# Patient Record
Sex: Male | Born: 1945 | Race: White | Hispanic: No | Marital: Married | Smoking: Never smoker
Health system: Southern US, Community
[De-identification: ages and names within clinical notes are randomized; demographics above are authoritative.]

## PROBLEM LIST (undated history)

## (undated) DIAGNOSIS — I10 Essential (primary) hypertension: Secondary | ICD-10-CM

## (undated) DIAGNOSIS — C801 Malignant (primary) neoplasm, unspecified: Secondary | ICD-10-CM

## (undated) DIAGNOSIS — E119 Type 2 diabetes mellitus without complications: Secondary | ICD-10-CM

## (undated) DIAGNOSIS — M549 Dorsalgia, unspecified: Secondary | ICD-10-CM

---

## 2012-08-20 ENCOUNTER — Emergency Department (HOSPITAL_COMMUNITY): Payer: Medicare Other

## 2012-08-20 ENCOUNTER — Encounter (HOSPITAL_COMMUNITY): Payer: Self-pay | Admitting: Emergency Medicine

## 2012-08-20 ENCOUNTER — Inpatient Hospital Stay (HOSPITAL_COMMUNITY)
Admission: EM | Admit: 2012-08-20 | Discharge: 2012-08-28 | DRG: 840 | Disposition: A | Discharge status: Home / Self Care | Payer: Medicare Other | Attending: Internal Medicine | Admitting: Internal Medicine

## 2012-08-20 DIAGNOSIS — D638 Anemia in other chronic diseases classified elsewhere: Secondary | ICD-10-CM | POA: Diagnosis present

## 2012-08-20 DIAGNOSIS — R7402 Elevation of levels of lactic acid dehydrogenase (LDH): Secondary | ICD-10-CM | POA: Diagnosis present

## 2012-08-20 DIAGNOSIS — R531 Weakness: Secondary | ICD-10-CM | POA: Diagnosis present

## 2012-08-20 DIAGNOSIS — R29898 Other symptoms and signs involving the musculoskeletal system: Secondary | ICD-10-CM | POA: Diagnosis present

## 2012-08-20 DIAGNOSIS — IMO0001 Reserved for inherently not codable concepts without codable children: Secondary | ICD-10-CM | POA: Diagnosis present

## 2012-08-20 DIAGNOSIS — R7401 Elevation of levels of liver transaminase levels: Secondary | ICD-10-CM | POA: Diagnosis present

## 2012-08-20 DIAGNOSIS — R627 Adult failure to thrive: Secondary | ICD-10-CM | POA: Diagnosis present

## 2012-08-20 DIAGNOSIS — R000 Tachycardia, unspecified: Secondary | ICD-10-CM | POA: Diagnosis present

## 2012-08-20 DIAGNOSIS — C8589 Other specified types of non-Hodgkin lymphoma, extranodal and solid organ sites: Principal | ICD-10-CM | POA: Diagnosis present

## 2012-08-20 DIAGNOSIS — I498 Other specified cardiac arrhythmias: Secondary | ICD-10-CM | POA: Diagnosis present

## 2012-08-20 DIAGNOSIS — E876 Hypokalemia: Secondary | ICD-10-CM | POA: Diagnosis not present

## 2012-08-20 DIAGNOSIS — E041 Nontoxic single thyroid nodule: Secondary | ICD-10-CM | POA: Diagnosis present

## 2012-08-20 DIAGNOSIS — E119 Type 2 diabetes mellitus without complications: Secondary | ICD-10-CM

## 2012-08-20 DIAGNOSIS — I420 Dilated cardiomyopathy: Secondary | ICD-10-CM | POA: Diagnosis present

## 2012-08-20 DIAGNOSIS — D630 Anemia in neoplastic disease: Secondary | ICD-10-CM | POA: Diagnosis present

## 2012-08-20 DIAGNOSIS — C8590 Non-Hodgkin lymphoma, unspecified, unspecified site: Secondary | ICD-10-CM

## 2012-08-20 DIAGNOSIS — I428 Other cardiomyopathies: Secondary | ICD-10-CM | POA: Diagnosis present

## 2012-08-20 DIAGNOSIS — I10 Essential (primary) hypertension: Secondary | ICD-10-CM | POA: Diagnosis present

## 2012-08-20 DIAGNOSIS — R5381 Other malaise: Secondary | ICD-10-CM | POA: Diagnosis present

## 2012-08-20 DIAGNOSIS — E871 Hypo-osmolality and hyponatremia: Secondary | ICD-10-CM | POA: Diagnosis present

## 2012-08-20 DIAGNOSIS — C799 Secondary malignant neoplasm of unspecified site: Secondary | ICD-10-CM | POA: Diagnosis present

## 2012-08-20 DIAGNOSIS — E1165 Type 2 diabetes mellitus with hyperglycemia: Secondary | ICD-10-CM | POA: Diagnosis present

## 2012-08-20 DIAGNOSIS — E43 Unspecified severe protein-calorie malnutrition: Secondary | ICD-10-CM | POA: Diagnosis present

## 2012-08-20 HISTORY — DX: Essential (primary) hypertension: I10

## 2012-08-20 HISTORY — DX: Dorsalgia, unspecified: M54.9

## 2012-08-20 HISTORY — DX: Type 2 diabetes mellitus without complications: E11.9

## 2012-08-20 LAB — CBC WITH DIFFERENTIAL/PLATELET
Basophils Absolute: 0 10*3/uL (ref 0.0–0.1)
Basophils Relative: 0 % (ref 0–1)
Eosinophils Absolute: 0 10*3/uL (ref 0.0–0.7)
Eosinophils Relative: 0 % (ref 0–5)
HCT: 30.2 % — ABNORMAL LOW (ref 39.0–52.0)
MCHC: 32.8 g/dL (ref 30.0–36.0)
MCV: 83.4 fL (ref 78.0–100.0)
Monocytes Absolute: 0.4 10*3/uL (ref 0.1–1.0)
RDW: 13.5 % (ref 11.5–15.5)

## 2012-08-20 LAB — CG4 I-STAT (LACTIC ACID): Lactic Acid, Venous: 1.88 mmol/L (ref 0.5–2.2)

## 2012-08-20 LAB — COMPREHENSIVE METABOLIC PANEL
AST: 155 U/L — ABNORMAL HIGH (ref 0–37)
Albumin: 2.1 g/dL — ABNORMAL LOW (ref 3.5–5.2)
Calcium: 9.5 mg/dL (ref 8.4–10.5)
Creatinine, Ser: 1.01 mg/dL (ref 0.50–1.35)
GFR calc non Af Amer: 76 mL/min — ABNORMAL LOW (ref >90)

## 2012-08-20 MED ORDER — GADOBENATE DIMEGLUMINE 529 MG/ML IV SOLN
15.0000 mL | Freq: Once | INTRAVENOUS | Status: AC | PRN
  Administered 2012-08-20: 15 mL via INTRAVENOUS

## 2012-08-20 MED ORDER — INSULIN ASPART 100 UNIT/ML ~~LOC~~ SOLN: 0.0000 [IU] | Freq: Three times a day (TID) | SUBCUTANEOUS | Status: DC

## 2012-08-20 MED ORDER — SODIUM CHLORIDE 0.9 % IV BOLUS (SEPSIS)
1000.0000 mL | Freq: Once | INTRAVENOUS | Status: AC
  Administered 2012-08-20: 1000 mL via INTRAVENOUS

## 2012-08-20 NOTE — ED Notes (Signed)
Patient transported to MRI 

## 2012-08-20 NOTE — Progress Notes (Signed)
   CARE MANAGEMENT ED NOTE 08/20/2012  Patient:  [facility]   Account Number:  [id number]  Date Initiated:  08/20/2012  Documentation initiated by:  Radford Pax  Subjective/Objective Assessment:   Patient presents to ED with elevated blood sygar, back pain and decreased appetite.     Subjective/Objective Assessment Detail:     Action/Plan:   Action/Plan Detail:   Anticipated DC Date:       Status Recommendation to Physician:   Result of Recommendation:    Other ED Services  Consult Working Plan    DC Planning Services  Other  PCP issues    Choice offered to / List presented to:            Status of service:  Completed, signed off  ED Comments:   ED Comments Detail:  Patient currently in MRI.  Patient's family at bedside report that Dr. Jeanie Sewer is patient's pcp.

## 2012-08-20 NOTE — ED Provider Notes (Addendum)
History    CSN: [id number] Arrival date & time 08/20/12  1904  First MD Initiated Contact with Patient 08/20/12 1926     Chief Complaint  Patient presents with  . Weakness   (Consider location/radiation/quality/duration/timing/severity/associated sxs/prior Treatment) HPI Comments: Patient states approximately one month ago he was working in the garden and developed lower back pain. The pain continued to worsen so he saw his doctor and made June and was placed on prednisone and pain medication. He states the prednisone caused her sugars to go crazy and since that time he has had difficulty controlling them. He has been back to his regular doctor approximately one to 2 times a week since that time with ongoing back pain, weight loss, generalized weakness and severe fatigue. He still has 9/10 back pain with mild weakness in the left leg. He states prior to this he was able to do everything he wanted to do and was strong and healthy and now he can't walk across the room without feeling so weak he needs to rest. He went to an outside emergency room approximately 2 weeks ago hemicolectomy was to hydrated and had problems with his kidneys and liver. At that time he had a plain film done which was unrevealing but did not have an MRI. He had an ultrasound of his liver and kidneys done yesterday which was normal but continues to have worsening symptoms and came here for further help. He states he's lost 40 pounds in the last one month with anorexia and decreased appetite as well as chills. He has a prior history of non-Hodgkin lymphoma, back surgery in the 90s and diabetes. Wife states last week he noted his heart rate started to become elevated and his chest worsened until today  Patient is a 67 y.o. male presenting with weakness. The history is provided by the patient and the spouse.  Weakness This is a new problem. Episode onset: over the last 3 weeks. The problem occurs constantly. The problem has been  gradually worsening. Pertinent negatives include no chest pain, no abdominal pain and no shortness of breath. The symptoms are aggravated by walking and bending. Nothing relieves the symptoms. He has tried rest, acetaminophen and water (meds from PCP) for the symptoms. The treatment provided no relief.   Past Medical History  Diagnosis Date  . Back pain   . Hypertension   . Diabetes mellitus without complication    History reviewed. No pertinent past surgical history. History reviewed. No pertinent family history. History  Substance Use Topics  . Smoking status: Never Smoker   . Smokeless tobacco: Not on file  . Alcohol Use: No    Review of Systems  Constitutional: Positive for chills, appetite change, fatigue and unexpected weight change. Negative for fever.  Respiratory: Negative for cough and shortness of breath.   Cardiovascular: Negative for chest pain.  Gastrointestinal: Negative for nausea, vomiting, abdominal pain and diarrhea.  Genitourinary: Negative for dysuria.  Neurological: Positive for weakness. Negative for numbness.  All other systems reviewed and are negative.    Allergies  Review of patient's allergies indicates not on file.  Home Medications  No current outpatient prescriptions on file. BP 102/76  Pulse 138  Temp(Src) 98.5 F (36.9 C) (Oral)  Resp 18  Wt 166 lb (75.297 kg)  SpO2 98% Physical Exam  Nursing note and vitals reviewed. Constitutional: He is oriented to person, place, and time. He appears well-developed. He appears cachectic. No distress.  HENT:  Head: Normocephalic and atraumatic.  Mouth/Throat: Oropharynx is clear and moist.  Eyes: Conjunctivae and EOM are normal. Pupils are equal, round, and reactive to light.  Neck: Normal range of motion. Neck supple.  Cardiovascular: Regular rhythm and intact distal pulses.  Tachycardia present.   No murmur heard. Pulmonary/Chest: Effort normal and breath sounds normal. No respiratory distress. He  has no wheezes. He has no rales.  Abdominal: Soft. He exhibits no distension. There is no tenderness. There is no rebound and no guarding.  Musculoskeletal: Normal range of motion. He exhibits tenderness. He exhibits no edema.       Lumbar back: He exhibits tenderness and pain. He exhibits no deformity, no laceration and normal pulse.       Back:  Significant point tenderness over the lower t-spine and upper l-spine  Neurological: He is alert and oriented to person, place, and time. No sensory deficit.  4/5 strength of the left lower ext compared to 5/5 on the right.    Skin: Skin is warm and dry. No rash noted. No erythema.  Psychiatric: He has a normal mood and affect. His behavior is normal.    ED Course  Procedures (including critical care time) Labs Reviewed  CBC WITH DIFFERENTIAL - Abnormal; Notable for the following:    RBC 3.62 (*)    Hemoglobin 9.9 (*)    HCT 30.2 (*)    Neutrophils Relative % 81 (*)    All other components within normal limits  COMPREHENSIVE METABOLIC PANEL - Abnormal; Notable for the following:    Sodium 133 (*)    Glucose, Bld 331 (*)    BUN 24 (*)    Albumin 2.1 (*)    AST 155 (*)    ALT 139 (*)    Alkaline Phosphatase 378 (*)    GFR calc non Af Amer 76 (*)    GFR calc Af Amer 88 (*)    All other components within normal limits  CK  URINALYSIS, ROUTINE W REFLEX MICROSCOPIC  CG4 I-STAT (LACTIC ACID)   Mr Thoracic Spine W Wo Contrast  08/20/2012   *RADIOLOGY REPORT*  Clinical Data:  Back pain  MRI THORACIC AND LUMBAR SPINE WITHOUT AND WITH CONTRAST  Technique:  Multiplanar and multiecho pulse sequences of the thoracic and lumbar spine were obtained without and with intravenous contrast.  Contrast: 15mL MULTIHANCE GADOBENATE DIMEGLUMINE 529 MG/ML IV SOLN  Comparison:   None.  MRI THORACIC SPINE  Findings: The vertebral bodies are normally aligned with preservation of the normal thoracic kyphosis.  There is no listhesis.  Vertebral body heights are  preserved.  Signal intensity throughout the visualized vertebral body bone marrow is diffusely abnormal with a mottled T1 hypointense, T2 hyperintense appearance.  There is diffuse patchy heterogeneous enhancement seen throughout all the visualized bone marrow. Findings are most consistent with diffuse osseous metastases. No epidural extension of tumor is seen. A single discrete lesion is difficult to measure and demarcated.  There are no epidural collections.  No significant canal or neural foraminal impingement is identified. No pathologic fracture.  Incidental note is made of an exophytic right renal cyst.   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  MRI LUMBAR SPINE  Findings: The vertebral bodies are normally aligned with preservation of the normal lumbar lordosis.  There is no listhesis. Vertebral body heights are preserved.  Signal intensity throughout the visualized vertebral body bone marrow is diffusely abnormal with a mild T1 hypointense, T2 hyperintense appearance.  There is diffuse patchy heterogeneous enhancement on  post contrast sequences.  Findin similar changes are seen within the bones of the pelvis.  gs are consistent with diffuse osseous metastases.  It is difficult to measure a single discrete lesion, however, enhancement is most evident within the L3 vertebral body. No epidural tumor.  No pathologic fracture.  At L5-S1, postoperative changes from prior right hemilaminectomy are seen.  There appears to be a small amount of enhancing scar tissue along the right anterolateral aspect of the thecal sac not significant canal impingement.  There is diffuse degenerative disc bulge with disc desiccation and bilateral facet arthrosis at this level. No evident recurrent disc herniation.  There is resultant moderate bilateral foraminal narrowing.  No other significant disc disease identified within the lumbar spine.  IMPRESSION:  1.  Diffuse abnormal heterogeneous signal  intensity throughout the vertebral body bone marrow with heterogeneous enhancement, worrisome for diffuse osseous metastases. 2. Postsurgical changes from prior right hemilaminectomy at L5-S1 with a small amount of enhancing scar tissue formation along the right lateral aspect of the thecal sac.  Degenerative disc disease at L5-S1 as above without recurrent focal disc herniation.  Critical Value/emergent results were called by telephone at the time of interpretation on 08/20/2012 at 10:50 p.m. to [facility], who verbally acknowledged these results.   Original Report Authenticated By: Rise Mu, M.D.   Mr Lumbar Spine W Wo Contrast  08/20/2012   *RADIOLOGY REPORT*  Clinical Data:  Back pain  MRI THORACIC AND LUMBAR SPINE WITHOUT AND WITH CONTRAST  Technique:  Multiplanar and multiecho pulse sequences of the thoracic and lumbar spine were obtained without and with intravenous contrast.  Contrast: 15mL MULTIHANCE GADOBENATE DIMEGLUMINE 529 MG/ML IV SOLN  Comparison:   None.  MRI THORACIC SPINE  Findings: The vertebral bodies are normally aligned with preservation of the normal thoracic kyphosis.  There is no listhesis.  Vertebral body heights are preserved.  Signal intensity throughout the visualized vertebral body bone marrow is diffusely abnormal with a mottled T1 hypointense, T2 hyperintense appearance.  There is diffuse patchy heterogeneous enhancement seen throughout all the visualized bone marrow. Findings are most consistent with diffuse osseous metastases. No epidural extension of tumor is seen. A single discrete lesion is difficult to measure and demarcated.  There are no epidural collections.  No significant canal or neural foraminal impingement is identified. No pathologic fracture.  Incidental note is made of an exophytic right renal cyst.   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  MRI LUMBAR SPINE  Findings: The vertebral bodies are normally aligned with  preservation of the normal lumbar lordosis.  There is no listhesis. Vertebral body heights are preserved.  Signal intensity throughout the visualized vertebral body bone marrow is diffusely abnormal with a mild T1 hypointense, T2 hyperintense appearance.  There is diffuse patchy heterogeneous enhancement on post contrast sequences.  Findin similar changes are seen within the bones of the pelvis.  gs are consistent with diffuse osseous metastases.  It is difficult to measure a single discrete lesion, however, enhancement is most evident within the L3 vertebral body. No epidural tumor.  No pathologic fracture.  At L5-S1, postoperative changes from prior right hemilaminectomy are seen.  There appears to be a small amount of enhancing scar tissue along the right anterolateral aspect of the thecal sac not significant canal impingement.  There is diffuse degenerative disc bulge with disc desiccation and bilateral facet arthrosis at this level. No evident recurrent disc herniation.  There is resultant moderate bilateral foraminal narrowing.  No other significant disc disease identified within the lumbar spine.  IMPRESSION:  1.  Diffuse abnormal heterogeneous signal intensity throughout the vertebral body bone marrow with heterogeneous enhancement, worrisome for diffuse osseous metastases. 2. Postsurgical changes from prior right hemilaminectomy at L5-S1 with a small amount of enhancing scar tissue formation along the right lateral aspect of the thecal sac.  Degenerative disc disease at L5-S1 as above without recurrent focal disc herniation.  Critical Value/emergent results were called by telephone at the time of interpretation on 08/20/2012 at 10:50 p.m. to [facility], who verbally acknowledged these results.   Original Report Authenticated By: Rise Mu, M.D.   Dg Chest Port 1 View  08/20/2012   *RADIOLOGY REPORT*  Clinical Data: Tachycardia  PORTABLE CHEST - 1 VIEW  Comparison: None.  Findings: Lungs  are clear. No pleural effusion or pneumothorax.  The heart is top normal in size.  IMPRESSION: No evidence of acute cardiopulmonary disease.   Original Report Authenticated By: Charline Bills, M.D.     Date: 08/20/2012  Rate: 122  Rhythm: sinus tachycardia  QRS Axis: normal  Intervals: normal  ST/T Wave abnormalities: normal  Conduction Disutrbances:none  Narrative Interpretation:   Old EKG Reviewed: none available   1. Weakness   2. Metastatic disease     MDM   Patient with history of diabetes who presents after back pain that started approximately one month ago and has progressively worsened however also has blood sugars have also become unstable and unable to control. He has lost 40 pounds in the last one month and states he is just not getting better. On exam he is tachycardic to 138 and mildly hypotensive pressures soft at 100. He is cachectic and ill-appearing. He states that prior to one month ago he was healthy and well and continue all of his activities and now he sleeps all the time and is unable to go out of bed. Because of patient's history of prior non-Hodgkin's lymphoma, diabetes, intermittent chills or weight loss concern for possible discitis or epidural abscess. Has also had prior instrumentation to his back years ago for a herniated disc.  Patient's blood sugars have been between 150 and 300 EKG shows sinus tachycardia. Patient will be given IV fluids. CBC, CMP, UA, CK, MRI of the T. and L-spine were ordered as patient has lower thoracic and upper lumbar tenderness. He has mild weakness in the left leg compared to the right normal sensation and no bowel or bladder complaints.  10:59 PM Labs with transaminitis and elevated alk phos.  Cr wnl and CBC wnl except for anemia with unknown prior.  Pt's MRI with evidence of diffuse bone marrow abnormalities concerning for metastatic disease.  Will admit for weakness, concern for new malignancy and further eval.  Gwyneth Sprout, MD 08/20/12 [id number]  Gwyneth Sprout, MD 08/20/12 2334

## 2012-08-20 NOTE — ED Notes (Signed)
Pt sts he is currently unable to void.  Gave pt urinal and was told to inform me when he is able.

## 2012-08-20 NOTE — ED Notes (Signed)
Pt states he urinated while at MRI but did not get a sample.  States he is unable to urinate again at this time.

## 2012-08-20 NOTE — ED Notes (Signed)
Patient has had a long history of changes in medications and back pain. The patient reports that he is not eating, "food does not taste good". The patient has had back pain and his blood sugar is up.

## 2012-08-21 ENCOUNTER — Encounter (HOSPITAL_COMMUNITY): Payer: Self-pay

## 2012-08-21 ENCOUNTER — Inpatient Hospital Stay (HOSPITAL_COMMUNITY): Payer: Medicare Other

## 2012-08-21 DIAGNOSIS — R7401 Elevation of levels of liver transaminase levels: Secondary | ICD-10-CM | POA: Diagnosis present

## 2012-08-21 DIAGNOSIS — D649 Anemia, unspecified: Secondary | ICD-10-CM

## 2012-08-21 DIAGNOSIS — E43 Unspecified severe protein-calorie malnutrition: Secondary | ICD-10-CM | POA: Diagnosis present

## 2012-08-21 DIAGNOSIS — C799 Secondary malignant neoplasm of unspecified site: Secondary | ICD-10-CM | POA: Diagnosis present

## 2012-08-21 DIAGNOSIS — D638 Anemia in other chronic diseases classified elsewhere: Secondary | ICD-10-CM | POA: Diagnosis present

## 2012-08-21 DIAGNOSIS — R531 Weakness: Secondary | ICD-10-CM | POA: Diagnosis present

## 2012-08-21 DIAGNOSIS — R599 Enlarged lymph nodes, unspecified: Secondary | ICD-10-CM

## 2012-08-21 LAB — T4, FREE: Free T4: 1.43 ng/dL (ref 0.80–1.80)

## 2012-08-21 LAB — BASIC METABOLIC PANEL
CO2: 22 mEq/L (ref 19–32)
GFR calc non Af Amer: 86 mL/min — ABNORMAL LOW (ref >90)
Glucose, Bld: 140 mg/dL — ABNORMAL HIGH (ref 70–99)
Potassium: 3.3 mEq/L — ABNORMAL LOW (ref 3.5–5.1)
Sodium: 133 mEq/L — ABNORMAL LOW (ref 135–145)

## 2012-08-21 LAB — URINALYSIS, ROUTINE W REFLEX MICROSCOPIC
Bilirubin Urine: NEGATIVE
Glucose, UA: 250 mg/dL — AB
Ketones, ur: NEGATIVE mg/dL
Specific Gravity, Urine: >1.046 — ABNORMAL HIGH (ref 1.005–1.030)
pH: 5 (ref 5.0–8.0)

## 2012-08-21 LAB — TSH: TSH: 2.694 u[IU]/mL (ref 0.350–4.500)

## 2012-08-21 LAB — URINE MICROSCOPIC-ADD ON

## 2012-08-21 LAB — CBC
Hemoglobin: 8.5 g/dL — ABNORMAL LOW (ref 13.0–17.0)
MCH: 27.3 pg (ref 26.0–34.0)
RBC: 3.11 MIL/uL — ABNORMAL LOW (ref 4.22–5.81)
WBC: 5 10*3/uL (ref 4.0–10.5)

## 2012-08-21 LAB — VITAMIN B12: Vitamin B-12: 250 pg/mL (ref 211–911)

## 2012-08-21 LAB — IRON AND TIBC
Iron: 52 ug/dL (ref 42–135)
Saturation Ratios: 27 % (ref 20–55)
TIBC: 191 ug/dL — ABNORMAL LOW (ref 215–435)

## 2012-08-21 LAB — RETICULOCYTES: Retic Ct Pct: 1.1 % (ref 0.4–3.1)

## 2012-08-21 LAB — HEMOGLOBIN A1C: Mean Plasma Glucose: 206 mg/dL — ABNORMAL HIGH (ref <117)

## 2012-08-21 LAB — GLUCOSE, CAPILLARY
Glucose-Capillary: 280 mg/dL — ABNORMAL HIGH (ref 70–99)
Glucose-Capillary: 357 mg/dL — ABNORMAL HIGH (ref 70–99)
Glucose-Capillary: 241 mg/dL — ABNORMAL HIGH (ref 70–99)

## 2012-08-21 MED ORDER — HYDROMORPHONE HCL PF 1 MG/ML IJ SOLN: 1.0000 mg | INTRAMUSCULAR | Status: DC | PRN

## 2012-08-21 MED ORDER — LOSARTAN POTASSIUM 50 MG PO TABS
50.0000 mg | ORAL_TABLET | Freq: Every day | ORAL | Status: DC
  Administered 2012-08-21 – 2012-08-28 (×8): 50 mg via ORAL
  Filled 2012-08-21 (×8): qty 1

## 2012-08-21 MED ORDER — INSULIN GLARGINE 100 UNIT/ML ~~LOC~~ SOLN
20.0000 [IU] | Freq: Every evening | SUBCUTANEOUS | Status: DC
  Administered 2012-08-21 – 2012-08-22 (×2): 20 [IU] via SUBCUTANEOUS
  Filled 2012-08-21 (×2): qty 0.2

## 2012-08-21 MED ORDER — IOHEXOL 300 MG/ML  SOLN
50.0000 mL | Freq: Once | INTRAMUSCULAR | Status: AC | PRN
  Administered 2012-08-21: 50 mL via ORAL

## 2012-08-21 MED ORDER — POTASSIUM CHLORIDE CRYS ER 20 MEQ PO TBCR
40.0000 meq | EXTENDED_RELEASE_TABLET | Freq: Once | ORAL | Status: AC
  Administered 2012-08-21: 40 meq via ORAL
  Filled 2012-08-21: qty 2

## 2012-08-21 MED ORDER — ONDANSETRON HCL 4 MG/2ML IJ SOLN: 4.0000 mg | Freq: Four times a day (QID) | INTRAMUSCULAR | Status: DC | PRN

## 2012-08-21 MED ORDER — IOHEXOL 300 MG/ML  SOLN
100.0000 mL | Freq: Once | INTRAMUSCULAR | Status: AC | PRN
  Administered 2012-08-21: 100 mL via INTRAVENOUS

## 2012-08-21 MED ORDER — ENOXAPARIN SODIUM 30 MG/0.3ML ~~LOC~~ SOLN: 30.0000 mg | SUBCUTANEOUS | Status: DC

## 2012-08-21 MED ORDER — SODIUM CHLORIDE 0.45 % IV SOLN
INTRAVENOUS | Status: AC
  Administered 2012-08-21 – 2012-08-23 (×4): via INTRAVENOUS

## 2012-08-21 MED ORDER — HYDROMORPHONE HCL PF 2 MG/ML IJ SOLN: 2.0000 mg | INTRAMUSCULAR | Status: DC | PRN

## 2012-08-21 MED ORDER — SODIUM CHLORIDE 0.9 % IV SOLN
INTRAVENOUS | Status: DC
  Administered 2012-08-21: 03:00:00 via INTRAVENOUS

## 2012-08-21 MED ORDER — INSULIN ASPART 100 UNIT/ML ~~LOC~~ SOLN
0.0000 [IU] | Freq: Three times a day (TID) | SUBCUTANEOUS | Status: DC
  Administered 2012-08-21: 5 [IU] via SUBCUTANEOUS
  Administered 2012-08-21: 9 [IU] via SUBCUTANEOUS
  Administered 2012-08-21 – 2012-08-22 (×2): 2 [IU] via SUBCUTANEOUS
  Administered 2012-08-22 (×2): 7 [IU] via SUBCUTANEOUS
  Administered 2012-08-23 (×2): 2 [IU] via SUBCUTANEOUS
  Administered 2012-08-23 – 2012-08-24 (×2): 3 [IU] via SUBCUTANEOUS
  Administered 2012-08-24: 5 [IU] via SUBCUTANEOUS
  Administered 2012-08-24: 3 [IU] via SUBCUTANEOUS
  Administered 2012-08-25: 5 [IU] via SUBCUTANEOUS
  Administered 2012-08-25 – 2012-08-26 (×2): 3 [IU] via SUBCUTANEOUS

## 2012-08-21 MED ORDER — ENSURE COMPLETE PO LIQD
237.0000 mL | Freq: Three times a day (TID) | ORAL | Status: DC
  Administered 2012-08-21 – 2012-08-27 (×15): 237 mL via ORAL

## 2012-08-21 MED ORDER — SODIUM CHLORIDE 0.9 % IJ SOLN
3.0000 mL | Freq: Two times a day (BID) | INTRAMUSCULAR | Status: DC
  Administered 2012-08-21 – 2012-08-24 (×4): 3 mL via INTRAVENOUS

## 2012-08-21 MED ORDER — ONDANSETRON HCL 4 MG PO TABS: 4.0000 mg | ORAL_TABLET | Freq: Four times a day (QID) | ORAL | Status: DC | PRN

## 2012-08-21 MED ORDER — ENOXAPARIN SODIUM 40 MG/0.4ML ~~LOC~~ SOLN
40.0000 mg | Freq: Every day | SUBCUTANEOUS | Status: DC
  Administered 2012-08-21 – 2012-08-22 (×2): 40 mg via SUBCUTANEOUS
  Filled 2012-08-21 (×2): qty 0.4

## 2012-08-21 MED ORDER — CYCLOBENZAPRINE HCL 10 MG PO TABS
10.0000 mg | ORAL_TABLET | Freq: Three times a day (TID) | ORAL | Status: DC | PRN
  Filled 2012-08-21: qty 1

## 2012-08-21 MED ORDER — DEXAMETHASONE SODIUM PHOSPHATE 4 MG/ML IJ SOLN
4.0000 mg | Freq: Four times a day (QID) | INTRAMUSCULAR | Status: DC
  Administered 2012-08-21: 4 mg via INTRAVENOUS
  Filled 2012-08-21 (×5): qty 1

## 2012-08-21 MED ORDER — HYDROCODONE-ACETAMINOPHEN 5-325 MG PO TABS
1.0000 | ORAL_TABLET | ORAL | Status: DC | PRN
  Administered 2012-08-21 (×2): 1 via ORAL
  Administered 2012-08-22 – 2012-08-23 (×5): 2 via ORAL
  Filled 2012-08-21 (×2): qty 2
  Filled 2012-08-21: qty 1
  Filled 2012-08-21: qty 2
  Filled 2012-08-21: qty 1
  Filled 2012-08-21 (×2): qty 2

## 2012-08-21 NOTE — Consult Note (Signed)
NAME:  Keith Duffy, Keith Duffy NO.:  [id number]  MEDICAL RECORD NO.:  [id number]  LOCATION:  1445                         FACILITY:  [facility]  PHYSICIAN:  Keith Duffy, M.D.  DATE OF BIRTH:  [birth year]  DATE OF CONSULTATION:  08/21/2012 DATE OF DISCHARGE:                                CONSULTATION   REASON FOR CONSULTATION: 1. History of localized non-Hodgkin lymphoma back in 1995. 2. 40-pound weight loss, spinal lesions, and lymphadenopathy.  HISTORY OF PRESENT ILLNESS:  Keith Duffy is a very nice 67 year old white gentleman.  He has a history of localized non-Hodgkin lymphoma. This apparently was back in 1995.  He had a lymph node removed from the right upper neck.  He had radiation therapy at [facility] for this. Again, he has some localized disease as he did not have any chemotherapy.  He has had diabetes for about 6 years.  He is on insulin right now.  He has lost about 40 pounds over the past 5 to 6 weeks.  He has had no energy.  He is having more bony pain.  His appetite is down.  He has had some sweats.  He denies any kind of fever.  There has been no change in bowel or bladder habits.  He has not had a colonoscopy for about 12 years.  He is to have one done in next week.  He has not noted any bleeding.  He has had no cough.  He has had no dysphagia or odynophagia.  There has been no leg swelling.  He subsequently was admitted because of weakness.  Upon admission, lab work that was done showed a white cell count 6.8, hemoglobin 9.9, hematocrit 30.2, and platelet count 283,000.  His liver function tests were elevated.  Total bilirubin was normal.  BUN was 24, creatinine 1.0. Calcium was 9.5 with an albumin of 2.1.  He subsequently had scans done.  CTA scan of the body done today actually showed a left thyroid nodule.  He had a 1.1 cm nonspecific right hepatic lobe lesion.  There was no lymphadenopathy in the chest. There is no pulmonary nodules.  He  did have a retroperitoneal lymph node measuring 1.7 x 1.8 cm.  He had a 3 cm right upper pole kidney lesion. He did have retroperitoneal lymphadenopathy with the large lymph node measuring 6 x 2 cm in the left pelvis.  He had a normal-appearing colon. He had nonspecific sclerotic lesions noted.  An MRI done on the 3rd showed numerous osseous metastasis in the spine. There is no cord compression.  No epidural tumor was appreciated.  We were called today to help manage this situation.  Overall, performance status, slight ECOG 1 to 2.  His appetite is down.  PAST MEDICAL HISTORY:  Remarkable for: 1. Hypertension. 2. Diabetes. 3. History of stage I non-Hodgkin lymphoma.  ALLERGIES: 1. Codeine. 2. Aspartame.  MEDICATIONS:  His admission medications were Flexeril 10 mg p.o. q.8 hours p.r.n., Lantus insulin 20 units subcu daily at a.m. and 20 units subcu q.p.m., and losartan 50 mg p.o. daily.  SOCIAL HISTORY:  Negative for tobacco or alcohol use.  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  He did have exposure to solvents.  FAMILY HISTORY:  Unremarkable.  PHYSICAL EXAMINATION:  GENERAL:  This is a was slightly ill-appearing, white gentleman, in no obvious distress. VITAL SIGNS:  Showed temperature of 98.8; pulse 103; respiratory rate 18; blood pressure 140/66; weight was 168. HEAD AND NECK:  Normocephalic, atraumatic skull.  There are no ocular or oral lesions.  He has no lymphadenopathy in the neck.  He does have a surgical scar on the right upper neck, passed from some lymph node resection. LUNGS:  Clear bilaterally. CARDIAC:  Tachycardia, regular.  There are no murmurs, rubs, or bruits. AXILLARY:  Shows no bilateral axillary adenopathy. ABDOMEN:  Soft and good bowel sounds.  There is no palpable abdominal mass.  There is no fluid wave.  There is no palpable hepatosplenomegaly. EXTREMITIES:  Shows good range of motion of his joints.  He has no swelling in the legs.  He  has decent strength in his legs bilaterally. SKIN:  Shows no rashes ecchymoses or petechia. NEUROLOGIC:  No focal neurological deficits.  IMPRESSION:  Keith Duffy is a 67 year old gentleman with a past history of stage I non-Hodgkin lymphoma diagnosed back in 1995.  One would have to suspect that he may have a recurrence, although will be unusual to have this 18 years out.  I suppose that he may have a second kind of lymphoma.  Solid tumors also possibility by the appearance of the MRI.  The issue now is try to get a biopsy.  He does have this large left retroperitoneal lymph node.  Hopefully, Radiology can get a core biopsy for [place].  If not, I think we are going to need surgery to do an open procedure.  I would not get a bone biopsy and I assume we can get what we need tissue wise to make a diagnosis.  He also needs to have a bone marrow biopsy done.  He is anemic.  He does not have renal insufficiency.  I will see anything that would suggest hemolysis.  We are sending off an LDH on him.  I will also get a reticulocyte count on him.  We have no blood smears made, so we will have to see about a blood smear be made.  It is possible that he may need to be transfused depending on how his blood count trends over the weekend.  I do not think that we need to do any additional x-ray test on him right now and so that he has an MRI of the brain ordered.  This is okay from my point of view.  I spoke to Keith Duffy, his wife, and friend.  I explained to them the situation.  They understand well and what needs to be done.  Keith Duffy's certainly looks a bit little un-nourished.  We will check a prealbumin on him.  I do not worry about blood sugars.  He really needs to get calories in right now.  I think anything that he wants is should be able to eat and his blood sugars can be managed with insulin.  We will follow up Keith Duffy along wife in hospital.  I anticipate that he  will be in hospital for another week or so, so that we can get all of our testing done.  Unfortunately, nothing will be done over the holiday weekend, so we have to get our test set up for next week.     Keith Duffy, M.D.  PRE/MEDQ  D:  08/21/2012  T:  08/21/2012  Job:  [id number]

## 2012-08-21 NOTE — Consult Note (Signed)
#   [id number] is consult note!!  Keith E.  [name]

## 2012-08-21 NOTE — H&P (Addendum)
[facility] History and Physical  Muhammed Teutsch ZOX:[id number] DOB: 12/15/1945 DOA: 08/20/2012  Referring physician: ED physician PCP: [name], MD   Chief Complaint: Generalized weakness   HPI:  Pt is 67 yo male who presents to [facility] ED with main concern of progressively worsening generalized weakness, that he initially noted several months prior to this admission, associated with lower back pain, constant and sharp, 10/10 in severity with no specific alleviating factors or aggravating factors. He explains his PCP started him on Prednisone and his sugar levels have been rather difficult to control. He explains he has lost 40 lbs over that past month and has not had much of an appetite. He says he has had US of the liver and kidney done yesterday and was told no acut findings noted. He has prior history of non -Hodgkin lymphoma in 35's but has been stable since with no medical problems. Pt also now has left lower extremity weakness to a point where he has to lift his leg up to move it.  In ED, pt had MRI done of the lumbar and thoracic spine and findings worrisome for metastatic disease of unknown primary. [facility] asked to admit for further evaluation.   Assessment and Plan:  Principal Problem:   Generalized weakness with mostly left lower extremity weakness - this is likely secondary to failure to thrive in the setting of chronic and progressive illness, worrisome for metastatic diseaase - will admit to telemetry bed for further evaluation - will provide supportive care with IVF, analgesia and antiemetics as needed - PT evaluation prior to discharge  - will start on decadron for now given specific focal neurological weakness, radiation oncology consult  Active Problems:   Metastatic disease of unknown primary - will proceed with CT chest and abd/pelvis for further evaluation   Transaminitis - ? Liver metastatic disease - CT abd/pelvis for further evaluation    Diabetes -  check A1C, continue home regimen with Lantus and add SSI   Anemia of chronic disease - anemia panel ordered - CBC in AM  Code Status: Full Family Communication: Pt at bedside Disposition Plan: Admit to telemetry floor    Review of Systems:  Constitutional: Positive for malaise/fatigue. Negative for diaphoresis.  HENT: Negative for hearing loss, ear pain, nosebleeds, congestion, sore throat, neck pain, tinnitus and ear discharge.   Eyes: Negative for blurred vision, double vision, photophobia, pain, discharge and redness.  Respiratory: Negative for cough, hemoptysis, sputum production, shortness of breath, wheezing and stridor.   Cardiovascular: Negative for chest pain, palpitations, orthopnea, claudication and leg swelling.  Gastrointestinal: Positive for nausea, and abdominal pain. Negative for heartburn, constipation, blood in stool and melena.  Genitourinary: Negative for dysuria, urgency, frequency, hematuria and flank pain.  Musculoskeletal: Positive for myalgias, back pain.  Skin: Negative for itching and rash.  Neurological: Negative for tingling, tremors, speech change, loss of consciousness and headaches.  Endo/Heme/Allergies: Negative for environmental allergies and polydipsia. Does not bruise/bleed easily.  Psychiatric/Behavioral: Negative for suicidal ideas. The patient is not nervous/anxious.      Past Medical History  Diagnosis Date  . Back pain   . Hypertension   . Diabetes mellitus without complication     History reviewed. No pertinent past surgical history.  Social History:  reports that he has never smoked. He does not have any smokeless tobacco history on file. He reports that he does not drink alcohol or use illicit drugs.  Allergies  Allergen Reactions  . Aspartame And Phenylalanine Diarrhea  .  Codeine     No family history of cancers   Prior to Admission medications   Medication Sig Start Date End Date Taking? Authorizing Provider  cyclobenzaprine  (FLEXERIL) 10 MG tablet Take 10 mg by mouth 3 (three) times daily as needed for muscle spasms.   Yes Historical Provider, MD  Insulin Glargine (LANTUS SOLOSTAR) 100 UNIT/ML SOPN Inject 20 Units into the skin every evening.   Yes Historical Provider, MD  losartan (COZAAR) 50 MG tablet Take 50 mg by mouth daily.   Yes Historical Provider, MD  oxycodone (OXY-IR) 5 MG capsule Take 5 mg by mouth every 6 (six) hours as needed for pain.   Yes Historical Provider, MD    Physical Exam: Filed Vitals:   08/20/12 1908 08/20/12 1959 08/20/12 2306  BP: 102/76 134/65 142/82  Pulse: 138 118 103  Temp: 98.5 F (36.9 C)    TempSrc: Oral    Resp: 18 13 17   Weight: 75.297 kg (166 lb)    SpO2: 98% 97%     Physical Exam  Constitutional: Appears frail and chronically ill HENT: Normocephalic. External right and left ear normal. Dry MM Eyes: Conjunctivae and EOM are normal. PERRLA, no scleral icterus.  Neck: Normal ROM. Neck supple. No JVD. No tracheal deviation. No thyromegaly.  CVS: Regular rhythm, tachycardic, S1/S2 +, no murmurs, no gallops, no carotid bruit.  Pulmonary: Effort and breath sounds normal, decreased breath sounds at bases  Abdominal: Soft. BS +,  no distension, tenderness in epigastric area, no rebound or guarding.  Musculoskeletal: Normal range of motion. No edema and no tenderness.  Lymphadenopathy: No lymphadenopathy noted, cervical, inguinal. Neuro: Alert. Left lower extremity strength 1/5, on the right side 4/5 Skin: Skin is warm and dry. No rash noted. Not diaphoretic. No erythema. No pallor.  Psychiatric: Normal mood and affect.   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   MRI LUMBAR SPINE   1.  Diffuse abnormal heterogeneous signal intensity throughout the vertebral body bone marrow with heterogeneous enhancement, worrisome for diffuse osseous metastases.  2.  Postsurgical changes from prior right hemilaminectomy at L5-S1 with a small amount of enhancing scar tissue formation along the right lateral aspect of the thecal sac.   3.  Degenerative disc disease at L5-S1 as above without recurrent focal disc herniation.   Dg Chest Port 1 View 08/20/2012   No evidence of acute cardiopulmonary disease.   EKG: Normal sinus rhythm, no ST/T wave changes  Debbora Presto, MD  [facility] Pager [phone]  If 7PM-7AM, please contact night-coverage [email or web address] Password [facility] 08/21/2012, 12:17 AM

## 2012-08-21 NOTE — Progress Notes (Signed)
Agree to previous shifts assessment. 

## 2012-08-21 NOTE — Progress Notes (Signed)
[facility] PROGRESS NOTE  Keith Duffy UEA:[id number] DOB: 1945-06-27 DOA: 08/20/2012  PCP: [name], MD  Brief HPI: Pt is 67 yo male who presented to [facility] ED with main concern of progressively worsening generalized weakness, that he initially noted several months prior to this admission, associated with lower back pain, constant and sharp, 10/10 in severity with no specific alleviating factors or aggravating factors. He explained his PCP started him on Prednisone and his sugar levels have been rather difficult to control. He explained he has lost 40 lbs over that past month and has not had much of an appetite. He stated he has had US of the liver and kidney done the day prior to admission and was told no acut findings noted. He had prior history of non -Hodgkin lymphoma in 1995 treated at [facility] but has been stable since. Pt also complained of left lower extremity weakness to a point where he has to lift his leg up to move it. In ED, pt had MRI done of the lumbar and thoracic spine and findings worrisome for metastatic disease of unknown primary. [facility] asked to admit for further evaluation.   Past medical history:  Past Medical History  Diagnosis Date  . Back pain   . Hypertension   . Diabetes mellitus without complication    Consultants: Discussed with Dr. [name]  Procedures: None  Antibiotics: None  Subjective: Patient says pain is now 2/10 in the back. Also feels as if his left arm is weak as well. Denies headache or vision changes. Denies neck pain.   Objective: Vital Signs  Filed Vitals:   08/20/12 1959 08/20/12 2306 08/21/12 0244 08/21/12 0455  BP: 134/65 142/82 150/72 140/66  Pulse: 118 103 103 103  Temp:   98.3 F (36.8 C) 98.8 F (37.1 C)  TempSrc:   Oral Oral  Resp: 13 17 18 18   Height:   5\' 9"  (1.753 m)   Weight:   76.6 kg (168 lb 14 oz)   SpO2: 97%  97% 97%    Intake/Output Summary (Last 24 hours) at 08/21/12 0839 Last data filed at 08/21/12 4782   Gross per 24 hour  Intake  537.5 ml  Output      0 ml  Net  537.5 ml   Filed Weights   08/20/12 1908 08/21/12 0244  Weight: 75.297 kg (166 lb) 76.6 kg (168 lb 14 oz)   General appearance: alert, cooperative, appears stated age and no distress Head: Normocephalic, without obvious abnormality, atraumatic Eyes: conjunctivae/corneas clear. PERRL, EOM's intact. Throat: lips, mucosa, and tongue normal; teeth and gums normal Neck: no adenopathy, no carotid bruit, no JVD, supple, symmetrical, trachea midline and thyroid not enlarged, symmetric, no tenderness/mass/nodules Resp: clear to auscultation bilaterally Cardio: regular rate and rhythm, S1, S2 normal, no murmur, click, rub or gallop GI: soft, non-tender; bowel sounds normal; no masses,  no organomegaly Extremities: extremities normal, atraumatic, no cyanosis or edema Pulses: 2+ and symmetric Skin: Skin color, texture, turgor normal. No rashes or lesions Lymph nodes: Cervical, supraclavicular, and axillary nodes normal. Neurologic: Alert and oriented x 3. No cranial nerve deficits. Motor strength was equal BL UE. 3-4/5 in LLE. SLR was negative.   Lab Results:  Basic Metabolic Panel:  Recent Labs Lab 08/20/12 2000 08/21/12 0440  NA 133* 133*  K 3.7 3.3*  CL 97 100  CO2 22 22  GLUCOSE 331* 140*  BUN 24* 19  CREATININE 1.01 0.93  CALCIUM 9.5 9.3   Liver Function Tests:  Recent Labs Lab 08/20/12 2000  AST 155*  ALT 139*  ALKPHOS 378*  BILITOT 0.4  PROT 6.8  ALBUMIN 2.1*   No results found for this basename: LIPASE, AMYLASE,  in the last 168 hours No results found for this basename: AMMONIA,  in the last 168 hours CBC:  Recent Labs Lab 08/20/12 2000 08/21/12 0440  WBC 6.8 5.0  NEUTROABS 5.5  --   HGB 9.9* 8.5*  HCT 30.2* 25.8*  MCV 83.4 83.0  PLT 283 233   Cardiac Enzymes:  Recent Labs Lab 08/20/12 2000  CKTOTAL 27   BNP (last 3 results) No results found for this basename: PROBNP,  in the last 8760  hours CBG:  Recent Labs Lab 08/21/12 0717  GLUCAP 152*    No results found for this or any previous visit (from the past 240 hour(s)).    Studies/Results: Ct Chest W Contrast  08/21/2012   *RADIOLOGY REPORT*  Clinical Data:  Malignancy.  CT CHEST, ABDOMEN AND PELVIS WITH CONTRAST  Technique:  Multidetector CT imaging of the chest, abdomen and pelvis was performed following the standard protocol during bolus administration of intravenous contrast.  Contrast: OMNIPAQUE IOHEXOL 300 MG/ML  SOLN  Comparison:   None.  CT CHEST  Findings:  Nodule on the left thyroid lobe measures 23 mm.  The appearance on CT is nonspecific. There is no axillary adenopathy. No mediastinal or hilar adenopathy.  Three-vessel aortic arch. Aorta and branch vessels appear within normal limits.  Grossly, the pulmonary arteries are within normal limits.  No effusion.  The lungs demonstrate scattered areas of atelectasis.  No pulmonary mass lesion.  The central airways are patent.  IMPRESSION:  1.  23 mm left thyroid lobe nodule. Consider further evaluation with thyroid ultrasound.  If patient is clinically hyperthyroid, consider nuclear medicine thyroid uptake and scan. 2.  No evidence of primarily density in the chest.  CT ABDOMEN AND PELVIS  Findings:  Liver:  Nonspecific 11 mm low density lesion is present in the right hepatic lobe (image number 46 series 2).  Other small areas of decreased attenuation are present in the right hepatic lobe, too small to characterize.  All of these lesions are indeterminant on today's study.  Spleen:  Normal.  Gallbladder:  Contracted.  Common bile duct:  Normal.  Pancreas:  Normal.  Adrenal glands:  Right adrenal gland normal.  The left adrenal gland appears normal however there is a nodule at the genu head is inseparable from the gland itself.  This nodule probably represents a retroperitoneal lymph node and measures 17 mm x 18 mm with surrounding inflammatory changes.  Kidneys:  Exophytic  right upper pole simple renal cyst is present that measures 3 cm.  Left upper pole nonobstructing renal collecting system calculus measures 5 mm.  Normal renal enhancement and delayed excretion.  Both ureters appear within normal limits.  Retroperitoneal adenopathy is present.  The largest lymph node measures 6 cm AP by 2 cm transverse (image number 65 series 2). Other smaller periaortic lymph nodes are present.  Stomach:  No mass lesion.  Grossly normal.  Small bowel:  Normal appearance of duodenum.  Small bowel is decompressed.  No obstruction.  There is no mesenteric adenopathy. Mesenteric vasculature appears normal.  Colon:   Normal appendix.  Colonic diverticulosis.  No discrete mass lesion is identified.  Diverticular hypertrophy of the sigmoid.  Pelvic Genitourinary:  Prostatomegaly.  Eccentrically enlarged prostate gland to the right.  Bones:  No pathologic fracture  is identified.  The marrow infiltration seen on MRI is poorly appreciated on CT.  There is some heterogeneous mineralization but no focal destructive osseous lesion.  Hip osteoarthritis is present.  No pathologic fracture is identified.  Nonspecific sclerotic lesion in the medial left iliac bone noted. Potentially this represents a bone island.  Vasculature: Atherosclerosis.  No acute vascular abnormality.  No aneurysm.  Body Wall: Within normal limits.  IMPRESSION: 1.  Retroperitoneal adenopathy.  Although nonspecific, this probably represents lymphoma. Metastatic lymphadenopathy is also possible.  Largest index node is interposed between the aorta and the left kidney measuring 6 cm x 2 cm. 2.  Indeterminant small low-density right hepatic lobe lesions. Metastatic disease is impossible to exclude. 3.  Right renal cyst. 4.  5 mm nonobstructing left upper pole renal collecting system calculus.   Original Report Authenticated By: Andreas Newport, M.D.   Mr Thoracic Spine W Wo Contrast  08/20/2012   *RADIOLOGY REPORT*  Clinical Data:  Back pain   MRI THORACIC AND LUMBAR SPINE WITHOUT AND WITH CONTRAST  Technique:  Multiplanar and multiecho pulse sequences of the thoracic and lumbar spine were obtained without and with intravenous contrast.  Contrast: 15mL MULTIHANCE GADOBENATE DIMEGLUMINE 529 MG/ML IV SOLN  Comparison:   None.  MRI THORACIC SPINE  Findings: The vertebral bodies are normally aligned with preservation of the normal thoracic kyphosis.  There is no listhesis.  Vertebral body heights are preserved.  Signal intensity throughout the visualized vertebral body bone marrow is diffusely abnormal with a mottled T1 hypointense, T2 hyperintense appearance.  There is diffuse patchy heterogeneous enhancement seen throughout all the visualized bone marrow. Findings are most consistent with diffuse osseous metastases. No epidural extension of tumor is seen. A single discrete lesion is difficult to measure and demarcated.  There are no epidural collections.  No significant canal or neural foraminal impingement is identified. No pathologic fracture.  Incidental note is made of an exophytic right renal cyst.   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  MRI LUMBAR SPINE  Findings: The vertebral bodies are normally aligned with preservation of the normal lumbar lordosis.  There is no listhesis. Vertebral body heights are preserved.  Signal intensity throughout the visualized vertebral body bone marrow is diffusely abnormal with a mild T1 hypointense, T2 hyperintense appearance.  There is diffuse patchy heterogeneous enhancement on post contrast sequences.  Findin similar changes are seen within the bones of the pelvis.  gs are consistent with diffuse osseous metastases.  It is difficult to measure a single discrete lesion, however, enhancement is most evident within the L3 vertebral body. No epidural tumor.  No pathologic fracture.  At L5-S1, postoperative changes from prior right hemilaminectomy are seen.  There appears to be a small  amount of enhancing scar tissue along the right anterolateral aspect of the thecal sac not significant canal impingement.  There is diffuse degenerative disc bulge with disc desiccation and bilateral facet arthrosis at this level. No evident recurrent disc herniation.  There is resultant moderate bilateral foraminal narrowing.  No other significant disc disease identified within the lumbar spine.  IMPRESSION:  1.  Diffuse abnormal heterogeneous signal intensity throughout the vertebral body bone marrow with heterogeneous enhancement, worrisome for diffuse osseous metastases. 2. Postsurgical changes from prior right hemilaminectomy at L5-S1 with a small amount of enhancing scar tissue formation along the right lateral aspect of the thecal sac.  Degenerative disc disease at L5-S1 as above without recurrent focal disc herniation.  Critical Value/emergent  results were called by telephone at the time of interpretation on 08/20/2012 at 10:50 p.m. to [facility], who verbally acknowledged these results.   Original Report Authenticated By: Rise Mu, M.D.   Mr Lumbar Spine W Wo Contrast  08/20/2012   *RADIOLOGY REPORT*  Clinical Data:  Back pain  MRI THORACIC AND LUMBAR SPINE WITHOUT AND WITH CONTRAST  Technique:  Multiplanar and multiecho pulse sequences of the thoracic and lumbar spine were obtained without and with intravenous contrast.  Contrast: 15mL MULTIHANCE GADOBENATE DIMEGLUMINE 529 MG/ML IV SOLN  Comparison:   None.  MRI THORACIC SPINE  Findings: The vertebral bodies are normally aligned with preservation of the normal thoracic kyphosis.  There is no listhesis.  Vertebral body heights are preserved.  Signal intensity throughout the visualized vertebral body bone marrow is diffusely abnormal with a mottled T1 hypointense, T2 hyperintense appearance.  There is diffuse patchy heterogeneous enhancement seen throughout all the visualized bone marrow. Findings are most consistent with diffuse osseous  metastases. No epidural extension of tumor is seen. A single discrete lesion is difficult to measure and demarcated.  There are no epidural collections.  No significant canal or neural foraminal impingement is identified. No pathologic fracture.  Incidental note is made of an exophytic right renal cyst.   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  MRI LUMBAR SPINE  Findings: The vertebral bodies are normally aligned with preservation of the normal lumbar lordosis.  There is no listhesis. Vertebral body heights are preserved.  Signal intensity throughout the visualized vertebral body bone marrow is diffusely abnormal with a mild T1 hypointense, T2 hyperintense appearance.  There is diffuse patchy heterogeneous enhancement on post contrast sequences.  Findin similar changes are seen within the bones of the pelvis.  gs are consistent with diffuse osseous metastases.  It is difficult to measure a single discrete lesion, however, enhancement is most evident within the L3 vertebral body. No epidural tumor.  No pathologic fracture.  At L5-S1, postoperative changes from prior right hemilaminectomy are seen.  There appears to be a small amount of enhancing scar tissue along the right anterolateral aspect of the thecal sac not significant canal impingement.  There is diffuse degenerative disc bulge with disc desiccation and bilateral facet arthrosis at this level. No evident recurrent disc herniation.  There is resultant moderate bilateral foraminal narrowing.  No other significant disc disease identified within the lumbar spine.  IMPRESSION:  1.  Diffuse abnormal heterogeneous signal intensity throughout the vertebral body bone marrow with heterogeneous enhancement, worrisome for diffuse osseous metastases. 2. Postsurgical changes from prior right hemilaminectomy at L5-S1 with a small amount of enhancing scar tissue formation along the right lateral aspect of the thecal sac.  Degenerative disc  disease at L5-S1 as above without recurrent focal disc herniation.  Critical Value/emergent results were called by telephone at the time of interpretation on 08/20/2012 at 10:50 p.m. to [facility], who verbally acknowledged these results.   Original Report Authenticated By: Rise Mu, M.D.   Ct Abdomen Pelvis W Contrast  08/21/2012   *RADIOLOGY REPORT*  Clinical Data:  Malignancy.  CT CHEST, ABDOMEN AND PELVIS WITH CONTRAST  Technique:  Multidetector CT imaging of the chest, abdomen and pelvis was performed following the standard protocol during bolus administration of intravenous contrast.  Contrast: OMNIPAQUE IOHEXOL 300 MG/ML  SOLN  Comparison:   None.  CT CHEST  Findings:  Nodule on the left thyroid lobe measures 23 mm.  The appearance on CT is  nonspecific. There is no axillary adenopathy. No mediastinal or hilar adenopathy.  Three-vessel aortic arch. Aorta and branch vessels appear within normal limits.  Grossly, the pulmonary arteries are within normal limits.  No effusion.  The lungs demonstrate scattered areas of atelectasis.  No pulmonary mass lesion.  The central airways are patent.  IMPRESSION:  1.  23 mm left thyroid lobe nodule. Consider further evaluation with thyroid ultrasound.  If patient is clinically hyperthyroid, consider nuclear medicine thyroid uptake and scan. 2.  No evidence of primarily density in the chest.  CT ABDOMEN AND PELVIS  Findings:  Liver:  Nonspecific 11 mm low density lesion is present in the right hepatic lobe (image number 46 series 2).  Other small areas of decreased attenuation are present in the right hepatic lobe, too small to characterize.  All of these lesions are indeterminant on today's study.  Spleen:  Normal.  Gallbladder:  Contracted.  Common bile duct:  Normal.  Pancreas:  Normal.  Adrenal glands:  Right adrenal gland normal.  The left adrenal gland appears normal however there is a nodule at the genu head is inseparable from the gland  itself.  This nodule probably represents a retroperitoneal lymph node and measures 17 mm x 18 mm with surrounding inflammatory changes.  Kidneys:  Exophytic right upper pole simple renal cyst is present that measures 3 cm.  Left upper pole nonobstructing renal collecting system calculus measures 5 mm.  Normal renal enhancement and delayed excretion.  Both ureters appear within normal limits.  Retroperitoneal adenopathy is present.  The largest lymph node measures 6 cm AP by 2 cm transverse (image number 65 series 2). Other smaller periaortic lymph nodes are present.  Stomach:  No mass lesion.  Grossly normal.  Small bowel:  Normal appearance of duodenum.  Small bowel is decompressed.  No obstruction.  There is no mesenteric adenopathy. Mesenteric vasculature appears normal.  Colon:   Normal appendix.  Colonic diverticulosis.  No discrete mass lesion is identified.  Diverticular hypertrophy of the sigmoid.  Pelvic Genitourinary:  Prostatomegaly.  Eccentrically enlarged prostate gland to the right.  Bones:  No pathologic fracture is identified.  The marrow infiltration seen on MRI is poorly appreciated on CT.  There is some heterogeneous mineralization but no focal destructive osseous lesion.  Hip osteoarthritis is present.  No pathologic fracture is identified.  Nonspecific sclerotic lesion in the medial left iliac bone noted. Potentially this represents a bone island.  Vasculature: Atherosclerosis.  No acute vascular abnormality.  No aneurysm.  Body Wall: Within normal limits.  IMPRESSION: 1.  Retroperitoneal adenopathy.  Although nonspecific, this probably represents lymphoma. Metastatic lymphadenopathy is also possible.  Largest index node is interposed between the aorta and the left kidney measuring 6 cm x 2 cm. 2.  Indeterminant small low-density right hepatic lobe lesions. Metastatic disease is impossible to exclude. 3.  Right renal cyst. 4.  5 mm nonobstructing left upper pole renal collecting system calculus.    Original Report Authenticated By: Andreas Newport, M.D.   Dg Chest Port 1 View  08/20/2012   *RADIOLOGY REPORT*  Clinical Data: Tachycardia  PORTABLE CHEST - 1 VIEW  Comparison: None.  Findings: Lungs are clear. No pleural effusion or pneumothorax.  The heart is top normal in size.  IMPRESSION: No evidence of acute cardiopulmonary disease.   Original Report Authenticated By: Charline Bills, M.D.    Medications:  Scheduled: . sodium chloride   Intravenous STAT  . dexamethasone  4 mg Intravenous Q6H  .  enoxaparin (LOVENOX) injection  40 mg Subcutaneous Daily  . insulin aspart  0-9 Units Subcutaneous TID WC  . insulin glargine  20 Units Subcutaneous QPM  . losartan  50 mg Oral Daily  . sodium chloride  3 mL Intravenous Q12H   Continuous:  OZH:[id number], HYDROcodone-acetaminophen, HYDROmorphone (DILAUDID) injection, ondansetron (ZOFRAN) IV, ondansetron  Assessment/Plan:  Principal Problem:   Generalized weakness Active Problems:   Anemia of chronic disease   Transaminitis   Diabetes    Generalized weakness with mostly left lower extremity weakness  This is likely secondary to failure to thrive in the setting of widely metastatic disease. No evidence for cord compression. Will proceed with MRi Brain considering weakness. PT/OT. Pain control.   Metastatic disease of unknown primary  Reviewed all imaging reports. Check TSH/FT4. Have discussed with Oncology and they will consult. Will likely need repeat biopsy. Will obtain reports from [facility] regarding previous diagnosis. Continue steroids for now as he has been on it for a while, though it could alter tissue diagnosis.  Transaminitis  Possibly due to Liver metastatic disease. Monitor.  Diabetes Mellitus 2 Made worse by steroids. A1c is pending. Continue SSI and Lantus.   Anemia of chronic disease  Anemia panel pending. Transfuse as needed. No active bleeding noted.  History of HTN Monitor BP. Continue Losartan. Replete  K.  Code Status:  Full Code DVT Prophylaxis:   Enoxaprin Family Communication:  Discussed with patient and wife Disposition Plan: Pending further evaluation.    LOS: 1 day   [facility]  [facility] Pager [phone] 08/21/2012, 8:39 AM  If 8PM-8AM, please contact night-coverage at [email or web address], password [facility]

## 2012-08-21 NOTE — Progress Notes (Signed)
Request faxed to [facility] for clinic notes,radiologic images H&P, progress and discharge notes from 45.

## 2012-08-21 NOTE — Progress Notes (Signed)
Rx Brief Lovenox note Pt = 75 kg CrCl~75 (N) Adjusted Lovenox to 40 mg daily  Lorenza Evangelist 08/21/2012 12:32 AM

## 2012-08-21 NOTE — Progress Notes (Signed)
INITIAL NUTRITION ASSESSMENT  Patient meets criteria for severe malnutrition related to chronic illness AEB severe loss of body fat and muscle mass, 20% weight loss in the last 6 weeks and intake <75% for > 1 month.  DOCUMENTATION CODES Per approved criteria  -Severe malnutrition in the context of chronic illness   INTERVENTION: Continue liberal diet with food from home as desired.   Encouraged intake of meals and snacks. Ensure complete tid   NUTRITION DIAGNOSIS: Inadequate oral intake related to decreased appetite and early satiety as evidenced by patient report.   Goal: Patient to meet >90% estimated needs with meals, snacks and supplements.  Monitor:  Intake, labs, weight  Reason for Assessment: MST  67 y.o. male  Admitting Dx: Generalized weakness  ASSESSMENT: Patient with a hx of non-Hodgkin lymphoma in 1995.  Now with 40 lb weight loss, spinal lesion, and lymphadenopathy.  Spoke with patient and wife.  Patient with decreased appetite and intake for 6 weeks.  Uncontrolled DM and back pain during this time.  Complains of early satiety.  Patient with a 20%weight loss in the last 6 weeks.  Decrease in muscle mass and body fat.  Patient meets criteria for severe malnutrition.  Nutrition Focused Physical Exam:  Subcutaneous Fat:  Orbital Region: wnl Upper Arm Region: moderate Thoracic and Lumbar Region: mild  Muscle:  Temple Region: mild Clavicle Bone Region: moderate Clavicle and Acromion Bone Region: mild Scapular Bone Region: severe Dorsal Hand: wnl Patellar Region: moderate Anterior Thigh Region: moderate Posterior Calf Region: severe  Edema: none    Height: Ht Readings from Last 1 Encounters:  08/21/12 5\' 9"  (1.753 m)    Weight: Wt Readings from Last 1 Encounters:  08/21/12 168 lb 14 oz (76.6 kg)    Ideal Body Weight:  160 lbs  % Ideal Body Weight: 105  Wt Readings from Last 10 Encounters:  08/21/12 168 lb 14 oz (76.6 kg)    Usual Body  Weight: 210 lbs  % Usual Body Weight: 80  BMI:  Body mass index is 24.93 kg/(m^2).  Estimated Nutritional Needs: Kcal: 2200-2400 Protein: 90-100 Fluid: >2.2L  Skin: wnl  Diet Order: General  EDUCATION NEEDS: -Education needs addressed   Intake/Output Summary (Last 24 hours) at 08/21/12 1355 Last data filed at 08/21/12 0907  Gross per 24 hour  Intake  857.5 ml  Output      0 ml  Net  857.5 ml     Labs:   Recent Labs Lab 08/20/12 2000 08/21/12 0440  NA 133* 133*  K 3.7 3.3*  CL 97 100  CO2 22 22  BUN 24* 19  CREATININE 1.01 0.93  CALCIUM 9.5 9.3  GLUCOSE 331* 140*    CBG (last 3)   Recent Labs  08/21/12 0717 08/21/12 1142  GLUCAP 152* 280*    Scheduled Meds: . enoxaparin (LOVENOX) injection  40 mg Subcutaneous Daily  . insulin aspart  0-9 Units Subcutaneous TID WC  . insulin glargine  20 Units Subcutaneous QPM  . losartan  50 mg Oral Daily  . sodium chloride  3 mL Intravenous Q12H    Continuous Infusions: . sodium chloride 100 mL/hr at 08/21/12 1039    Past Medical History  Diagnosis Date  . Back pain   . Hypertension   . Diabetes mellitus without complication     History reviewed. No pertinent past surgical history.  Oran Rein, RD, LDN Clinical Inpatient Dietitian Pager:  [phone] Weekend and after hours pager:  [phone]

## 2012-08-21 NOTE — ED Notes (Signed)
Report called to the floor. The patient is calm and cooperative at this time. The patient is without any further complaints

## 2012-08-22 ENCOUNTER — Encounter (HOSPITAL_COMMUNITY): Payer: Self-pay | Admitting: Radiology

## 2012-08-22 DIAGNOSIS — D638 Anemia in other chronic diseases classified elsewhere: Secondary | ICD-10-CM

## 2012-08-22 DIAGNOSIS — C801 Malignant (primary) neoplasm, unspecified: Secondary | ICD-10-CM

## 2012-08-22 DIAGNOSIS — E1165 Type 2 diabetes mellitus with hyperglycemia: Secondary | ICD-10-CM | POA: Diagnosis present

## 2012-08-22 DIAGNOSIS — R5381 Other malaise: Secondary | ICD-10-CM

## 2012-08-22 DIAGNOSIS — E119 Type 2 diabetes mellitus without complications: Secondary | ICD-10-CM

## 2012-08-22 LAB — APTT: aPTT: 35 seconds (ref 24–37)

## 2012-08-22 LAB — COMPREHENSIVE METABOLIC PANEL
ALT: 84 U/L — ABNORMAL HIGH (ref 0–53)
Albumin: 2 g/dL — ABNORMAL LOW (ref 3.5–5.2)
Calcium: 9.3 mg/dL (ref 8.4–10.5)
GFR calc Af Amer: >90 mL/min (ref >90)
Glucose, Bld: 198 mg/dL — ABNORMAL HIGH (ref 70–99)
Sodium: 132 mEq/L — ABNORMAL LOW (ref 135–145)
Total Protein: 6.3 g/dL (ref 6.0–8.3)

## 2012-08-22 LAB — GLUCOSE, CAPILLARY
Glucose-Capillary: 329 mg/dL — ABNORMAL HIGH (ref 70–99)
Glucose-Capillary: 309 mg/dL — ABNORMAL HIGH (ref 70–99)

## 2012-08-22 LAB — CBC
Hemoglobin: 9.5 g/dL — ABNORMAL LOW (ref 13.0–17.0)
MCH: 27.4 pg (ref 26.0–34.0)
MCHC: 33.7 g/dL (ref 30.0–36.0)
Platelets: 241 10*3/uL (ref 150–400)
RDW: 13.3 % (ref 11.5–15.5)

## 2012-08-22 MED ORDER — ENOXAPARIN SODIUM 40 MG/0.4ML ~~LOC~~ SOLN
40.0000 mg | Freq: Every day | SUBCUTANEOUS | Status: AC
  Filled 2012-08-22: qty 0.4

## 2012-08-22 MED ORDER — ENOXAPARIN SODIUM 40 MG/0.4ML ~~LOC~~ SOLN
40.0000 mg | SUBCUTANEOUS | Status: DC
  Administered 2012-08-25 – 2012-08-28 (×4): 40 mg via SUBCUTANEOUS
  Filled 2012-08-22 (×4): qty 0.4

## 2012-08-22 NOTE — Progress Notes (Signed)
[facility] PROGRESS NOTE  Keith Duffy ZOX:[id number] DOB: 01/04/1946 DOA: 08/20/2012  PCP: [name], MD  Brief HPI: Pt is 67 yo male who presented to [facility] ED with main concern of progressively worsening generalized weakness, that he initially noted several months prior to this admission, associated with lower back pain, constant and sharp, 10/10 in severity with no specific alleviating factors or aggravating factors. He explained his PCP started him on Prednisone and his sugar levels have been rather difficult to control. He explained he has lost 40 lbs over that past month and has not had much of an appetite. He stated he has had US of the liver and kidney done the day prior to admission and was told no acut findings noted. He had prior history of non -Hodgkin lymphoma in 1995 treated at [facility] but has been stable since. Pt also complained of left lower extremity weakness to a point where he has to lift his leg up to move it. In ED, pt had MRI done of the lumbar and thoracic spine and findings worrisome for metastatic disease of unknown primary. [facility] asked to admit for further evaluation.   Past medical history:  Past Medical History  Diagnosis Date  . Back pain   . Hypertension   . Diabetes mellitus without complication    Consultants: Onc (Dr. [name])  Procedures: None yet. For CT guided Bx and Bone marrow bx next week.  Antibiotics: None  Subjective: Patient says pain is better. Feels stiff in back. Anxious to get testing started.   Objective: Vital Signs  Filed Vitals:   08/21/12 0244 08/21/12 0455 08/21/12 2127 08/22/12 0521  BP: 150/72 140/66 168/78 155/63  Pulse: 103 103 98 68  Temp: 98.3 F (36.8 C) 98.8 F (37.1 C) 98 F (36.7 C) 97.7 F (36.5 C)  TempSrc: Oral Oral Oral Oral  Resp: 18 18 18 18   Height: 5\' 9"  (1.753 m)     Weight: 76.6 kg (168 lb 14 oz)     SpO2: 97% 97% 98% 100%    Intake/Output Summary (Last 24 hours) at 08/22/12 0842 Last data  filed at 08/22/12 8119  Gross per 24 hour  Intake 2991.66 ml  Output    500 ml  Net 2491.66 ml   Filed Weights   08/20/12 1908 08/21/12 0244  Weight: 75.297 kg (166 lb) 76.6 kg (168 lb 14 oz)   General appearance: alert, cooperative, appears stated age and no distress Resp: clear to auscultation bilaterally Cardio: regular rate and rhythm, S1, S2 normal, no murmur, click, rub or gallop GI: soft, non-tender; bowel sounds normal; no masses,  no organomegaly Extremities: extremities normal, atraumatic, no cyanosis or edema Neurologic: Alert and oriented x 3. No cranial nerve deficits. Motor strength was equal BL UE. 3-4/5 in LLE. SLR was negative.   Lab Results:  Basic Metabolic Panel:  Recent Labs Lab 08/20/12 2000 08/21/12 0440 08/22/12 0513  NA 133* 133* 132*  K 3.7 3.3* 3.7  CL 97 100 97  CO2 22 22 23   GLUCOSE 331* 140* 198*  BUN 24* 19 19  CREATININE 1.01 0.93 0.78  CALCIUM 9.5 9.3 9.3   Liver Function Tests:  Recent Labs Lab 08/20/12 2000 08/22/12 0513  AST 155* 61*  ALT 139* 84*  ALKPHOS 378* 316*  BILITOT 0.4 0.3  PROT 6.8 6.3  ALBUMIN 2.1* 2.0*   CBC:  Recent Labs Lab 08/20/12 2000 08/21/12 0440 08/22/12 0513  WBC 6.8 5.0 6.8  NEUTROABS 5.5  --   --  HGB 9.9* 8.5* 9.5*  HCT 30.2* 25.8* 28.2*  MCV 83.4 83.0 81.3  PLT 283 233 241   Cardiac Enzymes:  Recent Labs Lab 08/20/12 2000  CKTOTAL 27   BNP (last 3 results) No results found for this basename: PROBNP,  in the last 8760 hours CBG:  Recent Labs Lab 08/21/12 0717 08/21/12 1142 08/21/12 1707 08/21/12 2124 08/22/12 0817  GLUCAP 152* 280* 357* 241* 168*    No results found for this or any previous visit (from the past 240 hour(s)).    Studies/Results: Ct Chest W Contrast  08/21/2012   *RADIOLOGY REPORT*  Clinical Data:  Malignancy.  CT CHEST, ABDOMEN AND PELVIS WITH CONTRAST  Technique:  Multidetector CT imaging of the chest, abdomen and pelvis was performed following the  standard protocol during bolus administration of intravenous contrast.  Contrast: OMNIPAQUE IOHEXOL 300 MG/ML  SOLN  Comparison:   None.  CT CHEST  Findings:  Nodule on the left thyroid lobe measures 23 mm.  The appearance on CT is nonspecific. There is no axillary adenopathy. No mediastinal or hilar adenopathy.  Three-vessel aortic arch. Aorta and branch vessels appear within normal limits.  Grossly, the pulmonary arteries are within normal limits.  No effusion.  The lungs demonstrate scattered areas of atelectasis.  No pulmonary mass lesion.  The central airways are patent.  IMPRESSION:  1.  23 mm left thyroid lobe nodule. Consider further evaluation with thyroid ultrasound.  If patient is clinically hyperthyroid, consider nuclear medicine thyroid uptake and scan. 2.  No evidence of primarily density in the chest.  CT ABDOMEN AND PELVIS  Findings:  Liver:  Nonspecific 11 mm low density lesion is present in the right hepatic lobe (image number 46 series 2).  Other small areas of decreased attenuation are present in the right hepatic lobe, too small to characterize.  All of these lesions are indeterminant on today's study.  Spleen:  Normal.  Gallbladder:  Contracted.  Common bile duct:  Normal.  Pancreas:  Normal.  Adrenal glands:  Right adrenal gland normal.  The left adrenal gland appears normal however there is a nodule at the genu head is inseparable from the gland itself.  This nodule probably represents a retroperitoneal lymph node and measures 17 mm x 18 mm with surrounding inflammatory changes.  Kidneys:  Exophytic right upper pole simple renal cyst is present that measures 3 cm.  Left upper pole nonobstructing renal collecting system calculus measures 5 mm.  Normal renal enhancement and delayed excretion.  Both ureters appear within normal limits.  Retroperitoneal adenopathy is present.  The largest lymph node measures 6 cm AP by 2 cm transverse (image number 65 series 2). Other smaller periaortic  lymph nodes are present.  Stomach:  No mass lesion.  Grossly normal.  Small bowel:  Normal appearance of duodenum.  Small bowel is decompressed.  No obstruction.  There is no mesenteric adenopathy. Mesenteric vasculature appears normal.  Colon:   Normal appendix.  Colonic diverticulosis.  No discrete mass lesion is identified.  Diverticular hypertrophy of the sigmoid.  Pelvic Genitourinary:  Prostatomegaly.  Eccentrically enlarged prostate gland to the right.  Bones:  No pathologic fracture is identified.  The marrow infiltration seen on MRI is poorly appreciated on CT.  There is some heterogeneous mineralization but no focal destructive osseous lesion.  Hip osteoarthritis is present.  No pathologic fracture is identified.  Nonspecific sclerotic lesion in the medial left iliac bone noted. Potentially this represents a bone island.  Vasculature:  Atherosclerosis.  No acute vascular abnormality.  No aneurysm.  Body Wall: Within normal limits.  IMPRESSION: 1.  Retroperitoneal adenopathy.  Although nonspecific, this probably represents lymphoma. Metastatic lymphadenopathy is also possible.  Largest index node is interposed between the aorta and the left kidney measuring 6 cm x 2 cm. 2.  Indeterminant small low-density right hepatic lobe lesions. Metastatic disease is impossible to exclude. 3.  Right renal cyst. 4.  5 mm nonobstructing left upper pole renal collecting system calculus.   Original Report Authenticated By: Andreas Newport, M.D.   Mr Thoracic Spine W Wo Contrast  08/20/2012   *RADIOLOGY REPORT*  Clinical Data:  Back pain  MRI THORACIC AND LUMBAR SPINE WITHOUT AND WITH CONTRAST  Technique:  Multiplanar and multiecho pulse sequences of the thoracic and lumbar spine were obtained without and with intravenous contrast.  Contrast: 15mL MULTIHANCE GADOBENATE DIMEGLUMINE 529 MG/ML IV SOLN  Comparison:   None.  MRI THORACIC SPINE  Findings: The vertebral bodies are normally aligned with preservation of the normal  thoracic kyphosis.  There is no listhesis.  Vertebral body heights are preserved.  Signal intensity throughout the visualized vertebral body bone marrow is diffusely abnormal with a mottled T1 hypointense, T2 hyperintense appearance.  There is diffuse patchy heterogeneous enhancement seen throughout all the visualized bone marrow. Findings are most consistent with diffuse osseous metastases. No epidural extension of tumor is seen. A single discrete lesion is difficult to measure and demarcated.  There are no epidural collections.  No significant canal or neural foraminal impingement is identified. No pathologic fracture.  Incidental note is made of an exophytic right renal cyst.   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  MRI LUMBAR SPINE  Findings: The vertebral bodies are normally aligned with preservation of the normal lumbar lordosis.  There is no listhesis. Vertebral body heights are preserved.  Signal intensity throughout the visualized vertebral body bone marrow is diffusely abnormal with a mild T1 hypointense, T2 hyperintense appearance.  There is diffuse patchy heterogeneous enhancement on post contrast sequences.  Findin similar changes are seen within the bones of the pelvis.  gs are consistent with diffuse osseous metastases.  It is difficult to measure a single discrete lesion, however, enhancement is most evident within the L3 vertebral body. No epidural tumor.  No pathologic fracture.  At L5-S1, postoperative changes from prior right hemilaminectomy are seen.  There appears to be a small amount of enhancing scar tissue along the right anterolateral aspect of the thecal sac not significant canal impingement.  There is diffuse degenerative disc bulge with disc desiccation and bilateral facet arthrosis at this level. No evident recurrent disc herniation.  There is resultant moderate bilateral foraminal narrowing.  No other significant disc disease identified within the  lumbar spine.  IMPRESSION:  1.  Diffuse abnormal heterogeneous signal intensity throughout the vertebral body bone marrow with heterogeneous enhancement, worrisome for diffuse osseous metastases. 2. Postsurgical changes from prior right hemilaminectomy at L5-S1 with a small amount of enhancing scar tissue formation along the right lateral aspect of the thecal sac.  Degenerative disc disease at L5-S1 as above without recurrent focal disc herniation.  Critical Value/emergent results were called by telephone at the time of interpretation on 08/20/2012 at 10:50 p.m. to [facility], who verbally acknowledged these results.   Original Report Authenticated By: Rise Mu, M.D.   Mr Lumbar Spine W Wo Contrast  08/20/2012   *RADIOLOGY REPORT*  Clinical Data:  Back pain  MRI THORACIC AND  LUMBAR SPINE WITHOUT AND WITH CONTRAST  Technique:  Multiplanar and multiecho pulse sequences of the thoracic and lumbar spine were obtained without and with intravenous contrast.  Contrast: 15mL MULTIHANCE GADOBENATE DIMEGLUMINE 529 MG/ML IV SOLN  Comparison:   None.  MRI THORACIC SPINE  Findings: The vertebral bodies are normally aligned with preservation of the normal thoracic kyphosis.  There is no listhesis.  Vertebral body heights are preserved.  Signal intensity throughout the visualized vertebral body bone marrow is diffusely abnormal with a mottled T1 hypointense, T2 hyperintense appearance.  There is diffuse patchy heterogeneous enhancement seen throughout all the visualized bone marrow. Findings are most consistent with diffuse osseous metastases. No epidural extension of tumor is seen. A single discrete lesion is difficult to measure and demarcated.  There are no epidural collections.  No significant canal or neural foraminal impingement is identified. No pathologic fracture.  Incidental note is made of an exophytic right renal cyst.   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  MRI  LUMBAR SPINE  Findings: The vertebral bodies are normally aligned with preservation of the normal lumbar lordosis.  There is no listhesis. Vertebral body heights are preserved.  Signal intensity throughout the visualized vertebral body bone marrow is diffusely abnormal with a mild T1 hypointense, T2 hyperintense appearance.  There is diffuse patchy heterogeneous enhancement on post contrast sequences.  Findin similar changes are seen within the bones of the pelvis.  gs are consistent with diffuse osseous metastases.  It is difficult to measure a single discrete lesion, however, enhancement is most evident within the L3 vertebral body. No epidural tumor.  No pathologic fracture.  At L5-S1, postoperative changes from prior right hemilaminectomy are seen.  There appears to be a small amount of enhancing scar tissue along the right anterolateral aspect of the thecal sac not significant canal impingement.  There is diffuse degenerative disc bulge with disc desiccation and bilateral facet arthrosis at this level. No evident recurrent disc herniation.  There is resultant moderate bilateral foraminal narrowing.  No other significant disc disease identified within the lumbar spine.  IMPRESSION:  1.  Diffuse abnormal heterogeneous signal intensity throughout the vertebral body bone marrow with heterogeneous enhancement, worrisome for diffuse osseous metastases. 2. Postsurgical changes from prior right hemilaminectomy at L5-S1 with a small amount of enhancing scar tissue formation along the right lateral aspect of the thecal sac.  Degenerative disc disease at L5-S1 as above without recurrent focal disc herniation.  Critical Value/emergent results were called by telephone at the time of interpretation on 08/20/2012 at 10:50 p.m. to [facility], who verbally acknowledged these results.   Original Report Authenticated By: Rise Mu, M.D.   Ct Abdomen Pelvis W Contrast  08/21/2012   *RADIOLOGY REPORT*  Clinical  Data:  Malignancy.  CT CHEST, ABDOMEN AND PELVIS WITH CONTRAST  Technique:  Multidetector CT imaging of the chest, abdomen and pelvis was performed following the standard protocol during bolus administration of intravenous contrast.  Contrast: OMNIPAQUE IOHEXOL 300 MG/ML  SOLN  Comparison:   None.  CT CHEST  Findings:  Nodule on the left thyroid lobe measures 23 mm.  The appearance on CT is nonspecific. There is no axillary adenopathy. No mediastinal or hilar adenopathy.  Three-vessel aortic arch. Aorta and branch vessels appear within normal limits.  Grossly, the pulmonary arteries are within normal limits.  No effusion.  The lungs demonstrate scattered areas of atelectasis.  No pulmonary mass lesion.  The central airways are patent.  IMPRESSION:  1.  23 mm left thyroid lobe nodule. Consider further evaluation with thyroid ultrasound.  If patient is clinically hyperthyroid, consider nuclear medicine thyroid uptake and scan. 2.  No evidence of primarily density in the chest.  CT ABDOMEN AND PELVIS  Findings:  Liver:  Nonspecific 11 mm low density lesion is present in the right hepatic lobe (image number 46 series 2).  Other small areas of decreased attenuation are present in the right hepatic lobe, too small to characterize.  All of these lesions are indeterminant on today's study.  Spleen:  Normal.  Gallbladder:  Contracted.  Common bile duct:  Normal.  Pancreas:  Normal.  Adrenal glands:  Right adrenal gland normal.  The left adrenal gland appears normal however there is a nodule at the genu head is inseparable from the gland itself.  This nodule probably represents a retroperitoneal lymph node and measures 17 mm x 18 mm with surrounding inflammatory changes.  Kidneys:  Exophytic right upper pole simple renal cyst is present that measures 3 cm.  Left upper pole nonobstructing renal collecting system calculus measures 5 mm.  Normal renal enhancement and delayed excretion.  Both ureters appear within normal  limits.  Retroperitoneal adenopathy is present.  The largest lymph node measures 6 cm AP by 2 cm transverse (image number 65 series 2). Other smaller periaortic lymph nodes are present.  Stomach:  No mass lesion.  Grossly normal.  Small bowel:  Normal appearance of duodenum.  Small bowel is decompressed.  No obstruction.  There is no mesenteric adenopathy. Mesenteric vasculature appears normal.  Colon:   Normal appendix.  Colonic diverticulosis.  No discrete mass lesion is identified.  Diverticular hypertrophy of the sigmoid.  Pelvic Genitourinary:  Prostatomegaly.  Eccentrically enlarged prostate gland to the right.  Bones:  No pathologic fracture is identified.  The marrow infiltration seen on MRI is poorly appreciated on CT.  There is some heterogeneous mineralization but no focal destructive osseous lesion.  Hip osteoarthritis is present.  No pathologic fracture is identified.  Nonspecific sclerotic lesion in the medial left iliac bone noted. Potentially this represents a bone island.  Vasculature: Atherosclerosis.  No acute vascular abnormality.  No aneurysm.  Body Wall: Within normal limits.  IMPRESSION: 1.  Retroperitoneal adenopathy.  Although nonspecific, this probably represents lymphoma. Metastatic lymphadenopathy is also possible.  Largest index node is interposed between the aorta and the left kidney measuring 6 cm x 2 cm. 2.  Indeterminant small low-density right hepatic lobe lesions. Metastatic disease is impossible to exclude. 3.  Right renal cyst. 4.  5 mm nonobstructing left upper pole renal collecting system calculus.   Original Report Authenticated By: Andreas Newport, M.D.   Dg Chest Port 1 View  08/20/2012   *RADIOLOGY REPORT*  Clinical Data: Tachycardia  PORTABLE CHEST - 1 VIEW  Comparison: None.  Findings: Lungs are clear. No pleural effusion or pneumothorax.  The heart is top normal in size.  IMPRESSION: No evidence of acute cardiopulmonary disease.   Original Report Authenticated By:  Charline Bills, M.D.    Medications:  Scheduled: . enoxaparin (LOVENOX) injection  40 mg Subcutaneous Daily  . feeding supplement  237 mL Oral TID BM  . insulin aspart  0-9 Units Subcutaneous TID WC  . insulin glargine  20 Units Subcutaneous QPM  . losartan  50 mg Oral Daily  . sodium chloride  3 mL Intravenous Q12H   Continuous: . sodium chloride 100 mL/hr at 08/22/12 1610   RUE:[id number], HYDROcodone-acetaminophen, HYDROmorphone (DILAUDID) injection,  ondansetron (ZOFRAN) IV, ondansetron  Assessment/Plan:  Principal Problem:   Generalized weakness Active Problems:   Anemia of chronic disease   Transaminitis   Diabetes   Metastatic cancer   Protein-calorie malnutrition, severe    Generalized weakness with mostly left lower extremity weakness  This is likely secondary to failure to thrive in the setting of widely metastatic disease. No evidence for cord compression. MRi Brain is pending. PT/OT. Pain control.   Metastatic disease of unknown primary  Reviewed all imaging reports. Appreciate Dr. Gustavo Lah input. CT guided Bx and Bone marrow bx ordered. Await reports from [facility] regarding previous diagnosis. Continue steroids for now as he has been on it for a while, though it could alter tissue diagnosis.  Thyroid Nodule TSH and FT4 are normal. Will need follow up for same as OP.  Transaminitis  Improved today. Possibly due to Liver metastatic disease. Monitor.  Diabetes Mellitus 2 Made worse by steroids. A1c is 8.8. Continue SSI and Lantus. Adjust dose as indicated.  Anemia of chronic disease  Anemia panel reviewed. Hgb stable. Transfuse as needed. No active bleeding noted.  History of HTN Monitor BP. Continue Losartan. Replete K.  Code Status:  Full Code DVT Prophylaxis:   Enoxaprin Family Communication:  Discussed with patient and wife Disposition Plan: Pending further evaluation.    LOS: 2 days   [facility]  [facility] Pager  [phone] 08/22/2012, 8:42 AM  If 8PM-8AM, please contact night-coverage at [email or web address], password [facility]

## 2012-08-22 NOTE — H&P (Signed)
Keith Duffy is an 67 y.o. male.   Chief Complaint: Weakness; wt loss; anemia; back pain Hx Non Hodgkin's Lymphoma 1995 Work up reveals bony lesions; Lymphadenopathy Scheduled for L retroperitoneal LAN (core) and Bone marrow bx 7/7 HPI: back pain; HTN; DM; NHL hx  Past Medical History  Diagnosis Date  . Back pain   . Hypertension   . Diabetes mellitus without complication     History reviewed. No pertinent past surgical history.  History reviewed. No pertinent family history. Social History:  reports that he has never smoked. He does not have any smokeless tobacco history on file. He reports that he does not drink alcohol or use illicit drugs.  Allergies:  Allergies  Allergen Reactions  . Aspartame And Phenylalanine Diarrhea  . Codeine     Medications Prior to Admission  Medication Sig Dispense Refill  . cyclobenzaprine (FLEXERIL) 10 MG tablet Take 10 mg by mouth 3 (three) times daily as needed for muscle spasms.      . Insulin Glargine (LANTUS SOLOSTAR) 100 UNIT/ML SOPN Inject 20 Units into the skin every evening.      Marland Kitchen losartan (COZAAR) 50 MG tablet Take 50 mg by mouth daily.      Marland Kitchen oxycodone (OXY-IR) 5 MG capsule Take 5 mg by mouth every 6 (six) hours as needed for pain.        Results for orders placed during the hospital encounter of 08/20/12 (from the past 48 hour(s))  CBC WITH DIFFERENTIAL     Status: Abnormal   Collection Time    08/20/12  8:00 PM      Result Value Range   WBC 6.8  4.0 - 10.5 K/uL   RBC 3.62 (*) 4.22 - 5.81 MIL/uL   Hemoglobin 9.9 (*) 13.0 - 17.0 g/dL   HCT 16.1 (*) 09.6 - 04.5 %   MCV 83.4  78.0 - 100.0 fL   MCH 27.3  26.0 - 34.0 pg   MCHC 32.8  30.0 - 36.0 g/dL   RDW 40.9  81.1 - 91.4 %   Platelets 283  150 - 400 K/uL   Neutrophils Relative % 81 (*) 43 - 77 %   Neutro Abs 5.5  1.7 - 7.7 K/uL   Lymphocytes Relative 12  12 - 46 %   Lymphs Abs 0.8  0.7 - 4.0 K/uL   Monocytes Relative 7  3 - 12 %   Monocytes Absolute 0.4  0.1 - 1.0 K/uL    Eosinophils Relative 0  0 - 5 %   Eosinophils Absolute 0.0  0.0 - 0.7 K/uL   Basophils Relative 0  0 - 1 %   Basophils Absolute 0.0  0.0 - 0.1 K/uL  COMPREHENSIVE METABOLIC PANEL     Status: Abnormal   Collection Time    08/20/12  8:00 PM      Result Value Range   Sodium 133 (*) 135 - 145 mEq/L   Potassium 3.7  3.5 - 5.1 mEq/L   Chloride 97  96 - 112 mEq/L   CO2 22  19 - 32 mEq/L   Glucose, Bld 331 (*) 70 - 99 mg/dL   BUN 24 (*) 6 - 23 mg/dL   Creatinine, Ser 7.82  0.50 - 1.35 mg/dL   Calcium 9.5  8.4 - 95.6 mg/dL   Total Protein 6.8  6.0 - 8.3 g/dL   Albumin 2.1 (*) 3.5 - 5.2 g/dL   AST 213 (*) 0 - 37 U/L   ALT 139 (*) 0 -  53 U/L   Alkaline Phosphatase 378 (*) 39 - 117 U/L   Total Bilirubin 0.4  0.3 - 1.2 mg/dL   GFR calc non Af Amer 76 (*) >90 mL/min   GFR calc Af Amer 88 (*) >90 mL/min   Comment:            The eGFR has been calculated     using the CKD EPI equation.     This calculation has not been     validated in all clinical     situations.     eGFR's persistently     <90 mL/min signify     possible Chronic Kidney Disease.  CK     Status: None   Collection Time    08/20/12  8:00 PM      Result Value Range   Total CK 27  7 - 232 U/L  CG4 I-STAT (LACTIC ACID)     Status: None   Collection Time    08/20/12  8:07 PM      Result Value Range   Lactic Acid, Venous 1.88  0.5 - 2.2 mmol/L  HEMOGLOBIN A1C     Status: Abnormal   Collection Time    08/21/12  1:00 AM      Result Value Range   Hemoglobin A1C 8.8 (*) <5.7 %   Comment: (NOTE)                                                                               According to the ADA Clinical Practice Recommendations for 2011, when     HbA1c is used as a screening test:      >=6.5%   Diagnostic of Diabetes Mellitus               (if abnormal result is confirmed)     5.7-6.4%   Increased risk of developing Diabetes Mellitus     References:Diagnosis and Classification of Diabetes Mellitus,Diabetes      Care,2011,34(Suppl 1):S62-S69 and Standards of Medical Care in             Diabetes - 2011,Diabetes Care,2011,34 (Suppl 1):S11-S61.   Mean Plasma Glucose 206 (*) <117 mg/dL  VITAMIN Z61     Status: None   Collection Time    08/21/12  1:00 AM      Result Value Range   Vitamin B-12 250  211 - 911 pg/mL  FOLATE     Status: None   Collection Time    08/21/12  1:00 AM      Result Value Range   Folate 7.9     Comment: (NOTE)     Reference Ranges            Deficient:       0.4 - 3.3 ng/mL            Indeterminate:   3.4 - 5.4 ng/mL            Normal:              > 5.4 ng/mL  IRON AND TIBC     Status: Abnormal   Collection Time    08/21/12  1:00 AM  Result Value Range   Iron 52  42 - 135 ug/dL   TIBC 161 (*) 096 - 045 ug/dL   Saturation Ratios 27  20 - 55 %   UIBC 139  125 - 400 ug/dL  FERRITIN     Status: Abnormal   Collection Time    08/21/12  1:00 AM      Result Value Range   Ferritin 7487 (*) 22 - 322 ng/mL   Comment: Result confirmed by automatic dilution.  RETICULOCYTES     Status: Abnormal   Collection Time    08/21/12  1:00 AM      Result Value Range   Retic Ct Pct 1.1  0.4 - 3.1 %   RBC. 3.23 (*) 4.22 - 5.81 MIL/uL   Retic Count, Manual 35.5  19.0 - 186.0 K/uL  BASIC METABOLIC PANEL     Status: Abnormal   Collection Time    08/21/12  4:40 AM      Result Value Range   Sodium 133 (*) 135 - 145 mEq/L   Potassium 3.3 (*) 3.5 - 5.1 mEq/L   Chloride 100  96 - 112 mEq/L   CO2 22  19 - 32 mEq/L   Glucose, Bld 140 (*) 70 - 99 mg/dL   BUN 19  6 - 23 mg/dL   Creatinine, Ser 4.09  0.50 - 1.35 mg/dL   Calcium 9.3  8.4 - 81.1 mg/dL   GFR calc non Af Amer 86 (*) >90 mL/min   GFR calc Af Amer >90  >90 mL/min   Comment:            The eGFR has been calculated     using the CKD EPI equation.     This calculation has not been     validated in all clinical     situations.     eGFR's persistently     <90 mL/min signify     possible Chronic Kidney Disease.  CBC      Status: Abnormal   Collection Time    08/21/12  4:40 AM      Result Value Range   WBC 5.0  4.0 - 10.5 K/uL   RBC 3.11 (*) 4.22 - 5.81 MIL/uL   Hemoglobin 8.5 (*) 13.0 - 17.0 g/dL   HCT 91.4 (*) 78.2 - 95.6 %   MCV 83.0  78.0 - 100.0 fL   MCH 27.3  26.0 - 34.0 pg   MCHC 32.9  30.0 - 36.0 g/dL   RDW 21.3  08.6 - 57.8 %   Platelets 233  150 - 400 K/uL  TSH     Status: None   Collection Time    08/21/12  4:40 AM      Result Value Range   TSH 2.694  0.350 - 4.500 uIU/mL  T4, FREE     Status: None   Collection Time    08/21/12  4:40 AM      Result Value Range   Free T4 1.43  0.80 - 1.80 ng/dL  GLUCOSE, CAPILLARY     Status: Abnormal   Collection Time    08/21/12  7:17 AM      Result Value Range   Glucose-Capillary 152 (*) 70 - 99 mg/dL  URINALYSIS, ROUTINE W REFLEX MICROSCOPIC     Status: Abnormal   Collection Time    08/21/12  9:02 AM      Result Value Range   Color, Urine YELLOW  YELLOW   APPearance CLEAR  CLEAR   Specific Gravity, Urine >1.046 (*) 1.005 - 1.030   pH 5.0  5.0 - 8.0   Glucose, UA 250 (*) NEGATIVE mg/dL   Hgb urine dipstick SMALL (*) NEGATIVE   Bilirubin Urine NEGATIVE  NEGATIVE   Ketones, ur NEGATIVE  NEGATIVE mg/dL   Protein, ur NEGATIVE  NEGATIVE mg/dL   Urobilinogen, UA 0.2  0.0 - 1.0 mg/dL   Nitrite NEGATIVE  NEGATIVE   Leukocytes, UA NEGATIVE  NEGATIVE  URINE MICROSCOPIC-ADD ON     Status: None   Collection Time    08/21/12  9:02 AM      Result Value Range   RBC / HPF 0-2  <3 RBC/hpf   Urine-Other MUCOUS PRESENT    GLUCOSE, CAPILLARY     Status: Abnormal   Collection Time    08/21/12 11:42 AM      Result Value Range   Glucose-Capillary 280 (*) 70 - 99 mg/dL  GLUCOSE, CAPILLARY     Status: Abnormal   Collection Time    08/21/12  5:07 PM      Result Value Range   Glucose-Capillary 357 (*) 70 - 99 mg/dL  GLUCOSE, CAPILLARY     Status: Abnormal   Collection Time    08/21/12  9:24 PM      Result Value Range   Glucose-Capillary 241 (*) 70 -  99 mg/dL  CBC     Status: Abnormal   Collection Time    08/22/12  5:13 AM      Result Value Range   WBC 6.8  4.0 - 10.5 K/uL   RBC 3.47 (*) 4.22 - 5.81 MIL/uL   Hemoglobin 9.5 (*) 13.0 - 17.0 g/dL   HCT 84.6 (*) 96.2 - 95.2 %   MCV 81.3  78.0 - 100.0 fL   MCH 27.4  26.0 - 34.0 pg   MCHC 33.7  30.0 - 36.0 g/dL   RDW 84.1  32.4 - 40.1 %   Platelets 241  150 - 400 K/uL  COMPREHENSIVE METABOLIC PANEL     Status: Abnormal   Collection Time    08/22/12  5:13 AM      Result Value Range   Sodium 132 (*) 135 - 145 mEq/L   Potassium 3.7  3.5 - 5.1 mEq/L   Chloride 97  96 - 112 mEq/L   CO2 23  19 - 32 mEq/L   Glucose, Bld 198 (*) 70 - 99 mg/dL   BUN 19  6 - 23 mg/dL   Creatinine, Ser 0.27  0.50 - 1.35 mg/dL   Calcium 9.3  8.4 - 25.3 mg/dL   Total Protein 6.3  6.0 - 8.3 g/dL   Albumin 2.0 (*) 3.5 - 5.2 g/dL   AST 61 (*) 0 - 37 U/L   ALT 84 (*) 0 - 53 U/L   Alkaline Phosphatase 316 (*) 39 - 117 U/L   Total Bilirubin 0.3  0.3 - 1.2 mg/dL   GFR calc non Af Amer >90  >90 mL/min   GFR calc Af Amer >90  >90 mL/min   Comment:            The eGFR has been calculated     using the CKD EPI equation.     This calculation has not been     validated in all clinical     situations.     eGFR's persistently     <90 mL/min signify     possible Chronic Kidney Disease.  LACTATE DEHYDROGENASE  Status: Abnormal   Collection Time    08/22/12  5:13 AM      Result Value Range   LDH 2910 (*) 94 - 250 U/L   Comment: RESULTS CONFIRMED BY MANUAL DILUTION  SAVE SMEAR     Status: None   Collection Time    08/22/12  5:13 AM      Result Value Range   Smear Review SMEAR STAINED AND AVAILABLE FOR REVIEW    PROTIME-INR     Status: None   Collection Time    08/22/12  8:00 AM      Result Value Range   Prothrombin Time 13.9  11.6 - 15.2 seconds   INR 1.09  0.00 - 1.49  APTT     Status: None   Collection Time    08/22/12  8:00 AM      Result Value Range   aPTT 35  24 - 37 seconds  GLUCOSE, CAPILLARY      Status: Abnormal   Collection Time    08/22/12  8:17 AM      Result Value Range   Glucose-Capillary 168 (*) 70 - 99 mg/dL   Ct Chest W Contrast  08/21/2012   *RADIOLOGY REPORT*  Clinical Data:  Malignancy.  CT CHEST, ABDOMEN AND PELVIS WITH CONTRAST  Technique:  Multidetector CT imaging of the chest, abdomen and pelvis was performed following the standard protocol during bolus administration of intravenous contrast.  Contrast: OMNIPAQUE IOHEXOL 300 MG/ML  SOLN  Comparison:   None.  CT CHEST  Findings:  Nodule on the left thyroid lobe measures 23 mm.  The appearance on CT is nonspecific. There is no axillary adenopathy. No mediastinal or hilar adenopathy.  Three-vessel aortic arch. Aorta and branch vessels appear within normal limits.  Grossly, the pulmonary arteries are within normal limits.  No effusion.  The lungs demonstrate scattered areas of atelectasis.  No pulmonary mass lesion.  The central airways are patent.  IMPRESSION:  1.  23 mm left thyroid lobe nodule. Consider further evaluation with thyroid ultrasound.  If patient is clinically hyperthyroid, consider nuclear medicine thyroid uptake and scan. 2.  No evidence of primarily density in the chest.  CT ABDOMEN AND PELVIS  Findings:  Liver:  Nonspecific 11 mm low density lesion is present in the right hepatic lobe (image number 46 series 2).  Other small areas of decreased attenuation are present in the right hepatic lobe, too small to characterize.  All of these lesions are indeterminant on today's study.  Spleen:  Normal.  Gallbladder:  Contracted.  Common bile duct:  Normal.  Pancreas:  Normal.  Adrenal glands:  Right adrenal gland normal.  The left adrenal gland appears normal however there is a nodule at the genu head is inseparable from the gland itself.  This nodule probably represents a retroperitoneal lymph node and measures 17 mm x 18 mm with surrounding inflammatory changes.  Kidneys:  Exophytic right upper pole simple renal cyst  is present that measures 3 cm.  Left upper pole nonobstructing renal collecting system calculus measures 5 mm.  Normal renal enhancement and delayed excretion.  Both ureters appear within normal limits.  Retroperitoneal adenopathy is present.  The largest lymph node measures 6 cm AP by 2 cm transverse (image number 65 series 2). Other smaller periaortic lymph nodes are present.  Stomach:  No mass lesion.  Grossly normal.  Small bowel:  Normal appearance of duodenum.  Small bowel is decompressed.  No obstruction.  There is  no mesenteric adenopathy. Mesenteric vasculature appears normal.  Colon:   Normal appendix.  Colonic diverticulosis.  No discrete mass lesion is identified.  Diverticular hypertrophy of the sigmoid.  Pelvic Genitourinary:  Prostatomegaly.  Eccentrically enlarged prostate gland to the right.  Bones:  No pathologic fracture is identified.  The marrow infiltration seen on MRI is poorly appreciated on CT.  There is some heterogeneous mineralization but no focal destructive osseous lesion.  Hip osteoarthritis is present.  No pathologic fracture is identified.  Nonspecific sclerotic lesion in the medial left iliac bone noted. Potentially this represents a bone island.  Vasculature: Atherosclerosis.  No acute vascular abnormality.  No aneurysm.  Body Wall: Within normal limits.  IMPRESSION: 1.  Retroperitoneal adenopathy.  Although nonspecific, this probably represents lymphoma. Metastatic lymphadenopathy is also possible.  Largest index node is interposed between the aorta and the left kidney measuring 6 cm x 2 cm. 2.  Indeterminant small low-density right hepatic lobe lesions. Metastatic disease is impossible to exclude. 3.  Right renal cyst. 4.  5 mm nonobstructing left upper pole renal collecting system calculus.   Original Report Authenticated By: Andreas Newport, M.D.   Mr Thoracic Spine W Wo Contrast  08/20/2012   *RADIOLOGY REPORT*  Clinical Data:  Back pain  MRI THORACIC AND LUMBAR SPINE WITHOUT  AND WITH CONTRAST  Technique:  Multiplanar and multiecho pulse sequences of the thoracic and lumbar spine were obtained without and with intravenous contrast.  Contrast: 15mL MULTIHANCE GADOBENATE DIMEGLUMINE 529 MG/ML IV SOLN  Comparison:   None.  MRI THORACIC SPINE  Findings: The vertebral bodies are normally aligned with preservation of the normal thoracic kyphosis.  There is no listhesis.  Vertebral body heights are preserved.  Signal intensity throughout the visualized vertebral body bone marrow is diffusely abnormal with a mottled T1 hypointense, T2 hyperintense appearance.  There is diffuse patchy heterogeneous enhancement seen throughout all the visualized bone marrow. Findings are most consistent with diffuse osseous metastases. No epidural extension of tumor is seen. A single discrete lesion is difficult to measure and demarcated.  There are no epidural collections.  No significant canal or neural foraminal impingement is identified. No pathologic fracture.  Incidental note is made of an exophytic right renal cyst.   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  MRI LUMBAR SPINE  Findings: The vertebral bodies are normally aligned with preservation of the normal lumbar lordosis.  There is no listhesis. Vertebral body heights are preserved.  Signal intensity throughout the visualized vertebral body bone marrow is diffusely abnormal with a mild T1 hypointense, T2 hyperintense appearance.  There is diffuse patchy heterogeneous enhancement on post contrast sequences.  Findin similar changes are seen within the bones of the pelvis.  gs are consistent with diffuse osseous metastases.  It is difficult to measure a single discrete lesion, however, enhancement is most evident within the L3 vertebral body. No epidural tumor.  No pathologic fracture.  At L5-S1, postoperative changes from prior right hemilaminectomy are seen.  There appears to be a small amount of enhancing scar tissue along  the right anterolateral aspect of the thecal sac not significant canal impingement.  There is diffuse degenerative disc bulge with disc desiccation and bilateral facet arthrosis at this level. No evident recurrent disc herniation.  There is resultant moderate bilateral foraminal narrowing.  No other significant disc disease identified within the lumbar spine.  IMPRESSION:  1.  Diffuse abnormal heterogeneous signal intensity throughout the vertebral body bone marrow with heterogeneous enhancement,  worrisome for diffuse osseous metastases. 2. Postsurgical changes from prior right hemilaminectomy at L5-S1 with a small amount of enhancing scar tissue formation along the right lateral aspect of the thecal sac.  Degenerative disc disease at L5-S1 as above without recurrent focal disc herniation.  Critical Value/emergent results were called by telephone at the time of interpretation on 08/20/2012 at 10:50 p.m. to [facility], who verbally acknowledged these results.   Original Report Authenticated By: Rise Mu, M.D.   Mr Lumbar Spine W Wo Contrast  08/20/2012   *RADIOLOGY REPORT*  Clinical Data:  Back pain  MRI THORACIC AND LUMBAR SPINE WITHOUT AND WITH CONTRAST  Technique:  Multiplanar and multiecho pulse sequences of the thoracic and lumbar spine were obtained without and with intravenous contrast.  Contrast: 15mL MULTIHANCE GADOBENATE DIMEGLUMINE 529 MG/ML IV SOLN  Comparison:   None.  MRI THORACIC SPINE  Findings: The vertebral bodies are normally aligned with preservation of the normal thoracic kyphosis.  There is no listhesis.  Vertebral body heights are preserved.  Signal intensity throughout the visualized vertebral body bone marrow is diffusely abnormal with a mottled T1 hypointense, T2 hyperintense appearance.  There is diffuse patchy heterogeneous enhancement seen throughout all the visualized bone marrow. Findings are most consistent with diffuse osseous metastases. No epidural extension of  tumor is seen. A single discrete lesion is difficult to measure and demarcated.  There are no epidural collections.  No significant canal or neural foraminal impingement is identified. No pathologic fracture.  Incidental note is made of an exophytic right renal cyst.   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  MRI LUMBAR SPINE  Findings: The vertebral bodies are normally aligned with preservation of the normal lumbar lordosis.  There is no listhesis. Vertebral body heights are preserved.  Signal intensity throughout the visualized vertebral body bone marrow is diffusely abnormal with a mild T1 hypointense, T2 hyperintense appearance.  There is diffuse patchy heterogeneous enhancement on post contrast sequences.  Findin similar changes are seen within the bones of the pelvis.  gs are consistent with diffuse osseous metastases.  It is difficult to measure a single discrete lesion, however, enhancement is most evident within the L3 vertebral body. No epidural tumor.  No pathologic fracture.  At L5-S1, postoperative changes from prior right hemilaminectomy are seen.  There appears to be a small amount of enhancing scar tissue along the right anterolateral aspect of the thecal sac not significant canal impingement.  There is diffuse degenerative disc bulge with disc desiccation and bilateral facet arthrosis at this level. No evident recurrent disc herniation.  There is resultant moderate bilateral foraminal narrowing.  No other significant disc disease identified within the lumbar spine.  IMPRESSION:  1.  Diffuse abnormal heterogeneous signal intensity throughout the vertebral body bone marrow with heterogeneous enhancement, worrisome for diffuse osseous metastases. 2. Postsurgical changes from prior right hemilaminectomy at L5-S1 with a small amount of enhancing scar tissue formation along the right lateral aspect of the thecal sac.  Degenerative disc disease at L5-S1 as above without  recurrent focal disc herniation.  Critical Value/emergent results were called by telephone at the time of interpretation on 08/20/2012 at 10:50 p.m. to [facility], who verbally acknowledged these results.   Original Report Authenticated By: Rise Mu, M.D.   Ct Abdomen Pelvis W Contrast  08/21/2012   *RADIOLOGY REPORT*  Clinical Data:  Malignancy.  CT CHEST, ABDOMEN AND PELVIS WITH CONTRAST  Technique:  Multidetector CT imaging of the chest, abdomen and  pelvis was performed following the standard protocol during bolus administration of intravenous contrast.  Contrast: OMNIPAQUE IOHEXOL 300 MG/ML  SOLN  Comparison:   None.  CT CHEST  Findings:  Nodule on the left thyroid lobe measures 23 mm.  The appearance on CT is nonspecific. There is no axillary adenopathy. No mediastinal or hilar adenopathy.  Three-vessel aortic arch. Aorta and branch vessels appear within normal limits.  Grossly, the pulmonary arteries are within normal limits.  No effusion.  The lungs demonstrate scattered areas of atelectasis.  No pulmonary mass lesion.  The central airways are patent.  IMPRESSION:  1.  23 mm left thyroid lobe nodule. Consider further evaluation with thyroid ultrasound.  If patient is clinically hyperthyroid, consider nuclear medicine thyroid uptake and scan. 2.  No evidence of primarily density in the chest.  CT ABDOMEN AND PELVIS  Findings:  Liver:  Nonspecific 11 mm low density lesion is present in the right hepatic lobe (image number 46 series 2).  Other small areas of decreased attenuation are present in the right hepatic lobe, too small to characterize.  All of these lesions are indeterminant on today's study.  Spleen:  Normal.  Gallbladder:  Contracted.  Common bile duct:  Normal.  Pancreas:  Normal.  Adrenal glands:  Right adrenal gland normal.  The left adrenal gland appears normal however there is a nodule at the genu head is inseparable from the gland itself.  This nodule probably represents  a retroperitoneal lymph node and measures 17 mm x 18 mm with surrounding inflammatory changes.  Kidneys:  Exophytic right upper pole simple renal cyst is present that measures 3 cm.  Left upper pole nonobstructing renal collecting system calculus measures 5 mm.  Normal renal enhancement and delayed excretion.  Both ureters appear within normal limits.  Retroperitoneal adenopathy is present.  The largest lymph node measures 6 cm AP by 2 cm transverse (image number 65 series 2). Other smaller periaortic lymph nodes are present.  Stomach:  No mass lesion.  Grossly normal.  Small bowel:  Normal appearance of duodenum.  Small bowel is decompressed.  No obstruction.  There is no mesenteric adenopathy. Mesenteric vasculature appears normal.  Colon:   Normal appendix.  Colonic diverticulosis.  No discrete mass lesion is identified.  Diverticular hypertrophy of the sigmoid.  Pelvic Genitourinary:  Prostatomegaly.  Eccentrically enlarged prostate gland to the right.  Bones:  No pathologic fracture is identified.  The marrow infiltration seen on MRI is poorly appreciated on CT.  There is some heterogeneous mineralization but no focal destructive osseous lesion.  Hip osteoarthritis is present.  No pathologic fracture is identified.  Nonspecific sclerotic lesion in the medial left iliac bone noted. Potentially this represents a bone island.  Vasculature: Atherosclerosis.  No acute vascular abnormality.  No aneurysm.  Body Wall: Within normal limits.  IMPRESSION: 1.  Retroperitoneal adenopathy.  Although nonspecific, this probably represents lymphoma. Metastatic lymphadenopathy is also possible.  Largest index node is interposed between the aorta and the left kidney measuring 6 cm x 2 cm. 2.  Indeterminant small low-density right hepatic lobe lesions. Metastatic disease is impossible to exclude. 3.  Right renal cyst. 4.  5 mm nonobstructing left upper pole renal collecting system calculus.   Original Report Authenticated By:  Andreas Newport, M.D.   Dg Chest Port 1 View  08/20/2012   *RADIOLOGY REPORT*  Clinical Data: Tachycardia  PORTABLE CHEST - 1 VIEW  Comparison: None.  Findings: Lungs are clear. No pleural effusion or  pneumothorax.  The heart is top normal in size.  IMPRESSION: No evidence of acute cardiopulmonary disease.   Original Report Authenticated By: Charline Bills, M.D.    Review of Systems  Constitutional: Positive for weight loss. Negative for fever.  Eyes: Negative for blurred vision.  Respiratory: Negative for shortness of breath.   Gastrointestinal: Positive for abdominal pain. Negative for nausea.  Musculoskeletal: Positive for back pain.  Neurological: Positive for weakness. Negative for dizziness and headaches.    Blood pressure 155/63, pulse 68, temperature 97.7 F (36.5 C), temperature source Oral, resp. rate 18, height 5\' 9"  (1.753 m), weight 168 lb 14 oz (76.6 kg), SpO2 100.00%. Physical Exam  Constitutional: He is oriented to person, place, and time.  Cardiovascular: Normal rate, regular rhythm and normal heart sounds.   No murmur heard. Respiratory: Effort normal and breath sounds normal. He has no wheezes.  GI: Soft. Bowel sounds are normal. There is no tenderness.  Musculoskeletal: Normal range of motion.  weak  Neurological: He is alert and oriented to person, place, and time.  Psychiatric: He has a normal mood and affect. His behavior is normal. Judgment and thought content normal.     Assessment/Plan Anemia; bone pain; LAN Wt loss Hx NHL Scheduled for bone marrow bx and L RP LAN- core bx 7/7 Pt and wife aware of procedure benefits and risks and agreeable to proceed Consent signed and in chart   Boyd Litaker A 08/22/2012, 10:43 AM

## 2012-08-22 NOTE — Progress Notes (Signed)
Received report from Royal, Charity fundraiser.  Agree with previous shift assessment already documented.  Will continue to monitor pt throughout the night.

## 2012-08-23 DIAGNOSIS — I498 Other specified cardiac arrhythmias: Secondary | ICD-10-CM

## 2012-08-23 LAB — PSA: PSA: 4.74 ng/mL — ABNORMAL HIGH (ref <=4.00)

## 2012-08-23 LAB — CBC
Hemoglobin: 9.7 g/dL — ABNORMAL LOW (ref 13.0–17.0)
MCH: 27.2 pg (ref 26.0–34.0)
MCHC: 33.1 g/dL (ref 30.0–36.0)
Platelets: 281 10*3/uL (ref 150–400)
RBC: 3.57 MIL/uL — ABNORMAL LOW (ref 4.22–5.81)

## 2012-08-23 LAB — GLUCOSE, CAPILLARY
Glucose-Capillary: 196 mg/dL — ABNORMAL HIGH (ref 70–99)
Glucose-Capillary: 162 mg/dL — ABNORMAL HIGH (ref 70–99)

## 2012-08-23 LAB — LACTATE DEHYDROGENASE: LDH: 4944 U/L — ABNORMAL HIGH (ref 94–250)

## 2012-08-23 LAB — BASIC METABOLIC PANEL
CO2: 21 mEq/L (ref 19–32)
Calcium: 9.2 mg/dL (ref 8.4–10.5)
GFR calc non Af Amer: >90 mL/min (ref >90)
Potassium: 4.2 mEq/L (ref 3.5–5.1)
Sodium: 133 mEq/L — ABNORMAL LOW (ref 135–145)

## 2012-08-23 MED ORDER — SODIUM CHLORIDE 0.9 % IV BOLUS (SEPSIS)
500.0000 mL | Freq: Once | INTRAVENOUS | Status: AC
  Administered 2012-08-23: 500 mL via INTRAVENOUS

## 2012-08-23 MED ORDER — METOPROLOL TARTRATE 25 MG PO TABS
25.0000 mg | ORAL_TABLET | Freq: Two times a day (BID) | ORAL | Status: DC
  Administered 2012-08-23 – 2012-08-28 (×11): 25 mg via ORAL
  Filled 2012-08-23 (×12): qty 1

## 2012-08-23 MED ORDER — INSULIN GLARGINE 100 UNIT/ML ~~LOC~~ SOLN
10.0000 [IU] | Freq: Every day | SUBCUTANEOUS | Status: DC
  Administered 2012-08-23 – 2012-08-28 (×6): 10 [IU] via SUBCUTANEOUS
  Filled 2012-08-23 (×6): qty 0.1

## 2012-08-23 MED ORDER — INSULIN GLARGINE 100 UNIT/ML ~~LOC~~ SOLN
20.0000 [IU] | Freq: Every day | SUBCUTANEOUS | Status: DC
  Administered 2012-08-23 – 2012-08-27 (×5): 20 [IU] via SUBCUTANEOUS
  Filled 2012-08-23 (×8): qty 0.2

## 2012-08-23 NOTE — Progress Notes (Signed)
On-call provider notified of pt HR sustaining around 130.  Pt in no acute distress and restful.  EKG obtained per orders and provider notified of result of sinus tachycardia.  Order for NS bolus obtained.  Will monitor.

## 2012-08-23 NOTE — Progress Notes (Addendum)
[facility] PROGRESS NOTE  Keith Duffy:[id number] DOB: October 26, 1945 DOA: 08/20/2012  PCP: [name], MD  Brief HPI: Pt is 67 yo male who presented to [facility] ED with main concern of progressively worsening generalized weakness, that he initially noted several months prior to this admission, associated with lower back pain, constant and sharp, 10/10 in severity with no specific alleviating factors or aggravating factors. He explained his PCP started him on Prednisone and his sugar levels have been rather difficult to control. He explained he has lost 40 lbs over that past month and has not had much of an appetite. He stated he has had US of the liver and kidney done the day prior to admission and was told no acut findings noted. He had prior history of non -Hodgkin lymphoma in 1995 treated at [facility] but has been stable since. Pt also complained of left lower extremity weakness to a point where he has to lift his leg up to move it. In ED, pt had MRI done of the lumbar and thoracic spine and findings worrisome for metastatic disease of unknown primary. TR[facility] asked to admit for further evaluation.   Past medical history:  Past Medical History  Diagnosis Date  . Back pain   . Hypertension   . Diabetes mellitus without complication    Consultants: Onc (Dr. [name])  Procedures: For CT guided Bx and Bone marrow bx next week.  Antibiotics: None  Subjective: Patient says pain was poorly controlled yesterday. Denies any chest pain or shortness of breath.  Objective: Vital Signs  Filed Vitals:   08/22/12 0521 08/22/12 1329 08/22/12 2149 08/23/12 0600  BP: 155/63 172/88 151/94 144/89  Pulse: 68 100 120 126  Temp: 97.7 F (36.5 C) 97.7 F (36.5 C) 98.3 F (36.8 C) 97.8 F (36.6 C)  TempSrc: Oral Oral Oral Oral  Resp: 18 18 18 18   Height:      Weight:      SpO2: 100% 99% 98% 92%    Intake/Output Summary (Last 24 hours) at 08/23/12 0902 Last data filed at 08/23/12 0700  Gross  per 24 hour  Intake 3564.67 ml  Output    400 ml  Net 3164.67 ml   Filed Weights   08/20/12 1908 08/21/12 0244  Weight: 75.297 kg (166 lb) 76.6 kg (168 lb 14 oz)   General appearance: alert, cooperative, appears stated age and no distress Resp: clear to auscultation bilaterally Cardio: S1, S2 tachy, regular. no murmur, click, rub or gallop GI: soft, non-tender; bowel sounds normal; no masses,  no organomegaly Extremities: extremities normal, atraumatic, no cyanosis or edema Neurologic: Alert and oriented x 3. No cranial nerve deficits. Motor strength was equal BL UE. 3-4/5 in LLE. SLR was negative.   Lab Results:  Basic Metabolic Panel:  Recent Labs Lab 08/20/12 2000 08/21/12 0440 08/22/12 0513  NA 133* 133* 132*  K 3.7 3.3* 3.7  CL 97 100 97  CO2 22 22 23   GLUCOSE 331* 140* 198*  BUN 24* 19 19  CREATININE 1.01 0.93 0.78  CALCIUM 9.5 9.3 9.3   Liver Function Tests:  Recent Labs Lab 08/20/12 2000 08/22/12 0513  AST 155* 61*  ALT 139* 84*  ALKPHOS 378* 316*  BILITOT 0.4 0.3  PROT 6.8 6.3  ALBUMIN 2.1* 2.0*   CBC:  Recent Labs Lab 08/20/12 2000 08/21/12 0440 08/22/12 0513  WBC 6.8 5.0 6.8  NEUTROABS 5.5  --   --   HGB 9.9* 8.5* 9.5*  HCT 30.2* 25.8*  28.2*  MCV 83.4 83.0 81.3  PLT 283 233 241   Cardiac Enzymes:  Recent Labs Lab 08/20/12 2000  CKTOTAL 27   BNP (last 3 results) No results found for this basename: PROBNP,  in the last 8760 hours CBG:  Recent Labs Lab 08/21/12 2124 08/22/12 0817 08/22/12 1200 08/22/12 1702 08/22/12 2144  GLUCAP 241* 168* 347* 329* 309*    No results found for this or any previous visit (from the past 240 hour(s)).    Studies/Results: No results found.  Medications:  Scheduled: . enoxaparin (LOVENOX) injection  40 mg Subcutaneous Daily  . [START ON 08/25/2012] enoxaparin (LOVENOX) injection  40 mg Subcutaneous Q24H  . feeding supplement  237 mL Oral TID BM  . insulin aspart  0-9 Units Subcutaneous TID  WC  . insulin glargine  10 Units Subcutaneous Daily  . insulin glargine  20 Units Subcutaneous QHS  . losartan  50 mg Oral Daily  . sodium chloride  3 mL Intravenous Q12H   Continuous: . sodium chloride 100 mL/hr at 08/23/12 2130   QMV:[id number], HYDROcodone-acetaminophen, HYDROmorphone (DILAUDID) injection, ondansetron (ZOFRAN) IV, ondansetron  Assessment/Plan:  Principal Problem:   Generalized weakness Active Problems:   Anemia of chronic disease   Transaminitis   Metastatic cancer   Protein-calorie malnutrition, severe   DM (diabetes mellitus), type 2, uncontrolled    Generalized weakness with mostly left lower extremity weakness  This is likely secondary to the widely metastatic disease noted in vertebrae. No evidence for cord compression. MRi Brain is pending. PT/OT. Pain control.   Metastatic disease of unknown primary  Reviewed all imaging reports. Appreciate Dr. Gustavo Lah input. CT guided Bx and Bone marrow bx to be done 7/7. Await reports from [facility] regarding previous diagnosis. Continue steroids for now as he has been on it for a while, though it could alter tissue diagnosis.  Sinus Tachycardia Likely secondary to pain. Patient denies chest pain or shortness of breath. Do not suspect other etiology. TSh was normal. Continue to monitor and control pain.  Thyroid Nodule TSH and FT4 are normal. Will need follow up for same as OP.  Transaminitis  Possibly due to Liver metastatic disease. Monitor.  Diabetes Mellitus 2 Made worse by steroids. A1c is 8.8. Continue SSI and Lantus. Add AM dose of Lantus.   Anemia of chronic disease  Anemia panel reviewed. Hgb stable. Transfuse as needed. No active bleeding noted.  History of HTN Monitor BP. Continue Losartan. Replete K. Add BB.  Code Status:  Full Code DVT Prophylaxis:   Enoxaprin Family Communication:  Discussed with patient and wife Disposition Plan: Pending further evaluation.    LOS: 3 days    [facility]  [facility] Pager [phone] 08/23/2012, 9:02 AM  If 8PM-8AM, please contact night-coverage at [email or web address], password [email or web address]

## 2012-08-23 NOTE — Progress Notes (Signed)
After bolus, pt HR decreased and has remained around 120 beats/min for rest of night.

## 2012-08-24 ENCOUNTER — Inpatient Hospital Stay (HOSPITAL_COMMUNITY): Payer: Medicare Other

## 2012-08-24 ENCOUNTER — Encounter (HOSPITAL_COMMUNITY): Payer: Self-pay | Admitting: Radiology

## 2012-08-24 DIAGNOSIS — I420 Dilated cardiomyopathy: Secondary | ICD-10-CM | POA: Diagnosis present

## 2012-08-24 LAB — COMPREHENSIVE METABOLIC PANEL
ALT: 49 U/L (ref 0–53)
AST: 49 U/L — ABNORMAL HIGH (ref 0–37)
Albumin: 1.8 g/dL — ABNORMAL LOW (ref 3.5–5.2)
Alkaline Phosphatase: 491 U/L — ABNORMAL HIGH (ref 39–117)
CO2: 23 mEq/L (ref 19–32)
Chloride: 94 mEq/L — ABNORMAL LOW (ref 96–112)
Creatinine, Ser: 0.76 mg/dL (ref 0.50–1.35)
GFR calc non Af Amer: >90 mL/min (ref >90)
Potassium: 3.7 mEq/L (ref 3.5–5.1)
Total Bilirubin: 0.3 mg/dL (ref 0.3–1.2)

## 2012-08-24 LAB — CBC
MCH: 26.8 pg (ref 26.0–34.0)
MCHC: 33 g/dL (ref 30.0–36.0)
MCV: 81.3 fL (ref 78.0–100.0)
Platelets: 232 10*3/uL (ref 150–400)
RBC: 3.36 MIL/uL — ABNORMAL LOW (ref 4.22–5.81)
RDW: 13.7 % (ref 11.5–15.5)

## 2012-08-24 LAB — GLUCOSE, CAPILLARY: Glucose-Capillary: 222 mg/dL — ABNORMAL HIGH (ref 70–99)

## 2012-08-24 LAB — LACTATE DEHYDROGENASE: LDH: 5328 U/L — ABNORMAL HIGH (ref 94–250)

## 2012-08-24 MED ORDER — SODIUM CHLORIDE 0.9 % IV SOLN
INTRAVENOUS | Status: AC
  Administered 2012-08-24: 11:00:00 via INTRAVENOUS

## 2012-08-24 MED ORDER — ALLOPURINOL 300 MG PO TABS
300.0000 mg | ORAL_TABLET | Freq: Every day | ORAL | Status: DC
  Administered 2012-08-24 – 2012-08-28 (×5): 300 mg via ORAL
  Filled 2012-08-24 (×5): qty 1

## 2012-08-24 MED ORDER — ACETAMINOPHEN 325 MG PO TABS: 650.0000 mg | ORAL_TABLET | ORAL | Status: DC | PRN

## 2012-08-24 MED ORDER — IBUPROFEN 800 MG PO TABS
400.0000 mg | ORAL_TABLET | Freq: Four times a day (QID) | ORAL | Status: DC | PRN
  Administered 2012-08-24: 400 mg via ORAL
  Filled 2012-08-24: qty 1

## 2012-08-24 MED ORDER — MIDAZOLAM HCL 2 MG/2ML IJ SOLN
INTRAMUSCULAR | Status: AC | PRN
  Administered 2012-08-24: 2 mg via INTRAVENOUS

## 2012-08-24 MED ORDER — GADOBENATE DIMEGLUMINE 529 MG/ML IV SOLN
15.0000 mL | Freq: Once | INTRAVENOUS | Status: AC | PRN
  Administered 2012-08-24: 15 mL via INTRAVENOUS

## 2012-08-24 MED ORDER — FENTANYL CITRATE 0.05 MG/ML IJ SOLN
INTRAMUSCULAR | Status: AC | PRN
  Administered 2012-08-24: 50 ug via INTRAVENOUS

## 2012-08-24 NOTE — Procedures (Signed)
Procedure:  CT guided left retroperitoneal mass core biopsy  Biopsy procedures discussed with patient and his wife.  I recommended RP nodal mass biopsy only today.  Should he tolerate procedure well, CT guided bone marrow biopsy can be performed in AM.    Findings:  Left RP LN mass sampled with 18 G core biopsy device via 17 G needle.  Total of 6 samples obtained.  No immediate complications.

## 2012-08-24 NOTE — Progress Notes (Signed)
Patient and family aware patient is NPO except sips of water with medications.

## 2012-08-24 NOTE — Progress Notes (Signed)
  Echocardiogram 2D Echocardiogram has been performed.  Keith Duffy 08/24/2012, 11:47 AM

## 2012-08-24 NOTE — Progress Notes (Addendum)
[facility] PROGRESS NOTE  Keith Duffy ZOX:[id number] DOB: 1945/12/25 DOA: 08/20/2012  PCP: Keith Duffy., MD  Brief HPI: Pt is 67 yo male who presented to [facility] ED with main concern of progressively worsening generalized weakness, that he initially noted several months prior to this admission, associated with lower back pain, constant and sharp, 10/10 in severity with no specific alleviating factors or aggravating factors. He explained his PCP started him on Prednisone and his sugar levels have been rather difficult to control. He explained he has lost 40 lbs over that past month and has not had much of an appetite. He stated he has had US of the liver and kidney done the day prior to admission and was told no acut findings noted. He had prior history of non -Hodgkin lymphoma in 1995 treated at [facility] but has been stable since. Pt also complained of left lower extremity weakness to a point where he has to lift his leg up to move it. In ED, pt had MRI done of the lumbar and thoracic spine and findings worrisome for metastatic disease of unknown primary. TRH asked to admit for further evaluation.   Past medical history:  Past Medical History  Diagnosis Date  . Back pain   . Hypertension   . Diabetes mellitus without complication    Consultants: Onc (Dr. [name]), IR  Procedures:  CT guided Bx of retroperitoneal LN 7/7 Ct Guided Bone marrow bx schedules for 7/8  Antibiotics: None  Subjective: Patient asleep after procedure. Arousable. Pain is not bad today.   Objective: Vital Signs  Filed Vitals:   08/24/12 0831 08/24/12 0848 08/24/12 0852 08/24/12 0855  BP: 122/84 125/74 127/70 123/72  Pulse: 130 129 126 128  Temp:      TempSrc:      Resp: 29 32 30 30  Height:      Weight:      SpO2: 96% 93% 95% 94%    Intake/Output Summary (Last 24 hours) at 08/24/12 0919 Last data filed at 08/24/12 0630  Gross per 24 hour  Intake    480 ml  Output    200 ml  Net    280 ml    Filed Weights   08/20/12 1908 08/21/12 0244  Weight: 75.297 kg (166 lb) 76.6 kg (168 lb 14 oz)   General appearance: alert, cooperative, appears stated age and no distress Resp: clear to auscultation bilaterally Cardio: S1, S2 tachy, regular. no murmur, click, rub or gallop GI: soft, non-tender; bowel sounds normal; no masses,  no organomegaly Extremities: extremities normal, atraumatic, no cyanosis or edema Neurologic: Alert and oriented x 3. No cranial nerve deficits. Motor strength was equal BL UE. 3-4/5 in LLE. SLR was negative.   Lab Results:  Basic Metabolic Panel:  Recent Labs Lab 08/20/12 2000 08/21/12 0440 08/22/12 0513 08/23/12 0525 08/24/12 0505  NA 133* 133* 132* 133* 129*  K 3.7 3.3* 3.7 4.2 3.7  CL 97 100 97 97 94*  CO2 22 22 23 21 23   GLUCOSE 331* 140* 198* 203* 266*  BUN 24* 19 19 15 15   CREATININE 1.01 0.93 0.78 0.74 0.76  CALCIUM 9.5 9.3 9.3 9.2 8.8   Liver Function Tests:  Recent Labs Lab 08/20/12 2000 08/22/12 0513 08/24/12 0505  AST 155* 61* 49*  ALT 139* 84* 49  ALKPHOS 378* 316* 491*  BILITOT 0.4 0.3 0.3  PROT 6.8 6.3 5.7*  ALBUMIN 2.1* 2.0* 1.8*   CBC:  Recent Labs Lab 08/20/12 2000 08/21/12 0440  08/22/12 0513 08/23/12 0525 08/24/12 0505  WBC 6.8 5.0 6.8 8.5 8.0  NEUTROABS 5.5  --   --   --   --   HGB 9.9* 8.5* 9.5* 9.7* 9.0*  HCT 30.2* 25.8* 28.2* 29.3* 27.3*  MCV 83.4 83.0 81.3 82.1 81.3  PLT 283 233 241 281 232   Cardiac Enzymes:  Recent Labs Lab 08/20/12 2000  CKTOTAL 27   BNP (last 3 results) No results found for this basename: PROBNP,  in the last 8760 hours CBG:  Recent Labs Lab 08/22/12 2144 08/23/12 0724 08/23/12 1144 08/23/12 1734 08/23/12 2217  GLUCAP 309* 196* 233* 162* 229*    No results found for this or any previous visit (from the past 240 hour(s)).    Studies/Results: No results found.  Medications:  Scheduled: . allopurinol  300 mg Oral Daily  . enoxaparin (LOVENOX) injection  40  mg Subcutaneous Daily  . [START ON 08/25/2012] enoxaparin (LOVENOX) injection  40 mg Subcutaneous Q24H  . feeding supplement  237 mL Oral TID BM  . insulin aspart  0-9 Units Subcutaneous TID WC  . insulin glargine  10 Units Subcutaneous Daily  . insulin glargine  20 Units Subcutaneous QHS  . losartan  50 mg Oral Daily  . metoprolol tartrate  25 mg Oral BID  . sodium chloride  3 mL Intravenous Q12H   Continuous: . sodium chloride     ZOX:[id number], HYDROcodone-acetaminophen, HYDROmorphone (DILAUDID) injection, ondansetron (ZOFRAN) IV, ondansetron  Assessment/Plan:  Principal Problem:   Generalized weakness Active Problems:   Anemia of chronic disease   Transaminitis   Metastatic cancer   Protein-calorie malnutrition, severe   DM (diabetes mellitus), type 2, uncontrolled    Generalized weakness with mostly left lower extremity weakness  This is likely secondary to the widely metastatic disease noted in vertebrae. No evidence for cord compression. MRi Brain is pending. PT/OT. Pain control.   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 Appreciate Dr. Gustavo Lah input. CT guided Bx and Bone marrow bx pending. Await reports from [facility] regarding previous diagnosis. Continue steroids for now as he has been on it for a while, though it could alter tissue diagnosis.  Sinus Tachycardia Likely secondary to pain. Patient denies chest pain or shortness of breath. TSh was normal. Continue to monitor and control pain. Continues to be tachycardic. Will get ECHO.  Hyponatremia ?dehydration vs related to cancer. Trial of IVF. Repeat labs in AM. Can check Urine osm though results may be unreliable as he has received fluids.  Thyroid Nodule TSH and FT4 are normal. Will need follow up for same as OP.  Transaminitis  Possibly due to Liver metastatic disease. Monitor.  Diabetes Mellitus 2 Made worse by steroids. A1c is 8.8. Continue SSI and Lantus. Added AM dose of Lantus. Adjust  as needed.  Anemia of chronic disease  Anemia panel reviewed. Hgb stable. Transfuse as needed. No active bleeding noted.  History of HTN Monitor BP. Continue Losartan. Replete K. Add BB.  Code Status:  Full Code DVT Prophylaxis:   Enoxaprin Family Communication:  Discussed with patient and wife Disposition Plan: Pending further evaluation.    LOS: 4 days   [facility]  [facility] Pager [phone] 08/24/2012, 9:19 AM  If 8PM-8AM, please contact night-coverage at [email or web address], password [email or web address]   ECHO shows EF of 40% with hypokinesis of anteroseptal wall. Will involve cardiology in AM. Patient is stable currently. Will also reduce IVF rate.  Keith Duffy 6:09 PM

## 2012-08-24 NOTE — Progress Notes (Signed)
Keith Duffy Is about the same. Still not eating much. He is down for his MRI this morning. This was biopsies today.  His LDH is over 5300. This is likely to be reflective of lymphoma. I suppose this could be a "Richter's-like" picture. He is not hemolyzing given his low reticulocyte count.  PSA is mildly elevated. His prealbumin is only 8.1.  His ferritin is over 7400. This is an acute phase reactant.  His LDH is gone up significantly over a period of 2 days.  His alkaline phosphatase is up. Progresses LFTs are normal.  Again, one would have to think that this is going to be an aggressive lymphoma. If so, we will need to start treatment as an inpatient. I started him on allopurinol. He would be at risk for tumor lysis given this markedly elevated LDH.  We will continue to follow closely. His nutritional state really needs to improve given that his prealbumin  is only 8.1. The dietitian is seen him which will be immensely helpful.  Keith Duffy  [name]4

## 2012-08-25 ENCOUNTER — Inpatient Hospital Stay (HOSPITAL_COMMUNITY): Payer: Medicare Other

## 2012-08-25 DIAGNOSIS — I428 Other cardiomyopathies: Secondary | ICD-10-CM

## 2012-08-25 DIAGNOSIS — R000 Tachycardia, unspecified: Secondary | ICD-10-CM | POA: Diagnosis present

## 2012-08-25 LAB — GLUCOSE, CAPILLARY
Glucose-Capillary: 184 mg/dL — ABNORMAL HIGH (ref 70–99)
Glucose-Capillary: 208 mg/dL — ABNORMAL HIGH (ref 70–99)
Glucose-Capillary: 305 mg/dL — ABNORMAL HIGH (ref 70–99)

## 2012-08-25 LAB — BASIC METABOLIC PANEL
CO2: 25 mEq/L (ref 19–32)
Chloride: 99 mEq/L (ref 96–112)
Creatinine, Ser: 0.83 mg/dL (ref 0.50–1.35)
GFR calc Af Amer: >90 mL/min (ref >90)
Sodium: 134 mEq/L — ABNORMAL LOW (ref 135–145)

## 2012-08-25 LAB — CBC
HCT: 27.7 % — ABNORMAL LOW (ref 39.0–52.0)
MCV: 82.9 fL (ref 78.0–100.0)
Platelets: 188 10*3/uL (ref 150–400)
RBC: 3.34 MIL/uL — ABNORMAL LOW (ref 4.22–5.81)
RDW: 14.1 % (ref 11.5–15.5)
WBC: 5.3 10*3/uL (ref 4.0–10.5)

## 2012-08-25 LAB — LACTATE DEHYDROGENASE: LDH: 4461 U/L — ABNORMAL HIGH (ref 94–250)

## 2012-08-25 MED ORDER — DEXAMETHASONE SODIUM PHOSPHATE 4 MG/ML IJ SOLN
12.0000 mg | Freq: Two times a day (BID) | INTRAMUSCULAR | Status: DC
  Administered 2012-08-25 – 2012-08-26 (×3): 12 mg via INTRAVENOUS
  Filled 2012-08-25 (×5): qty 3

## 2012-08-25 MED ORDER — TECHNETIUM TC 99M-LABELED RED BLOOD CELLS IV KIT
23.2000 | PACK | Freq: Once | INTRAVENOUS | Status: AC | PRN
  Administered 2012-08-25: 23.2 via INTRAVENOUS

## 2012-08-25 MED ORDER — POTASSIUM CHLORIDE CRYS ER 20 MEQ PO TBCR
40.0000 meq | EXTENDED_RELEASE_TABLET | Freq: Once | ORAL | Status: AC
  Administered 2012-08-25: 40 meq via ORAL
  Filled 2012-08-25 (×2): qty 2

## 2012-08-25 MED ORDER — CEFAZOLIN SODIUM-DEXTROSE 2-3 GM-% IV SOLR
2.0000 g | INTRAVENOUS | Status: AC
  Administered 2012-08-25: 2 g via INTRAVENOUS
  Filled 2012-08-25: qty 50

## 2012-08-25 MED ORDER — LIDOCAINE HCL 1 % IJ SOLN
INTRAMUSCULAR | Status: AC
  Filled 2012-08-25: qty 20

## 2012-08-25 MED ORDER — MIDAZOLAM HCL 2 MG/2ML IJ SOLN
INTRAMUSCULAR | Status: AC | PRN
  Administered 2012-08-25 (×2): 1 mg via INTRAVENOUS

## 2012-08-25 MED ORDER — FENTANYL CITRATE 0.05 MG/ML IJ SOLN
INTRAMUSCULAR | Status: AC | PRN
  Administered 2012-08-25: 100 ug via INTRAVENOUS

## 2012-08-25 NOTE — Progress Notes (Signed)
For bone marrow biopsy today Also received order for port placement. Discussed with pt and wife plan for chemotherapy and need for Port.  Since he is already going to be sedated for BM bx and has been NPO, we will proceed with port placement today as well. BP 126/73  Pulse 112  Temp(Src) 97.3 F (36.3 C) (Oral)  Resp 20  Ht 5\' 9"  (1.753 m)  Wt 168 lb 14 oz (76.6 kg)  BMI 24.93 kg/m2  SpO2 95%  Explained procedure, risks, complications. Consent signed for port placement.  Brayton El PA-C Interventional Radiology 08/25/2012 8:25 AM

## 2012-08-25 NOTE — H&P (Signed)
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 He was admitted 08/20/12 with weakness and wgt loss. He has a remote history on Non Hodgkin's Lymphoma but this had been stable. Work so far is suspicious for metastatic disease. An echo done this admission shows his EF to be depressed at 40% with an AS WMA. MUGA confirmed EF of 43% but showed no WMA. He denies any anginal symptoms.   PMHx:  Past Medical History  Diagnosis Date  . Back pain   . Hypertension   . Diabetes mellitus without complication    History reviewed. No pertinent past surgical history.  FAMHx: History reviewed. No pertinent family history.  SOCHx:  reports that he has never smoked. He does not have any smokeless tobacco history on file. He reports that he does not drink alcohol or use illicit drugs.  ALLERGIES: Allergies  Allergen Reactions  . Aspartame And Phenylalanine Diarrhea  . Codeine     ROS: Pertinent items are noted in HPI.  HOME MEDICATIONS: Prescriptions prior to admission  Medication Sig Dispense Refill  . cyclobenzaprine (FLEXERIL) 10 MG tablet Take 10 mg by mouth 3 (three) times daily as needed for muscle spasms.      . Insulin Glargine (LANTUS SOLOSTAR) 100 UNIT/ML SOPN Inject 20 Units into the skin every evening.      Marland Kitchen losartan (COZAAR) 50 MG tablet Take 50 mg by mouth daily.      Marland Kitchen oxycodone (OXY-IR) 5 MG capsule Take 5 mg by mouth every 6 (six) hours as needed for pain.        HOSPITAL MEDICATIONS: I have reviewed the patient's current medications.  VITALS: Blood pressure 108/62, pulse 108, temperature 97.4 F (36.3 C), temperature source Oral, resp. rate 20, height 5\' 9"  (1.753 m), weight 76.6 kg (168 lb 14 oz), SpO2 97.00%.  PHYSICAL EXAM: General appearance: alert, cooperative, no distress, pale and weak Neck: no carotid bruit and  no JVD Lungs: clear to auscultation bilaterally Heart: regular rythm Abdomen: soft, non-tender; bowel sounds normal; no masses,  no organomegaly Extremities: extremities normal, atraumatic, no cyanosis or edema Pulses: 2+ and symmetric Skin: Skin color, texture, turgor normal. No rashes or lesions Neurologic: Grossly normal  LABS: Results for orders placed during the hospital encounter of 08/20/12 (from the past 48 hour(s))  GLUCOSE, CAPILLARY     Status: Abnormal   Collection Time    08/23/12 10:17 PM      Result Value Range   Glucose-Capillary 229 (*) 70 - 99 mg/dL  LACTATE DEHYDROGENASE     Status: Abnormal   Collection Time    08/24/12  5:05 AM      Result Value Range   LDH 5328 (*) 94 - 250 U/L   Comment: RESULTS CONFIRMED BY MANUAL DILUTION  COMPREHENSIVE METABOLIC PANEL     Status: Abnormal   Collection Time    08/24/12  5:05 AM      Result Value Range   Sodium 129 (*) 135 - 145 mEq/L   Potassium 3.7  3.5 - 5.1 mEq/L   Chloride 94 (*) 96 - 112 mEq/L   CO2 23  19 - 32 mEq/L   Glucose, Bld 266 (*) 70 - 99 mg/dL   BUN 15  6 - 23 mg/dL   Creatinine, Ser 0.45  0.50 - 1.35 mg/dL   Calcium 8.8  8.4 -  10.5 mg/dL   Total Protein 5.7 (*) 6.0 - 8.3 g/dL   Albumin 1.8 (*) 3.5 - 5.2 g/dL   AST 49 (*) 0 - 37 U/L   ALT 49  0 - 53 U/L   Alkaline Phosphatase 491 (*) 39 - 117 U/L   Total Bilirubin 0.3  0.3 - 1.2 mg/dL   GFR calc non Af Amer >90  >90 mL/min   GFR calc Af Amer >90  >90 mL/min   Comment:            The eGFR has been calculated     using the CKD EPI equation.     This calculation has not been     validated in all clinical     situations.     eGFR's persistently     <90 mL/min signify     possible Chronic Kidney Disease.  CBC     Status: Abnormal   Collection Time    08/24/12  5:05 AM      Result Value Range   WBC 8.0  4.0 - 10.5 K/uL   RBC 3.36 (*) 4.22 - 5.81 MIL/uL   Hemoglobin 9.0 (*) 13.0 - 17.0 g/dL   HCT 57.8 (*) 46.9 - 62.9 %   MCV 81.3  78.0 -  100.0 fL   MCH 26.8  26.0 - 34.0 pg   MCHC 33.0  30.0 - 36.0 g/dL   RDW 52.8  41.3 - 24.4 %   Platelets 232  150 - 400 K/uL  GLUCOSE, CAPILLARY     Status: Abnormal   Collection Time    08/24/12  7:55 AM      Result Value Range   Glucose-Capillary 249 (*) 70 - 99 mg/dL  GLUCOSE, CAPILLARY     Status: Abnormal   Collection Time    08/24/12 11:35 AM      Result Value Range   Glucose-Capillary 222 (*) 70 - 99 mg/dL   Comment 1 Notify RN    GLUCOSE, CAPILLARY     Status: Abnormal   Collection Time    08/24/12  2:58 PM      Result Value Range   Glucose-Capillary 207 (*) 70 - 99 mg/dL   Comment 1 Notify RN    GLUCOSE, CAPILLARY     Status: Abnormal   Collection Time    08/24/12  4:46 PM      Result Value Range   Glucose-Capillary 249 (*) 70 - 99 mg/dL   Comment 1 Notify RN    GLUCOSE, CAPILLARY     Status: Abnormal   Collection Time    08/24/12 11:07 PM      Result Value Range   Glucose-Capillary 232 (*) 70 - 99 mg/dL  LACTATE DEHYDROGENASE     Status: Abnormal   Collection Time    08/25/12  5:20 AM      Result Value Range   LDH 4461 (*) 94 - 250 U/L   Comment: RESULTS CONFIRMED BY MANUAL DILUTION  BASIC METABOLIC PANEL     Status: Abnormal   Collection Time    08/25/12  5:20 AM      Result Value Range   Sodium 134 (*) 135 - 145 mEq/L   Potassium 3.3 (*) 3.5 - 5.1 mEq/L   Chloride 99  96 - 112 mEq/L   CO2 25  19 - 32 mEq/L   Glucose, Bld 218 (*) 70 - 99 mg/dL   BUN 20  6 - 23 mg/dL   Creatinine, Ser  0.83  0.50 - 1.35 mg/dL   Calcium 9.3  8.4 - 16.1 mg/dL   GFR calc non Af Amer >90  >90 mL/min   GFR calc Af Amer >90  >90 mL/min   Comment:            The eGFR has been calculated     using the CKD EPI equation.     This calculation has not been     validated in all clinical     situations.     eGFR's persistently     <90 mL/min signify     possible Chronic Kidney Disease.  CBC     Status: Abnormal   Collection Time    08/25/12  5:20 AM      Result Value Range    WBC 5.3  4.0 - 10.5 K/uL   RBC 3.34 (*) 4.22 - 5.81 MIL/uL   Hemoglobin 8.9 (*) 13.0 - 17.0 g/dL   HCT 09.6 (*) 04.5 - 40.9 %   MCV 82.9  78.0 - 100.0 fL   MCH 26.6  26.0 - 34.0 pg   MCHC 32.1  30.0 - 36.0 g/dL   RDW 81.1  91.4 - 78.2 %   Platelets 188  150 - 400 K/uL  BONE MARROW EXAM     Status: None   Collection Time    08/25/12  8:40 AM      Result Value Range   Bone Marrow Exam SEE PATHOLOGY REPORT [id number]    GLUCOSE, CAPILLARY     Status: Abnormal   Collection Time    08/25/12 10:11 AM      Result Value Range   Glucose-Capillary 184 (*) 70 - 99 mg/dL   Comment 1 Notify RN     Comment 2 Documented in Chart    GLUCOSE, CAPILLARY     Status: Abnormal   Collection Time    08/25/12 11:34 AM      Result Value Range   Glucose-Capillary 208 (*) 70 - 99 mg/dL   Comment 1 Notify RN    GLUCOSE, CAPILLARY     Status: Abnormal   Collection Time    08/25/12  4:37 PM      Result Value Range   Glucose-Capillary 262 (*) 70 - 99 mg/dL    EKG: NSR/ST  IMAGING:   Nm Cardiac Muga Rest  08/25/2012   *RADIOLOGY REPORT*  Clinical Data: Non-Hodgkins lymphoma, pre toxic chemotherapy  NUCLEAR MEDICINE CARDIAC MULTIPLE UPTAKE GATED ACQUISITION SCAN (CARDIAC MUGA)  Technique: Radiolabeled red blood cells used to perform resting radionuclide ventriculography. Imaging performed in the anterior, LAO, and lateral projections.  Resting left ventricular ejection fraction estimated after drawing region of interest curves around the left ventricle during systole and diastole.  Radiopharmaceutical: 25 mCi Tc-44m pertechnetate labeled autologous red cells  Comparison: None  Findings: Left ventricular ejection fraction is calculated at 43%, below the normal range. Acquisition performed at a cardiac rate of 110 beats per minute. Wall motion analysis in three projections shows no focal wall motion abnormalities.  IMPRESSION: Mildly decreased left ventricular ejection fraction of 43% without focal wall motion  abnormalities.   Original Report Authenticated By: Ulyses Southward, M.D.      Ct Biopsy  08/24/2012   *RADIOLOGY REPORT*  Clinical Data: Left retroperitoneal lymph node mass and history of non-Hodgkin's lymphoma.  CT GUIDED CORE BIOPSY OF RETROPERITONEAL MASS IMPRESSION: CT guided core biopsy performed of a left para-aortic retroperitoneal lymph node mass.   Original Report Authenticated By:  Irish Lack, M.D.    IMPRESSION: Principal Problem:   Generalized weakness Active Problems:   Metastatic cancer   Protein-calorie malnutrition, severe   DM (diabetes mellitus), type 2, uncontrolled   Dilated cardiomyopathy   Anemia of chronic disease   Transaminitis   Sinus tachycardia   RECOMMENDATION: Dr Allyson Sabal to see. TSH WNL. HR is better with Metoprolol 25mg  BID. He is also on Cozaar 50mg . Doubt he is candidate for further aggressive cardiac work up at this time. Consider Myoview to r/o ischemic heart disease when he is more stable.   Time Spent Directly with Patient: 45 minutes  Abelino Derrick [phone] beeper 08/25/2012, 6:17 PM  Agree with note written by [facility]  ATSP with metastatic lymphoma and decreased LVEF with WMA. He has a paucity of CRF and is essentially w/o symptoms. There is no recent Chemo Rx to contribute to his EF decrease. He is tachycardic (ST) prob related to dehydration and is on a BB and ARB. Exam benign. At this point no further w/u necessary until his long term outlook wrt his malignancy is clarified. He could get a stress test as an out pt after D/C.  Runell Gess 08/25/2012 7:39 PM

## 2012-08-25 NOTE — Procedures (Signed)
Successful CT guided bone marrow aspiration and core biopsy.  Placement of right jugular Port.  Tip in SVC and ready to use.   No immediate complication.

## 2012-08-25 NOTE — Progress Notes (Signed)
Status is about the same. He did have his lymph node biopsy yesterday. Possibly, there might be a preliminary results today. He is going for his bone marrow biopsy today.  Having sweats. Have a low-grade fevers. Everything is pointing toward lymphoma. One would have to suspect that this is a aggressive lymphoma.  His echocardiogram surprisingly shows a reduced left ventricular ejection fraction. We will get a MUGA scan on him to see if this gives [place] a better idea of his cardiac function.  His appetite is still not back good. His LDH is 4400. Hemoglobin 8.9. White cell count 5.3.  I think a Port-A-Cath also will be necessary. We will get this set up to be placed.   His vital signs are okay. No change on his overall physical exam.  I spoke to his wife. I suspect that we will get a diagnosis this week. We will proceed with therapy, which will be chemotherapy, once we have a diagnosis and the Port-A-Cath is placed.  Gypsy Lore 1: 7-9

## 2012-08-25 NOTE — Progress Notes (Signed)
[facility] PROGRESS NOTE  Keith Duffy ZOX:[id number] DOB: [birth year] DOA: 08/20/2012  PCP: [name], MD  Brief HPI: Pt is 67 yo male who presented to [facility] ED with main concern of progressively worsening generalized weakness, that he initially noted several months prior to this admission, associated with lower back pain, constant and sharp, 10/10 in severity with no specific alleviating factors or aggravating factors. He explained his PCP started him on Prednisone and his sugar levels have been rather difficult to control. He explained he has lost 40 lbs over that past month and has not had much of an appetite. He stated he has had US of the liver and kidney done the day prior to admission and was told no acut findings noted. He had prior history of non -Hodgkin lymphoma in 1995 treated at [facility] but has been stable since. Pt also complained of left lower extremity weakness to a point where he has to lift his leg up to move it. In ED, pt had MRI done of the lumbar and thoracic spine and findings worrisome for metastatic disease of unknown primary. [facility] asked to admit for further evaluation.   Past medical history:  Past Medical History  Diagnosis Date  . Back pain   . Hypertension   . Diabetes mellitus without complication    Consultants: Onc (Dr. [name]), IR, [facility]  Procedures:  CT guided Bx of retroperitoneal LN 7/7 Ct Guided Bone marrow bx schedules for 7/8  2D ECHO 7/7 Study Conclusions - Left ventricle: The cavity size was normal. Systolic function was normal. The estimated ejection fraction was 40%. There is severe hypokinesis of the mid-distalanteroseptal myocardium. - Aortic valve: Trivial regurgitation.  Antibiotics: None  Subjective: Patient denies any pain. Feeling tired. Denies chest pain.  Objective: Vital Signs  Filed Vitals:   08/25/12 0930 08/25/12 0938 08/25/12 1013 08/25/12 1057  BP: 130/86 135/86 124/69 112/60  Pulse: 106 105 108 111  Temp:   97.6  F (36.4 C) 97.5 F (36.4 C)  TempSrc:   Oral   Resp: 27 25 22 20   Height:      Weight:      SpO2: 98% 99% 96%     Intake/Output Summary (Last 24 hours) at 08/25/12 1230 Last data filed at 08/25/12 0925  Gross per 24 hour  Intake 990.33 ml  Output    100 ml  Net 890.33 ml   Filed Weights   08/20/12 1908 08/21/12 0244  Weight: 75.297 kg (166 lb) 76.6 kg (168 lb 14 oz)   General appearance: alert, cooperative, appears stated age and no distress Resp: clear to auscultation bilaterally Cardio: S1, S2 tachy, regular. no murmur, click, rub or gallop GI: soft, non-tender; bowel sounds normal; no masses,  no organomegaly Extremities: extremities normal, atraumatic, no cyanosis or edema Neurologic: Alert and oriented x 3. No cranial nerve deficits. Motor strength was equal BL UE. 3-4/5 in LLE. SLR was negative.   Lab Results:  Basic Metabolic Panel:  Recent Labs Lab 08/21/12 0440 08/22/12 0513 08/23/12 0525 08/24/12 0505 08/25/12 0520  NA 133* 132* 133* 129* 134*  K 3.3* 3.7 4.2 3.7 3.3*  CL 100 97 97 94* 99  CO2 22 23 21 23 25   GLUCOSE 140* 198* 203* 266* 218*  BUN 19 19 15 15 20   CREATININE 0.93 0.78 0.74 0.76 0.83  CALCIUM 9.3 9.3 9.2 8.8 9.3   Liver Function Tests:  Recent Labs Lab 08/20/12 2000 08/22/12 0513 08/24/12 0505  AST 155* 61* 49*  ALT 139* 84* 49  ALKPHOS 378* 316* 491*  BILITOT 0.4 0.3 0.3  PROT 6.8 6.3 5.7*  ALBUMIN 2.1* 2.0* 1.8*   CBC:  Recent Labs Lab 08/20/12 2000 08/21/12 0440 08/22/12 0513 08/23/12 0525 08/24/12 0505 08/25/12 0520  WBC 6.8 5.0 6.8 8.5 8.0 5.3  NEUTROABS 5.5  --   --   --   --   --   HGB 9.9* 8.5* 9.5* 9.7* 9.0* 8.9*  HCT 30.2* 25.8* 28.2* 29.3* 27.3* 27.7*  MCV 83.4 83.0 81.3 82.1 81.3 82.9  PLT 283 233 241 281 232 188   Cardiac Enzymes:  Recent Labs Lab 08/20/12 2000  CKTOTAL 27   BNP (last 3 results) No results found for this basename: PROBNP,  in the last 8760 hours CBG:  Recent Labs Lab  08/24/12 1458 08/24/12 1646 08/24/12 2307 08/25/12 1011 08/25/12 1134  GLUCAP 207* 249* 232* 184* 208*    No results found for this or any previous visit (from the past 240 hour(s)).    Studies/Results: Mr Laqueta Jean RU Contrast  08/24/2012   *RADIOLOGY REPORT*  Clinical Data: 67 year old male with remote history of non-Hodgkins lymphoma. Abnormal abdominal lymph node recently biopsied. Confusion. Staging.  MRI HEAD WITHOUT AND WITH CONTRAST  Technique:  Multiplanar, multiecho pulse sequences of the brain and surrounding structures were obtained according to standard protocol without and with intravenous contrast  Contrast: 15mL MULTIHANCE GADOBENATE DIMEGLUMINE 529 MG/ML IV SOLN  Comparison: None.  Findings: Cerebral volume is within normal limits for age.  No restricted diffusion to suggest acute infarction.  No midline shift, mass effect, evidence of mass lesion, ventriculomegaly, extra-axial collection or acute intracranial hemorrhage. Cervicomedullary junction and pituitary are within normal limits. Negative visualized cervical spinal cord.  Major intracranial vascular flow voids are preserved.  Left lentiform nuclei T2 and FLAIR hyperintensity compatible with small chronic lacunar infarcts.  Elsewhere, normal gray and white matter signal for age.  No abnormal enhancement of the brain.  Incidental right anterior frontal lobe developmental venous anomaly.  A punctate focus of high signal in the superior cerebellum following contrast seen only on sagittal image 14 (series 13) is felt to be vascular related.  Bone marrow signal is heterogeneous and abnormal in the clivus, the occipital condyles, and throughout the visualized cervical spine. The calvarium appears spared at this time.  No overtly destructive osseous lesion is identified.  Visualized orbit soft tissues are within normal limits.  Visualized paranasal sinuses and mastoids are clear.  Grossly normal visualized internal auditory structures.   Negative scalp soft tissues.  IMPRESSION: 1. No acute intracranial abnormality or metastatic disease to the brain identified. 2.  Abnormal bone marrow signal at the skull base and in the cervical spine is suspicious for lymphomatous involvement in this setting, but red marrow reactivation due to anemia could also have this appearance. 3. Mild for age chronic small vessel ischemia.   Original Report Authenticated By: Erskine Speed, M.D.   Ir Fluoro Guide Cv Line Right  08/25/2012   *RADIOLOGY REPORT*  Clinical Data: History of lymphoma.  Needs Port-A-Cath for chemotherapy.  FLUOROSCOPIC AND ULTRASOUND GUIDED PLACEMENT OF A SUBCUTANEOUS PORT.  Physician: Rachelle Hora. Lowella Dandy, MD  Medications:The patient was sedated for the bone marrow biopsy that immediately proceeded this procedure. A radiology nurse monitored the patient for moderate sedation.  Ancef 2 gm.   As antibiotic prophylaxis, Ancef  was ordered pre- procedure and administered intravenously within one hour of incision.  Moderate sedation time:25 minutes  Fluoroscopy time: 24 seconds  Procedure:  The risks of the procedure were explained to the patient.  Informed consent was obtained.  Patient was placed supine on the interventional table.  Ultrasound confirmed a patent right internal jugular vein.  The right chest and neck were cleaned with a skin antiseptic and a sterile drape was placed.  Maximal barrier sterile technique was utilized including caps, mask, sterile gowns, sterile gloves, sterile drape, hand hygiene and skin antiseptic. The right neck was anesthetized with 1% lidocaine.  Small incision was made in the right neck with a blade.  Micropuncture set was placed in the right IJ with ultrasound guidance.  The micropuncture wire was used for measurement purposes.  The right chest was anesthetized with 1% lidocaine with epinephrine.  #15 blade was used to make an incision and a subcutaneous port pocket was formed. 8 french Power Port was assembled.   Subcutaneous tunnel was formed with a stiff tunneling device.  The port catheter was brought through the subcutaneous tunnel.  The port was placed in the subcutaneous pocket.  The micropuncture set was exchanged for a peel-away sheath.  The catheter was placed through the peel-away sheath and the tip was positioned in the SVC.  Catheter placement was confirmed with fluoroscopy.  The port was accessed and flushed with heparinized saline.  The port pocket was closed using two layers of absorbable sutures and Dermabond.  The vein skin site was closed using a single layer of absorbable suture and Dermabond. Sterile dressings were applied.  Patient tolerated the procedure well without an immediate complication.  Ultrasound and fluoroscopic images were taken and saved for this procedure.  Complications: None  Impression:  Placement of a subcutaneous port device.  The catheter tip is in the SVC and ready to be used.   Original Report Authenticated By: Richarda Overlie, M.D.   Ir US Guide Vasc Access Right  08/25/2012   *RADIOLOGY REPORT*  Clinical Data: History of lymphoma.  Needs Port-A-Cath for chemotherapy.  FLUOROSCOPIC AND ULTRASOUND GUIDED PLACEMENT OF A SUBCUTANEOUS PORT.  Physician: Rachelle Hora. Lowella Dandy, MD  Medications:The patient was sedated for the bone marrow biopsy that immediately proceeded this procedure. A radiology nurse monitored the patient for moderate sedation.  Ancef 2 gm.   As antibiotic prophylaxis, Ancef  was ordered pre- procedure and administered intravenously within one hour of incision.  Moderate sedation time:25 minutes  Fluoroscopy time: 24 seconds  Procedure:  The risks of the procedure were explained to the patient.  Informed consent was obtained.  Patient was placed supine on the interventional table.  Ultrasound confirmed a patent right internal jugular vein.  The right chest and neck were cleaned with a skin antiseptic and a sterile drape was placed.  Maximal barrier sterile technique was utilized  including caps, mask, sterile gowns, sterile gloves, sterile drape, hand hygiene and skin antiseptic. The right neck was anesthetized with 1% lidocaine.  Small incision was made in the right neck with a blade.  Micropuncture set was placed in the right IJ with ultrasound guidance.  The micropuncture wire was used for measurement purposes.  The right chest was anesthetized with 1% lidocaine with epinephrine.  #15 blade was used to make an incision and a subcutaneous port pocket was formed. 8 french Power Port was assembled.  Subcutaneous tunnel was formed with a stiff tunneling device.  The port catheter was brought through the subcutaneous tunnel.  The port was placed in the subcutaneous pocket.  The micropuncture set was exchanged  for a peel-away sheath.  The catheter was placed through the peel-away sheath and the tip was positioned in the SVC.  Catheter placement was confirmed with fluoroscopy.  The port was accessed and flushed with heparinized saline.  The port pocket was closed using two layers of absorbable sutures and Dermabond.  The vein skin site was closed using a single layer of absorbable suture and Dermabond. Sterile dressings were applied.  Patient tolerated the procedure well without an immediate complication.  Ultrasound and fluoroscopic images were taken and saved for this procedure.  Complications: None  Impression:  Placement of a subcutaneous port device.  The catheter tip is in the SVC and ready to be used.   Original Report Authenticated By: Richarda Overlie, M.D.   Ct Biopsy  08/25/2012   *RADIOLOGY REPORT*  Clinical history: 67 year old with anemia and history of lymphoma.   PROCEDURE(S): CT GUIDED BONE MARROW ASPIRATES AND BIOPSY  Physician: Rachelle Hora. Henn, MD   Medications: Versed 2 mg, Fentanyl 100 mcg.  A radiology nurse monitored the patient for moderate sedation.  Sedation time:  25 minutes   Procedure: The procedure was explained to the patient. The risks and benefits of the procedure were  discussed and the patient's questions were addressed.  Informed consent was obtained from the patient. The patient was placed prone on CT scan. Images of the pelvis were obtained. The right side of back was prepped and draped in sterile fashion. The skin and right posterior iliac bone were anesthetized with 1% lidocaine.   11 gauge bone needle was directed into the right iliac bone with CT guidance.  It was very difficult to obtain aspirate material.  A total of four aspirates were obtained.  Two core biopsies were performed and one adequate core specimen was obtained.  Findings:Biopsy needle directed into the posterior right iliac bone.  Complications: None   Impression: CT guided bone marrow aspirates and core biopsy.   Original Report Authenticated By: Richarda Overlie, M.D.   Ct Biopsy  08/24/2012   *RADIOLOGY REPORT*  Clinical Data: Left retroperitoneal lymph node mass and history of non-Hodgkin's lymphoma.  CT GUIDED CORE BIOPSY OF RETROPERITONEAL MASS  Sedation:   2.0 mg IV Versed;  50 mcg IV Fentanyl  Total Moderate Sedation Time: 20 minutes.  Procedure:  The procedure risks, benefits, and alternatives were explained to the patient.  Questions regarding the procedure were encouraged and answered.  The patient understands and consents to the procedure.  The left paraspinous region was prepped with Betadine in a sterile fashion, and a sterile drape was applied covering the operative field.  A sterile gown and sterile gloves were used for the procedure.  Local anesthesia was provided with 1% Lidocaine.  CT was performed in a prone position.  Under CT guidance, a 17 gauge trocar needle was advanced from a left paraspinous approach into the retroperitoneal space.  After confirming needle tip position, coaxial 18-gauge core biopsy samples were obtained and submitted in saline.  A total of six core biopsy samples were obtained and submitted.  Complications: None  Findings: Left-sided para-aortic retroperitoneal lymph  node mass measures approximately 2.6 x 5.5 cm.  Solid tissue was obtained. Postbiopsy CT imaging shows no evidence of hemorrhage or other complication immediately following the procedure.  IMPRESSION: CT guided core biopsy performed of a left para-aortic retroperitoneal lymph node mass.   Original Report Authenticated By: Irish Lack, M.D.    Medications:  Scheduled: . allopurinol  300 mg Oral Daily  .  dexamethasone  12 mg Intravenous Q12H  . enoxaparin (LOVENOX) injection  40 mg Subcutaneous Q24H  . feeding supplement  237 mL Oral TID BM  . insulin aspart  0-9 Units Subcutaneous TID WC  . insulin glargine  10 Units Subcutaneous Daily  . insulin glargine  20 Units Subcutaneous QHS  . lidocaine      . losartan  50 mg Oral Daily  . metoprolol tartrate  25 mg Oral BID  . sodium chloride  3 mL Intravenous Q12H   Continuous:   GNF:[id number], HYDROcodone-acetaminophen, HYDROmorphone (DILAUDID) injection, ibuprofen, ondansetron (ZOFRAN) IV, ondansetron  Assessment/Plan:  Principal Problem:   Generalized weakness Active Problems:   Anemia of chronic disease   Transaminitis   Metastatic cancer   Protein-calorie malnutrition, severe   DM (diabetes mellitus), type 2, uncontrolled   Dilated cardiomyopathy    Generalized weakness with mostly left lower extremity weakness  This is likely secondary to the widely metastatic disease noted in vertebrae. No evidence for cord compression. MRi Brain is unremarkable. PT/OT. Pain control.   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 Appreciate Dr. Gustavo Lah input. CT guided Bx and Bone marrow bx pending. Await reports from [facility] regarding previous diagnosis. Continue steroids for now. Dr. [name] plans to start chemotherapy while patient is in hospital.  Sinus Tachycardia/Cardiomyopathy ECHO reveals cardiomyopathy with hypokinesis. He has risk factors for CAD. He will be getting chemotherapy for lymphoma and some of those  agents could affect cardiac function. Will consult cardiology [facility]). Continue beta blocker for now. Patient denies chest pain or shortness of breath. TSh was normal. Stopped IVF.  Hyponatremia/Hypokalemia ?dehydration vs related to cancer. Improvement with trial of IVF. Can check Urine osm though results may be unreliable as he has received fluids. Replete K.  Thyroid Nodule TSH and FT4 are normal. Will need follow up for same as OP.  Transaminitis  Possibly due to Liver metastatic disease. Monitor.  Diabetes Mellitus 2 Made worse by steroids. A1c is 8.8. Continue SSI and Lantus. Added AM dose of Lantus. Adjust as needed.  Anemia of chronic disease  Anemia panel reviewed. Hgb stable. Transfuse as needed. No active bleeding noted.  History of HTN Monitor BP. Continue Losartan. Replete K. Add BB.  Code Status:  Full Code DVT Prophylaxis:   Enoxaprin Family Communication:  Discussed with patient and wife Disposition Plan: Pending further evaluation.    LOS: 5 days   [facility]  [facility] Pager [phone] 08/25/2012, 12:30 PM  If 8PM-8AM, please contact night-coverage at [email or web address], password [facility]

## 2012-08-26 DIAGNOSIS — C8589 Other specified types of non-Hodgkin lymphoma, extranodal and solid organ sites: Principal | ICD-10-CM

## 2012-08-26 DIAGNOSIS — C8590 Non-Hodgkin lymphoma, unspecified, unspecified site: Secondary | ICD-10-CM | POA: Diagnosis present

## 2012-08-26 LAB — HEMOGLOBIN A1C
Hgb A1c MFr Bld: 8.9 % — ABNORMAL HIGH (ref <5.7)
Mean Plasma Glucose: 209 mg/dL — ABNORMAL HIGH (ref <117)

## 2012-08-26 LAB — GLUCOSE, CAPILLARY
Glucose-Capillary: 348 mg/dL — ABNORMAL HIGH (ref 70–99)
Glucose-Capillary: 379 mg/dL — ABNORMAL HIGH (ref 70–99)

## 2012-08-26 MED ORDER — ALTEPLASE 2 MG IJ SOLR: 2.0000 mg | Freq: Once | INTRAMUSCULAR | Status: AC | PRN

## 2012-08-26 MED ORDER — EPINEPHRINE HCL 1 MG/ML IJ SOLN: 0.5000 mg | Freq: Once | INTRAMUSCULAR | Status: AC | PRN

## 2012-08-26 MED ORDER — SODIUM CHLORIDE 0.9 % IV SOLN
750.0000 mg/m2 | Freq: Once | INTRAVENOUS | Status: AC
  Administered 2012-08-26: 1440 mg via INTRAVENOUS
  Filled 2012-08-26: qty 72

## 2012-08-26 MED ORDER — ALBUTEROL SULFATE (5 MG/ML) 0.5% IN NEBU: 2.5000 mg | INHALATION_SOLUTION | Freq: Once | RESPIRATORY_TRACT | Status: AC | PRN

## 2012-08-26 MED ORDER — DIPHENHYDRAMINE HCL 50 MG/ML IJ SOLN: 50.0000 mg | Freq: Once | INTRAMUSCULAR | Status: AC | PRN

## 2012-08-26 MED ORDER — EPINEPHRINE HCL 0.1 MG/ML IJ SOLN: 0.2500 mg | Freq: Once | INTRAMUSCULAR | Status: AC | PRN

## 2012-08-26 MED ORDER — HOT PACK MISC ONCOLOGY
1.0000 | Freq: Once | Status: AC | PRN
  Filled 2012-08-26: qty 1

## 2012-08-26 MED ORDER — PREDNISONE 50 MG PO TABS
80.0000 mg | ORAL_TABLET | Freq: Every day | ORAL | Status: DC
  Administered 2012-08-26 – 2012-08-28 (×3): 80 mg via ORAL
  Filled 2012-08-26 (×4): qty 1

## 2012-08-26 MED ORDER — HEPARIN SOD (PORK) LOCK FLUSH 100 UNIT/ML IV SOLN
250.0000 [IU] | Freq: Once | INTRAVENOUS | Status: AC | PRN
  Filled 2012-08-26: qty 3

## 2012-08-26 MED ORDER — SODIUM CHLORIDE 0.9 % IV SOLN
Freq: Once | INTRAVENOUS | Status: AC
  Administered 2012-08-26: 16 mg via INTRAVENOUS
  Filled 2012-08-26: qty 8

## 2012-08-26 MED ORDER — INSULIN ASPART 100 UNIT/ML ~~LOC~~ SOLN
0.0000 [IU] | Freq: Three times a day (TID) | SUBCUTANEOUS | Status: DC
  Administered 2012-08-26: 15 [IU] via SUBCUTANEOUS
  Administered 2012-08-26: 11 [IU] via SUBCUTANEOUS
  Administered 2012-08-27: 8 [IU] via SUBCUTANEOUS
  Administered 2012-08-27: 3 [IU] via SUBCUTANEOUS
  Administered 2012-08-27: 8 [IU] via SUBCUTANEOUS
  Administered 2012-08-28: 11 [IU] via SUBCUTANEOUS

## 2012-08-26 MED ORDER — SULFAMETHOXAZOLE-TMP DS 800-160 MG PO TABS
1.0000 | ORAL_TABLET | ORAL | Status: DC
  Administered 2012-08-26 – 2012-08-28 (×2): 1 via ORAL
  Filled 2012-08-26 (×2): qty 1

## 2012-08-26 MED ORDER — FAMOTIDINE IN NACL 20-0.9 MG/50ML-% IV SOLN: 20.0000 mg | Freq: Once | INTRAVENOUS | Status: AC | PRN

## 2012-08-26 MED ORDER — LACTATED RINGERS IV SOLN
500.0000 mg/m2 | Freq: Once | INTRAVENOUS | Status: AC
  Administered 2012-08-26: 970 mg via INTRAVENOUS
  Filled 2012-08-26: qty 97

## 2012-08-26 MED ORDER — SODIUM CHLORIDE 0.9 % IJ SOLN: 3.0000 mL | INTRAMUSCULAR | Status: DC | PRN

## 2012-08-26 MED ORDER — DOXORUBICIN HCL CHEMO IV INJECTION 2 MG/ML
50.0000 mg/m2 | Freq: Once | INTRAVENOUS | Status: AC
  Administered 2012-08-26: 96 mg via INTRAVENOUS
  Filled 2012-08-26: qty 48

## 2012-08-26 MED ORDER — VINCRISTINE SULFATE CHEMO INJECTION 1 MG/ML
2.0000 mg | Freq: Once | INTRAVENOUS | Status: AC
  Administered 2012-08-26: 2 mg via INTRAVENOUS
  Filled 2012-08-26: qty 2

## 2012-08-26 MED ORDER — ACETAMINOPHEN 325 MG PO TABS
650.0000 mg | ORAL_TABLET | Freq: Once | ORAL | Status: AC
  Administered 2012-08-26: 650 mg via ORAL
  Filled 2012-08-26: qty 2

## 2012-08-26 MED ORDER — DEXAMETHASONE SODIUM PHOSPHATE 4 MG/ML IJ SOLN: 12.0000 mg | Freq: Two times a day (BID) | INTRAMUSCULAR | Status: DC

## 2012-08-26 MED ORDER — SODIUM CHLORIDE 0.9 % IJ SOLN: 10.0000 mL | INTRAMUSCULAR | Status: DC | PRN

## 2012-08-26 MED ORDER — INSULIN ASPART 100 UNIT/ML ~~LOC~~ SOLN: 0.0000 [IU] | Freq: Every day | SUBCUTANEOUS | Status: DC

## 2012-08-26 MED ORDER — COLD PACK MISC ONCOLOGY
1.0000 | Freq: Once | Status: AC | PRN
  Filled 2012-08-26: qty 1

## 2012-08-26 MED ORDER — DIPHENHYDRAMINE HCL 50 MG PO CAPS
50.0000 mg | ORAL_CAPSULE | Freq: Once | ORAL | Status: AC
  Administered 2012-08-26: 50 mg via ORAL
  Filled 2012-08-26: qty 1

## 2012-08-26 MED ORDER — SODIUM CHLORIDE 0.9 % IV SOLN
Freq: Once | INTRAVENOUS | Status: AC
  Administered 2012-08-26: 15:00:00 via INTRAVENOUS

## 2012-08-26 MED ORDER — METHYLPREDNISOLONE SODIUM SUCC 125 MG IJ SOLR: 125.0000 mg | Freq: Once | INTRAMUSCULAR | Status: AC | PRN

## 2012-08-26 MED ORDER — HEPARIN SOD (PORK) LOCK FLUSH 100 UNIT/ML IV SOLN
500.0000 [IU] | Freq: Once | INTRAVENOUS | Status: AC | PRN
  Filled 2012-08-26: qty 5

## 2012-08-26 MED ORDER — DIPHENHYDRAMINE HCL 50 MG/ML IJ SOLN: 25.0000 mg | Freq: Once | INTRAMUSCULAR | Status: AC | PRN

## 2012-08-26 MED ORDER — INSULIN ASPART 100 UNIT/ML ~~LOC~~ SOLN
4.0000 [IU] | Freq: Three times a day (TID) | SUBCUTANEOUS | Status: DC
  Administered 2012-08-26 – 2012-08-27 (×3): 4 [IU] via SUBCUTANEOUS

## 2012-08-26 MED ORDER — FAMCICLOVIR 500 MG PO TABS
250.0000 mg | ORAL_TABLET | Freq: Every day | ORAL | Status: DC
  Administered 2012-08-26 – 2012-08-28 (×3): 250 mg via ORAL
  Filled 2012-08-26 (×3): qty 0.5

## 2012-08-26 MED ORDER — SODIUM CHLORIDE 0.9 % IV SOLN
375.0000 mg/m2 | Freq: Once | INTRAVENOUS | Status: AC
  Administered 2012-08-26: 700 mg via INTRAVENOUS
  Filled 2012-08-26: qty 70

## 2012-08-26 MED ORDER — SODIUM CHLORIDE 0.9 % IV SOLN
Freq: Once | INTRAVENOUS | Status: AC | PRN
  Administered 2012-08-26: 1000 mL via INTRAVENOUS

## 2012-08-26 NOTE — Progress Notes (Signed)
[facility] PROGRESS NOTE  Cross Jorge ZOX:[id number] DOB: 1945/10/30 DOA: 08/20/2012 PCP: [name], MD  Brief narrative Pt is 67 yo male who presented to [facility] ED with main concern of progressively worsening generalized weakness, that he initially noted several months prior to this admission, associated with lower back pain, constant and sharp, 10/10 in severity with no specific alleviating factors or aggravating factors. He was started on prednisone by his PCP. Positive for 40 pounds weight loss and decreased appetite over the last month. He stated he has had US of the liver and kidney done the day prior to admission and was told no acute findings noted. He had prior history of non -Hodgkin lymphoma in 1995 treated at [facility] but has been stable since. Pt also complained of left lower extremity weakness to a point where he has to lift his leg up to move it. In ED, pt had MRI done of the lumbar and thoracic spine and findings worrisome for metastatic disease of unknown primary. TR[facility] asked to admit for further evaluation.   Assessment/Plan: 1. Generalized weakness with mostly left lower extremity weakness; This is likely secondary to the widely metastatic disease noted in vertebrae. No evidence for cord compression. MRi Brain is unremarkable. PT/OT. Pain control.  2. 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 Gustavo Lah input. CT guided retroperitoneal lymph node Bx and Bone marrow bx -results suggestive of non-Hodgkin's lymphoma per oncology note. Continue steroids for now-oncology to advise regarding adjustment of dose. Dr. [name] recommend transferring to oncology floor for chemotherapy today (cleared by cardiology to transfer to non-telemetry bed).  3. Sinus Tachycardia/Cardiomyopathy: ECHO reveals cardiomyopathy with hypokinesis. He has risk factors for CAD. He will be getting chemotherapy for lymphoma and some of those agents could affect  cardiac function. Continue beta blocker for now. Patient denies chest pain or shortness of breath. TSh was normal. Stopped IVF. Cardiology input appreciated. Discussed with cardiology team who have cleared for transfer to non-telemetry bed. MUGA scan on 7/8: Mildly decreased left ventricle EF of 43% without focal wall motion abnormalities. 4. Hyponatremia/Hypokalemia: ?dehydration vs related to cancer. Improvement with trial of IVF. Can check Urine osm though results may be unreliable as he has received fluids. Replete K. followup BMP in a.m. 5. Thyroid Nodule: TSH and FT4 are normal. Will need follow up for same as OP.  6. Transaminitis: Possibly due to Liver metastatic disease. Monitor.  7. Diabetes Mellitus 2: Made worse by steroids. A1c is 8.8. Continue SSI and Lantus. Added AM dose of Lantus. Will add mealtime NovoLog. Monitor  8. Severe protein calorie malnutrition. 9. Anemia of chronic disease: Anemia panel reviewed. Hgb stable. Transfuse as needed. No active bleeding noted.  10. History of HTN: Monitor BP. Continue Losartan. Replete K. Add BB.   Code Status: Full Family Communication: Discussed with spouse at bedside Disposition Plan: Transfer to oncology floor. Possible discharge on Friday/Saturday per oncology.   Consultants:  Medical oncology  Cardiology  Procedures:  CT guided biopsy of retroperitoneal lymph node 7/7  CT-guided bone marrow biopsy 7/8  Port-A-Cath placement  Antibiotics:  None   HPI/Subjective: Mild leg pain but denies any other complaints. No chest pain or dyspnea.  Objective: Filed Vitals:   08/25/12 1430 08/25/12 2057 08/26/12 0445 08/26/12 1436  BP: 108/62 122/73 118/69 135/81  Pulse: 108 108 96 99  Temp: 97.4 F (36.3 C) 97.5 F (36.4 C) 98.5 F (36.9 C) 97.6 F (36.4 C)  TempSrc: Oral Oral Axillary Oral  Resp:  20 18 18 18   Height:      Weight:      SpO2: 97% 96% 98% 97%    Intake/Output Summary (Last 24 hours) at 08/26/12  1438 Last data filed at 08/26/12 0817  Gross per 24 hour  Intake 697.67 ml  Output      0 ml  Net 697.67 ml   Filed Weights   08/20/12 1908 08/21/12 0244  Weight: 75.297 kg (166 lb) 76.6 kg (168 lb 14 oz)    Exam:   General exam: Comfortable.  Respiratory system: Clear. No increased work of breathing.  Cardiovascular system: S1 & S2 heard, RRR. No JVD, murmurs, gallops, clicks or pedal edema. Telemetry: Sinus rhythm.  Gastrointestinal system: Abdomen is nondistended, soft and nontender. Normal bowel sounds heard.  Central nervous system: Alert and oriented. No focal neurological deficits.  Extremities: Symmetric 4 x 5 power.   Data Reviewed: Basic Metabolic Panel:  Recent Labs Lab 08/21/12 0440 08/22/12 0513 08/23/12 0525 08/24/12 0505 08/25/12 0520  NA 133* 132* 133* 129* 134*  K 3.3* 3.7 4.2 3.7 3.3*  CL 100 97 97 94* 99  CO2 22 23 21 23 25   GLUCOSE 140* 198* 203* 266* 218*  BUN 19 19 15 15 20   CREATININE 0.93 0.78 0.74 0.76 0.83  CALCIUM 9.3 9.3 9.2 8.8 9.3   Liver Function Tests:  Recent Labs Lab 08/20/12 2000 08/22/12 0513 08/24/12 0505  AST 155* 61* 49*  ALT 139* 84* 49  ALKPHOS 378* 316* 491*  BILITOT 0.4 0.3 0.3  PROT 6.8 6.3 5.7*  ALBUMIN 2.1* 2.0* 1.8*   No results found for this basename: LIPASE, AMYLASE,  in the last 168 hours No results found for this basename: AMMONIA,  in the last 168 hours CBC:  Recent Labs Lab 08/20/12 2000 08/21/12 0440 08/22/12 0513 08/23/12 0525 08/24/12 0505 08/25/12 0520  WBC 6.8 5.0 6.8 8.5 8.0 5.3  NEUTROABS 5.5  --   --   --   --   --   HGB 9.9* 8.5* 9.5* 9.7* 9.0* 8.9*  HCT 30.2* 25.8* 28.2* 29.3* 27.3* 27.7*  MCV 83.4 83.0 81.3 82.1 81.3 82.9  PLT 283 233 241 281 232 188   Cardiac Enzymes:  Recent Labs Lab 08/20/12 2000  CKTOTAL 27   BNP (last 3 results) No results found for this basename: PROBNP,  in the last 8760 hours CBG:  Recent Labs Lab 08/25/12 1134 08/25/12 1637  08/25/12 2120 08/26/12 0743 08/26/12 1134  GLUCAP 208* 262* 305* 247* 348*    No results found for this or any previous visit (from the past 240 hour(s)).   Studies: Nm Cardiac Muga Rest  08/25/2012   *RADIOLOGY REPORT*  Clinical Data: Non-Hodgkins lymphoma, pre toxic chemotherapy  NUCLEAR MEDICINE CARDIAC MULTIPLE UPTAKE GATED ACQUISITION SCAN (CARDIAC MUGA)  Technique: Radiolabeled red blood cells used to perform resting radionuclide ventriculography. Imaging performed in the anterior, LAO, and lateral projections.  Resting left ventricular ejection fraction estimated after drawing region of interest curves around the left ventricle during systole and diastole.  Radiopharmaceutical: 25 mCi Tc-24m pertechnetate labeled autologous red cells  Comparison: None  Findings: Left ventricular ejection fraction is calculated at 43%, below the normal range. Acquisition performed at a cardiac rate of 110 beats per minute. Wall motion analysis in three projections shows no focal wall motion abnormalities.  IMPRESSION: Mildly decreased left ventricular ejection fraction of 43% without focal wall motion abnormalities.   Original Report Authenticated By: Ulyses Southward, M.D.  Ir Fluoro Guide Cv Line Right  08/25/2012   *RADIOLOGY REPORT*  Clinical Data: History of lymphoma.  Needs Port-A-Cath for chemotherapy.  FLUOROSCOPIC AND ULTRASOUND GUIDED PLACEMENT OF A SUBCUTANEOUS PORT.  Physician: Rachelle Hora. Lowella Dandy, MD  Medications:The patient was sedated for the bone marrow biopsy that immediately proceeded this procedure. A radiology nurse monitored the patient for moderate sedation.  Ancef 2 gm.   As antibiotic prophylaxis, Ancef  was ordered pre- procedure and administered intravenously within one hour of incision.  Moderate sedation time:25 minutes  Fluoroscopy time: 24 seconds  Procedure:  The risks of the procedure were explained to the patient.  Informed consent was obtained.  Patient was placed supine on the interventional  table.  Ultrasound confirmed a patent right internal jugular vein.  The right chest and neck were cleaned with a skin antiseptic and a sterile drape was placed.  Maximal barrier sterile technique was utilized including caps, mask, sterile gowns, sterile gloves, sterile drape, hand hygiene and skin antiseptic. The right neck was anesthetized with 1% lidocaine.  Small incision was made in the right neck with a blade.  Micropuncture set was placed in the right IJ with ultrasound guidance.  The micropuncture wire was used for measurement purposes.  The right chest was anesthetized with 1% lidocaine with epinephrine.  #15 blade was used to make an incision and a subcutaneous port pocket was formed. 8 french Power Port was assembled.  Subcutaneous tunnel was formed with a stiff tunneling device.  The port catheter was brought through the subcutaneous tunnel.  The port was placed in the subcutaneous pocket.  The micropuncture set was exchanged for a peel-away sheath.  The catheter was placed through the peel-away sheath and the tip was positioned in the SVC.  Catheter placement was confirmed with fluoroscopy.  The port was accessed and flushed with heparinized saline.  The port pocket was closed using two layers of absorbable sutures and Dermabond.  The vein skin site was closed using a single layer of absorbable suture and Dermabond. Sterile dressings were applied.  Patient tolerated the procedure well without an immediate complication.  Ultrasound and fluoroscopic images were taken and saved for this procedure.  Complications: None  Impression:  Placement of a subcutaneous port device.  The catheter tip is in the SVC and ready to be used.   Original Report Authenticated By: Richarda Overlie, M.D.   Ir US Guide Vasc Access Right  08/25/2012   *RADIOLOGY REPORT*  Clinical Data: History of lymphoma.  Needs Port-A-Cath for chemotherapy.  FLUOROSCOPIC AND ULTRASOUND GUIDED PLACEMENT OF A SUBCUTANEOUS PORT.  Physician: Rachelle Hora.  Lowella Dandy, MD  Medications:The patient was sedated for the bone marrow biopsy that immediately proceeded this procedure. A radiology nurse monitored the patient for moderate sedation.  Ancef 2 gm.   As antibiotic prophylaxis, Ancef  was ordered pre- procedure and administered intravenously within one hour of incision.  Moderate sedation time:25 minutes  Fluoroscopy time: 24 seconds  Procedure:  The risks of the procedure were explained to the patient.  Informed consent was obtained.  Patient was placed supine on the interventional table.  Ultrasound confirmed a patent right internal jugular vein.  The right chest and neck were cleaned with a skin antiseptic and a sterile drape was placed.  Maximal barrier sterile technique was utilized including caps, mask, sterile gowns, sterile gloves, sterile drape, hand hygiene and skin antiseptic. The right neck was anesthetized with 1% lidocaine.  Small incision was made in the right neck  with a blade.  Micropuncture set was placed in the right IJ with ultrasound guidance.  The micropuncture wire was used for measurement purposes.  The right chest was anesthetized with 1% lidocaine with epinephrine.  #15 blade was used to make an incision and a subcutaneous port pocket was formed. 8 french Power Port was assembled.  Subcutaneous tunnel was formed with a stiff tunneling device.  The port catheter was brought through the subcutaneous tunnel.  The port was placed in the subcutaneous pocket.  The micropuncture set was exchanged for a peel-away sheath.  The catheter was placed through the peel-away sheath and the tip was positioned in the SVC.  Catheter placement was confirmed with fluoroscopy.  The port was accessed and flushed with heparinized saline.  The port pocket was closed using two layers of absorbable sutures and Dermabond.  The vein skin site was closed using a single layer of absorbable suture and Dermabond. Sterile dressings were applied.  Patient tolerated the procedure  well without an immediate complication.  Ultrasound and fluoroscopic images were taken and saved for this procedure.  Complications: None  Impression:  Placement of a subcutaneous port device.  The catheter tip is in the SVC and ready to be used.   Original Report Authenticated By: Richarda Overlie, M.D.   Ct Biopsy  08/25/2012   *RADIOLOGY REPORT*  Clinical history: 67 year old with anemia and history of lymphoma.   PROCEDURE(S): CT GUIDED BONE MARROW ASPIRATES AND BIOPSY  Physician: Rachelle Hora. Henn, MD   Medications: Versed 2 mg, Fentanyl 100 mcg.  A radiology nurse monitored the patient for moderate sedation.  Sedation time:  25 minutes   Procedure: The procedure was explained to the patient. The risks and benefits of the procedure were discussed and the patient's questions were addressed.  Informed consent was obtained from the patient. The patient was placed prone on CT scan. Images of the pelvis were obtained. The right side of back was prepped and draped in sterile fashion. The skin and right posterior iliac bone were anesthetized with 1% lidocaine.   11 gauge bone needle was directed into the right iliac bone with CT guidance.  It was very difficult to obtain aspirate material.  A total of four aspirates were obtained.  Two core biopsies were performed and one adequate core specimen was obtained.  Findings:Biopsy needle directed into the posterior right iliac bone.  Complications: None   Impression: CT guided bone marrow aspirates and core biopsy.   Original Report Authenticated By: Richarda Overlie, M.D.     Additional labs:   Scheduled Meds: . sodium chloride   Intravenous Once  . acetaminophen  650 mg Oral Once  . allopurinol  300 mg Oral Daily  . cyclophosphamide  750 mg/m2 (Treatment Plan Actual) Intravenous Once  . [START ON 08/27/2012] dexamethasone  12 mg Intravenous Q12H  . dexrazoxane  500 mg/m2 Intravenous Once  . diphenhydrAMINE  50 mg Oral Once  . DOXOrubicin  50 mg/m2 (Treatment Plan Actual)  Intravenous Once  . enoxaparin (LOVENOX) injection  40 mg Subcutaneous Q24H  . famciclovir  250 mg Oral Daily  . feeding supplement  237 mL Oral TID BM  . insulin aspart  0-15 Units Subcutaneous TID WC  . insulin aspart  0-5 Units Subcutaneous QHS  . insulin aspart  4 Units Subcutaneous TID WC  . insulin glargine  10 Units Subcutaneous Daily  . insulin glargine  20 Units Subcutaneous QHS  . losartan  50 mg Oral Daily  .  metoprolol tartrate  25 mg Oral BID  . ondansetron (ZOFRAN) with dexamethasone (DECADRON) IV   Intravenous Once  . predniSONE  80 mg Oral Q breakfast  . riTUXimab (RITUXAN) IV infusion  375 mg/m2 (Treatment Plan Actual) Intravenous Once  . sodium chloride  3 mL Intravenous Q12H  . sulfamethoxazole-trimethoprim  1 tablet Oral 3 times weekly  . vinCRIStine (ONCOVIN) CHEMO IV infusion  2 mg Intravenous Once   Continuous Infusions:   Principal Problem:   Generalized weakness Active Problems:   Anemia of chronic disease   Transaminitis   Metastatic cancer   Protein-calorie malnutrition, severe   DM (diabetes mellitus), type 2, uncontrolled   Dilated cardiomyopathy   Sinus tachycardia   NHL (non-Hodgkin's lymphoma)    Time spent: 40 minutes    [facility]  [facility] Pager [phone].   If 8PM-8AM, please contact night-coverage at [email or web address], password [facility] 08/26/2012, 2:38 PM  LOS: 6 days

## 2012-08-26 NOTE — Progress Notes (Signed)
I was called by pathology yesterday. It looks like he does have non-Hodgkin's lymphoma. The pathologist feels this is high-grade. This is not surprising given his LDH being so high.  Yet his bone marrow biopsy done yesterday. He had his Port-A-Cath placed yesterday.  I think we have enough information now that we can start him on chemotherapy. His MUGA scan did show an ejection fraction of 43%. Cardiology has seen him.  I think we can go ahead with chemotherapy using R_CHOP.  I may consider using Zinecard as a cardioprotective.  With him being an inpatient, I don't think we will be able to get a PET scan on him.  His blood sugars are high. He is on Decadron. He's feeling much better with the Decadron. We'll see about cutting the dose back.  He will need to be transferred down to the third floor for chemotherapy.  His blood sugars will need to be controlled. Again there quite high with the Decadron. Before he could be considered for discharge, he will need to be on a good regimen for his blood sugars.  On physical exam, temperature 98.5 pulse 96 blood pressure 118/69. No obvious adenopathy is appreciated on exam. Lungs are clear. Cardiac exam regular rate and rhythm. Abdomen is soft. There is no abdominal mass. Extremities shows no clubbing cyanosis or edema.  Again, every test is is a highly suggestive of non-Hodgkin's lymphoma. As such, I think we need to start therapy on him as an inpatient.  We will continue to follow him closely. Hopefully, he will be able to be discharged Friday or Saturday.  I very much appreciate the excellent care that he has been receiving on [address].  Barron E.  Psalm 55:22

## 2012-08-26 NOTE — Evaluation (Signed)
Occupational Therapy Evaluation Patient Details Name: Keith Duffy MRN: [id number] DOB: [birth year] Today's Date: 08/26/2012 Time: 1191-4782 OT Time Calculation (min): 20 min  OT Assessment / Plan / Recommendation History of present illness pt has had  a several month h/o weight loss/weakness.  He has a h/o lymphoma and now NHL.  He is beginning chemo.  Pt was getting weak and got to the point where he couldn't stand up.  He is regaining strength and can stand with min guard   Clinical Impression   Pt was admitted with LE weakness and diagnosed with NHL.  He will benefit from skilled OT to increase strength/endurance for adls and continue to educate on energy conservation.  Do not anticipate he will need post acute OT services.      OT Assessment  Patient needs continued OT Services    Follow Up Recommendations  No OT follow up;Supervision/Assistance - 24 hour    Barriers to Discharge      Equipment Recommendations   (pt thinking about shower seat/3:1 for shower)    Recommendations for Other Services    Frequency  Min 2X/week    Precautions / Restrictions Precautions Precautions: Fall Restrictions Weight Bearing Restrictions: No   Pertinent Vitals/Pain No c/o pain    ADL  Grooming: Set up Where Assessed - Grooming: Unsupported sitting Upper Body Bathing: Set up Where Assessed - Upper Body Bathing: Unsupported sitting Lower Body Bathing: Min guard Where Assessed - Lower Body Bathing: Supported sit to stand Upper Body Dressing: Minimal assistance (iv) Where Assessed - Upper Body Dressing: Unsupported sitting Lower Body Dressing: Min guard Where Assessed - Lower Body Dressing: Supported sit to stand Toileting - Architect and Hygiene: Min guard Where Assessed - Engineer, mining and Hygiene: Sit to stand from 3-in-1 or toilet Equipment Used:  (pt used bed rail for support when standing) Transfers/Ambulation Related to ADLs: sat at eob with  supervision, extra time.  Pt takes a minute to acclimate.  Stood and sidestepped with min guard ADL Comments: Pt has been crossing legs and breaking up shower and dressing.  Reviewed energy conservation and gave hand out.  Wife is with him 24/7.      OT Diagnosis: Generalized weakness  OT Problem List: Decreased strength;Decreased activity tolerance;Impaired balance (sitting and/or standing);Decreased knowledge of use of DME or AE OT Treatment Interventions: Self-care/ADL training;DME and/or AE instruction;Energy conservation;Patient/family education;Balance training   OT Goals(Current goals can be found in the care plan section) Acute Rehab OT Goals Patient Stated Goal: get back to being outdoors:  riding tractor OT Goal Formulation: With patient/family Time For Goal Achievement: 09/09/12 Potential to Achieve Goals: Good ADL Goals Pt Will Perform Grooming: with supervision;sitting;standing Pt Will Transfer to Toilet: with supervision;ambulating;regular height toilet Pt Will Perform Tub/Shower Transfer: with supervision;ambulating;shower seat  Visit Information  Last OT Received On: 08/26/12 Assistance Needed: +1 History of Present Illness: pt has had  a several month h/o weight loss.  He has a h/o lymphoma and now NHL.  He is beginning chemo.  Pt was getting weak and got to the point where he couldn't stand up.  He is regaining strength and can stand with min guard       Prior Functioning     Home Living Family/patient expects to be discharged to:: Private residence Living Arrangements: Spouse/significant other Available Help at Discharge: Family;Available 24 hours/day Type of Home: House Home Access: Stairs to enter [employer] of Steps: 1 + 1 Additional Comments: sink next to commode  Prior Function Level of Independence: Independent (until just before hospitalization.  Broke up activities) Communication Communication: No difficulties          Vision/Perception     Cognition  Cognition Arousal/Alertness: Awake/alert Behavior During Therapy: WFL for tasks assessed/performed Overall Cognitive Status: Within Functional Limits for tasks assessed    Extremity/Trunk Assessment Upper Extremity Assessment Upper Extremity Assessment: Overall WFL for tasks assessed (grossly 4/5 strength)     Mobility Bed Mobility Bed Mobility: Supine to Sit;Sit to Supine Supine to Sit: 5: Supervision;HOB elevated Sit to Supine: 5: Supervision;HOB flat Transfers Transfers: Sit to Stand Sit to Stand: 4: Min guard;From elevated surface;From bed;With upper extremity assist Details for Transfer Assistance: steadying assistance     Exercise     Balance     End of Session OT - End of Session Activity Tolerance: Patient tolerated treatment well Patient left: in bed;with call bell/phone within reach;with family/visitor present  GO     Delara Shepheard 08/26/2012, 4:20 PM Marica Otter, OTR/L [phone] 08/26/2012

## 2012-08-26 NOTE — Progress Notes (Addendum)
Inpatient Diabetes Program Recommendations  AACE/ADA: New Consensus Statement on Inpatient Glycemic Control (2013)  Target Ranges:  Prepandial:   less than 140 mg/dL      Peak postprandial:   less than 180 mg/dL (1-2 hours)      Critically ill patients:  140 - 180 mg/dL   Reason for Visit: Spoke to patient at length regarding diabetes and glucose control.  He was placed on Lantus last week by PCP.  Discussed likely need for Novolog with meals at discharge.  He is already familiar with use of insulin pen except that wife was throwing pens away after 5 uses.  Clarified that there are 300 units in each pen so should last 15 days at 20 units/day.  Patient and wife are currently frustrated with PCP MD due to them not feeling like they were taken seriously regarding his symptoms.  They are not sure if they want to follow-up with him regarding diabetes?  Will need close follow-up with MD regarding insulin/blood glucose control.  Will follow-up on 08/27/12.  Consider increasing Novolog correction to q 4 hours while on steroids.

## 2012-08-26 NOTE — Progress Notes (Signed)
Patient transferred from [facility] [address], came at around 1450, alert and oriented x4.Placedcomfortably in bed,- Hulda Marin RN

## 2012-08-27 LAB — BASIC METABOLIC PANEL
BUN: 30 mg/dL — ABNORMAL HIGH (ref 6–23)
Calcium: 8.4 mg/dL (ref 8.4–10.5)
GFR calc non Af Amer: >90 mL/min (ref >90)
Glucose, Bld: 157 mg/dL — ABNORMAL HIGH (ref 70–99)

## 2012-08-27 LAB — CBC
Hemoglobin: 8 g/dL — ABNORMAL LOW (ref 13.0–17.0)
MCH: 26.8 pg (ref 26.0–34.0)
MCHC: 32.5 g/dL (ref 30.0–36.0)
MCV: 82.3 fL (ref 78.0–100.0)

## 2012-08-27 LAB — GLUCOSE, CAPILLARY
Glucose-Capillary: 156 mg/dL — ABNORMAL HIGH (ref 70–99)
Glucose-Capillary: 297 mg/dL — ABNORMAL HIGH (ref 70–99)
Glucose-Capillary: 189 mg/dL — ABNORMAL HIGH (ref 70–99)

## 2012-08-27 LAB — PREPARE RBC (CROSSMATCH)

## 2012-08-27 MED ORDER — FUROSEMIDE 10 MG/ML IJ SOLN
10.0000 mg | Freq: Once | INTRAMUSCULAR | Status: AC
  Administered 2012-08-27: 10 mg via INTRAVENOUS
  Filled 2012-08-27: qty 1

## 2012-08-27 MED ORDER — ACETAMINOPHEN 325 MG PO TABS
650.0000 mg | ORAL_TABLET | Freq: Once | ORAL | Status: AC
  Administered 2012-08-27: 650 mg via ORAL
  Filled 2012-08-27: qty 2

## 2012-08-27 MED ORDER — INSULIN ASPART 100 UNIT/ML ~~LOC~~ SOLN
8.0000 [IU] | Freq: Three times a day (TID) | SUBCUTANEOUS | Status: DC
  Administered 2012-08-28: 8 [IU] via SUBCUTANEOUS

## 2012-08-27 MED ORDER — GLUCERNA SHAKE PO LIQD
237.0000 mL | Freq: Three times a day (TID) | ORAL | Status: DC
  Administered 2012-08-27: 237 mL via ORAL
  Filled 2012-08-27 (×4): qty 237

## 2012-08-27 MED ORDER — FILGRASTIM 480 MCG/1.6ML IJ SOLN
480.0000 ug | Freq: Every day | INTRAMUSCULAR | Status: DC
  Administered 2012-08-27 – 2012-08-28 (×2): 480 ug via SUBCUTANEOUS
  Filled 2012-08-27 (×2): qty 1.6

## 2012-08-27 MED ORDER — DIPHENHYDRAMINE HCL 25 MG PO CAPS
25.0000 mg | ORAL_CAPSULE | Freq: Once | ORAL | Status: AC
  Administered 2012-08-27: 25 mg via ORAL
  Filled 2012-08-27: qty 1

## 2012-08-27 MED ORDER — LIVING WELL WITH DIABETES BOOK
Freq: Once | Status: DC
  Filled 2012-08-27: qty 1

## 2012-08-27 NOTE — Progress Notes (Signed)
Keith Duffy tolerated his first cycle of chemotherapy well. He had no complications with this.  He feels a little better. His appetite is slowly coming back.  His LDH is coming down nicely. Is now down to 2200.  His hemoglobin is 8.0. I really believe that he is going to need be transfused. We will give him 2 units of blood.  Am still awaiting the final report was bone marrow biopsy.  He's had no fever. He's had a few sweats.  His blood sugars have a high side. These need to be controlled before he'll be discharged.  There is no problems with pain. He's had no diarrhea.  His potassium is 3.8. Renal function is okay.  He will need Neupogen while in the hospital. We will order this.  I suspect that he should be ready to be discharged either tomorrow or Saturday. Again, they need to be instructed about blood sugar monitoring.  I still feel confident that we will be able to help him and be able to improve his quality of life.  His vital signs are all stable. Blood pressure is 128/82. Lungs are clear. Cardiac exam regular rate and rhythm with no murmurs rubs or bruits. Abdomen is soft. He has good bowel sounds. There is no palpable hepatospleno megaly. Extremities shows no clubbing cyanosis or edema.  Again, we will go ahead and give him 2 units of blood. I think this will help him out particularly when he is discharged.  We will start him on some Neupogen.   Keith E.  Isaiah 55:8

## 2012-08-27 NOTE — Progress Notes (Signed)
PATIENT TOLERATED RITUXAN WITHOUT  PROBLEMS, Keith Spohr, RN

## 2012-08-27 NOTE — Progress Notes (Signed)
[facility] PROGRESS NOTE  Brylen Wagar ZHY:[id number] DOB: 06/01/1945 DOA: 08/20/2012 PCP: [name], MD  Brief narrative Pt is 67 yo male who presented to [facility] ED with main concern of progressively worsening generalized weakness, that he initially noted several months prior to this admission, associated with lower back pain, constant and sharp, 10/10 in severity with no specific alleviating factors or aggravating factors. He was started on prednisone by his PCP. Positive for 40 pounds weight loss and decreased appetite over the last month. He stated he has had US of the liver and kidney done the day prior to admission and was told no acute findings noted. He had prior history of non -Hodgkin lymphoma in 1995 treated at [facility] but has been stable since. Pt also complained of left lower extremity weakness to a point where he has to lift his leg up to move it. In ED, pt had MRI done of the lumbar and thoracic spine and findings worrisome for metastatic disease of unknown primary. [facility] asked to admit for further evaluation.   Assessment/Plan: 1. Generalized weakness with mostly left lower extremity weakness; This is likely secondary to the widely metastatic disease noted in vertebrae. No evidence for cord compression. MRi Brain is unremarkable. PT/OT. Pain controlled. Patient feels stronger. 2. 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 Gustavo Lah input. CT guided retroperitoneal lymph node Bx and Bone marrow bx -results suggestive of non-Hodgkin's lymphoma per oncology note. Patient started chemotherapy on 08/26/12 and tolerated same.  3. Sinus Tachycardia/Cardiomyopathy: ECHO reveals cardiomyopathy with hypokinesis. He has risk factors for CAD. He will be getting chemotherapy for lymphoma and some of those agents could affect cardiac function. Continue beta blocker for now. Patient denies chest pain or shortness of breath. TSh was normal. Stopped IVF.  Cardiology input appreciated. MUGA scan on 7/8: Mildly decreased left ventricle EF of 43% without focal wall motion abnormalities. Discuss with cardiology regarding outpatient stress test prior to DC 4. Hyponatremia/Hypokalemia: ?dehydration vs related to cancer. Improvement with trial of IVF. Can check Urine osm though results may be unreliable as he has received fluids. Resolved. 5. Thyroid Nodule: TSH and FT4 are normal. Will need follow up for same as OP.  6. Transaminitis: Possibly due to Liver metastatic disease. Monitor.  7. Diabetes Mellitus 2: Made worse by steroids. A1c is 8.8. Continue SSI and Lantus. Added AM dose of Lantus. Will add mealtime NovoLog. Insulins will be adjusted and monitor closely. Patient is on day 2/5 of steroids for chemotherapy. Once steroid stops, insulin requirement will decrease and insulins will need to be adjusted likewise. 8. Severe protein calorie malnutrition. 9. Anemia of chronic disease: Anemia panel reviewed. Hemoglobin 8 g per DL. Oncology transfusing tissue 2 units of PRBCs. Follow CBC in a.m. 10. History of HTN: Monitor BP. Continue Losartan. Add BB.   Code Status: Full Family Communication: Discussed with spouse at bedside Disposition Plan:  Possible discharge on Friday/Saturday per oncology.   Consultants:  Medical oncology  Cardiology  Procedures:  CT guided biopsy of retroperitoneal lymph node 7/7  CT-guided bone marrow biopsy 7/8  Port-A-Cath placement  Antibiotics:  None   HPI/Subjective: Feels tired. Denies any other complaints.  Objective: Filed Vitals:   08/27/12 1247 08/27/12 1311 08/27/12 1411 08/27/12 1511  BP: 125/71 132/74 137/78 148/86  Pulse: 91 90 96 92  Temp: 97.6 F (36.4 C) 97.7 F (36.5 C) 97.6 F (36.4 C) 97.7 F (36.5 C)  TempSrc: Oral Oral Oral Oral  Resp: 18 18  18 18  Height:      Weight:      SpO2:        Intake/Output Summary (Last 24 hours) at 08/27/12 1531 Last data filed at 08/27/12  1512  Gross per 24 hour  Intake    830 ml  Output   1025 ml  Net   -195 ml   Filed Weights   08/20/12 1908 08/21/12 0244 08/26/12 1509  Weight: 75.297 kg (166 lb) 76.6 kg (168 lb 14 oz) 78.155 kg (172 lb 4.8 oz)    Exam:   General exam: Comfortable.  Respiratory system: Clear. No increased work of breathing.  Cardiovascular system: S1 & S2 heard, RRR. No JVD, murmurs, gallops, clicks or pedal edema.   Gastrointestinal system: Abdomen is nondistended, soft and nontender. Normal bowel sounds heard.  Central nervous system: Alert and oriented. No focal neurological deficits.  Extremities: Symmetric 4 x 5 power.   Data Reviewed: Basic Metabolic Panel:  Recent Labs Lab 08/22/12 0513 08/23/12 0525 08/24/12 0505 08/25/12 0520 08/27/12 0540  NA 132* 133* 129* 134* 139  K 3.7 4.2 3.7 3.3* 3.8  CL 97 97 94* 99 104  CO2 23 21 23 25 24   GLUCOSE 198* 203* 266* 218* 157*  BUN 19 15 15 20  30*  CREATININE 0.78 0.74 0.76 0.83 0.82  CALCIUM 9.3 9.2 8.8 9.3 8.4   Liver Function Tests:  Recent Labs Lab 08/20/12 2000 08/22/12 0513 08/24/12 0505  AST 155* 61* 49*  ALT 139* 84* 49  ALKPHOS 378* 316* 491*  BILITOT 0.4 0.3 0.3  PROT 6.8 6.3 5.7*  ALBUMIN 2.1* 2.0* 1.8*   No results found for this basename: LIPASE, AMYLASE,  in the last 168 hours No results found for this basename: AMMONIA,  in the last 168 hours CBC:  Recent Labs Lab 08/20/12 2000  08/22/12 0513 08/23/12 0525 08/24/12 0505 08/25/12 0520 08/27/12 0540  WBC 6.8  < > 6.8 8.5 8.0 5.3 7.8  NEUTROABS 5.5  --   --   --   --   --   --   HGB 9.9*  < > 9.5* 9.7* 9.0* 8.9* 8.0*  HCT 30.2*  < > 28.2* 29.3* 27.3* 27.7* 24.6*  MCV 83.4  < > 81.3 82.1 81.3 82.9 82.3  PLT 283  < > 241 281 232 188 202  < > = values in this interval not displayed. Cardiac Enzymes:  Recent Labs Lab 08/20/12 2000  CKTOTAL 27   BNP (last 3 results) No results found for this basename: PROBNP,  in the last 8760  hours CBG:  Recent Labs Lab 08/26/12 1134 08/26/12 1653 08/26/12 2154 08/27/12 0748 08/27/12 1137  GLUCAP 348* 379* 223* 156* 297*    No results found for this or any previous visit (from the past 240 hour(s)).   Studies: No results found.   Additional labs:   Scheduled Meds: . allopurinol  300 mg Oral Daily  . enoxaparin (LOVENOX) injection  40 mg Subcutaneous Q24H  . famciclovir  250 mg Oral Daily  . filgrastim  480 mcg Subcutaneous Daily  . furosemide  10 mg Intravenous Once  . insulin aspart  0-15 Units Subcutaneous TID WC  . insulin aspart  0-5 Units Subcutaneous QHS  . insulin aspart  8 Units Subcutaneous TID WC  . insulin glargine  10 Units Subcutaneous Daily  . insulin glargine  20 Units Subcutaneous QHS  . losartan  50 mg Oral Daily  . metoprolol tartrate  25 mg  Oral BID  . predniSONE  80 mg Oral Q breakfast  . sodium chloride  3 mL Intravenous Q12H  . sulfamethoxazole-trimethoprim  1 tablet Oral 3 times weekly   Continuous Infusions:   Principal Problem:   Generalized weakness Active Problems:   Anemia of chronic disease   Transaminitis   Metastatic cancer   Protein-calorie malnutrition, severe   DM (diabetes mellitus), type 2, uncontrolled   Dilated cardiomyopathy   Sinus tachycardia   NHL (non-Hodgkin's lymphoma)    Time spent: 20 minutes    [facility]  [facility] Pager [phone].   If 8PM-8AM, please contact night-coverage at [email or web address], password [facility] 08/27/2012, 3:31 PM  LOS: 7 days

## 2012-08-27 NOTE — Progress Notes (Signed)
Inpatient Diabetes Program Recommendations  AACE/ADA: New Consensus Statement on Inpatient Glycemic Control (2013)  Target Ranges:  Prepandial:   less than 140 mg/dL      Peak postprandial:   less than 180 mg/dL (1-2 hours)      Critically ill patients:  140 - 180 mg/dL   Reason for Visit: CBG's continue to be elevated with steroids.  Patient states that A1C was in the 6% range 6 months ago only on metformin.  It is likely that A1C is elevated from the steroids and acute hyperglycemia.  Patient may not need insulin once steroids stopped so it is very important that they have clear guidelines on insulin and how to taper when steroids stopped.  Wife and patient discussed that when his CBG's are around 120 mg/dL, he feels hypoglycemic.  Discussed 15/15 rule of treatment of hypoglycemia.  Also reviewed action of Lantus and Novolog regarding basal/bolus insulin. Discussed with Dr. Waymon Amato.  Orders received to increase Novolog meal coverage to 8 units tid with meals while on high dose steroids.  Will have Diabetes Coordinator see patient on 08/28/12 to make sure that they understand D/C insulin regimen.

## 2012-08-27 NOTE — Progress Notes (Signed)
NUTRITION FOLLOW UP  Intervention:   Change nutrition supplement to Glucerna TID Encouraged PO intake with protein-rich foods Gave pt "Taste and Smell Changes" , "Tips for adding Protein", and "High Protein Food List" handouts from the Academy of [employer] and Dietetics   Nutrition Dx:   Inadequate oral intake related to decreased appetite and early satiety as evidenced by patient report; ongoing  Goal:   Pt to meet >/= 90% of their estimated nutrition needs; not met   Monitor:   PO intake Weight Labs  Assessment:   Per MD note pt tolerated his first cycle of chemotherapy and "His blood sugars have a high side. These need to be controlled before he'll be discharged." Per previous RD note, "Patient with a 20%weight loss in the last 6 weeks". Per nursing notes pt was eating 75-100% of most meals for a couple days but for the past 2 days he has been eating 0-20% of meals. Pt reports sensitivity to smell of food at lunch and therefore didn't eat today. Pt states he has been drinking Ensure and likes it; encouraged pt to continue nutrition supplement intake and recommend Glucerna instead of Ensure when his blood sugars are high. Reviewed high protein foods and encouraged pt to incorporate them into his diet daily. Discussed tips for smell and taste changes and provided handouts.    Height: Ht Readings from Last 1 Encounters:  08/26/12 5\' 9"  (1.753 m)    Weight Status:   Wt Readings from Last 1 Encounters:  08/26/12 172 lb 4.8 oz (78.155 kg)    Re-estimated needs:  Kcal: 2200-2400  Protein: 90-100  Fluid: >2.2L  Skin: WDL  Diet Order: Carb Control   Intake/Output Summary (Last 24 hours) at 08/27/12 1532 Last data filed at 08/27/12 1512  Gross per 24 hour  Intake    830 ml  Output   1025 ml  Net   -195 ml    Last BM: 7/9   Labs:   Recent Labs Lab 08/24/12 0505 08/25/12 0520 08/27/12 0540  NA 129* 134* 139  K 3.7 3.3* 3.8  CL 94* 99 104  CO2 23 25 24   BUN  15 20 30*  CREATININE 0.76 0.83 0.82  CALCIUM 8.8 9.3 8.4  GLUCOSE 266* 218* 157*    CBG (last 3)   Recent Labs  08/26/12 2154 08/27/12 0748 08/27/12 1137  GLUCAP 223* 156* 297*    Scheduled Meds: . allopurinol  300 mg Oral Daily  . enoxaparin (LOVENOX) injection  40 mg Subcutaneous Q24H  . famciclovir  250 mg Oral Daily  . filgrastim  480 mcg Subcutaneous Daily  . furosemide  10 mg Intravenous Once  . insulin aspart  0-15 Units Subcutaneous TID WC  . insulin aspart  0-5 Units Subcutaneous QHS  . insulin aspart  8 Units Subcutaneous TID WC  . insulin glargine  10 Units Subcutaneous Daily  . insulin glargine  20 Units Subcutaneous QHS  . losartan  50 mg Oral Daily  . metoprolol tartrate  25 mg Oral BID  . predniSONE  80 mg Oral Q breakfast  . sodium chloride  3 mL Intravenous Q12H  . sulfamethoxazole-trimethoprim  1 tablet Oral 3 times weekly    Continuous Infusions:   Ian Malkin RD, LDN Inpatient Clinical Dietitian Pager: [phone] After Hours Pager: [phone]

## 2012-08-27 NOTE — Evaluation (Signed)
Physical Therapy Evaluation Patient Details Name: Keith Duffy MRN: [id number] DOB: [birth year] Today's Date: 08/27/2012 Time: 1030-1040 PT Time Calculation (min): 10 min  PT Assessment / Plan / Recommendation History of Present Illness  pt has had  a several month h/o weight loss.  He has a h/o lymphoma and now NHL and had first chemo treatment yesterday Pt and wife state he is getting up fine to go to the bathroom.  Pt had 3 weeks of PT prior to admission and feels like he will be OK on his own at d/c without further PT follow up.  Pt was interested in home ex program  Clinical Impression  Anticipate pt will be able to follow through on his own with wife assisting.      PT Assessment  Patent does not need any further PT services    Follow Up Recommendations       Does the patient have the potential to tolerate intense rehabilitation      Barriers to Discharge        Equipment Recommendations  None recommended by PT    Recommendations for Other Services     Frequency      Precautions / Restrictions     Pertinent Vitals/Pain None stated      Mobility       Exercises Other Exercises Other Exercises: Pt issued written exercise program of basic core and LE exercises.  All exercises reviewed and pt acknowledged them     PT Diagnosis:    PT Problem List:   PT Treatment Interventions:       PT Goals(Current goals can be found in the care plan section)    Visit Information  Last PT Received On: 08/27/12 History of Present Illness: pt has had  a several month h/o weight loss.  He has a h/o lymphoma and now NHL and had first chemo treatment yesterday Pt and wife state he is getting up fine to go to the bathroom.  Pt had 3 weeks of PT prior to admission and feels like he will be OK on his own at d/c without further PT follow up.  Pt was interested in home ex program       Prior Functioning  Home Living Family/patient expects to be discharged to:: Private  residence Living Arrangements: Spouse/significant other Available Help at Discharge: Family;Available 24 hours/day Type of Home: House Home Access: Stairs to enter [employer] of Steps: 1 + 1 Additional Comments: sink next to commode Prior Function Level of Independence: Independent (until just before hospitalization.  Broke up activities) Communication Communication: No difficulties    Cognition  Cognition Arousal/Alertness: Awake/alert Behavior During Therapy: WFL for tasks assessed/performed Overall Cognitive Status: Within Functional Limits for tasks assessed    Extremity/Trunk Assessment Lower Extremity Assessment Lower Extremity Assessment: Overall WFL for tasks assessed   Balance    End of Session PT - End of Session Activity Tolerance: Patient tolerated treatment well Patient left: in bed;with family/visitor present  GP     Donnetta Hail 08/27/2012, 11:04 AM

## 2012-08-28 ENCOUNTER — Other Ambulatory Visit: Payer: Self-pay | Admitting: Cardiology

## 2012-08-28 ENCOUNTER — Other Ambulatory Visit: Payer: Self-pay | Admitting: Hematology & Oncology

## 2012-08-28 ENCOUNTER — Telehealth (HOSPITAL_COMMUNITY): Payer: Self-pay | Admitting: Cardiovascular Disease

## 2012-08-28 DIAGNOSIS — C8590 Non-Hodgkin lymphoma, unspecified, unspecified site: Secondary | ICD-10-CM

## 2012-08-28 DIAGNOSIS — R000 Tachycardia, unspecified: Secondary | ICD-10-CM

## 2012-08-28 DIAGNOSIS — I429 Cardiomyopathy, unspecified: Secondary | ICD-10-CM

## 2012-08-28 LAB — TYPE AND SCREEN
Antibody Screen: NEGATIVE
Unit division: 0

## 2012-08-28 LAB — CBC
HCT: 29.4 % — ABNORMAL LOW (ref 39.0–52.0)
Hemoglobin: 9.7 g/dL — ABNORMAL LOW (ref 13.0–17.0)
MCV: 82.1 fL (ref 78.0–100.0)
RDW: 13.8 % (ref 11.5–15.5)
WBC: 12.4 10*3/uL — ABNORMAL HIGH (ref 4.0–10.5)

## 2012-08-28 LAB — COMPREHENSIVE METABOLIC PANEL
Alkaline Phosphatase: 327 U/L — ABNORMAL HIGH (ref 39–117)
BUN: 28 mg/dL — ABNORMAL HIGH (ref 6–23)
CO2: 25 mEq/L (ref 19–32)
Chloride: 101 mEq/L (ref 96–112)
Creatinine, Ser: 0.8 mg/dL (ref 0.50–1.35)
GFR calc Af Amer: >90 mL/min (ref >90)
GFR calc non Af Amer: >90 mL/min (ref >90)
Glucose, Bld: 99 mg/dL (ref 70–99)
Total Bilirubin: 0.3 mg/dL (ref 0.3–1.2)

## 2012-08-28 LAB — GLUCOSE, CAPILLARY
Glucose-Capillary: 89 mg/dL (ref 70–99)
Glucose-Capillary: 348 mg/dL — ABNORMAL HIGH (ref 70–99)

## 2012-08-28 LAB — URIC ACID: Uric Acid, Serum: 3.4 mg/dL — ABNORMAL LOW (ref 4.0–7.8)

## 2012-08-28 MED ORDER — INSULIN ASPART 100 UNIT/ML FLEXPEN: 0.0000 [IU] | PEN_INJECTOR | Freq: Three times a day (TID) | SUBCUTANEOUS | Status: DC

## 2012-08-28 MED ORDER — FAMCICLOVIR 500 MG PO TABS: 250.0000 mg | ORAL_TABLET | Freq: Every day | ORAL | Status: DC

## 2012-08-28 MED ORDER — HEPARIN SOD (PORK) LOCK FLUSH 100 UNIT/ML IV SOLN
INTRAVENOUS | Status: AC
  Filled 2012-08-28: qty 5

## 2012-08-28 MED ORDER — ALLOPURINOL 300 MG PO TABS: 300.0000 mg | ORAL_TABLET | Freq: Every day | ORAL | Status: DC

## 2012-08-28 MED ORDER — INSULIN PEN NEEDLE 31G X 6 MM MISC: Status: DC

## 2012-08-28 MED ORDER — GLUCERNA SHAKE PO LIQD: 237.0000 mL | Freq: Three times a day (TID) | ORAL | Status: DC

## 2012-08-28 MED ORDER — SULFAMETHOXAZOLE-TMP DS 800-160 MG PO TABS: 1.0000 | ORAL_TABLET | ORAL | Status: DC

## 2012-08-28 MED ORDER — INSULIN ASPART 100 UNIT/ML FLEXPEN: 4.0000 [IU] | PEN_INJECTOR | Freq: Three times a day (TID) | SUBCUTANEOUS | Status: DC

## 2012-08-28 MED ORDER — METOPROLOL TARTRATE 25 MG PO TABS: 25.0000 mg | ORAL_TABLET | Freq: Two times a day (BID) | ORAL | Status: DC

## 2012-08-28 MED ORDER — PREDNISONE 20 MG PO TABS: 80.0000 mg | ORAL_TABLET | Freq: Every day | ORAL | Status: DC

## 2012-08-28 MED ORDER — GLUCOSE BLOOD VI STRP: ORAL_STRIP | Status: DC

## 2012-08-28 NOTE — Discharge Summary (Signed)
Physician Discharge Summary  Alston Berrie WUJ:[id number] DOB: 04/07/1945 DOA: 08/20/2012  PCP: [name], MD  Admit date: 08/20/2012 Discharge date: 08/28/2012  Time spent: Greater than 30 minutes  Recommendations for Outpatient Follow-up:  1. Dr. Gwendlyn Deutscher, PCP on 09/01/2012 for DM review and Insulin adjustment. 2. Dr. [name], Oncology in 1 week with repeat labs (CBC, CMP, LDH & Uric Acid) 3. Dr. [name], Cardiology: MD's office will call with appointments for OP stress test and follow up. 4. Recommend outpatient followup of thyroid nodule as deemed necessary. 5. Elevated PSA: Outpatient followup as deemed necessary.  Discharge Diagnoses:  Principal Problem:   Generalized weakness Active Problems:   Anemia of chronic disease   Transaminitis   Metastatic cancer   Protein-calorie malnutrition, severe   DM (diabetes mellitus), type 2, uncontrolled   Dilated cardiomyopathy   Sinus tachycardia   NHL (non-Hodgkin's lymphoma)   Discharge Condition: Improved & Stable  Diet recommendation: Heart Healthy & Diabetic Diet.  Filed Weights   08/20/12 1908 08/21/12 0244 08/26/12 1509  Weight: 75.297 kg (166 lb) 76.6 kg (168 lb 14 oz) 78.155 kg (172 lb 4.8 oz)    History of present illness:  Pt is 67 yo male who presented to [facility] ED with main concern of progressively worsening generalized weakness, that he initially noted several months prior to this admission, associated with lower back pain, constant and sharp, 10/10 in severity with no specific alleviating factors or aggravating factors. He was started on prednisone by his PCP. Positive for 40 pounds weight loss and decreased appetite over the last month. He stated he has had US of the liver and kidney done the day prior to admission and was told no acute findings noted. He had prior history of non -Hodgkin lymphoma in 1995 treated at [facility] but has been stable since. Pt also complained of left lower extremity weakness to  a point where he has to lift his leg up to move it. In ED, pt had MRI done of the lumbar and thoracic spine and findings worrisome for metastatic disease of unknown primary. TRH asked to admit for further evaluation.   Hospital Course:  1. Generalized weakness with mostly left lower extremity weakness; This is likely secondary to the widely metastatic disease noted in vertebrae. No evidence for cord compression. MRi Brain is unremarkable. PT/OT. Pain controlled. Patient feels stronger. 2. Metastatic disease/high-grade non-Hodgkin's lymphoma: Appreciate Dr. Gustavo Lah input. CT guided retroperitoneal lymph node Bx and Bone marrow bx done. Bone marrow results were pending at time of discharge. Lymph node biopsy confirmed high-grade non-Hodgkin's lymphoma. Patient started chemotherapy on 08/26/12 and tolerated same. He was closely followed by oncologist in the hospital and will be closely followed by them OP 3. Sinus Tachycardia/Cardiomyopathy: ECHO reveals cardiomyopathy with hypokinesis. He has risk factors for CAD. He will be getting chemotherapy for lymphoma and some of those agents could affect cardiac function. Continue beta blocker for now. Patient denies chest pain or shortness of breath. TSh was normal. Stopped IVF. Cardiology input appreciated. MUGA scan on 7/8: Mildly decreased left ventricle EF of 43% without focal wall motion abnormalities. Discussed with cardiology who will arrange for outpatient stress test and followup. 4. Hyponatremia/Hypokalemia: ?dehydration vs related to cancer. Improvement with trial of IVF. Can check Urine osm though results may be unreliable as he has received fluids. Resolved. 5. Thyroid Nodule: TSH and FT4 are normal. Will need follow up for same as OP.  6. Transaminitis: Possibly due to Liver metastatic disease.  Monitor.  7. Diabetes Mellitus 2: Made worse by steroids. A1c is 8.8. Patient was initially started on IV Decadron and then high dose by mouth prednisone as part  of chemotherapy (R-CHOP). There was overlap of IV Decadron on prednisone on one day which caused significantly elevated CBGs. His Lantus, mealtime NovoLog and SSI were adjusted. On day of discharge, fasting CBG was 89. He will get 2 more days of high-dose prednisone through 08/30/12. Once steroid stops, insulin requirement will decrease and insulins will need to be adjusted likewise. He was discharged on insulin regimen as below which will obviously need to be titrated as OP. The patient and spouse were extensively educated regarding DM management by M.D., nursing and diabetes coordinator. This will need close OP followup. 8. Severe protein calorie malnutrition. Management per nutrition consult. 9. Anemia of chronic disease: Anemia panel reviewed. Hemoglobin 8 g per DL. Oncology transfused 2 units of PRBCs. Improved 10. History of HTN: Monitor BP. Continue Losartan. Add BB.  Consultants:  Medical oncology  Cardiology  Procedures:  CT guided biopsy of retroperitoneal lymph node 7/7  CT-guided bone marrow biopsy 7/8  Port-A-Cath placement  Discharge Exam:  Complaints: Patient felt stronger and denied complaints. Anxious to go home.  Filed Vitals:   08/27/12 1900 08/27/12 2211 08/28/12 0635 08/28/12 1052  BP: 163/87 141/72 146/80 136/73  Pulse: 84 94 89 100  Temp: 97.4 F (36.3 C) 98.3 F (36.8 C) 98.4 F (36.9 C)   TempSrc: Oral Oral Oral   Resp: 18 16 16 20   Height:      Weight:      SpO2:  98% 97%      General exam: Comfortable.  Respiratory system: Clear. No increased work of breathing.  Cardiovascular system: S1 and S2 heard, RRR. No JVD, murmurs or pedal edema.  Gastrointestinal system: Abdomen is nondistended, soft and nontender. Normal bowel sounds heard.  Central nervous system: Alert and oriented. No focal neurological deficits.  Extremities: Symmetric 5 x 5 power.  Discharge Instructions      Discharge Orders   Future Orders Complete By Expires     Call MD  for:  difficulty breathing, headache or visual disturbances  As directed     Call MD for:  extreme fatigue  As directed     Call MD for:  persistant dizziness or light-headedness  As directed     Call MD for:  persistant nausea and vomiting  As directed     Call MD for:  redness, tenderness, or signs of infection (pain, swelling, redness, odor or green/yellow discharge around incision site)  As directed     Call MD for:  severe uncontrolled pain  As directed     Call MD for:  temperature >100.4  As directed     Diet - low sodium heart healthy  As directed     Diet Carb Modified  As directed     Increase activity slowly  As directed     TREATMENT CONDITIONS  As directed     Comments:      Notify the MD for the following lab values: ANC < 1500, PLT < 100K, Hemoglobin < 8.5, Creatinine > 1.5, urine output < 200 ml prior to cisplatin.  If labs are abnormal OR no lab data is available, MD must be notified and order obtained to begin chemotherapy.        Medication List         allopurinol 300 MG tablet  Commonly known as:  ZYLOPRIM  Take 1 tablet (300 mg total) by mouth daily.     cyclobenzaprine 10 MG tablet  Commonly known as:  FLEXERIL  Take 10 mg by mouth 3 (three) times daily as needed for muscle spasms.     famciclovir 500 MG tablet  Commonly known as:  FAMVIR  Take 0.5 tablets (250 mg total) by mouth daily.     feeding supplement Liqd  Take 237 mLs by mouth 3 (three) times daily between meals.     insulin aspart 100 UNIT/ML Sopn FlexPen  Commonly known as:  NOVOLOG FLEXPEN  Inject 4 Units into the skin 3 (three) times daily with meals.     insulin aspart 100 UNIT/ML Sopn FlexPen  Commonly known as:  NOVOLOG FLEXPEN  - Inject 0-9 Units into the skin 3 (three) times daily with meals. CBG < 70: eat or drink something sweet & recheck blood sugar.  - CBG 70 - 120: 0 units   - CBG 121 - 150: 1 unit   - CBG 151 - 200: 2 units   - CBG 201 - 250: 3 units   - CBG 251 - 300:  5 units   - CBG 301 - 350: 7 units   - CBG 351 - 400: 9 units   - CBG > 400: call MD     Insulin Pen Needle 31G X 6 MM Misc  Use with insulin Pen     LANTUS SOLOSTAR 100 UNIT/ML Sopn  Generic drug:  Insulin Glargine  Inject 20 Units into the skin every evening.     losartan 50 MG tablet  Commonly known as:  COZAAR  Take 50 mg by mouth daily.     metoprolol tartrate 25 MG tablet  Commonly known as:  LOPRESSOR  Take 1 tablet (25 mg total) by mouth 2 (two) times daily.     oxycodone 5 MG capsule  Commonly known as:  OXY-IR  Take 5 mg by mouth every 6 (six) hours as needed for pain.     predniSONE 20 MG tablet  Commonly known as:  DELTASONE  Take 4 tablets (80 mg total) by mouth daily with breakfast. Take on 7/12 & 7/13 then stop.     sulfamethoxazole-trimethoprim 800-160 MG per tablet  Commonly known as:  BACTRIM DS  Take 1 tablet by mouth 3 (three) times a week. On Mon/Wed/Fridays.       Follow-up Information   Follow up with [facility] Valrie Hart., MD. Schedule an appointment as soon as possible for a visit on 09/01/2012. (Blood sugars to be reviewed and Insulins adjusted.)    Contact information:   [address] [place] [address] [phone]       Follow up with [name], MD. Schedule an appointment as soon as possible for a visit in 1 week. (To be seen with repeat labs (CBC, CMP, LDH, Uric Acid))    Contact information:   [address] [place] [address] [phone]        The results of significant diagnostics from this hospitalization (including imaging, microbiology, ancillary and laboratory) are listed below for reference.    Significant Diagnostic Studies: Ct Chest W Contrast  08/21/2012   *RADIOLOGY REPORT*  Clinical Data:  Malignancy.  CT CHEST, ABDOMEN AND PELVIS WITH CONTRAST  Technique:  Multidetector CT imaging of the chest, abdomen and pelvis was performed following the standard protocol during bolus administration of  intravenous contrast.  Contrast: OMNIPAQUE IOHEXOL 300 MG/ML  SOLN  Comparison:   None.  CT CHEST  Findings:  Nodule on the left thyroid lobe measures 23 mm.  The appearance on CT is nonspecific. There is no axillary adenopathy. No mediastinal or hilar adenopathy.  Three-vessel aortic arch. Aorta and branch vessels appear within normal limits.  Grossly, the pulmonary arteries are within normal limits.  No effusion.  The lungs demonstrate scattered areas of atelectasis.  No pulmonary mass lesion.  The central airways are patent.  IMPRESSION:  1.  23 mm left thyroid lobe nodule. Consider further evaluation with thyroid ultrasound.  If patient is clinically hyperthyroid, consider nuclear medicine thyroid uptake and scan. 2.  No evidence of primarily density in the chest.  CT ABDOMEN AND PELVIS  Findings:  Liver:  Nonspecific 11 mm low density lesion is present in the right hepatic lobe (image number 46 series 2).  Other small areas of decreased attenuation are present in the right hepatic lobe, too small to characterize.  All of these lesions are indeterminant on today's study.  Spleen:  Normal.  Gallbladder:  Contracted.  Common bile duct:  Normal.  Pancreas:  Normal.  Adrenal glands:  Right adrenal gland normal.  The left adrenal gland appears normal however there is a nodule at the genu head is inseparable from the gland itself.  This nodule probably represents a retroperitoneal lymph node and measures 17 mm x 18 mm with surrounding inflammatory changes.  Kidneys:  Exophytic right upper pole simple renal cyst is present that measures 3 cm.  Left upper pole nonobstructing renal collecting system calculus measures 5 mm.  Normal renal enhancement and delayed excretion.  Both ureters appear within normal limits.  Retroperitoneal adenopathy is present.  The largest lymph node measures 6 cm AP by 2 cm transverse (image number 65 series 2). Other smaller periaortic lymph nodes are present.  Stomach:  No mass lesion.   Grossly normal.  Small bowel:  Normal appearance of duodenum.  Small bowel is decompressed.  No obstruction.  There is no mesenteric adenopathy. Mesenteric vasculature appears normal.  Colon:   Normal appendix.  Colonic diverticulosis.  No discrete mass lesion is identified.  Diverticular hypertrophy of the sigmoid.  Pelvic Genitourinary:  Prostatomegaly.  Eccentrically enlarged prostate gland to the right.  Bones:  No pathologic fracture is identified.  The marrow infiltration seen on MRI is poorly appreciated on CT.  There is some heterogeneous mineralization but no focal destructive osseous lesion.  Hip osteoarthritis is present.  No pathologic fracture is identified.  Nonspecific sclerotic lesion in the medial left iliac bone noted. Potentially this represents a bone island.  Vasculature: Atherosclerosis.  No acute vascular abnormality.  No aneurysm.  Body Wall: Within normal limits.  IMPRESSION: 1.  Retroperitoneal adenopathy.  Although nonspecific, this probably represents lymphoma. Metastatic lymphadenopathy is also possible.  Largest index node is interposed between the aorta and the left kidney measuring 6 cm x 2 cm. 2.  Indeterminant small low-density right hepatic lobe lesions. Metastatic disease is impossible to exclude. 3.  Right renal cyst. 4.  5 mm nonobstructing left upper pole renal collecting system calculus.   Original Report Authenticated By: Andreas Newport, M.D.   Mr Laqueta Jean Wo Contrast  08/24/2012   *RADIOLOGY REPORT*  Clinical Data: 67 year old male with remote history of non-Hodgkins lymphoma. Abnormal abdominal lymph node recently biopsied. Confusion. Staging.  MRI HEAD WITHOUT AND WITH CONTRAST  Technique:  Multiplanar, multiecho pulse sequences of the brain and surrounding structures were obtained according to standard  protocol without and with intravenous contrast  Contrast: 15mL MULTIHANCE GADOBENATE DIMEGLUMINE 529 MG/ML IV SOLN  Comparison: None.  Findings: Cerebral volume is within  normal limits for age.  No restricted diffusion to suggest acute infarction.  No midline shift, mass effect, evidence of mass lesion, ventriculomegaly, extra-axial collection or acute intracranial hemorrhage. Cervicomedullary junction and pituitary are within normal limits. Negative visualized cervical spinal cord.  Major intracranial vascular flow voids are preserved.  Left lentiform nuclei T2 and FLAIR hyperintensity compatible with small chronic lacunar infarcts.  Elsewhere, normal gray and white matter signal for age.  No abnormal enhancement of the brain.  Incidental right anterior frontal lobe developmental venous anomaly.  A punctate focus of high signal in the superior cerebellum following contrast seen only on sagittal image 14 (series 13) is felt to be vascular related.  Bone marrow signal is heterogeneous and abnormal in the clivus, the occipital condyles, and throughout the visualized cervical spine. The calvarium appears spared at this time.  No overtly destructive osseous lesion is identified.  Visualized orbit soft tissues are within normal limits.  Visualized paranasal sinuses and mastoids are clear.  Grossly normal visualized internal auditory structures.  Negative scalp soft tissues.  IMPRESSION: 1. No acute intracranial abnormality or metastatic disease to the brain identified. 2.  Abnormal bone marrow signal at the skull base and in the cervical spine is suspicious for lymphomatous involvement in this setting, but red marrow reactivation due to anemia could also have this appearance. 3. Mild for age chronic small vessel ischemia.   Original Report Authenticated By: Erskine Speed, M.D.   Mr Thoracic Spine W Wo Contrast  08/20/2012   *RADIOLOGY REPORT*  Clinical Data:  Back pain  MRI THORACIC AND LUMBAR SPINE WITHOUT AND WITH CONTRAST  Technique:  Multiplanar and multiecho pulse sequences of the thoracic and lumbar spine were obtained without and with intravenous contrast.  Contrast: 15mL  MULTIHANCE GADOBENATE DIMEGLUMINE 529 MG/ML IV SOLN  Comparison:   None.  MRI THORACIC SPINE  Findings: The vertebral bodies are normally aligned with preservation of the normal thoracic kyphosis.  There is no listhesis.  Vertebral body heights are preserved.  Signal intensity throughout the visualized vertebral body bone marrow is diffusely abnormal with a mottled T1 hypointense, T2 hyperintense appearance.  There is diffuse patchy heterogeneous enhancement seen throughout all the visualized bone marrow. Findings are most consistent with diffuse osseous metastases. No epidural extension of tumor is seen. A single discrete lesion is difficult to measure and demarcated.  There are no epidural collections.  No significant canal or neural foraminal impingement is identified. No pathologic fracture.  Incidental note is made of an exophytic right renal cyst.   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  MRI LUMBAR SPINE  Findings: The vertebral bodies are normally aligned with preservation of the normal lumbar lordosis.  There is no listhesis. Vertebral body heights are preserved.  Signal intensity throughout the visualized vertebral body bone marrow is diffusely abnormal with a mild T1 hypointense, T2 hyperintense appearance.  There is diffuse patchy heterogeneous enhancement on post contrast sequences.  Findin similar changes are seen within the bones of the pelvis.  gs are consistent with diffuse osseous metastases.  It is difficult to measure a single discrete lesion, however, enhancement is most evident within the L3 vertebral body. No epidural tumor.  No pathologic fracture.  At L5-S1, postoperative changes from prior right hemilaminectomy are seen.  There appears to be a small amount of  enhancing scar tissue along the right anterolateral aspect of the thecal sac not significant canal impingement.  There is diffuse degenerative disc bulge with disc desiccation and bilateral facet  arthrosis at this level. No evident recurrent disc herniation.  There is resultant moderate bilateral foraminal narrowing.  No other significant disc disease identified within the lumbar spine.  IMPRESSION:  1.  Diffuse abnormal heterogeneous signal intensity throughout the vertebral body bone marrow with heterogeneous enhancement, worrisome for diffuse osseous metastases. 2. Postsurgical changes from prior right hemilaminectomy at L5-S1 with a small amount of enhancing scar tissue formation along the right lateral aspect of the thecal sac.  Degenerative disc disease at L5-S1 as above without recurrent focal disc herniation.  Critical Value/emergent results were called by telephone at the time of interpretation on 08/20/2012 at 10:50 p.m. to [facility], who verbally acknowledged these results.   Original Report Authenticated By: Rise Mu, M.D.   Nm Cardiac Muga Rest  08/25/2012   *RADIOLOGY REPORT*  Clinical Data: Non-Hodgkins lymphoma, pre toxic chemotherapy  NUCLEAR MEDICINE CARDIAC MULTIPLE UPTAKE GATED ACQUISITION SCAN (CARDIAC MUGA)  Technique: Radiolabeled red blood cells used to perform resting radionuclide ventriculography. Imaging performed in the anterior, LAO, and lateral projections.  Resting left ventricular ejection fraction estimated after drawing region of interest curves around the left ventricle during systole and diastole.  Radiopharmaceutical: 25 mCi Tc-64m pertechnetate labeled autologous red cells  Comparison: None  Findings: Left ventricular ejection fraction is calculated at 43%, below the normal range. Acquisition performed at a cardiac rate of 110 beats per minute. Wall motion analysis in three projections shows no focal wall motion abnormalities.  IMPRESSION: Mildly decreased left ventricular ejection fraction of 43% without focal wall motion abnormalities.   Original Report Authenticated By: Ulyses Southward, M.D.   Ir Fluoro Guide Cv Line Right  08/25/2012   *RADIOLOGY  REPORT*  Clinical Data: History of lymphoma.  Needs Port-A-Cath for chemotherapy.  FLUOROSCOPIC AND ULTRASOUND GUIDED PLACEMENT OF A SUBCUTANEOUS PORT.  Physician: Rachelle Hora. Lowella Dandy, MD  Medications:The patient was sedated for the bone marrow biopsy that immediately proceeded this procedure. A radiology nurse monitored the patient for moderate sedation.  Ancef 2 gm.   As antibiotic prophylaxis, Ancef  was ordered pre- procedure and administered intravenously within one hour of incision.  Moderate sedation time:25 minutes  Fluoroscopy time: 24 seconds  Procedure:  The risks of the procedure were explained to the patient.  Informed consent was obtained.  Patient was placed supine on the interventional table.  Ultrasound confirmed a patent right internal jugular vein.  The right chest and neck were cleaned with a skin antiseptic and a sterile drape was placed.  Maximal barrier sterile technique was utilized including caps, mask, sterile gowns, sterile gloves, sterile drape, hand hygiene and skin antiseptic. The right neck was anesthetized with 1% lidocaine.  Small incision was made in the right neck with a blade.  Micropuncture set was placed in the right IJ with ultrasound guidance.  The micropuncture wire was used for measurement purposes.  The right chest was anesthetized with 1% lidocaine with epinephrine.  #15 blade was used to make an incision and a subcutaneous port pocket was formed. 8 french Power Port was assembled.  Subcutaneous tunnel was formed with a stiff tunneling device.  The port catheter was brought through the subcutaneous tunnel.  The port was placed in the subcutaneous pocket.  The micropuncture set was exchanged for a peel-away sheath.  The catheter was placed through the peel-away sheath  and the tip was positioned in the SVC.  Catheter placement was confirmed with fluoroscopy.  The port was accessed and flushed with heparinized saline.  The port pocket was closed using two layers of absorbable  sutures and Dermabond.  The vein skin site was closed using a single layer of absorbable suture and Dermabond. Sterile dressings were applied.  Patient tolerated the procedure well without an immediate complication.  Ultrasound and fluoroscopic images were taken and saved for this procedure.  Complications: None  Impression:  Placement of a subcutaneous port device.  The catheter tip is in the SVC and ready to be used.   Original Report Authenticated By: Richarda Overlie, M.D.   Ct Biopsy  08/25/2012   *RADIOLOGY REPORT*  Clinical history: 67 year old with anemia and history of lymphoma.   PROCEDURE(S): CT GUIDED BONE MARROW ASPIRATES AND BIOPSY  Physician: Rachelle Hora. Henn, MD   Medications: Versed 2 mg, Fentanyl 100 mcg.  A radiology nurse monitored the patient for moderate sedation.  Sedation time:  25 minutes   Procedure: The procedure was explained to the patient. The risks and benefits of the procedure were discussed and the patient's questions were addressed.  Informed consent was obtained from the patient. The patient was placed prone on CT scan. Images of the pelvis were obtained. The right side of back was prepped and draped in sterile fashion. The skin and right posterior iliac bone were anesthetized with 1% lidocaine.   11 gauge bone needle was directed into the right iliac bone with CT guidance.  It was very difficult to obtain aspirate material.  A total of four aspirates were obtained.  Two core biopsies were performed and one adequate core specimen was obtained.  Findings:Biopsy needle directed into the posterior right iliac bone.  Complications: None   Impression: CT guided bone marrow aspirates and core biopsy.   Original Report Authenticated By: Richarda Overlie, M.D.   Ct Biopsy  08/24/2012   *RADIOLOGY REPORT*  Clinical Data: Left retroperitoneal lymph node mass and history of non-Hodgkin's lymphoma.  CT GUIDED CORE BIOPSY OF RETROPERITONEAL MASS  Sedation:   2.0 mg IV Versed;  50 mcg IV Fentanyl  Total  Moderate Sedation Time: 20 minutes.  Procedure:  The procedure risks, benefits, and alternatives were explained to the patient.  Questions regarding the procedure were encouraged and answered.  The patient understands and consents to the procedure.  The left paraspinous region was prepped with Betadine in a sterile fashion, and a sterile drape was applied covering the operative field.  A sterile gown and sterile gloves were used for the procedure.  Local anesthesia was provided with 1% Lidocaine.  CT was performed in a prone position.  Under CT guidance, a 17 gauge trocar needle was advanced from a left paraspinous approach into the retroperitoneal space.  After confirming needle tip position, coaxial 18-gauge core biopsy samples were obtained and submitted in saline.  A total of six core biopsy samples were obtained and submitted.  Complications: None  Findings: Left-sided para-aortic retroperitoneal lymph node mass measures approximately 2.6 x 5.5 cm.  Solid tissue was obtained. Postbiopsy CT imaging shows no evidence of hemorrhage or other complication immediately following the procedure.  IMPRESSION: CT guided core biopsy performed of a left para-aortic retroperitoneal lymph node mass.   Original Report Authenticated By: Irish Lack, M.D.   Dg Chest Port 1 View  08/20/2012   *RADIOLOGY REPORT*  Clinical Data: Tachycardia  PORTABLE CHEST - 1 VIEW  Comparison: None.  Findings:  Lungs are clear. No pleural effusion or pneumothorax.  The heart is top normal in size.  IMPRESSION: No evidence of acute cardiopulmonary disease.   Original Report Authenticated By: Charline Bills, M.D.    Microbiology: No results found for this or any previous visit (from the past 240 hour(s)).   Labs: Basic Metabolic Panel:  Recent Labs Lab 08/23/12 0525 08/24/12 0505 08/25/12 0520 08/27/12 0540 08/28/12 0555  NA 133* 129* 134* 139 135  K 4.2 3.7 3.3* 3.8 3.6  CL 97 94* 99 104 101  CO2 21 23 25 24 25   GLUCOSE  203* 266* 218* 157* 99  BUN 15 15 20  30* 28*  CREATININE 0.74 0.76 0.83 0.82 0.80  CALCIUM 9.2 8.8 9.3 8.4 8.6   Liver Function Tests:  Recent Labs Lab 08/22/12 0513 08/24/12 0505 08/28/12 0555  AST 61* 49* 92*  ALT 84* 49 151*  ALKPHOS 316* 491* 327*  BILITOT 0.3 0.3 0.3  PROT 6.3 5.7* 5.4*  ALBUMIN 2.0* 1.8* 1.8*   No results found for this basename: LIPASE, AMYLASE,  in the last 168 hours No results found for this basename: AMMONIA,  in the last 168 hours CBC:  Recent Labs Lab 08/23/12 0525 08/24/12 0505 08/25/12 0520 08/27/12 0540 08/28/12 0555  WBC 8.5 8.0 5.3 7.8 12.4*  HGB 9.7* 9.0* 8.9* 8.0* 9.7*  HCT 29.3* 27.3* 27.7* 24.6* 29.4*  MCV 82.1 81.3 82.9 82.3 82.1  PLT 281 232 188 202 156   Cardiac Enzymes: No results found for this basename: CKTOTAL, CKMB, CKMBINDEX, TROPONINI,  in the last 168 hours BNP: BNP (last 3 results) No results found for this basename: PROBNP,  in the last 8760 hours CBG:  Recent Labs Lab 08/27/12 0748 08/27/12 1137 08/27/12 1714 08/27/12 2207 08/28/12 0757  GLUCAP 156* 297* 261* 189* 89    Additional labs:  Lymph node biopsy results: Diagnosis Lymph node, biopsy, left retroperitoneal - LARGE B-CELL LYMPHOMA  Tissue-Flow Cytometry - NO MONOCLONAL B CELL POPULATION OR ABNORMAL T CELL PHENOTYPE IDENTIFIED.   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  Bone Marrow Flow Cytometry - NO MONOCLONAL B CELL POPULATION OR ABNORMAL T CELL PHENOTYPE IDENTIFIED.   Anemia panel: Iron 52, TIBC 191, saturation 27, ferritin 7487, folate 7.9, vitamin B 12: 250, reticulocytes 35.5  Hemoglobin A1c: 8.9  TSH: 2.694, free T4: 1.43  PSA: 4.74  2-D echo Study Conclusions  - Left ventricle: The cavity size was normal. Systolic function was normal. The estimated  ejection fraction was 40%. There is severe hypokinesis of the mid-distalanteroseptal myocardium. - Aortic valve: Trivial regurgitation.    Signed:  Khasir Woodrome  [facility] 08/28/2012, 12:15 PM

## 2012-08-28 NOTE — Progress Notes (Signed)
Keith Duffy is feeling better. He got 2 units of blood yesterday. This did help him. His appetite is improving. Is not having pain issues.  He likely is ready to go home today. I will follow him up in my office in one week.  He must go home on the them famciclovir and Bactrim.  His blood sugars are still quite high. He and his wife must have good  Instructions for monitoring his blood sugars when he goes home. Hopefully, the nurses or diabetic coordinator can help with this.  His LDH continues to come down. It now is 2083.  Is potassium is 3.6. Renal function still looks good. I do not see any evidence of tumor lysis.  His vital signs are all stable. I don't see any focal findings on his physical exam. There is no palpable abdominal mass. He has no palpable hepatospleno megaly.  His bone marrow biopsy is still not back yet. In speaking with the pathologist, he does not feel that there is any marrow involvement. However, additional tests are being run.  He really looks much better than when I saw him a week ago. Thankfully, we have a malignancy that we can treat and hopefully cure.  I will see him back in the office in one week. I will call him.  Again, please make sure that he and his wife have proper instructions for monitoring his blood sugars and a sliding scale insulin program to cover hyperglycemia. This is critically important if he is to go home and do well.  Keith Duffy 5:6-7

## 2012-08-28 NOTE — Progress Notes (Signed)
Inpatient Diabetes Program Recommendations  AACE/ADA: New Consensus Statement on Inpatient Glycemic Control (2013)  Target Ranges:  Prepandial:   less than 140 mg/dL      Peak postprandial:   less than 180 mg/dL (1-2 hours)      Critically ill patients:  140 - 180 mg/dL     Spoke with Dr. Waymon Amato extensively about this patient's discharge insulin needs.  Decision made to send patient home on Lantus 20 units QHS, Novolog 4 units tid with meals (hold if pre-meal CBG <120 mg/dl, hold if patient does not eat at least 50% of meal), and Novolog Sensitive SSI tid.  Spoke with patient and his wife about the planned home insulin regimen.  Used teach back method and reviewed several scenarios.  Gave patient and his wife a copy of the written instructions and a copy of the SSI.  Encouraged patient to follow back up with his PCP at [facility] on Tuesday.  Explained to patient and wife that once the steroids stop, that patient may experience a drop in his CBGs.  Encouraged patient and wife to check patient's CBGs tid before meals and to continue to record the results.  Patient and wife seemed nervous but capable of handling this detailed insulin regimen.   Will follow. Ambrose Finland RN, MSN, CDE Diabetes Coordinator Inpatient Diabetes Program [phone]

## 2012-08-28 NOTE — Progress Notes (Signed)
08/28/12 1230 Reviewed discharge instructions with patient and wife. Patient and wife both verbalized understanding of discharge instructions.  Copy of discharge instructions and prescriptions given to patient.

## 2012-08-28 NOTE — Progress Notes (Signed)
08/28/12 0900 Diabetes Teaching Booklet given to patient

## 2012-08-31 ENCOUNTER — Telehealth: Payer: Self-pay | Admitting: Hematology & Oncology

## 2012-08-31 NOTE — Telephone Encounter (Signed)
Pt aware of 7-18 appointment

## 2012-08-31 NOTE — Telephone Encounter (Signed)
Left message on home and wife's cell for pt to call for appointment on Friday.

## 2012-09-01 ENCOUNTER — Encounter: Payer: Self-pay | Admitting: Physician Assistant

## 2012-09-01 ENCOUNTER — Telehealth (HOSPITAL_COMMUNITY): Payer: Self-pay | Admitting: Cardiology

## 2012-09-01 ENCOUNTER — Inpatient Hospital Stay (HOSPITAL_COMMUNITY)
Admission: EM | Admit: 2012-09-01 | Discharge: 2012-09-05 | DRG: 314 | Disposition: A | Discharge status: Home / Self Care | Payer: Medicare Other | Attending: Internal Medicine | Admitting: Internal Medicine

## 2012-09-01 ENCOUNTER — Ambulatory Visit (INDEPENDENT_AMBULATORY_CARE_PROVIDER_SITE_OTHER): Payer: Medicare Other | Admitting: Physician Assistant

## 2012-09-01 ENCOUNTER — Emergency Department (HOSPITAL_COMMUNITY): Payer: Medicare Other

## 2012-09-01 ENCOUNTER — Encounter (HOSPITAL_COMMUNITY): Payer: Self-pay

## 2012-09-01 VITALS — BP 62/48 | HR 115 | Ht 69.0 in | Wt 161.0 lb

## 2012-09-01 DIAGNOSIS — T451X5A Adverse effect of antineoplastic and immunosuppressive drugs, initial encounter: Secondary | ICD-10-CM | POA: Diagnosis present

## 2012-09-01 DIAGNOSIS — R000 Tachycardia, unspecified: Secondary | ICD-10-CM

## 2012-09-01 DIAGNOSIS — D638 Anemia in other chronic diseases classified elsewhere: Secondary | ICD-10-CM

## 2012-09-01 DIAGNOSIS — G8929 Other chronic pain: Secondary | ICD-10-CM | POA: Diagnosis present

## 2012-09-01 DIAGNOSIS — D61818 Other pancytopenia: Secondary | ICD-10-CM

## 2012-09-01 DIAGNOSIS — IMO0002 Reserved for concepts with insufficient information to code with codable children: Secondary | ICD-10-CM

## 2012-09-01 DIAGNOSIS — C8590 Non-Hodgkin lymphoma, unspecified, unspecified site: Secondary | ICD-10-CM

## 2012-09-01 DIAGNOSIS — I428 Other cardiomyopathies: Secondary | ICD-10-CM

## 2012-09-01 DIAGNOSIS — B370 Candidal stomatitis: Secondary | ICD-10-CM | POA: Diagnosis present

## 2012-09-01 DIAGNOSIS — E119 Type 2 diabetes mellitus without complications: Secondary | ICD-10-CM

## 2012-09-01 DIAGNOSIS — M549 Dorsalgia, unspecified: Secondary | ICD-10-CM | POA: Diagnosis present

## 2012-09-01 DIAGNOSIS — I10 Essential (primary) hypertension: Secondary | ICD-10-CM | POA: Diagnosis present

## 2012-09-01 DIAGNOSIS — R7401 Elevation of levels of liver transaminase levels: Secondary | ICD-10-CM

## 2012-09-01 DIAGNOSIS — Z79899 Other long term (current) drug therapy: Secondary | ICD-10-CM

## 2012-09-01 DIAGNOSIS — R531 Weakness: Secondary | ICD-10-CM

## 2012-09-01 DIAGNOSIS — I420 Dilated cardiomyopathy: Secondary | ICD-10-CM

## 2012-09-01 DIAGNOSIS — Z794 Long term (current) use of insulin: Secondary | ICD-10-CM

## 2012-09-01 DIAGNOSIS — R5081 Fever presenting with conditions classified elsewhere: Secondary | ICD-10-CM | POA: Diagnosis present

## 2012-09-01 DIAGNOSIS — I498 Other specified cardiac arrhythmias: Secondary | ICD-10-CM

## 2012-09-01 DIAGNOSIS — D709 Neutropenia, unspecified: Secondary | ICD-10-CM

## 2012-09-01 DIAGNOSIS — C8589 Other specified types of non-Hodgkin lymphoma, extranodal and solid organ sites: Secondary | ICD-10-CM

## 2012-09-01 DIAGNOSIS — I959 Hypotension, unspecified: Principal | ICD-10-CM

## 2012-09-01 DIAGNOSIS — D61810 Antineoplastic chemotherapy induced pancytopenia: Secondary | ICD-10-CM | POA: Diagnosis present

## 2012-09-01 DIAGNOSIS — E43 Unspecified severe protein-calorie malnutrition: Secondary | ICD-10-CM

## 2012-09-01 DIAGNOSIS — E1165 Type 2 diabetes mellitus with hyperglycemia: Secondary | ICD-10-CM

## 2012-09-01 DIAGNOSIS — C799 Secondary malignant neoplasm of unspecified site: Secondary | ICD-10-CM

## 2012-09-01 LAB — CBC WITH DIFFERENTIAL/PLATELET
Basophils Absolute: 0 10*3/uL (ref 0.0–0.1)
Eosinophils Absolute: 0 10*3/uL (ref 0.0–0.7)
Lymphocytes Relative: 56 % — ABNORMAL HIGH (ref 12–46)
MCHC: 33.6 g/dL (ref 30.0–36.0)
Neutrophils Relative %: 32 % — ABNORMAL LOW (ref 43–77)
RDW: 14.1 % (ref 11.5–15.5)

## 2012-09-01 LAB — POCT I-STAT, CHEM 8
Calcium, Ion: 1.13 mmol/L (ref 1.13–1.30)
Chloride: 100 mEq/L (ref 96–112)
HCT: 33 % — ABNORMAL LOW (ref 39.0–52.0)
Potassium: 3.8 mEq/L (ref 3.5–5.1)
Sodium: 134 mEq/L — ABNORMAL LOW (ref 135–145)

## 2012-09-01 LAB — URINALYSIS, ROUTINE W REFLEX MICROSCOPIC
Ketones, ur: NEGATIVE mg/dL
Leukocytes, UA: NEGATIVE
Nitrite: NEGATIVE
Specific Gravity, Urine: 1.026 (ref 1.005–1.030)
pH: 6 (ref 5.0–8.0)

## 2012-09-01 LAB — URINE MICROSCOPIC-ADD ON

## 2012-09-01 LAB — GLUCOSE, CAPILLARY: Glucose-Capillary: 114 mg/dL — ABNORMAL HIGH (ref 70–99)

## 2012-09-01 LAB — POCT I-STAT TROPONIN I

## 2012-09-01 MED ORDER — ENSURE PLUS PO LIQD: 237.0000 mL | Freq: Every day | ORAL | Status: DC

## 2012-09-01 MED ORDER — INSULIN ASPART 100 UNIT/ML ~~LOC~~ SOLN
0.0000 [IU] | Freq: Three times a day (TID) | SUBCUTANEOUS | Status: DC
  Administered 2012-09-02: 1 [IU] via SUBCUTANEOUS
  Administered 2012-09-02: 5 [IU] via SUBCUTANEOUS
  Administered 2012-09-02: 2 [IU] via SUBCUTANEOUS
  Administered 2012-09-03: 3 [IU] via SUBCUTANEOUS
  Administered 2012-09-03: 2 [IU] via SUBCUTANEOUS
  Administered 2012-09-03: 3 [IU] via SUBCUTANEOUS

## 2012-09-01 MED ORDER — SODIUM CHLORIDE 0.9 % IV SOLN
INTRAVENOUS | Status: AC
  Administered 2012-09-01 – 2012-09-02 (×2): via INTRAVENOUS

## 2012-09-01 MED ORDER — OXYCODONE HCL 5 MG PO TABS: 5.0000 mg | ORAL_TABLET | ORAL | Status: DC | PRN

## 2012-09-01 MED ORDER — ACETAMINOPHEN 650 MG RE SUPP: 650.0000 mg | Freq: Four times a day (QID) | RECTAL | Status: DC | PRN

## 2012-09-01 MED ORDER — FAMCICLOVIR 500 MG PO TABS
250.0000 mg | ORAL_TABLET | Freq: Every day | ORAL | Status: DC
  Administered 2012-09-02 – 2012-09-05 (×4): 250 mg via ORAL
  Filled 2012-09-01 (×4): qty 0.5

## 2012-09-01 MED ORDER — ONDANSETRON HCL 4 MG/2ML IJ SOLN: 4.0000 mg | Freq: Four times a day (QID) | INTRAMUSCULAR | Status: DC | PRN

## 2012-09-01 MED ORDER — ONDANSETRON HCL 4 MG PO TABS: 4.0000 mg | ORAL_TABLET | Freq: Four times a day (QID) | ORAL | Status: DC | PRN

## 2012-09-01 MED ORDER — SODIUM CHLORIDE 0.9 % IV BOLUS (SEPSIS)
1000.0000 mL | Freq: Once | INTRAVENOUS | Status: AC
  Administered 2012-09-01: 1000 mL via INTRAVENOUS

## 2012-09-01 MED ORDER — ENOXAPARIN SODIUM 40 MG/0.4ML ~~LOC~~ SOLN
40.0000 mg | SUBCUTANEOUS | Status: DC
  Administered 2012-09-01 – 2012-09-04 (×4): 40 mg via SUBCUTANEOUS
  Filled 2012-09-01 (×5): qty 0.4

## 2012-09-01 MED ORDER — SODIUM CHLORIDE 0.9 % IJ SOLN
3.0000 mL | Freq: Two times a day (BID) | INTRAMUSCULAR | Status: DC
  Administered 2012-09-02 (×2): 3 mL via INTRAVENOUS

## 2012-09-01 MED ORDER — INSULIN GLARGINE 100 UNIT/ML ~~LOC~~ SOLN
20.0000 [IU] | Freq: Every evening | SUBCUTANEOUS | Status: DC
  Administered 2012-09-01 – 2012-09-03 (×3): 20 [IU] via SUBCUTANEOUS
  Filled 2012-09-01 (×3): qty 0.2

## 2012-09-01 MED ORDER — CYCLOBENZAPRINE HCL 10 MG PO TABS
10.0000 mg | ORAL_TABLET | Freq: Three times a day (TID) | ORAL | Status: DC | PRN
  Filled 2012-09-01: qty 1

## 2012-09-01 MED ORDER — ACETAMINOPHEN 325 MG PO TABS: 650.0000 mg | ORAL_TABLET | Freq: Four times a day (QID) | ORAL | Status: DC | PRN

## 2012-09-01 MED ORDER — HYDROCORTISONE SOD SUCCINATE 100 MG IJ SOLR
100.0000 mg | Freq: Once | INTRAMUSCULAR | Status: AC
  Administered 2012-09-01: 100 mg via INTRAVENOUS
  Filled 2012-09-01: qty 2

## 2012-09-01 MED ORDER — DEXTROSE 5 % IV SOLN
2.0000 g | Freq: Three times a day (TID) | INTRAVENOUS | Status: DC
  Administered 2012-09-01 – 2012-09-04 (×8): 2 g via INTRAVENOUS
  Filled 2012-09-01 (×10): qty 2

## 2012-09-01 MED ORDER — ALLOPURINOL 300 MG PO TABS
300.0000 mg | ORAL_TABLET | Freq: Every day | ORAL | Status: DC
  Administered 2012-09-02: 300 mg via ORAL
  Filled 2012-09-01 (×2): qty 1

## 2012-09-01 MED ORDER — SODIUM CHLORIDE 0.9 % IJ SOLN
10.0000 mL | INTRAMUSCULAR | Status: DC | PRN
  Administered 2012-09-03: 10 mL

## 2012-09-01 MED ORDER — ENSURE COMPLETE PO LIQD
237.0000 mL | Freq: Every day | ORAL | Status: DC
  Administered 2012-09-02 – 2012-09-03 (×2): 237 mL via ORAL

## 2012-09-01 MED ORDER — SULFAMETHOXAZOLE-TMP DS 800-160 MG PO TABS
1.0000 | ORAL_TABLET | ORAL | Status: DC
  Administered 2012-09-02: 1 via ORAL
  Filled 2012-09-01: qty 1

## 2012-09-01 NOTE — Assessment & Plan Note (Signed)
EKG shows a normal sinus rhythm however with a ventricular rate of 100. Does have some T-wave abnormalities in leads 1 and aVL.

## 2012-09-01 NOTE — Patient Instructions (Addendum)
Proceed to [facility] ER for BP management.

## 2012-09-01 NOTE — ED Notes (Signed)
Pt states d/c'd from here last Friday, first chemo Thursday, c/o weakness, sent here from PCP d/t hypotension.

## 2012-09-01 NOTE — Assessment & Plan Note (Addendum)
Patient will need ischemic evaluation likely with a stress test once he is stabilized and stronger.

## 2012-09-01 NOTE — Progress Notes (Signed)
Date:  09/01/2012   ID:  Keith Duffy, DOB Jan 18, 1946, MRN [id number]  PCP:  Keith Duffy., MD  Primary Cardiologist:  Berry(New Patient)     History of Present Illness: Keith Duffy is a 67 y.o. 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 He was admitted 08/20/12 with weakness and wgt loss. He has a remote history of Non Hodgkin's Lymphoma but this had been stable. Work so far is suspicious for metastatic disease. An echo that admission shows his EF to be depressed at 40% with an AS WMA. MUGA confirmed EF of 43% but showed no WMA.  Patient was discharged on 08/28/2012.  There is a pleasant ischemic evaluation once the patient was stabilized and feeling stronger.   Patient presented as an add-on after being seen by his primary care provider. He was hypotensive with a systolic blood pressure in the 60's and 70's.  Patient feels very weak and fatigued with mild lightheadedness. He did take both his losartan and Lopressor this morning.  The patient currently denies nausea, vomiting, fever, chest pain, shortness of breath, orthopnea, dizziness, PND, cough, congestion, abdominal pain, hematochezia, melena, lower extremity edema.  Wt Readings from Last 3 Encounters:  09/01/12 161 lb (73.029 kg)  08/26/12 172 lb 4.8 oz (78.155 kg)     Past Medical History  Diagnosis Date  . Back pain   . Hypertension   . Diabetes mellitus without complication     Current Outpatient Prescriptions  Medication Sig Dispense Refill  . allopurinol (ZYLOPRIM) 300 MG tablet Take 1 tablet (300 mg total) by mouth daily.  30 tablet  0  . cyclobenzaprine (FLEXERIL) 10 MG tablet Take 10 mg by mouth 3 (three) times daily as needed for muscle spasms.      . famciclovir (FAMVIR) 500 MG tablet Take 0.5 tablets (250 mg total) by mouth daily.  30 tablet  0  . feeding supplement (GLUCERNA SHAKE) LIQD Take 237 mLs by mouth 3 (three) times daily between meals.        Marland Kitchen glucose blood test strip Use as instructed  100 each  0  . insulin aspart (NOVOLOG FLEXPEN) 100 UNIT/ML SOPN FlexPen Inject 4 Units into the skin 3 (three) times daily with meals.  5 pen  0  . insulin aspart (NOVOLOG FLEXPEN) 100 UNIT/ML SOPN FlexPen Inject 0-9 Units into the skin 3 (three) times daily with meals. CBG < 70: eat or drink something sweet & recheck blood sugar. CBG 70 - 120: 0 units  CBG 121 - 150: 1 unit  CBG 151 - 200: 2 units  CBG 201 - 250: 3 units  CBG 251 - 300: 5 units  CBG 301 - 350: 7 units  CBG 351 - 400: 9 units  CBG > 400: call MD  5 pen  0  . Insulin Glargine (LANTUS SOLOSTAR) 100 UNIT/ML SOPN Inject 20 Units into the skin every evening.      . Insulin Pen Needle 31G X 6 MM MISC Use with insulin Pen  100 each  1  . losartan (COZAAR) 50 MG tablet Take 50 mg by mouth daily.      . metoprolol tartrate (LOPRESSOR) 25 MG tablet Take 1 tablet (25 mg total) by mouth 2 (two) times daily.  60 tablet  0  . oxycodone (OXY-IR) 5 MG capsule Take 5 mg by mouth every 6 (six) hours as needed for pain.      . predniSONE (  DELTASONE) 20 MG tablet Take 4 tablets (80 mg total) by mouth daily with breakfast. Take on 7/12 & 7/13 then stop.  8 tablet  0  . sulfamethoxazole-trimethoprim (BACTRIM DS) 800-160 MG per tablet Take 1 tablet by mouth 3 (three) times a week. On Mon/Wed/Fridays.  12 tablet  0   No current facility-administered medications for this visit.    Allergies:    Allergies  Allergen Reactions  . Aspartame And Phenylalanine Diarrhea  . Codeine     Social History:  The patient  reports that he has never smoked. He does not have any smokeless tobacco history on file. He reports that he does not drink alcohol or use illicit drugs.   Family history:   Family History  Problem Relation Age of Onset  . Heart attack Father   . Heart attack Maternal Grandfather     ROS:  Please see the history of present illness.  All other systems reviewed and negative.   PHYSICAL  EXAM: VS:  BP 62/48  Pulse 115  Ht 5\' 9"  (1.753 m)  Wt 161 lb (73.029 kg)  BMI 23.76 kg/m2 Well nourished, well developed, in no acute distress HEENT: Pupils are equal round react to light accommodation extraocular movements are intact.  Neck: no JVDNo cervical lymphadenopathy. Cardiac: Regular rate and rhythm without murmurs rubs or gallops. Lungs:  clear to auscultation bilaterally, no wheezing, rhonchi or rales Abd: soft, nontender, positive bowel sounds all quadrants, no hepatosplenomegaly Ext: no lower extremity edema.  2+ radial and dorsalis pedis pulses. Skin: warm and dry Neuro:  Grossly normal  EKG:  Normal sinus rhythm QT interval of 474 ms. T waves abnormalities and one in aVL     ASSESSMENT AND PLAN:  Problem List Items Addressed This Visit   Sinus tachycardia     EKG shows a normal sinus rhythm however with a ventricular rate of 100. Does have some T-wave abnormalities in leads 1 and aVL.      Relevant Orders      EKG 12-Lead   Hypotension     Last blood pressure check was 72/60 in the right arm 72/56 and left but was as low as 62/48 in the office. Patient did take both his losartan and Lopressor this morning. He will be sent directly over to the [facility] emergency room for fluids blood pressure management.      Dilated cardiomyopathy - Primary     Patient will need ischemic evaluation likely with a stress test once he is stabilized and stronger.

## 2012-09-01 NOTE — ED Notes (Signed)
Patient transported to X-ray 

## 2012-09-01 NOTE — ED Provider Notes (Signed)
History    CSN: [id number] Arrival date & time 09/01/12  1610  First MD Initiated Contact with Patient 09/01/12 1639     Chief Complaint  Patient presents with  . Hypotension   (Consider location/radiation/quality/duration/timing/severity/associated sxs/prior Treatment) HPI Comments: Patient presents to the ED with a chief complaint of hypotension.  He states that he was recently admitted for metastatic disease. He is prior non-Hodgkin's lymphoma patient. 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 States that he was discharged home, but has been weak ever since. He denies dizziness, but states that he feels "wobbly" right when he stands up. He denies headache, chest pain, shortness of breath, nausea, vomiting, diarrhea, or constipation. Denies any blood in his stool. Denies any difficulty with urination. He states that he has had his blood pressure medication as frequently as of late.   The history is provided by the patient. No language interpreter was used.   Past Medical History  Diagnosis Date  . Back pain   . Hypertension   . Diabetes mellitus without complication    History reviewed. No pertinent past surgical history. Family History  Problem Relation Age of Onset  . Heart attack Father   . Heart attack Maternal Grandfather    History  Substance Use Topics  . Smoking status: Never Smoker   . Smokeless tobacco: Not on file  . Alcohol Use: No    Review of Systems  All other systems reviewed and are negative.    Allergies  Aspartame and phenylalanine and Codeine  Home Medications   Current Outpatient Rx  Name  Route  Sig  Dispense  Refill  . allopurinol (ZYLOPRIM) 300 MG tablet   Oral   Take 1 tablet (300 mg total) by mouth daily.   30 tablet   0   . cyclobenzaprine (FLEXERIL) 10 MG tablet   Oral   Take 10 mg by mouth 3 (three) times daily as needed for muscle spasms.         . ENSURE PLUS (ENSURE PLUS) LIQD   Oral   Take  237 mLs by mouth daily.         . famciclovir (FAMVIR) 500 MG tablet   Oral   Take 0.5 tablets (250 mg total) by mouth daily.   30 tablet   0   . insulin aspart (NOVOLOG FLEXPEN) 100 UNIT/ML SOPN FlexPen   Subcutaneous   Inject 0-9 Units into the skin 3 (three) times daily with meals. CBG < 70: eat or drink something sweet & recheck blood sugar. CBG 70 - 120: 0 units  CBG 121 - 150: 1 unit  CBG 151 - 200: 2 units  CBG 201 - 250: 3 units  CBG 251 - 300: 5 units  CBG 301 - 350: 7 units  CBG 351 - 400: 9 units  CBG > 400: call MD   5 pen   0   . Insulin Glargine (LANTUS SOLOSTAR) 100 UNIT/ML SOPN   Subcutaneous   Inject 20 Units into the skin every evening.         Marland Kitchen losartan (COZAAR) 50 MG tablet   Oral   Take 50 mg by mouth every morning.          . metoprolol tartrate (LOPRESSOR) 25 MG tablet   Oral   Take 1 tablet (25 mg total) by mouth 2 (two) times daily.   60 tablet   0   . oxyCODONE (OXY IR/ROXICODONE) 5 MG immediate release  tablet   Oral   Take 5 mg by mouth every 4 (four) hours as needed for pain.         Marland Kitchen sulfamethoxazole-trimethoprim (BACTRIM DS,SEPTRA DS) 800-160 MG per tablet   Oral   Take 1 tablet by mouth every Monday, Wednesday, and Friday.         Marland Kitchen glucose blood test strip      Use as instructed   100 each   0   . Insulin Pen Needle 31G X 6 MM MISC      Use with insulin Pen   100 each   1    BP 97/63  Pulse 102  Temp(Src) 97.5 F (36.4 C) (Oral)  Resp 9  SpO2 100% Physical Exam  Nursing note and vitals reviewed. Constitutional: He is oriented to person, place, and time. He appears well-developed and well-nourished.  HENT:  Head: Normocephalic and atraumatic.  Eyes: Conjunctivae and EOM are normal. Pupils are equal, round, and reactive to light. Right eye exhibits no discharge. Left eye exhibits no discharge. No scleral icterus.  Neck: Normal range of motion. Neck supple. No JVD present.  Cardiovascular: Normal rate,  regular rhythm, normal heart sounds and intact distal pulses.  Exam reveals no gallop and no friction rub.   No murmur heard. Pulmonary/Chest: Effort normal and breath sounds normal. No respiratory distress. He has no wheezes. He has no rales. He exhibits no tenderness.  Abdominal: Soft. Bowel sounds are normal. He exhibits no distension and no mass. There is no tenderness. There is no rebound and no guarding.  Musculoskeletal: Normal range of motion. He exhibits no edema and no tenderness.  Neurological: He is alert and oriented to person, place, and time. He has normal reflexes.  CN 3-12 intact  Skin: Skin is warm and dry.  Psychiatric: He has a normal mood and affect. His behavior is normal. Judgment and thought content normal.    ED Course  Procedures (including critical care time) Labs Reviewed  POCT I-STAT, CHEM 8 - Abnormal; Notable for the following:    Sodium 134 (*)    BUN 25 (*)    Glucose, Bld 168 (*)    Hemoglobin 11.2 (*)    HCT 33.0 (*)    All other components within normal limits  CBC WITH DIFFERENTIAL  URINALYSIS, ROUTINE W REFLEX MICROSCOPIC   Results for orders placed during the hospital encounter of 09/01/12  CBC WITH DIFFERENTIAL      Result Value Range   WBC 0.7 (*) 4.0 - 10.5 K/uL   RBC 4.14 (*) 4.22 - 5.81 MIL/uL   Hemoglobin 11.3 (*) 13.0 - 17.0 g/dL   HCT 16.1 (*) 09.6 - 04.5 %   MCV 81.2  78.0 - 100.0 fL   MCH 27.3  26.0 - 34.0 pg   MCHC 33.6  30.0 - 36.0 g/dL   RDW 40.9  81.1 - 91.4 %   Platelets 137 (*) 150 - 400 K/uL   Neutrophils Relative % 32 (*) 43 - 77 %   Lymphocytes Relative 56 (*) 12 - 46 %   Monocytes Relative 5  3 - 12 %   Eosinophils Relative 5  0 - 5 %   Basophils Relative 2 (*) 0 - 1 %   Neutro Abs 0.2 (*) 1.7 - 7.7 K/uL   Lymphs Abs 0.5 (*) 0.7 - 4.0 K/uL   Monocytes Absolute 0.0 (*) 0.1 - 1.0 K/uL   Eosinophils Absolute 0.0  0.0 - 0.7 K/uL  Basophils Absolute 0.0  0.0 - 0.1 K/uL  POCT I-STAT, CHEM 8      Result Value Range    Sodium 134 (*) 135 - 145 mEq/L   Potassium 3.8  3.5 - 5.1 mEq/L   Chloride 100  96 - 112 mEq/L   BUN 25 (*) 6 - 23 mg/dL   Creatinine, Ser 1.61  0.50 - 1.35 mg/dL   Glucose, Bld 096 (*) 70 - 99 mg/dL   Calcium, Ion 0.45  4.09 - 1.30 mmol/L   TCO2 21  0 - 100 mmol/L   Hemoglobin 11.2 (*) 13.0 - 17.0 g/dL   HCT 81.1 (*) 91.4 - 78.2 %  CG4 I-STAT (LACTIC ACID)      Result Value Range   Lactic Acid, Venous 2.12  0.5 - 2.2 mmol/L  POCT I-STAT TROPONIN I      Result Value Range   Troponin i, poc 0.01  0.00 - 0.08 ng/mL   Comment 3            Dg Chest 2 View  09/01/2012   *RADIOLOGY REPORT*  Clinical Data: Shortness of breath.  The patient is on chemotherapy.  CHEST - 2 VIEW  Comparison: 08/20/2012  Findings: Right jugular Port-A-Cath is present.  Catheter tip is in the upper SVC region.  Lungs are clear bilaterally.  No evidence for a pneumothorax.  Heart size is within normal limits and the trachea is midline.  Bony thorax is intact.  No significant pleural effusions.  IMPRESSION: No acute cardiopulmonary disease.   Original Report Authenticated By: Richarda Overlie, M.D.   Ct Chest W Contrast  08/21/2012   *RADIOLOGY REPORT*  Clinical Data:  Malignancy.  CT CHEST, ABDOMEN AND PELVIS WITH CONTRAST  Technique:  Multidetector CT imaging of the chest, abdomen and pelvis was performed following the standard protocol during bolus administration of intravenous contrast.  Contrast: OMNIPAQUE IOHEXOL 300 MG/ML  SOLN  Comparison:   None.  CT CHEST  Findings:  Nodule on the left thyroid lobe measures 23 mm.  The appearance on CT is nonspecific. There is no axillary adenopathy. No mediastinal or hilar adenopathy.  Three-vessel aortic arch. Aorta and branch vessels appear within normal limits.  Grossly, the pulmonary arteries are within normal limits.  No effusion.  The lungs demonstrate scattered areas of atelectasis.  No pulmonary mass lesion.  The central airways are patent.  IMPRESSION:  1.  23 mm left  thyroid lobe nodule. Consider further evaluation with thyroid ultrasound.  If patient is clinically hyperthyroid, consider nuclear medicine thyroid uptake and scan. 2.  No evidence of primarily density in the chest.  CT ABDOMEN AND PELVIS  Findings:  Liver:  Nonspecific 11 mm low density lesion is present in the right hepatic lobe (image number 46 series 2).  Other small areas of decreased attenuation are present in the right hepatic lobe, too small to characterize.  All of these lesions are indeterminant on today's study.  Spleen:  Normal.  Gallbladder:  Contracted.  Common bile duct:  Normal.  Pancreas:  Normal.  Adrenal glands:  Right adrenal gland normal.  The left adrenal gland appears normal however there is a nodule at the genu head is inseparable from the gland itself.  This nodule probably represents a retroperitoneal lymph node and measures 17 mm x 18 mm with surrounding inflammatory changes.  Kidneys:  Exophytic right upper pole simple renal cyst is present that measures 3 cm.  Left upper pole nonobstructing renal collecting system calculus  measures 5 mm.  Normal renal enhancement and delayed excretion.  Both ureters appear within normal limits.  Retroperitoneal adenopathy is present.  The largest lymph node measures 6 cm AP by 2 cm transverse (image number 65 series 2). Other smaller periaortic lymph nodes are present.  Stomach:  No mass lesion.  Grossly normal.  Small bowel:  Normal appearance of duodenum.  Small bowel is decompressed.  No obstruction.  There is no mesenteric adenopathy. Mesenteric vasculature appears normal.  Colon:   Normal appendix.  Colonic diverticulosis.  No discrete mass lesion is identified.  Diverticular hypertrophy of the sigmoid.  Pelvic Genitourinary:  Prostatomegaly.  Eccentrically enlarged prostate gland to the right.  Bones:  No pathologic fracture is identified.  The marrow infiltration seen on MRI is poorly appreciated on CT.  There is some heterogeneous mineralization  but no focal destructive osseous lesion.  Hip osteoarthritis is present.  No pathologic fracture is identified.  Nonspecific sclerotic lesion in the medial left iliac bone noted. Potentially this represents a bone island.  Vasculature: Atherosclerosis.  No acute vascular abnormality.  No aneurysm.  Body Wall: Within normal limits.  IMPRESSION: 1.  Retroperitoneal adenopathy.  Although nonspecific, this probably represents lymphoma. Metastatic lymphadenopathy is also possible.  Largest index node is interposed between the aorta and the left kidney measuring 6 cm x 2 cm. 2.  Indeterminant small low-density right hepatic lobe lesions. Metastatic disease is impossible to exclude. 3.  Right renal cyst. 4.  5 mm nonobstructing left upper pole renal collecting system calculus.   Original Report Authenticated By: Andreas Newport, M.D.   Mr Laqueta Jean Wo Contrast  08/24/2012   *RADIOLOGY REPORT*  Clinical Data: 67 year old male with remote history of non-Hodgkins lymphoma. Abnormal abdominal lymph node recently biopsied. Confusion. Staging.  MRI HEAD WITHOUT AND WITH CONTRAST  Technique:  Multiplanar, multiecho pulse sequences of the brain and surrounding structures were obtained according to standard protocol without and with intravenous contrast  Contrast: 15mL MULTIHANCE GADOBENATE DIMEGLUMINE 529 MG/ML IV SOLN  Comparison: None.  Findings: Cerebral volume is within normal limits for age.  No restricted diffusion to suggest acute infarction.  No midline shift, mass effect, evidence of mass lesion, ventriculomegaly, extra-axial collection or acute intracranial hemorrhage. Cervicomedullary junction and pituitary are within normal limits. Negative visualized cervical spinal cord.  Major intracranial vascular flow voids are preserved.  Left lentiform nuclei T2 and FLAIR hyperintensity compatible with small chronic lacunar infarcts.  Elsewhere, normal gray and white matter signal for age.  No abnormal enhancement of the brain.   Incidental right anterior frontal lobe developmental venous anomaly.  A punctate focus of high signal in the superior cerebellum following contrast seen only on sagittal image 14 (series 13) is felt to be vascular related.  Bone marrow signal is heterogeneous and abnormal in the clivus, the occipital condyles, and throughout the visualized cervical spine. The calvarium appears spared at this time.  No overtly destructive osseous lesion is identified.  Visualized orbit soft tissues are within normal limits.  Visualized paranasal sinuses and mastoids are clear.  Grossly normal visualized internal auditory structures.  Negative scalp soft tissues.  IMPRESSION: 1. No acute intracranial abnormality or metastatic disease to the brain identified. 2.  Abnormal bone marrow signal at the skull base and in the cervical spine is suspicious for lymphomatous involvement in this setting, but red marrow reactivation due to anemia could also have this appearance. 3. Mild for age chronic small vessel ischemia.   Original Report Authenticated By:  Erskine Speed, M.D.   Mr Thoracic Spine W Wo Contrast  08/20/2012   *RADIOLOGY REPORT*  Clinical Data:  Back pain  MRI THORACIC AND LUMBAR SPINE WITHOUT AND WITH CONTRAST  Technique:  Multiplanar and multiecho pulse sequences of the thoracic and lumbar spine were obtained without and with intravenous contrast.  Contrast: 15mL MULTIHANCE GADOBENATE DIMEGLUMINE 529 MG/ML IV SOLN  Comparison:   None.  MRI THORACIC SPINE  Findings: The vertebral bodies are normally aligned with preservation of the normal thoracic kyphosis.  There is no listhesis.  Vertebral body heights are preserved.  Signal intensity throughout the visualized vertebral body bone marrow is diffusely abnormal with a mottled T1 hypointense, T2 hyperintense appearance.  There is diffuse patchy heterogeneous enhancement seen throughout all the visualized bone marrow. Findings are most consistent with diffuse osseous metastases. No  epidural extension of tumor is seen. A single discrete lesion is difficult to measure and demarcated.  There are no epidural collections.  No significant canal or neural foraminal impingement is identified. No pathologic fracture.  Incidental note is made of an exophytic right renal cyst.   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  MRI LUMBAR SPINE  Findings: The vertebral bodies are normally aligned with preservation of the normal lumbar lordosis.  There is no listhesis. Vertebral body heights are preserved.  Signal intensity throughout the visualized vertebral body bone marrow is diffusely abnormal with a mild T1 hypointense, T2 hyperintense appearance.  There is diffuse patchy heterogeneous enhancement on post contrast sequences.  Findin similar changes are seen within the bones of the pelvis.  gs are consistent with diffuse osseous metastases.  It is difficult to measure a single discrete lesion, however, enhancement is most evident within the L3 vertebral body. No epidural tumor.  No pathologic fracture.  At L5-S1, postoperative changes from prior right hemilaminectomy are seen.  There appears to be a small amount of enhancing scar tissue along the right anterolateral aspect of the thecal sac not significant canal impingement.  There is diffuse degenerative disc bulge with disc desiccation and bilateral facet arthrosis at this level. No evident recurrent disc herniation.  There is resultant moderate bilateral foraminal narrowing.  No other significant disc disease identified within the lumbar spine.  IMPRESSION:  1.  Diffuse abnormal heterogeneous signal intensity throughout the vertebral body bone marrow with heterogeneous enhancement, worrisome for diffuse osseous metastases. 2. Postsurgical changes from prior right hemilaminectomy at L5-S1 with a small amount of enhancing scar tissue formation along the right lateral aspect of the thecal sac.  Degenerative disc disease at L5-S1  as above without recurrent focal disc herniation.  Critical Value/emergent results were called by telephone at the time of interpretation on 08/20/2012 at 10:50 p.m. to [facility], who verbally acknowledged these results.   Original Report Authenticated By: Rise Mu, M.D.   Mr Lumbar Spine W Wo Contrast  08/20/2012   *RADIOLOGY REPORT*  Clinical Data:  Back pain  MRI THORACIC AND LUMBAR SPINE WITHOUT AND WITH CONTRAST  Technique:  Multiplanar and multiecho pulse sequences of the thoracic and lumbar spine were obtained without and with intravenous contrast.  Contrast: 15mL MULTIHANCE GADOBENATE DIMEGLUMINE 529 MG/ML IV SOLN  Comparison:   None.  MRI THORACIC SPINE  Findings: The vertebral bodies are normally aligned with preservation of the normal thoracic kyphosis.  There is no listhesis.  Vertebral body heights are preserved.  Signal intensity throughout the visualized vertebral body bone marrow is diffusely abnormal with a mottled T1  hypointense, T2 hyperintense appearance.  There is diffuse patchy heterogeneous enhancement seen throughout all the visualized bone marrow. Findings are most consistent with diffuse osseous metastases. No epidural extension of tumor is seen. A single discrete lesion is difficult to measure and demarcated.  There are no epidural collections.  No significant canal or neural foraminal impingement is identified. No pathologic fracture.  Incidental note is made of an exophytic right renal cyst.   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  MRI LUMBAR SPINE  Findings: The vertebral bodies are normally aligned with preservation of the normal lumbar lordosis.  There is no listhesis. Vertebral body heights are preserved.  Signal intensity throughout the visualized vertebral body bone marrow is diffusely abnormal with a mild T1 hypointense, T2 hyperintense appearance.  There is diffuse patchy heterogeneous enhancement on post contrast sequences.   Findin similar changes are seen within the bones of the pelvis.  gs are consistent with diffuse osseous metastases.  It is difficult to measure a single discrete lesion, however, enhancement is most evident within the L3 vertebral body. No epidural tumor.  No pathologic fracture.  At L5-S1, postoperative changes from prior right hemilaminectomy are seen.  There appears to be a small amount of enhancing scar tissue along the right anterolateral aspect of the thecal sac not significant canal impingement.  There is diffuse degenerative disc bulge with disc desiccation and bilateral facet arthrosis at this level. No evident recurrent disc herniation.  There is resultant moderate bilateral foraminal narrowing.  No other significant disc disease identified within the lumbar spine.  IMPRESSION:  1.  Diffuse abnormal heterogeneous signal intensity throughout the vertebral body bone marrow with heterogeneous enhancement, worrisome for diffuse osseous metastases. 2. Postsurgical changes from prior right hemilaminectomy at L5-S1 with a small amount of enhancing scar tissue formation along the right lateral aspect of the thecal sac.  Degenerative disc disease at L5-S1 as above without recurrent focal disc herniation.  Critical Value/emergent results were called by telephone at the time of interpretation on 08/20/2012 at 10:50 p.m. to [facility], who verbally acknowledged these results.   Original Report Authenticated By: Rise Mu, M.D.   Nm Cardiac Muga Rest  08/25/2012   *RADIOLOGY REPORT*  Clinical Data: Non-Hodgkins lymphoma, pre toxic chemotherapy  NUCLEAR MEDICINE CARDIAC MULTIPLE UPTAKE GATED ACQUISITION SCAN (CARDIAC MUGA)  Technique: Radiolabeled red blood cells used to perform resting radionuclide ventriculography. Imaging performed in the anterior, LAO, and lateral projections.  Resting left ventricular ejection fraction estimated after drawing region of interest curves around the left ventricle  during systole and diastole.  Radiopharmaceutical: 25 mCi Tc-46m pertechnetate labeled autologous red cells  Comparison: None  Findings: Left ventricular ejection fraction is calculated at 43%, below the normal range. Acquisition performed at a cardiac rate of 110 beats per minute. Wall motion analysis in three projections shows no focal wall motion abnormalities.  IMPRESSION: Mildly decreased left ventricular ejection fraction of 43% without focal wall motion abnormalities.   Original Report Authenticated By: Ulyses Southward, M.D.   Ct Abdomen Pelvis W Contrast  08/21/2012   *RADIOLOGY REPORT*  Clinical Data:  Malignancy.  CT CHEST, ABDOMEN AND PELVIS WITH CONTRAST  Technique:  Multidetector CT imaging of the chest, abdomen and pelvis was performed following the standard protocol during bolus administration of intravenous contrast.  Contrast: OMNIPAQUE IOHEXOL 300 MG/ML  SOLN  Comparison:   None.  CT CHEST  Findings:  Nodule on the left thyroid lobe measures 23 mm.  The appearance on  CT is nonspecific. There is no axillary adenopathy. No mediastinal or hilar adenopathy.  Three-vessel aortic arch. Aorta and branch vessels appear within normal limits.  Grossly, the pulmonary arteries are within normal limits.  No effusion.  The lungs demonstrate scattered areas of atelectasis.  No pulmonary mass lesion.  The central airways are patent.  IMPRESSION:  1.  23 mm left thyroid lobe nodule. Consider further evaluation with thyroid ultrasound.  If patient is clinically hyperthyroid, consider nuclear medicine thyroid uptake and scan. 2.  No evidence of primarily density in the chest.  CT ABDOMEN AND PELVIS  Findings:  Liver:  Nonspecific 11 mm low density lesion is present in the right hepatic lobe (image number 46 series 2).  Other small areas of decreased attenuation are present in the right hepatic lobe, too small to characterize.  All of these lesions are indeterminant on today's study.  Spleen:  Normal.  Gallbladder:   Contracted.  Common bile duct:  Normal.  Pancreas:  Normal.  Adrenal glands:  Right adrenal gland normal.  The left adrenal gland appears normal however there is a nodule at the genu head is inseparable from the gland itself.  This nodule probably represents a retroperitoneal lymph node and measures 17 mm x 18 mm with surrounding inflammatory changes.  Kidneys:  Exophytic right upper pole simple renal cyst is present that measures 3 cm.  Left upper pole nonobstructing renal collecting system calculus measures 5 mm.  Normal renal enhancement and delayed excretion.  Both ureters appear within normal limits.  Retroperitoneal adenopathy is present.  The largest lymph node measures 6 cm AP by 2 cm transverse (image number 65 series 2). Other smaller periaortic lymph nodes are present.  Stomach:  No mass lesion.  Grossly normal.  Small bowel:  Normal appearance of duodenum.  Small bowel is decompressed.  No obstruction.  There is no mesenteric adenopathy. Mesenteric vasculature appears normal.  Colon:   Normal appendix.  Colonic diverticulosis.  No discrete mass lesion is identified.  Diverticular hypertrophy of the sigmoid.  Pelvic Genitourinary:  Prostatomegaly.  Eccentrically enlarged prostate gland to the right.  Bones:  No pathologic fracture is identified.  The marrow infiltration seen on MRI is poorly appreciated on CT.  There is some heterogeneous mineralization but no focal destructive osseous lesion.  Hip osteoarthritis is present.  No pathologic fracture is identified.  Nonspecific sclerotic lesion in the medial left iliac bone noted. Potentially this represents a bone island.  Vasculature: Atherosclerosis.  No acute vascular abnormality.  No aneurysm.  Body Wall: Within normal limits.  IMPRESSION: 1.  Retroperitoneal adenopathy.  Although nonspecific, this probably represents lymphoma. Metastatic lymphadenopathy is also possible.  Largest index node is interposed between the aorta and the left kidney  measuring 6 cm x 2 cm. 2.  Indeterminant small low-density right hepatic lobe lesions. Metastatic disease is impossible to exclude. 3.  Right renal cyst. 4.  5 mm nonobstructing left upper pole renal collecting system calculus.   Original Report Authenticated By: Andreas Newport, M.D.   Ir Fluoro Guide Cv Line Right  08/25/2012   *RADIOLOGY REPORT*  Clinical Data: History of lymphoma.  Needs Port-A-Cath for chemotherapy.  FLUOROSCOPIC AND ULTRASOUND GUIDED PLACEMENT OF A SUBCUTANEOUS PORT.  Physician: Rachelle Hora. Lowella Dandy, MD  Medications:The patient was sedated for the bone marrow biopsy that immediately proceeded this procedure. A radiology nurse monitored the patient for moderate sedation.  Ancef 2 gm.   As antibiotic prophylaxis, Ancef  was ordered pre- procedure and  administered intravenously within one hour of incision.  Moderate sedation time:25 minutes  Fluoroscopy time: 24 seconds  Procedure:  The risks of the procedure were explained to the patient.  Informed consent was obtained.  Patient was placed supine on the interventional table.  Ultrasound confirmed a patent right internal jugular vein.  The right chest and neck were cleaned with a skin antiseptic and a sterile drape was placed.  Maximal barrier sterile technique was utilized including caps, mask, sterile gowns, sterile gloves, sterile drape, hand hygiene and skin antiseptic. The right neck was anesthetized with 1% lidocaine.  Small incision was made in the right neck with a blade.  Micropuncture set was placed in the right IJ with ultrasound guidance.  The micropuncture wire was used for measurement purposes.  The right chest was anesthetized with 1% lidocaine with epinephrine.  #15 blade was used to make an incision and a subcutaneous port pocket was formed. 8 french Power Port was assembled.  Subcutaneous tunnel was formed with a stiff tunneling device.  The port catheter was brought through the subcutaneous tunnel.  The port was placed in the  subcutaneous pocket.  The micropuncture set was exchanged for a peel-away sheath.  The catheter was placed through the peel-away sheath and the tip was positioned in the SVC.  Catheter placement was confirmed with fluoroscopy.  The port was accessed and flushed with heparinized saline.  The port pocket was closed using two layers of absorbable sutures and Dermabond.  The vein skin site was closed using a single layer of absorbable suture and Dermabond. Sterile dressings were applied.  Patient tolerated the procedure well without an immediate complication.  Ultrasound and fluoroscopic images were taken and saved for this procedure.  Complications: None  Impression:  Placement of a subcutaneous port device.  The catheter tip is in the SVC and ready to be used.   Original Report Authenticated By: Richarda Overlie, M.D.   Ir US Guide Vasc Access Right  08/25/2012   *RADIOLOGY REPORT*  Clinical Data: History of lymphoma.  Needs Port-A-Cath for chemotherapy.  FLUOROSCOPIC AND ULTRASOUND GUIDED PLACEMENT OF A SUBCUTANEOUS PORT.  Physician: Rachelle Hora. Lowella Dandy, MD  Medications:The patient was sedated for the bone marrow biopsy that immediately proceeded this procedure. A radiology nurse monitored the patient for moderate sedation.  Ancef 2 gm.   As antibiotic prophylaxis, Ancef  was ordered pre- procedure and administered intravenously within one hour of incision.  Moderate sedation time:25 minutes  Fluoroscopy time: 24 seconds  Procedure:  The risks of the procedure were explained to the patient.  Informed consent was obtained.  Patient was placed supine on the interventional table.  Ultrasound confirmed a patent right internal jugular vein.  The right chest and neck were cleaned with a skin antiseptic and a sterile drape was placed.  Maximal barrier sterile technique was utilized including caps, mask, sterile gowns, sterile gloves, sterile drape, hand hygiene and skin antiseptic. The right neck was anesthetized with 1% lidocaine.   Small incision was made in the right neck with a blade.  Micropuncture set was placed in the right IJ with ultrasound guidance.  The micropuncture wire was used for measurement purposes.  The right chest was anesthetized with 1% lidocaine with epinephrine.  #15 blade was used to make an incision and a subcutaneous port pocket was formed. 8 french Power Port was assembled.  Subcutaneous tunnel was formed with a stiff tunneling device.  The port catheter was brought through the subcutaneous tunnel.  The  port was placed in the subcutaneous pocket.  The micropuncture set was exchanged for a peel-away sheath.  The catheter was placed through the peel-away sheath and the tip was positioned in the SVC.  Catheter placement was confirmed with fluoroscopy.  The port was accessed and flushed with heparinized saline.  The port pocket was closed using two layers of absorbable sutures and Dermabond.  The vein skin site was closed using a single layer of absorbable suture and Dermabond. Sterile dressings were applied.  Patient tolerated the procedure well without an immediate complication.  Ultrasound and fluoroscopic images were taken and saved for this procedure.  Complications: None  Impression:  Placement of a subcutaneous port device.  The catheter tip is in the SVC and ready to be used.   Original Report Authenticated By: Richarda Overlie, M.D.   Ct Biopsy  08/25/2012   *RADIOLOGY REPORT*  Clinical history: 67 year old with anemia and history of lymphoma.   PROCEDURE(S): CT GUIDED BONE MARROW ASPIRATES AND BIOPSY  Physician: Rachelle Hora. Henn, MD   Medications: Versed 2 mg, Fentanyl 100 mcg.  A radiology nurse monitored the patient for moderate sedation.  Sedation time:  25 minutes   Procedure: The procedure was explained to the patient. The risks and benefits of the procedure were discussed and the patient's questions were addressed.  Informed consent was obtained from the patient. The patient was placed prone on CT scan. Images of  the pelvis were obtained. The right side of back was prepped and draped in sterile fashion. The skin and right posterior iliac bone were anesthetized with 1% lidocaine.   11 gauge bone needle was directed into the right iliac bone with CT guidance.  It was very difficult to obtain aspirate material.  A total of four aspirates were obtained.  Two core biopsies were performed and one adequate core specimen was obtained.  Findings:Biopsy needle directed into the posterior right iliac bone.  Complications: None   Impression: CT guided bone marrow aspirates and core biopsy.   Original Report Authenticated By: Richarda Overlie, M.D.   Ct Biopsy  08/24/2012   *RADIOLOGY REPORT*  Clinical Data: Left retroperitoneal lymph node mass and history of non-Hodgkin's lymphoma.  CT GUIDED CORE BIOPSY OF RETROPERITONEAL MASS  Sedation:   2.0 mg IV Versed;  50 mcg IV Fentanyl  Total Moderate Sedation Time: 20 minutes.  Procedure:  The procedure risks, benefits, and alternatives were explained to the patient.  Questions regarding the procedure were encouraged and answered.  The patient understands and consents to the procedure.  The left paraspinous region was prepped with Betadine in a sterile fashion, and a sterile drape was applied covering the operative field.  A sterile gown and sterile gloves were used for the procedure.  Local anesthesia was provided with 1% Lidocaine.  CT was performed in a prone position.  Under CT guidance, a 17 gauge trocar needle was advanced from a left paraspinous approach into the retroperitoneal space.  After confirming needle tip position, coaxial 18-gauge core biopsy samples were obtained and submitted in saline.  A total of six core biopsy samples were obtained and submitted.  Complications: None  Findings: Left-sided para-aortic retroperitoneal lymph node mass measures approximately 2.6 x 5.5 cm.  Solid tissue was obtained. Postbiopsy CT imaging shows no evidence of hemorrhage or other complication  immediately following the procedure.  IMPRESSION: CT guided core biopsy performed of a left para-aortic retroperitoneal lymph node mass.   Original Report Authenticated By: Irish Lack, M.D.  Dg Chest Port 1 View  08/20/2012   *RADIOLOGY REPORT*  Clinical Data: Tachycardia  PORTABLE CHEST - 1 VIEW  Comparison: None.  Findings: Lungs are clear. No pleural effusion or pneumothorax.  The heart is top normal in size.  IMPRESSION: No evidence of acute cardiopulmonary disease.   Original Report Authenticated By: Charline Bills, M.D.     1. Neutropenia     MDM  5:29 PM Patient discussed with Dr. Jodi Mourning.  6:28 PM Bedside cardiac ultrasound performed by Dr. Jodi Mourning, no evidence of pericardial effusion. Discussed patient's labs, urinalysis is pending, and then will discuss the patient with oncology. Patient has new neutropenia. Blood pressure is stabilizing with fluids. Was also noted on the cardiac ultrasound, but the IVC was somewhat shrunken indicative of dehydration  Patient discussed with oncology, who recommends admission for neutropenia.   7:54 PM Blood cultures, IV cefepime, and 100mg  of hydrocortisone are ordered.  Discussed patient with [facility], who will admit the patient.  Roxy Horseman, PA-C 09/01/12 1955

## 2012-09-01 NOTE — Assessment & Plan Note (Addendum)
Last blood pressure check was 72/60 in the right arm 72/56 and left but was as low as 62/48 in the office. Patient did take both his losartan and Lopressor this morning. He will be sent directly over to the [facility] emergency room for fluids blood pressure management.

## 2012-09-01 NOTE — ED Notes (Signed)
Pt placed on neutropenic precautions per R. [place], [place]

## 2012-09-01 NOTE — Telephone Encounter (Signed)
Left message to call and schedule stress test

## 2012-09-01 NOTE — ED Notes (Signed)
Pt. Aware of giving a urine specimen. Pt. Unable to give specimen at this time. Nurse was notified.

## 2012-09-01 NOTE — Progress Notes (Addendum)
ANTIBIOTIC CONSULT NOTE - INITIAL  Pharmacy Consult for Cefepime/vancomycin Indication: Neutropenic precautions  Allergies  Allergen Reactions  . Aspartame And Phenylalanine Diarrhea  . Codeine     Patient Measurements: Height: 5' 8.9" (175 cm) Weight: 160 lb 15 oz (73 kg) IBW/kg (Calculated) : 70.47  Vital Signs: Temp: 99.2 F (37.3 C) (07/15 1932) Temp src: Oral (07/15 1932) BP: 113/65 mmHg (07/15 1932) Pulse Rate: 98 (07/15 1932) Intake/Output from previous day:   Intake/Output from this shift:    Labs:  Recent Labs  09/01/12 1705 09/01/12 1712  WBC 0.7*  --   HGB 11.3* 11.2*  PLT 137*  --   CREATININE  --  0.90   Estimated Creatinine Clearance: 81.6 ml/min (by C-G formula based on Cr of 0.9). No results found for this basename: VANCOTROUGH, VANCOPEAK, VANCORANDOM, GENTTROUGH, GENTPEAK, GENTRANDOM, TOBRATROUGH, TOBRAPEAK, TOBRARND, AMIKACINPEAK, AMIKACINTROU, AMIKACIN,  in the last 72 hours   Microbiology: No results found for this or any previous visit (from the past 720 hour(s)).  Medical History: Past Medical History  Diagnosis Date  . Back pain   . Hypertension   . Diabetes mellitus without complication     Medications:  Scheduled:  Infusions:  Assessment:  67 yr old male with recent admission for metastatic NHL.  Patient presents with hypotension  Patient neutropenic and IV Cefepime to be started empirically for neutropenic precautions.  CrCl ~ 81 ml/min  Goal of Therapy:  Empiric therapy for neutropenia Vancomycin 15-20  Plan:  Cefepime 2gm IV q8h Add vancomycin 750mg  iv q8hr  Maryellen Pile, PharmD 09/01/2012,7:43 PM

## 2012-09-01 NOTE — H&P (Signed)
[facility] History and Physical  Keith Duffy WUJ:[id number] DOB: 11-30-1945 DOA: 09/01/2012  Referring physician: ER physician. PCP: [name], MD  Specialists: [facility]  Chief Complaint: Low blood pressure.  HPI: Keith Duffy is a 67 y.o. male who has been recently placed on chemotherapy last week for non-Hodgkin's lymphoma was referred from cardiology office as patient was found to be hypotensive. Patient was diagnosed with nonischemic cardiomyopathy last week with EF of 40%. Patient states that over the last 2-3 days patient has been feeling wobbly and weak with chills but no fever. Today patient had followed up with his cardiologist when he was found to be hypotensive and referred to the ER. Patient otherwise denies any nausea vomiting chest pain shortness of breath. In the ER patient was found to be hypotensive and blood pressure improved with 1 L normal saline bolus. Patient's labs show pancytopenia with absolute neutrophil count less than 500. On call oncologist was consulted by ER physician and at this time they have recommended to start patient on antibiotics.   Review of Systems: As presented in the history of presenting illness, rest negative.  Past Medical History  Diagnosis Date  . Back pain   . Hypertension   . Diabetes mellitus without complication    History reviewed. No pertinent past surgical history. Social History:  reports that he has never smoked. He does not have any smokeless tobacco history on file. He reports that he does not drink alcohol or use illicit drugs. Home. where does patient live-- Can do ADLs. Can patient participate in ADLs?  Allergies  Allergen Reactions  . Aspartame And Phenylalanine Diarrhea  . Codeine     Family History  Problem Relation Age of Onset  . Heart attack Father   . Heart attack Maternal Grandfather       Prior to Admission medications   Medication Sig Start Date End Date Taking?  Authorizing Provider  allopurinol (ZYLOPRIM) 300 MG tablet Take 1 tablet (300 mg total) by mouth daily. 08/28/12  Yes Elease Etienne, MD  cyclobenzaprine (FLEXERIL) 10 MG tablet Take 10 mg by mouth 3 (three) times daily as needed for muscle spasms.   Yes Historical Provider, MD  ENSURE PLUS (ENSURE PLUS) LIQD Take 237 mLs by mouth daily.   Yes Historical Provider, MD  famciclovir (FAMVIR) 500 MG tablet Take 0.5 tablets (250 mg total) by mouth daily. 08/28/12  Yes Elease Etienne, MD  insulin aspart (NOVOLOG FLEXPEN) 100 UNIT/ML SOPN FlexPen Inject 0-9 Units into the skin 3 (three) times daily with meals. CBG < 70: eat or drink something sweet & recheck blood sugar. CBG 70 - 120: 0 units  CBG 121 - 150: 1 unit  CBG 151 - 200: 2 units  CBG 201 - 250: 3 units  CBG 251 - 300: 5 units  CBG 301 - 350: 7 units  CBG 351 - 400: 9 units  CBG > 400: call MD 08/28/12  Yes Elease Etienne, MD  Insulin Glargine (LANTUS SOLOSTAR) 100 UNIT/ML SOPN Inject 20 Units into the skin every evening.   Yes Historical Provider, MD  losartan (COZAAR) 50 MG tablet Take 50 mg by mouth every morning.    Yes Historical Provider, MD  metoprolol tartrate (LOPRESSOR) 25 MG tablet Take 1 tablet (25 mg total) by mouth 2 (two) times daily. 08/28/12  Yes Elease Etienne, MD  oxyCODONE (OXY IR/ROXICODONE) 5 MG immediate release tablet Take 5 mg by mouth every 4 (four) hours  as needed for pain.   Yes Historical Provider, MD  sulfamethoxazole-trimethoprim (BACTRIM DS,SEPTRA DS) 800-160 MG per tablet Take 1 tablet by mouth every Monday, Wednesday, and Friday.   Yes Historical Provider, MD  glucose blood test strip Use as instructed 08/28/12   Elease Etienne, MD  Insulin Pen Needle 31G X 6 MM MISC Use with insulin Pen 08/28/12   Elease Etienne, MD   Physical Exam: Filed Vitals:   09/01/12 1830 09/01/12 1900 09/01/12 1932 09/01/12 1937  BP: 134/69 125/68 113/65   Pulse: 92 98 98   Temp:   99.2 F (37.3 C)   TempSrc:   Oral    Resp: 13 9 17    Height:    5' 8.9" (1.75 m)  Weight:    73 kg (160 lb 15 oz)  SpO2: 100% 99% 99%      General:  Well-developed and nourished.  Eyes: Anicteric no pallor.  ENT: No discharge from ears eyes nose mouth.  Neck: No mass felt.  Cardiovascular: S1-S2 heard.  Respiratory: No rhonchi or crepitations.  Abdomen: Soft nontender bowel sounds present.  Skin: No rash.  Musculoskeletal: No edema.  Psychiatric: Appears normal.  Neurologic: Alert awake oriented to time place and person. Moves all extremities.  Labs on Admission:  Basic Metabolic Panel:  Recent Labs Lab 08/27/12 0540 08/28/12 0555 09/01/12 1712  NA 139 135 134*  K 3.8 3.6 3.8  CL 104 101 100  CO2 24 25  --   GLUCOSE 157* 99 168*  BUN 30* 28* 25*  CREATININE 0.82 0.80 0.90  CALCIUM 8.4 8.6  --    Liver Function Tests:  Recent Labs Lab 08/28/12 0555  AST 92*  ALT 151*  ALKPHOS 327*  BILITOT 0.3  PROT 5.4*  ALBUMIN 1.8*   No results found for this basename: LIPASE, AMYLASE,  in the last 168 hours No results found for this basename: AMMONIA,  in the last 168 hours CBC:  Recent Labs Lab 08/27/12 0540 08/28/12 0555 09/01/12 1705 09/01/12 1712  WBC 7.8 12.4* 0.7*  --   NEUTROABS  --   --  0.2*  --   HGB 8.0* 9.7* 11.3* 11.2*  HCT 24.6* 29.4* 33.6* 33.0*  MCV 82.3 82.1 81.2  --   PLT 202 156 137*  --    Cardiac Enzymes: No results found for this basename: CKTOTAL, CKMB, CKMBINDEX, TROPONINI,  in the last 168 hours  BNP (last 3 results) No results found for this basename: PROBNP,  in the last 8760 hours CBG:  Recent Labs Lab 08/27/12 1714 08/27/12 2207 08/28/12 0757 08/28/12 1151 09/01/12 2020  GLUCAP 261* 189* 89 348* 114*    Radiological Exams on Admission: Dg Chest 2 View  09/01/2012   *RADIOLOGY REPORT*  Clinical Data: Shortness of breath.  The patient is on chemotherapy.  CHEST - 2 VIEW  Comparison: 08/20/2012  Findings: Right jugular Port-A-Cath is present.   Catheter tip is in the upper SVC region.  Lungs are clear bilaterally.  No evidence for a pneumothorax.  Heart size is within normal limits and the trachea is midline.  Bony thorax is intact.  No significant pleural effusions.  IMPRESSION: No acute cardiopulmonary disease.   Original Report Authenticated By: Richarda Overlie, M.D.     Assessment/Plan Principal Problem:   Hypotension Active Problems:   Dilated cardiomyopathy   NHL (non-Hodgkin's lymphoma)   Pancytopenia   Diabetes mellitus   1. Hypotension - cause is not clear. Patient does not look  septic. Blood cultures has been ordered. Patient has been started on empiric antibiotics as patient is also pancytopenic with absolute neutrophil count less than 500. Patient was recently on large doses of steroids for his chemotherapy and presently on prednisone last dose which patient today. I'm not sure if patient has insufficiency. Check cortisol level and one dose of hydrocortisone 100 mg IV has been ordered in the ER. Closely follow blood pressure trends in the telemetry. Continue gentle hydration for another few hours given patient's history of cardiomyopathy caution not to overload. 2. Pancytopenia with absolute neutrophil count less than 500 - patient is mildly febrile. Blood cultures have been sent. At this time since patient is also mildly hypotensive on presentation in addition to cefepime vancomycin has been ordered. Closely follow CBC with differentials. 3. Nonischemic cardiomyopathy - see #1 and 2. 4. Diabetes mellitus type 2 - continue home medication with close followup of sliding-scale coverage.    Code Status: Full code.  Family Communication: Patient's family at the bedside.  Disposition Plan: Admit to inpatient.    Othelia Riederer N. [facility] Pager [phone].  If 7PM-7AM, please contact night-coverage [email or web address] Password [facility] 09/01/2012, 8:31 PM

## 2012-09-02 ENCOUNTER — Other Ambulatory Visit: Payer: Self-pay | Admitting: Medical

## 2012-09-02 DIAGNOSIS — D709 Neutropenia, unspecified: Secondary | ICD-10-CM

## 2012-09-02 LAB — COMPREHENSIVE METABOLIC PANEL
Alkaline Phosphatase: 191 U/L — ABNORMAL HIGH (ref 39–117)
BUN: 25 mg/dL — ABNORMAL HIGH (ref 6–23)
Chloride: 101 mEq/L (ref 96–112)
GFR calc Af Amer: >90 mL/min (ref >90)
GFR calc non Af Amer: >90 mL/min (ref >90)
Glucose, Bld: 228 mg/dL — ABNORMAL HIGH (ref 70–99)
Potassium: 3.8 mEq/L (ref 3.5–5.1)
Total Bilirubin: 0.3 mg/dL (ref 0.3–1.2)

## 2012-09-02 LAB — DIFFERENTIAL
Basophils Relative: 0 % (ref 0–1)
Eosinophils Relative: 3 % (ref 0–5)
Lymphs Abs: 0.1 10*3/uL — ABNORMAL LOW (ref 0.7–4.0)
Monocytes Relative: 6 % (ref 3–12)
Neutro Abs: 0.2 10*3/uL — ABNORMAL LOW (ref 1.7–7.7)

## 2012-09-02 LAB — CBC
MCH: 27.3 pg (ref 26.0–34.0)
MCHC: 34.1 g/dL (ref 30.0–36.0)
Platelets: 100 10*3/uL — ABNORMAL LOW (ref 150–400)
RBC: 3.48 MIL/uL — ABNORMAL LOW (ref 4.22–5.81)

## 2012-09-02 LAB — CBC WITH DIFFERENTIAL/PLATELET

## 2012-09-02 LAB — CREATININE, SERUM: Creatinine, Ser: 0.77 mg/dL (ref 0.50–1.35)

## 2012-09-02 LAB — GLUCOSE, CAPILLARY
Glucose-Capillary: 173 mg/dL — ABNORMAL HIGH (ref 70–99)
Glucose-Capillary: 292 mg/dL — ABNORMAL HIGH (ref 70–99)
Glucose-Capillary: 192 mg/dL — ABNORMAL HIGH (ref 70–99)

## 2012-09-02 LAB — CORTISOL: Cortisol, Plasma: 47 ug/dL

## 2012-09-02 MED ORDER — VANCOMYCIN HCL IN DEXTROSE 750-5 MG/150ML-% IV SOLN
750.0000 mg | Freq: Three times a day (TID) | INTRAVENOUS | Status: DC
  Administered 2012-09-02 – 2012-09-04 (×7): 750 mg via INTRAVENOUS
  Filled 2012-09-02 (×9): qty 150

## 2012-09-02 MED ORDER — FILGRASTIM 480 MCG/1.6ML IJ SOLN
480.0000 ug | Freq: Every day | INTRAMUSCULAR | Status: DC
  Administered 2012-09-02 – 2012-09-04 (×3): 480 ug via SUBCUTANEOUS
  Filled 2012-09-02 (×4): qty 1.6

## 2012-09-02 MED ORDER — FLUCONAZOLE 100 MG PO TABS
100.0000 mg | ORAL_TABLET | Freq: Every day | ORAL | Status: DC
  Administered 2012-09-02 – 2012-09-05 (×4): 100 mg via ORAL
  Filled 2012-09-02 (×4): qty 1

## 2012-09-02 MED ORDER — GLUCERNA SHAKE PO LIQD
237.0000 mL | ORAL | Status: DC
  Administered 2012-09-04: 237 mL via ORAL
  Filled 2012-09-02 (×4): qty 237

## 2012-09-02 MED ORDER — HYDROCORTISONE SOD SUCCINATE 100 MG IJ SOLR
50.0000 mg | Freq: Two times a day (BID) | INTRAMUSCULAR | Status: DC
  Administered 2012-09-02: 50 mg via INTRAVENOUS
  Administered 2012-09-03: 13:00:00 via INTRAVENOUS
  Administered 2012-09-03: 50 mg via INTRAVENOUS
  Filled 2012-09-02 (×4): qty 1

## 2012-09-02 NOTE — Progress Notes (Addendum)
[facility] PROGRESS NOTE  Keith Duffy ZOX:[id number] DOB: 1945/08/10 DOA: 09/01/2012 PCP: [name], MD  Assessment/Plan: Principal Problem:   Hypotension Active Problems:   Dilated cardiomyopathy   NHL (non-Hodgkin's lymphoma)   Pancytopenia   Diabetes mellitus    1. Hypotension: Patient presented with BP of 97/63. Etiology is unclear, although he did have a high BUN/Creatinine rAtio, suggestive of dehydration and volume depletion. but patient had no chest pain or SOB, O2 saturations were normal, and he did not look septic, clinically. Managed with aggressive iv fluids, and this AM, BP has normalized. Blood cultures are pending, CXR is devoid of acute findings, cardiac enzymes were unelevated and urinalysis is negative. Patient has been on steroid therapy, so may have some adrenal insufficency.Will continue stress dose of steroids. Antihypertensives are on hold.  2. Non-Hodgkin Lymphoma/Pancytopenia: Wcc was 0.3, ANC 0.2, HB 9.5 and Plts 100 at presentation. This is likely due to chemotherapy for NHL, currently under care of Dr [name]. He did have a low grade temp of 99.2. As described above, septic work up is in progress, but no obvious foci of infection has been elicited so far. On Cefepime/Vancomycin, day# 2. Following CBC/Diff. Neupogen may be needed. Dr [name] has been consulted. 3. Oral Thrush: This was an incidental finding on physical examination. Commenced on a 7-day course of oral Diflucan today.  4. Cardiomyopathy: Patient has known cardiomyopathy, under care of Dr [name]. EF 40% per 2D Echocardiogram. MUGA revealed EF of 43% but showed no WMA. The plan is for ischemic evaluation via stress testing, when medically stable. Currently, he has no evidence of CHF decompensation. Will need to monitor volume status closely. 5. Diabetes mellitus type 2: This is insulin-requiring. Control is suspect, given random blood glucose of 228 at presentation.  Continued on pre-admission treatment regimen, and SSI, adjusted as indicated. Will check HBA1C.   Code Status: Full Code.  Family Communication:  Disposition Plan: To be determined.    Brief narrative: 67 y.o. male with history of chronic back pain, HTN, DM-2, on chemotherapy since last week for non-Hodgkin's lymphoma, recently diagnosed with nonischemic cardiomyopathy, EF of 40%, referred from cardiology office as patient was found to be hypotensive. Patient states that over the last 2-3 days he has been feeling wobbly and weak with chills, but no fever. On 09/01/12, patient was seen at his cardiologists officer on follow up, when he was found to be hypotensive and referred to the ER. He had no nausea vomiting chest pain or shortness of breath. In the ER patient was found to be hypotensive and blood pressure improved with 1 L normal saline bolus. Labs showed pancytopenia with absolute neutrophil count less than 500. Admitted for further management.    Consultants:  N/A.   Procedures:  CXR.   Antibiotics:  Cefepime 09/01/12>>>  Vancomycin 09/01/12>>>  HPI/Subjective: No new issues.  Objective: Vital signs in last 24 hours: Temp:  [97.5 F (36.4 C)-99.2 F (37.3 C)] 97.8 F (36.6 C) (07/16 0514) Pulse Rate:  [82-115] 90 (07/16 0514) Resp:  [9-20] 18 (07/16 0514) BP: (62-139)/(48-81) 139/81 mmHg (07/16 0514) SpO2:  [99 %-100 %] 99 % (07/16 0514) Weight:  [73 kg (160 lb 15 oz)-73.029 kg (161 lb)] 73 kg (160 lb 15 oz) (07/15 1937) Weight change:  Last BM Date: 09/01/12  Intake/Output from previous day: 07/15 0701 - 07/16 0700 In: 511.3 [P.O.:120; I.V.:141.3; IV Piggyback:250] Out: 425 [Urine:425] Total I/O In: 75 [I.V.:75] Out: -    Physical  Exam: General: Comfortable, alert, communicative, fully oriented, not short of breath at rest.  HEENT:  Mild clinical pallor, no jaundice, no conjunctival injection or discharge. Hydration is fair. Patient has oral thrush.  NECK:   Supple, JVP not seen, no carotid bruits, no palpable lymphadenopathy, no palpable goiter. CHEST:  Clinically clear to auscultation, no wheezes, no crackles. HEART:  Sounds 1 and 2 heard, normal, regular, no murmurs. ABDOMEN:  Full, soft, non-tender, no palpable organomegaly, no palpable masses, normal bowel sounds. GENITALIA:  Not examined. LOWER EXTREMITIES:  No pitting edema, palpable peripheral pulses. MUSCULOSKELETAL SYSTEM:  Unremarkable. CENTRAL NERVOUS SYSTEM:  No focal neurologic deficit on gross examination.  Lab Results:  Recent Labs  09/01/12 2330 09/02/12 0500  WBC 0.3* DUPLICATE SEE [id number] OK WITH CHRISTY TW  HGB 9.5* DUPLICATE SEE [id number] OK WITH CHRISTY TW  HCT 27.9* DUPLICATE SEE [id number] OK WITH CHRISTY TW  PLT 100* DUPLICATE SEE [id number] OK WITH CHRISTY TW    Recent Labs  09/01/12 1712 09/01/12 2330 09/02/12 0252  NA 134*  --  134*  K 3.8  --  3.8  CL 100  --  101  CO2  --   --  22  GLUCOSE 168*  --  228*  BUN 25*  --  25*  CREATININE 0.90 0.77 0.74  CALCIUM  --   --  8.1*   No results found for this or any previous visit (from the past 240 hour(s)).   Studies/Results: Dg Chest 2 View  09/01/2012   *RADIOLOGY REPORT*  Clinical Data: Shortness of breath.  The patient is on chemotherapy.  CHEST - 2 VIEW  Comparison: 08/20/2012  Findings: Right jugular Port-A-Cath is present.  Catheter tip is in the upper SVC region.  Lungs are clear bilaterally.  No evidence for a pneumothorax.  Heart size is within normal limits and the trachea is midline.  Bony thorax is intact.  No significant pleural effusions.  IMPRESSION: No acute cardiopulmonary disease.   Original Report Authenticated By: Richarda Overlie, M.D.    Medications: Scheduled Meds: . allopurinol  300 mg Oral Daily  . ceFEPime (MAXIPIME) IV  2 g Intravenous Q8H  . enoxaparin (LOVENOX) injection  40 mg Subcutaneous Q24H  . famciclovir  250 mg Oral Daily  . feeding supplement  237 mL Oral Daily  . insulin aspart   0-9 Units Subcutaneous TID WC  . insulin glargine  20 Units Subcutaneous QPM  . sodium chloride  3 mL Intravenous Q12H  . sulfamethoxazole-trimethoprim  1 tablet Oral Q M,W,F  . vancomycin  750 mg Intravenous Q8H   Continuous Infusions: . sodium chloride 75 mL/hr at 09/01/12 2137   PRN Meds:.acetaminophen, acetaminophen, cyclobenzaprine, ondansetron (ZOFRAN) IV, ondansetron, oxyCODONE, sodium chloride    LOS: 1 day   Minetta Krisher,CHRISTOPHER  [facility] Pager [phone]. If 8PM-8AM, please contact night-coverage at [email or web address], password [facility] 09/02/2012, 8:14 AM  LOS: 1 day

## 2012-09-02 NOTE — ED Provider Notes (Signed)
Medical screening examination/treatment/procedure(s) were conducted as a shared visit with non-physician practitioner(s) or resident  and myself.  I personally evaluated the patient during the encounter and agree with the findings and plan unless otherwise indicated.  Hypotensive and neutropenia.  No source of infection on exam or work up however with patient being high risk/immunosuppressed and low bp --cultures and cefepime abx. Consultants discussed case with PA.  Rechecked multiple times, improved significantly in ED.   CRITICAL CARE Performed by: Enid Skeens   Total critical care time: 35 min  Critical care time was exclusive of separately billable procedures and treating other patients.  Critical care was necessary to treat or prevent imminent or life-threatening deterioration.  Critical care was time spent personally by me on the following activities: development of treatment plan with patient and/or surrogate as well as nursing, discussions with consultants, evaluation of patient's response to treatment, examination of patient, obtaining history from patient or surrogate, ordering and performing treatments and interventions, ordering and review of laboratory studies, ordering and review of radiographic studies, pulse oximetry and re-evaluation of patient's condition.   Enid Skeens, MD 09/02/12 1723

## 2012-09-02 NOTE — Progress Notes (Signed)
INITIAL NUTRITION ASSESSMENT  DOCUMENTATION CODES Per approved criteria  -Severe malnutrition in the context of chronic illness  Pt meets criteria for SEVERE MALNUTRITION in the context of Chronic Illness as evidenced by 24% wt loss in less than 2 months and PO intake <75% of estimated energy requirements for >1 month based on pt's report.  INTERVENTION: Diet liberalized to Regular Continue Ensure Complete once daily Provide Glucerna once daily Provide Magic Cup once daily  NUTRITION DIAGNOSIS: Inadequate oral intake related to poor appetite as evidenced by pt's report of eating 50% of meals and 7% wt loss in less than 1 week.   Goal: Pt to meet >/= 90% of their estimated nutrition needs  Monitor:  PO intake Weight Labs  Reason for Assessment: Malnutrition Screening Tool, score of 3  67 y.o. male  Admitting Dx: Hypotension  ASSESSMENT: 67 y.o. male who has been recently placed on chemotherapy last week for non-Hodgkin's lymphoma was referred from cardiology office as patient was found to be hypotensive. Patient was diagnosed with nonischemic cardiomyopathy last week with EF of 40%. Patient states that over the last 2-3 days patient has been feeling wobbly and weak with chills but no fever. Today patient had followed up with his cardiologist when he was found to be hypotensive and referred to the ER.  Pt reports that his appetite improved slightly at home but, continues to be fair and PO intake continues to be poor. He reports that he is only eating about 50% of meals and that he didn't like his lunch. Pt has been drinking Ensure at home 1-2 times per day; pt has lost 12 lbs in the past week (7% wt loss). Wife states that when she ordered pt breakfast he was refused most items due to being on Heart Healthy diet; diet now changed to regular. Pt had been seen by RD at previous admission pt reported losing 40 lbs in 6 weeks, usual weight of 210 lbs, and poor po intake for 6 weeks.    Height: Ht Readings from Last 1 Encounters:  09/01/12 5' 8.9" (1.75 m)    Weight: Wt Readings from Last 1 Encounters:  09/01/12 160 lb 15 oz (73 kg)    Ideal Body Weight: 160 lbs  % Ideal Body Weight: 100%  Wt Readings from Last 10 Encounters:  09/01/12 160 lb 15 oz (73 kg)  09/01/12 161 lb (73.029 kg)  08/26/12 172 lb 4.8 oz (78.155 kg)    Usual Body Weight: 210 lbs  % Usual Body Weight: 76%  BMI:  Body mass index is 23.84 kg/(m^2).  Estimated Nutritional Needs: Kcal: 2400-2550 Protein: 90-110 grams Fluid: 2.3 L  Skin: WDL  Diet Order: General  EDUCATION NEEDS: -No education needs identified at this time   Intake/Output Summary (Last 24 hours) at 09/02/12 1528 Last data filed at 09/02/12 1446  Gross per 24 hour  Intake 1116.25 ml  Output   1226 ml  Net -109.75 ml    Last BM: 7/15  Labs:   Recent Labs Lab 08/27/12 0540 08/28/12 0555 09/01/12 1712 09/01/12 2330 09/02/12 0252  NA 139 135 134*  --  134*  K 3.8 3.6 3.8  --  3.8  CL 104 101 100  --  101  CO2 24 25  --   --  22  BUN 30* 28* 25*  --  25*  CREATININE 0.82 0.80 0.90 0.77 0.74  CALCIUM 8.4 8.6  --   --  8.1*  GLUCOSE 157* 99 168*  --  228*    CBG (last 3)   Recent Labs  09/01/12 2237 09/02/12 0740 09/02/12 1217  GLUCAP 286* 132* 173*    Scheduled Meds: . allopurinol  300 mg Oral Daily  . ceFEPime (MAXIPIME) IV  2 g Intravenous Q8H  . enoxaparin (LOVENOX) injection  40 mg Subcutaneous Q24H  . famciclovir  250 mg Oral Daily  . feeding supplement  237 mL Oral Daily  . fluconazole  100 mg Oral Daily  . hydrocortisone sod succinate (SOLU-CORTEF) inj  50 mg Intravenous Q12H  . insulin aspart  0-9 Units Subcutaneous TID WC  . insulin glargine  20 Units Subcutaneous QPM  . sodium chloride  3 mL Intravenous Q12H  . sulfamethoxazole-trimethoprim  1 tablet Oral Q M,W,F  . vancomycin  750 mg Intravenous Q8H    Continuous Infusions:   Past Medical History  Diagnosis Date   . Back pain   . Hypertension   . Diabetes mellitus without complication     History reviewed. No pertinent past surgical history.  Ian Malkin RD, LDN Inpatient Clinical Dietitian Pager: [phone] After Hours Pager: [phone]

## 2012-09-03 ENCOUNTER — Telehealth: Payer: Self-pay | Admitting: Hematology & Oncology

## 2012-09-03 LAB — CBC WITH DIFFERENTIAL/PLATELET
Basophils Relative: 1 % (ref 0–1)
Eosinophils Absolute: 0 10*3/uL (ref 0.0–0.7)
Hemoglobin: 9.5 g/dL — ABNORMAL LOW (ref 13.0–17.0)
Lymphocytes Relative: 50 % — ABNORMAL HIGH (ref 12–46)
MCHC: 33.5 g/dL (ref 30.0–36.0)
Monocytes Relative: 3 % (ref 3–12)
Neutrophils Relative %: 44 % (ref 43–77)
RBC: 3.51 MIL/uL — ABNORMAL LOW (ref 4.22–5.81)
WBC: 0.9 10*3/uL — CL (ref 4.0–10.5)

## 2012-09-03 LAB — VANCOMYCIN, TROUGH: Vancomycin Tr: 16.6 ug/mL (ref 10.0–20.0)

## 2012-09-03 LAB — BASIC METABOLIC PANEL
CO2: 23 mEq/L (ref 19–32)
Chloride: 103 mEq/L (ref 96–112)
Creatinine, Ser: 0.65 mg/dL (ref 0.50–1.35)
GFR calc Af Amer: >90 mL/min (ref >90)
Potassium: 3.9 mEq/L (ref 3.5–5.1)
Sodium: 135 mEq/L (ref 135–145)

## 2012-09-03 LAB — GLUCOSE, CAPILLARY
Glucose-Capillary: 154 mg/dL — ABNORMAL HIGH (ref 70–99)
Glucose-Capillary: 239 mg/dL — ABNORMAL HIGH (ref 70–99)

## 2012-09-03 MED ORDER — INSULIN GLARGINE 100 UNIT/ML ~~LOC~~ SOLN
25.0000 [IU] | Freq: Every evening | SUBCUTANEOUS | Status: DC
  Administered 2012-09-04: 25 [IU] via SUBCUTANEOUS
  Filled 2012-09-03 (×2): qty 0.25

## 2012-09-03 MED ORDER — INSULIN ASPART 100 UNIT/ML ~~LOC~~ SOLN
0.0000 [IU] | Freq: Three times a day (TID) | SUBCUTANEOUS | Status: DC
  Administered 2012-09-04: 3 [IU] via SUBCUTANEOUS
  Administered 2012-09-04: 5 [IU] via SUBCUTANEOUS
  Administered 2012-09-04: 2 [IU] via SUBCUTANEOUS

## 2012-09-03 MED ORDER — METOPROLOL TARTRATE 25 MG PO TABS
25.0000 mg | ORAL_TABLET | Freq: Two times a day (BID) | ORAL | Status: DC
  Administered 2012-09-03 – 2012-09-05 (×4): 25 mg via ORAL
  Filled 2012-09-03 (×5): qty 1

## 2012-09-03 NOTE — Progress Notes (Addendum)
[facility] PROGRESS NOTE  Joedy Eickhoff ZOX:[id number] DOB: [birth year] DOA: 09/01/2012 PCP: [name], MD  Assessment/Plan: Principal Problem:   Hypotension Active Problems:   Dilated cardiomyopathy   NHL (non-Hodgkin's lymphoma)   Pancytopenia   Diabetes mellitus    1. Hypotension: Patient presented with BP of 97/63. Etiology is unclear, although he did have a high BUN/Creatinine ratio, suggestive of dehydration and volume depletion. but patient had no chest pain or SOB, O2 saturations were normal, and he did not look septic, clinically. Managed with aggressive iv fluids, and as of AM 09/02/12, BP had normalized. Blood cultures are negative so far, CXR is devoid of acute findings, cardiac enzymes were unelevated and urinalysis is negative. Patient had been on steroid therapy, so may have had some adrenal insufficency.Was initially administered stress doses of steroids, but as serum cortisol level was 47.0, this was discontinued. IV fluids were discontinued on 09/02/12, without deleterious effect. As BP is creeping up today, have restarted beta-blocker.  2. Non-Hodgkin Lymphoma/Pancytopenia: Wcc was 0.3, ANC 0.2, HB 9.5 and Plts 100 at presentation. This is likely due to chemotherapy for NHL, currently under care of Dr [name]. He did have a low grade temp of 99.2. As described above, septic work up is in progress, but no obvious foci of infection has been elicited so far. On Cefepime/Vancomycin, day# 3. Following CBC/Diff. Neupogen was started on 09/02/12, and [facility] is improving already. Dr [name] provided oncology consultation. 3. Oral Thrush: This was an incidental finding on physical examination. Now on day# 2 of a 7-day course of Diflucan.  4. Cardiomyopathy: Patient has known cardiomyopathy, under care of Dr [name]. EF 40% per 2D Echocardiogram. MUGA revealed EF of 43% but showed no WMA. The plan is for ischemic evaluation via stress testing, when medically stable.  Currently, he has no evidence of CHF decompensation. Will need to monitor volume status closely. 5. Diabetes mellitus type 2: This is insulin-requiring. Control is suspect, given random blood glucose of 228 at presentation. Continued on pre-admission treatment regimen, and SSI, adjusted as indicated. HBA1C is pending.   Code Status: Full Code.  Family Communication:  Disposition Plan: To be determined.    Brief narrative: 67 y.o. male with history of chronic back pain, HTN, DM-2, on chemotherapy since last week for non-Hodgkin's lymphoma, recently diagnosed with nonischemic cardiomyopathy, EF of 40%, referred from cardiology office as patient was found to be hypotensive. Patient states that over the last 2-3 days he has been feeling wobbly and weak with chills, but no fever. On 09/01/12, patient was seen at his cardiologists officer on follow up, when he was found to be hypotensive and referred to the ER. He had no nausea vomiting chest pain or shortness of breath. In the ER patient was found to be hypotensive and blood pressure improved with 1 L normal saline bolus. Labs showed pancytopenia with absolute neutrophil count less than 500. Admitted for further management.    Consultants:  N/A.   Procedures:  CXR.   Antibiotics:  Cefepime 09/01/12>>>  Vancomycin 09/01/12>>>  HPI/Subjective: No new issues.  Objective: Vital signs in last 24 hours: Temp:  [98.6 F (37 C)-99 F (37.2 C)] 99 F (37.2 C) (07/17 1510) Pulse Rate:  [80-107] 107 (07/17 1510) Resp:  [12-18] 18 (07/17 1510) BP: (141-157)/(73-76) 141/76 mmHg (07/17 1510) SpO2:  [100 %] 100 % (07/17 1510) Weight:  [72.576 kg (160 lb)] 72.576 kg (160 lb) (07/17 0720) Weight change:  Last BM Date: 09/02/12  Intake/Output  from previous day: 07/16 0701 - 07/17 0700 In: 1635 [P.O.:960; I.V.:75; IV Piggyback:600] Out: 1601 [Urine:1600; Stool:1] Total I/O In: 620 [P.O.:420; IV Piggyback:200] Out: 800 [Urine:800]   Physical  Exam: General: Comfortable, alert, communicative, fully oriented, not short of breath at rest.  HEENT:  Mild clinical pallor, no jaundice, no conjunctival injection or discharge. Hydration is fair. Oral thrush has improved.  NECK:  Supple, JVP not seen, no carotid bruits, no palpable lymphadenopathy, no palpable goiter. CHEST:  Clinically clear to auscultation, no wheezes, no crackles. HEART:  Sounds 1 and 2 heard, normal, regular, no murmurs. ABDOMEN:  Full, soft, non-tender, no palpable organomegaly, no palpable masses, normal bowel sounds. GENITALIA:  Not examined. LOWER EXTREMITIES:  No pitting edema, palpable peripheral pulses. MUSCULOSKELETAL SYSTEM:  Unremarkable. CENTRAL NERVOUS SYSTEM:  No focal neurologic deficit on gross examination.  Lab Results:  Recent Labs  09/02/12 0500 09/03/12 1010  WBC DUPLICATE SEE [id number] OK WITH CHRISTY TW 0.9*  HGB DUPLICATE SEE [id number] OK WITH CHRISTY TW 9.5*  HCT DUPLICATE SEE [id number] OK WITH CHRISTY TW 28.4*  PLT DUPLICATE SEE [id number] OK WITH CHRISTY TW 105*    Recent Labs  09/02/12 0252 09/03/12 0525  NA 134* 135  K 3.8 3.9  CL 101 103  CO2 22 23  GLUCOSE 228* 177*  BUN 25* 16  CREATININE 0.74 0.65  CALCIUM 8.1* 8.4   Recent Results (from the past 240 hour(s))  CULTURE, BLOOD (ROUTINE X 2)     Status: None   Collection Time    09/01/12  7:55 PM      Result Value Range Status   Specimen Description BLOOD RIGHT ARM   Final   Special Requests BOTTLES DRAWN AEROBIC ONLY 3CC   Final   Culture  Setup Time 09/02/2012 02:19   Final   Culture     Final   Value:        BLOOD CULTURE RECEIVED NO GROWTH TO DATE CULTURE WILL BE HELD FOR 5 DAYS BEFORE ISSUING A FINAL NEGATIVE REPORT   Report Status PENDING   Incomplete  CULTURE, BLOOD (ROUTINE X 2)     Status: None   Collection Time    09/01/12  8:00 PM      Result Value Range Status   Specimen Description BLOOD RIGHT HAND   Final   Special Requests BOTTLES DRAWN AEROBIC AND ANAEROBIC 4CC    Final   Culture  Setup Time 09/02/2012 03:02   Final   Culture     Final   Value:        BLOOD CULTURE RECEIVED NO GROWTH TO DATE CULTURE WILL BE HELD FOR 5 DAYS BEFORE ISSUING A FINAL NEGATIVE REPORT   Report Status PENDING   Incomplete     Studies/Results: No results found.  Medications: Scheduled Meds: . ceFEPime (MAXIPIME) IV  2 g Intravenous Q8H  . enoxaparin (LOVENOX) injection  40 mg Subcutaneous Q24H  . famciclovir  250 mg Oral Daily  . feeding supplement  237 mL Oral Daily  . feeding supplement  237 mL Oral Q24H  . filgrastim  480 mcg Subcutaneous Daily  . fluconazole  100 mg Oral Daily  . insulin aspart  0-9 Units Subcutaneous TID WC  . insulin glargine  20 Units Subcutaneous QPM  . sodium chloride  3 mL Intravenous Q12H  . vancomycin  750 mg Intravenous Q8H   Continuous Infusions:   PRN Meds:.acetaminophen, acetaminophen, cyclobenzaprine, ondansetron (ZOFRAN) IV, ondansetron, oxyCODONE, sodium chloride    LOS:  2 days   Roxi Hlavaty,CHRISTOPHER  [facility] Pager [phone]. If 8PM-8AM, please contact night-coverage at [email or web address], password [facility] 09/03/2012, 6:26 PM  LOS: 2 days

## 2012-09-03 NOTE — Consult Note (Signed)
#   [id number] is consult note.  [name]  Molli Hazard 5:7

## 2012-09-03 NOTE — Progress Notes (Signed)
ANTIBIOTIC CONSULT NOTE - FOLLOW UP  Pharmacy Consult for Cefepime, Vancomycin Indication: empirically for neutropenia  Allergies  Allergen Reactions  . Aspartame And Phenylalanine Diarrhea  . Codeine     Patient Measurements: Height: 5' 8.9" (175 cm) Weight: 160 lb (72.576 kg) IBW/kg (Calculated) : 70.47  Vital Signs: Temp: 98.6 F (37 C) (07/17 0720) Temp src: Oral (07/17 0720) BP: 157/73 mmHg (07/17 0720) Pulse Rate: 92 (07/17 0720) Intake/Output from previous day: 07/16 0701 - 07/17 0700 In: 1635 [P.O.:960; I.V.:75; IV Piggyback:600] Out: 1601 [Urine:1600; Stool:1] Intake/Output from this shift: Total I/O In: -  Out: 800 [Urine:800]  Labs:  Recent Labs  09/01/12 2330 09/02/12 0252 09/02/12 0500 09/03/12 0525 09/03/12 1010  WBC 0.3*  --  DUPLICATE SEE [id number] OK WITH CHRISTY TW  --  0.9*  HGB 9.5*  --  DUPLICATE SEE [id number] OK WITH CHRISTY TW  --  9.5*  PLT 100*  --  DUPLICATE SEE [id number] OK WITH CHRISTY TW  --  105*  CREATININE 0.77 0.74  --  0.65  --    Estimated Creatinine Clearance: 91.8 ml/min (by C-G formula based on Cr of 0.65).  Recent Labs  09/03/12 1335  VANCOTROUGH 16.6     Assessment: 67 yom with h/o localized non-Hodgkin lymphoma back in 1995, diagnosed with metastatic non-Hodgkin's lymphoma last admission and started R-[facility] inpatient 08/26/12. Patient was discharged 7/11, presented to ED 7/15 with hypotension and generalized weakness since discharge. ANC 0.2K, MD ordered to start Cefepime and Vancomycin empirically for neutropenia.   PTA >> Famciclovir >> PTA >> Septra DS >>  7/15 >> Cefepime >> 7/16 >> Vancomycin >> 7/16 >> Fluconazole x 7d (MD) >>   Today is D#3 of inpatient antibiotics. No source of infection found - CXR neg for acute findings, UA negative. Oncology has been consulted. Neuporgen added 7/16. Vancomycin trough this afternoon therapeutic on current vancomycin dose.  Goal of Therapy:  Vancomycin trough level 15-20  mcg/ml Appropriate renal dosing of antibiotics  Plan:   Continue Vancomycin 750 mg IV q8h  Continue Cefepime 2gm IV q8h  Pharmacy will f/u  Geoffry Paradise, PharmD, BCPS Pager: [phone] 2:40 PM Pharmacy #: [id number]

## 2012-09-03 NOTE — Telephone Encounter (Signed)
Pt aware of 7-24

## 2012-09-04 ENCOUNTER — Ambulatory Visit: Payer: Medicare Other | Admitting: Hematology & Oncology

## 2012-09-04 ENCOUNTER — Other Ambulatory Visit: Payer: Medicare Other | Admitting: Lab

## 2012-09-04 LAB — CBC WITH DIFFERENTIAL/PLATELET
Basophils Absolute: 0 10*3/uL (ref 0.0–0.1)
Basophils Relative: 2 % — ABNORMAL HIGH (ref 0–1)
Eosinophils Absolute: 0 10*3/uL (ref 0.0–0.7)
HCT: 26.4 % — ABNORMAL LOW (ref 39.0–52.0)
HCT: 28.5 % — ABNORMAL LOW (ref 39.0–52.0)
Hemoglobin: 9 g/dL — ABNORMAL LOW (ref 13.0–17.0)
Lymphs Abs: 0.3 10*3/uL — ABNORMAL LOW (ref 0.7–4.0)
MCH: 27.9 pg (ref 26.0–34.0)
MCH: 27 pg (ref 26.0–34.0)
MCHC: 34.1 g/dL (ref 30.0–36.0)
MCHC: 33 g/dL (ref 30.0–36.0)
MCV: 81.9 fL (ref 78.0–100.0)
Monocytes Absolute: 0.1 10*3/uL (ref 0.1–1.0)
Monocytes Absolute: 0.1 10*3/uL (ref 0.1–1.0)
Neutro Abs: 0.4 10*3/uL — ABNORMAL LOW (ref 1.7–7.7)
Neutro Abs: 0.6 10*3/uL — ABNORMAL LOW (ref 1.7–7.7)
Platelets: 92 10*3/uL — ABNORMAL LOW (ref 150–400)
RDW: 14.1 % (ref 11.5–15.5)
WBC: 1 10*3/uL — CL (ref 4.0–10.5)

## 2012-09-04 LAB — BASIC METABOLIC PANEL
BUN: 12 mg/dL (ref 6–23)
CO2: 25 mEq/L (ref 19–32)
Calcium: 8.5 mg/dL (ref 8.4–10.5)
Creatinine, Ser: 0.7 mg/dL (ref 0.50–1.35)
Glucose, Bld: 99 mg/dL (ref 70–99)

## 2012-09-04 LAB — GLUCOSE, CAPILLARY

## 2012-09-04 MED ORDER — POTASSIUM CHLORIDE 10 MEQ/50ML IV SOLN
10.0000 meq | INTRAVENOUS | Status: DC
  Filled 2012-09-04 (×4): qty 50

## 2012-09-04 MED ORDER — POTASSIUM CHLORIDE CRYS ER 20 MEQ PO TBCR
40.0000 meq | EXTENDED_RELEASE_TABLET | Freq: Once | ORAL | Status: AC
  Administered 2012-09-04: 40 meq via ORAL
  Filled 2012-09-04: qty 2

## 2012-09-04 MED ORDER — MAGIC MOUTHWASH
5.0000 mL | Freq: Four times a day (QID) | ORAL | Status: DC
  Administered 2012-09-04 – 2012-09-05 (×4): 5 mL via ORAL
  Filled 2012-09-04 (×7): qty 5

## 2012-09-04 NOTE — Progress Notes (Signed)
Inpatient Diabetes Program Recommendations  AACE/ADA: New Consensus Statement on Inpatient Glycemic Control (2013)  Target Ranges:  Prepandial:   less than 140 mg/dL      Peak postprandial:   less than 180 mg/dL (1-2 hours)      Critically ill patients:  140 - 180 mg/dL   Reason for Visit: Hyperglycemia  Steroids discontinued and FBS looks good. Ate 100% at breakfast.  Inpatient Diabetes Program Recommendations Insulin - Basal: Decrease Lantus to home dose of 20 units QHS to avoid early am hypoglycemia If post-prandial blood sugars continue to run >200, consider addition of Novolog 3 units tidwc for meal time insulin.  Will follow.  Thank you. Ailene Ards, RD, LDN, CDE Inpatient Diabetes Coordinator [phone]

## 2012-09-04 NOTE — Progress Notes (Signed)
Keith Duffy is improving nicely. His blood pressure is much better. So far cultures are negative. I think we can discontinue the vancomycin.  I don't believe he needs the heart monitor any more. His blood pressure is doing real well. He's eating okay. He is more active.   he's been afebrile. Continues on Neupogen. Once his white cell count is above 1, and we likely can consider him for discharge.  There is no cough or shortness of breath. There is no diarrhea. He's not having any pain.  His vital signs are stable. Blood pressure 133/73. Heart rate 91. Lungs are clear bilaterally. Cardiac exam regular in rhythm with normal S1-S2. Abdomen is soft. There are no masses. There is no palpable liver or spleen tip. Extremities shows no clubbing cyanosis or edema. Skin exam no rashes ecchymosis or petechia. Neurological exam shows no focal neurological deficits.  Labs show his white cell count be 0.9 hemoglobin 9 and platelet count 83. LDH is 583. BUN 12 and creatinine 0.7. Potassium 3.4.  Hopefully, we will be able to get him home over the weekend. His monocytes are all so this indicates his marrow is recovering.  I will replace his potassium today.  [name]   Psalm 91:1-2

## 2012-09-04 NOTE — Discharge Summary (Signed)
Physician Discharge Summary  Keith Duffy ZOX:[id number] DOB: 10/22/1945 DOA: 09/01/2012  PCP: Keith Duffy., MD  Admit date: 09/01/2012 Discharge date: 09/05/2012  Time spent: 40 minutes  Recommendations for Outpatient Follow-up:  1. Follow up with PMD.  2. Follow up with Dr Keith Duffy, Hematologist-Oncologist. 3. For CBC check on 09/07/12. Arranged by Dr Keith Duffy.   Discharge Diagnoses:  Principal Problem:   Hypotension Active Problems:   Dilated cardiomyopathy   NHL (non-Hodgkin's lymphoma)   Pancytopenia   Diabetes mellitus   Discharge Condition: Satisfactory.   Diet recommendation: Heart-Healthy/Carbohydrate-Modified.   Filed Weights   09/03/12 0720 09/04/12 0532 09/05/12 0504  Weight: 72.576 kg (160 lb) 71.7 kg (158 lb 1.1 oz) 71.26 kg (157 lb 1.6 oz)    History of present illness:  67 y.o. male with history of chronic back pain, HTN, DM-2, on chemotherapy since last week for non-Hodgkin's lymphoma, recently diagnosed with nonischemic cardiomyopathy, EF of 40%, referred from cardiology office as patient was found to be hypotensive. Patient states that over the last 2-3 days he has been feeling wobbly and weak with chills, but no fever. On 09/01/12, patient was seen at his cardiologists officer on follow up, when he was found to be hypotensive and referred to the ER. He had no nausea vomiting chest pain or shortness of breath. In the ER patient was found to be hypotensive and blood pressure improved with 1 L normal saline bolus. Labs showed pancytopenia with absolute neutrophil count less than 500. Admitted for further management.    Hospital Course:  1. Hypotension: Patient presented with BP of 97/63. Etiology is unclear, although he did have a high BUN/Creatinine ratio, suggestive of dehydration and volume depletion, but patient had no chest pain or SOB, O2 saturations were normal, and he did not look septic clinically. Managed with aggressive iv fluids, and as of AM  09/02/12, BP had normalized. Blood cultures remained negative, CXR was devoid of acute findings, cardiac enzymes were unelevated and urinalysis was negative. Patient had been on steroid therapy, so may have had some adrenal insufficency. He was initially administered stress doses of steroids, but as serum cortisol level was 47.0, this was discontinued. IV fluids were discontinued on 09/02/12, without deleterious effect. As BP started creeping up on 09/03/12, have beta-blocker was restarted, and BP is now controlled.  2. Non-Hodgkin Lymphoma/Pancytopenia: Wcc was 0.3, ANC 0.2, HB 9.5 and Plts 100 at presentation. This is likely due to chemotherapy for NHL, currently under care of Dr Keith Duffy. He did have a low grade temp of 99.2 on presentation, but no obvious foci of infection was been elicited during hospitalization. He remained apyrexial for the rest of his hospitalization. As described above, blood cultures remained negative. patient was initially managed  With iv Cefepime/Vancomycin, but this was discontinued on 09/04/12, without deleterious effect. Dr Keith Duffy provided oncology consultation, Neupogen was started on 09/02/12, with gradual improvemnt in WCC/neutrophil count. As of 09/05/12, wcc was 1.9, ANC 1.0. Patient will follow up with Dr Keith Duffy, on discharge.  3. Oral Thrush: This was an incidental finding on physical examination. Patient was placed on a 7-day course of Diflucan, to be concluded on 09/08/12.  4. Cardiomyopathy: Patient has known cardiomyopathy, under care of Dr Keith Duffy. EF 40%, per 2D Echocardiogram. MUGA revealed EF of 43% but showed no WMA. The plan is for ischemic evaluation via stress testing, when medically stable. During this hospitalization, he had no evidence of CHF decompensation.  5. Diabetes mellitus type 2: This  is insulin-requiring. Control is suspect, given random blood glucose of 228 at presentation. Continued on pre-admission treatment regimen, and SSI, adjusted as  indicated, and CBGs were satisfactory. Lantus has been increased to 25 units QHS.    Procedures:  See Below.   Consultations:  Dr Keith Duffy, Hematologist-Oncologist.   Discharge Exam: Filed Vitals:   09/04/12 1430 09/04/12 2126 09/05/12 0504 09/05/12 0959  BP: 113/61 143/79 135/81 135/66  Pulse: 100 98 99 102  Temp: 98 F (36.7 C) 98.8 F (37.1 C) 99.1 F (37.3 C)   TempSrc: Oral Oral Oral   Resp: 18 18 18    Height:      Weight:   71.26 kg (157 lb 1.6 oz)   SpO2: 99% 100% 98%     General: Comfortable, alert, communicative, fully oriented, not short of breath at rest.  HEENT: Mild clinical pallor, no jaundice, no conjunctival injection or discharge. Hydration is fair. Oral thrush has resolved.  NECK: Supple, JVP not seen, no carotid bruits, no palpable lymphadenopathy, no palpable goiter.  CHEST: Clinically clear to auscultation, no wheezes, no crackles.  HEART: Sounds 1 and 2 heard, normal, regular, no murmurs.  ABDOMEN: Full, soft, non-tender, no palpable organomegaly, no palpable masses, normal bowel sounds.  GENITALIA: Not examined.  LOWER EXTREMITIES: No pitting edema, palpable peripheral pulses.  MUSCULOSKELETAL SYSTEM: Unremarkable.  CENTRAL NERVOUS SYSTEM: No focal neurologic deficit on gross examination.  Discharge Instructions      Discharge Orders   Future Appointments Provider Department Dept Phone   09/10/2012 12:15 PM [name] [facility] [phone]   09/10/2012 12:45 PM [name], MD [facility] [phone]   Future Orders Complete By Expires     Diet - low sodium heart healthy  As directed     Diet Carb Modified  As directed     Increase activity slowly  As directed         Medication List    STOP taking these medications       losartan 50 MG tablet  Commonly known as:  COZAAR      TAKE these medications       allopurinol 300 MG tablet  Commonly known as:   ZYLOPRIM  Take 1 tablet (300 mg total) by mouth daily.     cyclobenzaprine 10 MG tablet  Commonly known as:  FLEXERIL  Take 10 mg by mouth 3 (three) times daily as needed for muscle spasms.     ENSURE PLUS Liqd  Take 237 mLs by mouth daily.     famciclovir 500 MG tablet  Commonly known as:  FAMVIR  Take 0.5 tablets (250 mg total) by mouth daily.     fluconazole 100 MG tablet  Commonly known as:  DIFLUCAN  Take 1 tablet (100 mg total) by mouth daily.     glucose blood test strip  Use as instructed     insulin aspart 100 UNIT/ML Sopn FlexPen  Commonly known as:  NOVOLOG FLEXPEN  - Inject 0-9 Units into the skin 3 (three) times daily with meals. CBG < 70: eat or drink something sweet & recheck blood sugar.  - CBG 70 - 120: 0 units   - CBG 121 - 150: 1 unit   - CBG 151 - 200: 2 units   - CBG 201 - 250: 3 units   - CBG 251 - 300: 5 units   - CBG 301 - 350: 7 units   -  CBG 351 - 400: 9 units   - CBG > 400: call MD     Insulin Glargine 100 UNIT/ML Sopn  Commonly known as:  LANTUS SOLOSTAR  Inject 25 Units into the skin every evening.     Insulin Pen Needle 31G X 6 MM Misc  Use with insulin Pen     metoprolol tartrate 25 MG tablet  Commonly known as:  LOPRESSOR  Take 1 tablet (25 mg total) by mouth 2 (two) times daily.     oxyCODONE 5 MG immediate release tablet  Commonly known as:  Oxy IR/ROXICODONE  Take 5 mg by mouth every 4 (four) hours as needed for pain.     sulfamethoxazole-trimethoprim 800-160 MG per tablet  Commonly known as:  BACTRIM DS,SEPTRA DS  Take 1 tablet by mouth every Monday, Wednesday, and Friday.       Allergies  Allergen Reactions  . Aspartame And Phenylalanine Diarrhea  . Codeine    Follow-up Information   Follow up with [facility] Valrie Hart., MD.   Contact information:   [address] [place] [address] [phone]       Schedule an appointment as soon as possible for a visit with [name], MD.   Contact  information:   [address] [place] [address] [phone]        The results of significant diagnostics from this hospitalization (including imaging, microbiology, ancillary and laboratory) are listed below for reference.    Significant Diagnostic Studies: Dg Chest 2 View  09/01/2012   *RADIOLOGY REPORT*  Clinical Data: Shortness of breath.  The patient is on chemotherapy.  CHEST - 2 VIEW  Comparison: 08/20/2012  Findings: Right jugular Port-A-Cath is present.  Catheter tip is in the upper SVC region.  Lungs are clear bilaterally.  No evidence for a pneumothorax.  Heart size is within normal limits and the trachea is midline.  Bony thorax is intact.  No significant pleural effusions.  IMPRESSION: No acute cardiopulmonary disease.   Original Report Authenticated By: Richarda Overlie, M.D.   Ct Chest W Contrast  08/21/2012   *RADIOLOGY REPORT*  Clinical Data:  Malignancy.  CT CHEST, ABDOMEN AND PELVIS WITH CONTRAST  Technique:  Multidetector CT imaging of the chest, abdomen and pelvis was performed following the standard protocol during bolus administration of intravenous contrast.  Contrast: OMNIPAQUE IOHEXOL 300 MG/ML  SOLN  Comparison:   None.  CT CHEST  Findings:  Nodule on the left thyroid lobe measures 23 mm.  The appearance on CT is nonspecific. There is no axillary adenopathy. No mediastinal or hilar adenopathy.  Three-vessel aortic arch. Aorta and branch vessels appear within normal limits.  Grossly, the pulmonary arteries are within normal limits.  No effusion.  The lungs demonstrate scattered areas of atelectasis.  No pulmonary mass lesion.  The central airways are patent.  IMPRESSION:  1.  23 mm left thyroid lobe nodule. Consider further evaluation with thyroid ultrasound.  If patient is clinically hyperthyroid, consider nuclear medicine thyroid uptake and scan. 2.  No evidence of primarily density in the chest.  CT ABDOMEN AND PELVIS  Findings:  Liver:  Nonspecific 11 mm  low density lesion is present in the right hepatic lobe (image number 46 series 2).  Other small areas of decreased attenuation are present in the right hepatic lobe, too small to characterize.  All of these lesions are indeterminant on today's study.  Spleen:  Normal.  Gallbladder:  Contracted.  Common bile duct:  Normal.  Pancreas:  Normal.  Adrenal glands:  Right adrenal gland normal.  The left adrenal gland appears normal however there is a nodule at the genu head is inseparable from the gland itself.  This nodule probably represents a retroperitoneal lymph node and measures 17 mm x 18 mm with surrounding inflammatory changes.  Kidneys:  Exophytic right upper pole simple renal cyst is present that measures 3 cm.  Left upper pole nonobstructing renal collecting system calculus measures 5 mm.  Normal renal enhancement and delayed excretion.  Both ureters appear within normal limits.  Retroperitoneal adenopathy is present.  The largest lymph node measures 6 cm AP by 2 cm transverse (image number 65 series 2). Other smaller periaortic lymph nodes are present.  Stomach:  No mass lesion.  Grossly normal.  Small bowel:  Normal appearance of duodenum.  Small bowel is decompressed.  No obstruction.  There is no mesenteric adenopathy. Mesenteric vasculature appears normal.  Colon:   Normal appendix.  Colonic diverticulosis.  No discrete mass lesion is identified.  Diverticular hypertrophy of the sigmoid.  Pelvic Genitourinary:  Prostatomegaly.  Eccentrically enlarged prostate gland to the right.  Bones:  No pathologic fracture is identified.  The marrow infiltration seen on MRI is poorly appreciated on CT.  There is some heterogeneous mineralization but no focal destructive osseous lesion.  Hip osteoarthritis is present.  No pathologic fracture is identified.  Nonspecific sclerotic lesion in the medial left iliac bone noted. Potentially this represents a bone island.  Vasculature: Atherosclerosis.  No acute vascular  abnormality.  No aneurysm.  Body Wall: Within normal limits.  IMPRESSION: 1.  Retroperitoneal adenopathy.  Although nonspecific, this probably represents lymphoma. Metastatic lymphadenopathy is also possible.  Largest index node is interposed between the aorta and the left kidney measuring 6 cm x 2 cm. 2.  Indeterminant small low-density right hepatic lobe lesions. Metastatic disease is impossible to exclude. 3.  Right renal cyst. 4.  5 mm nonobstructing left upper pole renal collecting system calculus.   Original Report Authenticated By: Andreas Newport, M.D.   Mr Laqueta Jean Wo Contrast  08/24/2012   *RADIOLOGY REPORT*  Clinical Data: 67 year old male with remote history of non-Hodgkins lymphoma. Abnormal abdominal lymph node recently biopsied. Confusion. Staging.  MRI HEAD WITHOUT AND WITH CONTRAST  Technique:  Multiplanar, multiecho pulse sequences of the brain and surrounding structures were obtained according to standard protocol without and with intravenous contrast  Contrast: 15mL MULTIHANCE GADOBENATE DIMEGLUMINE 529 MG/ML IV SOLN  Comparison: None.  Findings: Cerebral volume is within normal limits for age.  No restricted diffusion to suggest acute infarction.  No midline shift, mass effect, evidence of mass lesion, ventriculomegaly, extra-axial collection or acute intracranial hemorrhage. Cervicomedullary junction and pituitary are within normal limits. Negative visualized cervical spinal cord.  Major intracranial vascular flow voids are preserved.  Left lentiform nuclei T2 and FLAIR hyperintensity compatible with small chronic lacunar infarcts.  Elsewhere, normal gray and white matter signal for age.  No abnormal enhancement of the brain.  Incidental right anterior frontal lobe developmental venous anomaly.  A punctate focus of high signal in the superior cerebellum following contrast seen only on sagittal image 14 (series 13) is felt to be vascular related.  Bone marrow signal is heterogeneous and abnormal  in the clivus, the occipital condyles, and throughout the visualized cervical spine. The calvarium appears spared at this time.  No overtly destructive osseous lesion is identified.  Visualized orbit soft tissues are within normal limits.  Visualized paranasal  sinuses and mastoids are clear.  Grossly normal visualized internal auditory structures.  Negative scalp soft tissues.  IMPRESSION: 1. No acute intracranial abnormality or metastatic disease to the brain identified. 2.  Abnormal bone marrow signal at the skull base and in the cervical spine is suspicious for lymphomatous involvement in this setting, but red marrow reactivation due to anemia could also have this appearance. 3. Mild for age chronic small vessel ischemia.   Original Report Authenticated By: Erskine Speed, M.D.   Mr Thoracic Spine W Wo Contrast  08/20/2012   *RADIOLOGY REPORT*  Clinical Data:  Back pain  MRI THORACIC AND LUMBAR SPINE WITHOUT AND WITH CONTRAST  Technique:  Multiplanar and multiecho pulse sequences of the thoracic and lumbar spine were obtained without and with intravenous contrast.  Contrast: 15mL MULTIHANCE GADOBENATE DIMEGLUMINE 529 MG/ML IV SOLN  Comparison:   None.  MRI THORACIC SPINE  Findings: The vertebral bodies are normally aligned with preservation of the normal thoracic kyphosis.  There is no listhesis.  Vertebral body heights are preserved.  Signal intensity throughout the visualized vertebral body bone marrow is diffusely abnormal with a mottled T1 hypointense, T2 hyperintense appearance.  There is diffuse patchy heterogeneous enhancement seen throughout all the visualized bone marrow. Findings are most consistent with diffuse osseous metastases. No epidural extension of tumor is seen. A single discrete lesion is difficult to measure and demarcated.  There are no epidural collections.  No significant canal or neural foraminal impingement is identified. No pathologic fracture.  Incidental note is made of an exophytic  right renal cyst.   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  MRI LUMBAR SPINE  Findings: The vertebral bodies are normally aligned with preservation of the normal lumbar lordosis.  There is no listhesis. Vertebral body heights are preserved.  Signal intensity throughout the visualized vertebral body bone marrow is diffusely abnormal with a mild T1 hypointense, T2 hyperintense appearance.  There is diffuse patchy heterogeneous enhancement on post contrast sequences.  Findin similar changes are seen within the bones of the pelvis.  gs are consistent with diffuse osseous metastases.  It is difficult to measure a single discrete lesion, however, enhancement is most evident within the L3 vertebral body. No epidural tumor.  No pathologic fracture.  At L5-S1, postoperative changes from prior right hemilaminectomy are seen.  There appears to be a small amount of enhancing scar tissue along the right anterolateral aspect of the thecal sac not significant canal impingement.  There is diffuse degenerative disc bulge with disc desiccation and bilateral facet arthrosis at this level. No evident recurrent disc herniation.  There is resultant moderate bilateral foraminal narrowing.  No other significant disc disease identified within the lumbar spine.  IMPRESSION:  1.  Diffuse abnormal heterogeneous signal intensity throughout the vertebral body bone marrow with heterogeneous enhancement, worrisome for diffuse osseous metastases. 2. Postsurgical changes from prior right hemilaminectomy at L5-S1 with a small amount of enhancing scar tissue formation along the right lateral aspect of the thecal sac.  Degenerative disc disease at L5-S1 as above without recurrent focal disc herniation.  Critical Value/emergent results were called by telephone at the time of interpretation on 08/20/2012 at 10:50 p.m. to [facility], who verbally acknowledged these results.   Original Report Authenticated By: Rise Mu, M.D.   Mr Lumbar Spine W Wo Contrast  08/20/2012   *RADIOLOGY REPORT*  Clinical Data:  Back pain  MRI THORACIC AND LUMBAR SPINE WITHOUT AND WITH CONTRAST  Technique:  Multiplanar and multiecho pulse sequences of the thoracic and lumbar spine were obtained without and with intravenous contrast.  Contrast: 15mL MULTIHANCE GADOBENATE DIMEGLUMINE 529 MG/ML IV SOLN  Comparison:   None.  MRI THORACIC SPINE  Findings: The vertebral bodies are normally aligned with preservation of the normal thoracic kyphosis.  There is no listhesis.  Vertebral body heights are preserved.  Signal intensity throughout the visualized vertebral body bone marrow is diffusely abnormal with a mottled T1 hypointense, T2 hyperintense appearance.  There is diffuse patchy heterogeneous enhancement seen throughout all the visualized bone marrow. Findings are most consistent with diffuse osseous metastases. No epidural extension of tumor is seen. A single discrete lesion is difficult to measure and demarcated.  There are no epidural collections.  No significant canal or neural foraminal impingement is identified. No pathologic fracture.  Incidental note is made of an exophytic right renal cyst.   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  MRI LUMBAR SPINE  Findings: The vertebral bodies are normally aligned with preservation of the normal lumbar lordosis.  There is no listhesis. Vertebral body heights are preserved.  Signal intensity throughout the visualized vertebral body bone marrow is diffusely abnormal with a mild T1 hypointense, T2 hyperintense appearance.  There is diffuse patchy heterogeneous enhancement on post contrast sequences.  Findin similar changes are seen within the bones of the pelvis.  gs are consistent with diffuse osseous metastases.  It is difficult to measure a single discrete lesion, however, enhancement is most evident within the L3 vertebral body. No epidural tumor.  No pathologic  fracture.  At L5-S1, postoperative changes from prior right hemilaminectomy are seen.  There appears to be a small amount of enhancing scar tissue along the right anterolateral aspect of the thecal sac not significant canal impingement.  There is diffuse degenerative disc bulge with disc desiccation and bilateral facet arthrosis at this level. No evident recurrent disc herniation.  There is resultant moderate bilateral foraminal narrowing.  No other significant disc disease identified within the lumbar spine.  IMPRESSION:  1.  Diffuse abnormal heterogeneous signal intensity throughout the vertebral body bone marrow with heterogeneous enhancement, worrisome for diffuse osseous metastases. 2. Postsurgical changes from prior right hemilaminectomy at L5-S1 with a small amount of enhancing scar tissue formation along the right lateral aspect of the thecal sac.  Degenerative disc disease at L5-S1 as above without recurrent focal disc herniation.  Critical Value/emergent results were called by telephone at the time of interpretation on 08/20/2012 at 10:50 p.m. to [facility], who verbally acknowledged these results.   Original Report Authenticated By: Rise Mu, M.D.   Nm Cardiac Muga Rest  08/25/2012   *RADIOLOGY REPORT*  Clinical Data: Non-Hodgkins lymphoma, pre toxic chemotherapy  NUCLEAR MEDICINE CARDIAC MULTIPLE UPTAKE GATED ACQUISITION SCAN (CARDIAC MUGA)  Technique: Radiolabeled red blood cells used to perform resting radionuclide ventriculography. Imaging performed in the anterior, LAO, and lateral projections.  Resting left ventricular ejection fraction estimated after drawing region of interest curves around the left ventricle during systole and diastole.  Radiopharmaceutical: 25 mCi Tc-57m pertechnetate labeled autologous red cells  Comparison: None  Findings: Left ventricular ejection fraction is calculated at 43%, below the normal range. Acquisition performed at a cardiac rate of 110 beats per  minute. Wall motion analysis in three projections shows no focal wall motion abnormalities.  IMPRESSION: Mildly decreased left ventricular ejection fraction of 43% without focal wall motion abnormalities.   Original Report Authenticated By: Ulyses Southward, M.D.   Ct  Abdomen Pelvis W Contrast  08/21/2012   *RADIOLOGY REPORT*  Clinical Data:  Malignancy.  CT CHEST, ABDOMEN AND PELVIS WITH CONTRAST  Technique:  Multidetector CT imaging of the chest, abdomen and pelvis was performed following the standard protocol during bolus administration of intravenous contrast.  Contrast: OMNIPAQUE IOHEXOL 300 MG/ML  SOLN  Comparison:   None.  CT CHEST  Findings:  Nodule on the left thyroid lobe measures 23 mm.  The appearance on CT is nonspecific. There is no axillary adenopathy. No mediastinal or hilar adenopathy.  Three-vessel aortic arch. Aorta and branch vessels appear within normal limits.  Grossly, the pulmonary arteries are within normal limits.  No effusion.  The lungs demonstrate scattered areas of atelectasis.  No pulmonary mass lesion.  The central airways are patent.  IMPRESSION:  1.  23 mm left thyroid lobe nodule. Consider further evaluation with thyroid ultrasound.  If patient is clinically hyperthyroid, consider nuclear medicine thyroid uptake and scan. 2.  No evidence of primarily density in the chest.  CT ABDOMEN AND PELVIS  Findings:  Liver:  Nonspecific 11 mm low density lesion is present in the right hepatic lobe (image number 46 series 2).  Other small areas of decreased attenuation are present in the right hepatic lobe, too small to characterize.  All of these lesions are indeterminant on today's study.  Spleen:  Normal.  Gallbladder:  Contracted.  Common bile duct:  Normal.  Pancreas:  Normal.  Adrenal glands:  Right adrenal gland normal.  The left adrenal gland appears normal however there is a nodule at the genu head is inseparable from the gland itself.  This nodule probably represents a  retroperitoneal lymph node and measures 17 mm x 18 mm with surrounding inflammatory changes.  Kidneys:  Exophytic right upper pole simple renal cyst is present that measures 3 cm.  Left upper pole nonobstructing renal collecting system calculus measures 5 mm.  Normal renal enhancement and delayed excretion.  Both ureters appear within normal limits.  Retroperitoneal adenopathy is present.  The largest lymph node measures 6 cm AP by 2 cm transverse (image number 65 series 2). Other smaller periaortic lymph nodes are present.  Stomach:  No mass lesion.  Grossly normal.  Small bowel:  Normal appearance of duodenum.  Small bowel is decompressed.  No obstruction.  There is no mesenteric adenopathy. Mesenteric vasculature appears normal.  Colon:   Normal appendix.  Colonic diverticulosis.  No discrete mass lesion is identified.  Diverticular hypertrophy of the sigmoid.  Pelvic Genitourinary:  Prostatomegaly.  Eccentrically enlarged prostate gland to the right.  Bones:  No pathologic fracture is identified.  The marrow infiltration seen on MRI is poorly appreciated on CT.  There is some heterogeneous mineralization but no focal destructive osseous lesion.  Hip osteoarthritis is present.  No pathologic fracture is identified.  Nonspecific sclerotic lesion in the medial left iliac bone noted. Potentially this represents a bone island.  Vasculature: Atherosclerosis.  No acute vascular abnormality.  No aneurysm.  Body Wall: Within normal limits.  IMPRESSION: 1.  Retroperitoneal adenopathy.  Although nonspecific, this probably represents lymphoma. Metastatic lymphadenopathy is also possible.  Largest index node is interposed between the aorta and the left kidney measuring 6 cm x 2 cm. 2.  Indeterminant small low-density right hepatic lobe lesions. Metastatic disease is impossible to exclude. 3.  Right renal cyst. 4.  5 mm nonobstructing left upper pole renal collecting system calculus.   Original Report Authenticated By:  Andreas Newport, M.D.  Ir Fluoro Guide Cv Line Right  08/25/2012   *RADIOLOGY REPORT*  Clinical Data: History of lymphoma.  Needs Port-A-Cath for chemotherapy.  FLUOROSCOPIC AND ULTRASOUND GUIDED PLACEMENT OF A SUBCUTANEOUS PORT.  Physician: Rachelle Hora. Lowella Dandy, MD  Medications:The patient was sedated for the bone marrow biopsy that immediately proceeded this procedure. A radiology nurse monitored the patient for moderate sedation.  Ancef 2 gm.   As antibiotic prophylaxis, Ancef  was ordered pre- procedure and administered intravenously within one hour of incision.  Moderate sedation time:25 minutes  Fluoroscopy time: 24 seconds  Procedure:  The risks of the procedure were explained to the patient.  Informed consent was obtained.  Patient was placed supine on the interventional table.  Ultrasound confirmed a patent right internal jugular vein.  The right chest and neck were cleaned with a skin antiseptic and a sterile drape was placed.  Maximal barrier sterile technique was utilized including caps, mask, sterile gowns, sterile gloves, sterile drape, hand hygiene and skin antiseptic. The right neck was anesthetized with 1% lidocaine.  Small incision was made in the right neck with a blade.  Micropuncture set was placed in the right IJ with ultrasound guidance.  The micropuncture wire was used for measurement purposes.  The right chest was anesthetized with 1% lidocaine with epinephrine.  #15 blade was used to make an incision and a subcutaneous port pocket was formed. 8 french Power Port was assembled.  Subcutaneous tunnel was formed with a stiff tunneling device.  The port catheter was brought through the subcutaneous tunnel.  The port was placed in the subcutaneous pocket.  The micropuncture set was exchanged for a peel-away sheath.  The catheter was placed through the peel-away sheath and the tip was positioned in the SVC.  Catheter placement was confirmed with fluoroscopy.  The port was accessed and flushed with  heparinized saline.  The port pocket was closed using two layers of absorbable sutures and Dermabond.  The vein skin site was closed using a single layer of absorbable suture and Dermabond. Sterile dressings were applied.  Patient tolerated the procedure well without an immediate complication.  Ultrasound and fluoroscopic images were taken and saved for this procedure.  Complications: None  Impression:  Placement of a subcutaneous port device.  The catheter tip is in the SVC and ready to be used.   Original Report Authenticated By: Richarda Overlie, M.D.   Ir US Guide Vasc Access Right  08/25/2012   *RADIOLOGY REPORT*  Clinical Data: History of lymphoma.  Needs Port-A-Cath for chemotherapy.  FLUOROSCOPIC AND ULTRASOUND GUIDED PLACEMENT OF A SUBCUTANEOUS PORT.  Physician: Rachelle Hora. Lowella Dandy, MD  Medications:The patient was sedated for the bone marrow biopsy that immediately proceeded this procedure. A radiology nurse monitored the patient for moderate sedation.  Ancef 2 gm.   As antibiotic prophylaxis, Ancef  was ordered pre- procedure and administered intravenously within one hour of incision.  Moderate sedation time:25 minutes  Fluoroscopy time: 24 seconds  Procedure:  The risks of the procedure were explained to the patient.  Informed consent was obtained.  Patient was placed supine on the interventional table.  Ultrasound confirmed a patent right internal jugular vein.  The right chest and neck were cleaned with a skin antiseptic and a sterile drape was placed.  Maximal barrier sterile technique was utilized including caps, mask, sterile gowns, sterile gloves, sterile drape, hand hygiene and skin antiseptic. The right neck was anesthetized with 1% lidocaine.  Small incision was made in the right neck with  a blade.  Micropuncture set was placed in the right IJ with ultrasound guidance.  The micropuncture wire was used for measurement purposes.  The right chest was anesthetized with 1% lidocaine with epinephrine.  #15 blade  was used to make an incision and a subcutaneous port pocket was formed. 8 french Power Port was assembled.  Subcutaneous tunnel was formed with a stiff tunneling device.  The port catheter was brought through the subcutaneous tunnel.  The port was placed in the subcutaneous pocket.  The micropuncture set was exchanged for a peel-away sheath.  The catheter was placed through the peel-away sheath and the tip was positioned in the SVC.  Catheter placement was confirmed with fluoroscopy.  The port was accessed and flushed with heparinized saline.  The port pocket was closed using two layers of absorbable sutures and Dermabond.  The vein skin site was closed using a single layer of absorbable suture and Dermabond. Sterile dressings were applied.  Patient tolerated the procedure well without an immediate complication.  Ultrasound and fluoroscopic images were taken and saved for this procedure.  Complications: None  Impression:  Placement of a subcutaneous port device.  The catheter tip is in the SVC and ready to be used.   Original Report Authenticated By: Richarda Overlie, M.D.   Ct Biopsy  08/25/2012   *RADIOLOGY REPORT*  Clinical history: 67 year old with anemia and history of lymphoma.   PROCEDURE(S): CT GUIDED BONE MARROW ASPIRATES AND BIOPSY  Physician: Rachelle Hora. Henn, MD   Medications: Versed 2 mg, Fentanyl 100 mcg.  A radiology nurse monitored the patient for moderate sedation.  Sedation time:  25 minutes   Procedure: The procedure was explained to the patient. The risks and benefits of the procedure were discussed and the patient's questions were addressed.  Informed consent was obtained from the patient. The patient was placed prone on CT scan. Images of the pelvis were obtained. The right side of back was prepped and draped in sterile fashion. The skin and right posterior iliac bone were anesthetized with 1% lidocaine.   11 gauge bone needle was directed into the right iliac bone with CT guidance.  It was very  difficult to obtain aspirate material.  A total of four aspirates were obtained.  Two core biopsies were performed and one adequate core specimen was obtained.  Findings:Biopsy needle directed into the posterior right iliac bone.  Complications: None   Impression: CT guided bone marrow aspirates and core biopsy.   Original Report Authenticated By: Richarda Overlie, M.D.   Ct Biopsy  08/24/2012   *RADIOLOGY REPORT*  Clinical Data: Left retroperitoneal lymph node mass and history of non-Hodgkin's lymphoma.  CT GUIDED CORE BIOPSY OF RETROPERITONEAL MASS  Sedation:   2.0 mg IV Versed;  50 mcg IV Fentanyl  Total Moderate Sedation Time: 20 minutes.  Procedure:  The procedure risks, benefits, and alternatives were explained to the patient.  Questions regarding the procedure were encouraged and answered.  The patient understands and consents to the procedure.  The left paraspinous region was prepped with Betadine in a sterile fashion, and a sterile drape was applied covering the operative field.  A sterile gown and sterile gloves were used for the procedure.  Local anesthesia was provided with 1% Lidocaine.  CT was performed in a prone position.  Under CT guidance, a 17 gauge trocar needle was advanced from a left paraspinous approach into the retroperitoneal space.  After confirming needle tip position, coaxial 18-gauge core biopsy samples were obtained and  submitted in saline.  A total of six core biopsy samples were obtained and submitted.  Complications: None  Findings: Left-sided para-aortic retroperitoneal lymph node mass measures approximately 2.6 x 5.5 cm.  Solid tissue was obtained. Postbiopsy CT imaging shows no evidence of hemorrhage or other complication immediately following the procedure.  IMPRESSION: CT guided core biopsy performed of a left para-aortic retroperitoneal lymph node mass.   Original Report Authenticated By: Irish Lack, M.D.   Dg Chest Port 1 View  08/20/2012   *RADIOLOGY REPORT*  Clinical Data:  Tachycardia  PORTABLE CHEST - 1 VIEW  Comparison: None.  Findings: Lungs are clear. No pleural effusion or pneumothorax.  The heart is top normal in size.  IMPRESSION: No evidence of acute cardiopulmonary disease.   Original Report Authenticated By: Charline Bills, M.D.    Microbiology: Recent Results (from the past 240 hour(s))  CULTURE, BLOOD (ROUTINE X 2)     Status: None   Collection Time    09/01/12  7:55 PM      Result Value Range Status   Specimen Description BLOOD RIGHT ARM   Final   Special Requests BOTTLES DRAWN AEROBIC ONLY 3CC   Final   Culture  Setup Time 09/02/2012 02:19   Final   Culture     Final   Value:        BLOOD CULTURE RECEIVED NO GROWTH TO DATE CULTURE WILL BE HELD FOR 5 DAYS BEFORE ISSUING A FINAL NEGATIVE REPORT   Report Status PENDING   Incomplete  CULTURE, BLOOD (ROUTINE X 2)     Status: None   Collection Time    09/01/12  8:00 PM      Result Value Range Status   Specimen Description BLOOD RIGHT HAND   Final   Special Requests BOTTLES DRAWN AEROBIC AND ANAEROBIC 4CC   Final   Culture  Setup Time 09/02/2012 03:02   Final   Culture     Final   Value:        BLOOD CULTURE RECEIVED NO GROWTH TO DATE CULTURE WILL BE HELD FOR 5 DAYS BEFORE ISSUING A FINAL NEGATIVE REPORT   Report Status PENDING   Incomplete     Labs: Basic Metabolic Panel:  Recent Labs Lab 09/01/12 1712 09/01/12 2330 09/02/12 0252 09/03/12 0525 09/04/12 0458 09/05/12 0520  NA 134*  --  134* 135 137 137  K 3.8  --  3.8 3.9 3.4* 3.4*  CL 100  --  101 103 104 102  CO2  --   --  22 23 25 24   GLUCOSE 168*  --  228* 177* 99 87  BUN 25*  --  25* 16 12 11   CREATININE 0.90 0.77 0.74 0.65 0.70 0.68  CALCIUM  --   --  8.1* 8.4 8.5 8.9   Liver Function Tests:  Recent Labs Lab 09/02/12 0252  AST 18  ALT 39  ALKPHOS 191*  BILITOT 0.3  PROT 5.1*  ALBUMIN 2.0*   No results found for this basename: LIPASE, AMYLASE,  in the last 168 hours No results found for this basename: AMMONIA,   in the last 168 hours CBC:  Recent Labs Lab 09/02/12 0500 09/03/12 1010 09/04/12 0458 09/04/12 0955 09/05/12 0520  WBC DUPLICATE SEE [id number] OK WITH CHRISTY TW 0.9* 0.9* 1.0* 1.9*  NEUTROABS PENDING 0.4* 0.4* 0.6* 1.0*  HGB DUPLICATE SEE [id number] OK WITH CHRISTY TW 9.5* 9.0* 9.4* 9.3*  HCT DUPLICATE SEE [id number] OK WITH CHRISTY TW 28.4* 26.4* 28.5* 28.4*  MCV  DUPLICATE SEE [id number] OK WITH CHRISTY TW 80.9 81.7 81.9 82.6  PLT DUPLICATE SEE [id number] OK WITH CHRISTY TW 105* 83* 92* 102*   Cardiac Enzymes:  Recent Labs Lab 09/01/12 2330  TROPONINI <0.30   BNP: BNP (last 3 results) No results found for this basename: PROBNP,  in the last 8760 hours CBG:  Recent Labs Lab 09/03/12 2127 09/04/12 0754 09/04/12 1156 09/04/12 1710 09/05/12 0734  GLUCAP 112* 148* 160* 229* 84       Signed:  Ivannia Willhelm,CHRISTOPHER  [facility] 09/05/2012, 10:39 AM

## 2012-09-04 NOTE — Clinical Documentation Improvement (Signed)
THIS DOCUMENT IS NOT A PERMANENT PART OF THE MEDICAL RECORD  Please update your documentation with the medical record to reflect your response to this query. If you need help knowing how to do this please call [phone]  09/04/12   Dear Dr. [facility],  In a better effort to capture your patient's severity of illness, reflect appropriate length of stay and utilization of resources, a review of the patient medical record has revealed the following indicators.    Based on your clinical judgment, please clarify and document in a progress note and/or discharge summary the clinical condition associated with the following supporting information:  In responding to this query please exercise your independent judgment.  The fact that a query is asked, does not imply that any particular answer is desired or expected.  09/03/12 Nutr note "Pt meets criteria for SEVERE MALNUTRITION in the context of Chronic Illness as evidenced by 24% wt loss in less than 2 months and PO intake <75% of estimated energy requirements for >1 month based on pt's report". After study and for accurate Dx specificity & severity, please help validate nutrition findings for cond being eval'd, mon'd & tx'd. Thank you  Possible Clinical Conditions? Mild Malnutrition  Moderate Malnutrition Severe Malnutrition   Protein Calorie Malnutrition Severe Protein Calorie Malnutrition Emaciation  Cachexia   _Other Condition Cannot clinically determine   Supporting Information: See Nutrition Eval 09/03/12  Signs & Symptoms: See Nutrition Eval 09/03/12  Diagnostics: See Nutrition Eval 09/03/12  Treatment  See Nutrition Eval 09/03/12  Nutrition Consult See Nutrition Eval 09/03/12  You may use possible, probable, or suspect with inpatient documentation. possible, probable, suspected diagnoses MUST be documented at the time of discharge  Reviewed:  no additional documentation provided  Thank You,  Toribio Harbour,  RN, BSN, CCDS Certified Clinical Documentation Specialist Pager: [phone] [facility] Information Management [place]

## 2012-09-04 NOTE — Progress Notes (Signed)
[facility] PROGRESS NOTE  Keith Duffy OZH:[id number] DOB: 06/26/1945 DOA: 09/01/2012 PCP: [name], MD  Assessment/Plan: Principal Problem:   Hypotension Active Problems:   Dilated cardiomyopathy   NHL (non-Hodgkin's lymphoma)   Pancytopenia   Diabetes mellitus    1. Hypotension: Patient presented with BP of 97/63. Etiology is unclear, although he did have a high BUN/Creatinine ratio, suggestive of dehydration and volume depletion. but patient had no chest pain or SOB, O2 saturations were normal, and he did not look septic, clinically. Managed with aggressive iv fluids, and as of AM 09/02/12, BP had normalized. Blood cultures are negative so far, CXR is devoid of acute findings, cardiac enzymes were unelevated and urinalysis is negative. Patient had been on steroid therapy, so may have had some adrenal insufficency.Was initially administered stress doses of steroids, but as serum cortisol level was 47.0, this was discontinued. IV fluids were discontinued on 09/02/12, without deleterious effect. As BP is creeping up today, have restarted beta-blocker, and BP is now controlled.  2. Non-Hodgkin Lymphoma/Pancytopenia: Wcc was 0.3, ANC 0.2, HB 9.5 and Plts 100 at presentation. This is likely due to chemotherapy for NHL, currently under care of Dr [name]. He did have a low grade temp of 99.2 on presentation, but no obvious foci of infection has been elicited so far, he has since been apyrexial and blood cultures remained negative. On Cefepime/Vancomycin, day# 4. Following CBC/Diff. Neupogen was started on 09/02/12, and [facility] is improving gradually. Dr [name] provided oncology consultation. Have discontinued antibiotics today.  3. Oral Thrush: This was an incidental finding on physical examination. Now on day# 3 of a 7-day course of Diflucan.  4. Cardiomyopathy: Patient has known cardiomyopathy, under care of Dr [name]. EF 40% per 2D Echocardiogram. MUGA revealed EF of 43%  but showed no WMA. The plan is for ischemic evaluation via stress testing, when medically stable. Currently, he has no evidence of CHF decompensation. 5. Diabetes mellitus type 2: This is insulin-requiring. Control is suspect, given random blood glucose of 228 at presentation. Continued on pre-admission treatment regimen, and SSI, adjusted as indicated. HBA1C is pending.   Code Status: Full Code.  Family Communication:  Disposition Plan: To be determined.    Brief narrative: 67 y.o. male with history of chronic back pain, HTN, DM-2, on chemotherapy since last week for non-Hodgkin's lymphoma, recently diagnosed with nonischemic cardiomyopathy, EF of 40%, referred from cardiology office as patient was found to be hypotensive. Patient states that over the last 2-3 days he has been feeling wobbly and weak with chills, but no fever. On 09/01/12, patient was seen at his cardiologists officer on follow up, when he was found to be hypotensive and referred to the ER. He had no nausea vomiting chest pain or shortness of breath. In the ER patient was found to be hypotensive and blood pressure improved with 1 L normal saline bolus. Labs showed pancytopenia with absolute neutrophil count less than 500. Admitted for further management.    Consultants:  N/A.   Procedures:  CXR.   Antibiotics:  Cefepime 09/01/12>>>  Vancomycin 09/01/12>>>  HPI/Subjective: Has scratchy sensation in throat, otherwise, no new issues.   Objective: Vital signs in last 24 hours: Temp:  [97.9 F (36.6 C)-99.2 F (37.3 C)] 97.9 F (36.6 C) (07/18 0532) Pulse Rate:  [91-107] 99 (07/18 1007) Resp:  [18-20] 20 (07/18 0532) BP: (121-141)/(64-76) 121/74 mmHg (07/18 1007) SpO2:  [100 %] 100 % (07/18 0532) Weight:  [71.7 kg (158 lb 1.1 oz)]  71.7 kg (158 lb 1.1 oz) (07/18 0532) Weight change:  Last BM Date: 09/02/12  Intake/Output from previous day: 07/17 0701 - 07/18 0700 In: 620 [P.O.:420; IV Piggyback:200] Out: 2001  [Urine:2000; Stool:1] Total I/O In: 240 [P.O.:240] Out: -    Physical Exam: General: Comfortable, alert, communicative, fully oriented, not short of breath at rest.  HEENT:  Mild clinical pallor, no jaundice, no conjunctival injection or discharge. Hydration is fair. Oral thrush has resolved.  NECK:  Supple, JVP not seen, no carotid bruits, no palpable lymphadenopathy, no palpable goiter. CHEST:  Clinically clear to auscultation, no wheezes, no crackles. HEART:  Sounds 1 and 2 heard, normal, regular, no murmurs. ABDOMEN:  Full, soft, non-tender, no palpable organomegaly, no palpable masses, normal bowel sounds. GENITALIA:  Not examined. LOWER EXTREMITIES:  No pitting edema, palpable peripheral pulses. MUSCULOSKELETAL SYSTEM:  Unremarkable. CENTRAL NERVOUS SYSTEM:  No focal neurologic deficit on gross examination.  Lab Results:  Recent Labs  09/04/12 0458 09/04/12 0955  WBC 0.9* 1.0*  HGB 9.0* 9.4*  HCT 26.4* 28.5*  PLT 83* 92*    Recent Labs  09/03/12 0525 09/04/12 0458  NA 135 137  K 3.9 3.4*  CL 103 104  CO2 23 25  GLUCOSE 177* 99  BUN 16 12  CREATININE 0.65 0.70  CALCIUM 8.4 8.5   Recent Results (from the past 240 hour(s))  CULTURE, BLOOD (ROUTINE X 2)     Status: None   Collection Time    09/01/12  7:55 PM      Result Value Range Status   Specimen Description BLOOD RIGHT ARM   Final   Special Requests BOTTLES DRAWN AEROBIC ONLY 3CC   Final   Culture  Setup Time 09/02/2012 02:19   Final   Culture     Final   Value:        BLOOD CULTURE RECEIVED NO GROWTH TO DATE CULTURE WILL BE HELD FOR 5 DAYS BEFORE ISSUING A FINAL NEGATIVE REPORT   Report Status PENDING   Incomplete  CULTURE, BLOOD (ROUTINE X 2)     Status: None   Collection Time    09/01/12  8:00 PM      Result Value Range Status   Specimen Description BLOOD RIGHT HAND   Final   Special Requests BOTTLES DRAWN AEROBIC AND ANAEROBIC 4CC   Final   Culture  Setup Time 09/02/2012 03:02   Final   Culture      Final   Value:        BLOOD CULTURE RECEIVED NO GROWTH TO DATE CULTURE WILL BE HELD FOR 5 DAYS BEFORE ISSUING A FINAL NEGATIVE REPORT   Report Status PENDING   Incomplete     Studies/Results: No results found.  Medications: Scheduled Meds: . ceFEPime (MAXIPIME) IV  2 g Intravenous Q8H  . enoxaparin (LOVENOX) injection  40 mg Subcutaneous Q24H  . famciclovir  250 mg Oral Daily  . feeding supplement  237 mL Oral Q24H  . filgrastim  480 mcg Subcutaneous Daily  . fluconazole  100 mg Oral Daily  . insulin aspart  0-15 Units Subcutaneous TID WC  . insulin glargine  25 Units Subcutaneous QPM  . metoprolol tartrate  25 mg Oral BID  . sodium chloride  3 mL Intravenous Q12H   Continuous Infusions:   PRN Meds:.acetaminophen, acetaminophen, cyclobenzaprine, ondansetron (ZOFRAN) IV, ondansetron, oxyCODONE, sodium chloride    LOS: 3 days   Keith Duffy,CHRISTOPHER  [facility] Pager [phone]. If 8PM-8AM, please contact night-coverage at [email or web address], password  [email or web address] 09/04/2012, 12:16 PM  LOS: 3 days

## 2012-09-04 NOTE — Consult Note (Signed)
NAME:  Keith Duffy, Keith Duffy NO.:  [id number]  MEDICAL RECORD NO.:  [id number]  LOCATION:  1430                         FACILITY:  [facility]  PHYSICIAN:  [name], M.D.  DATE OF BIRTH:  10-Apr-1945  DATE OF CONSULTATION: DATE OF DISCHARGE:                                CONSULTATION   REFERRING PHYSICIAN:  Ricke Hey.  REASON FOR CONSULTATION: 1. Diffuse large cell non-Hodgkin's lymphoma. 2. Febrile neutropenia.  HISTORY OF PRESENT ILLNESS:  Keith Duffy is a very nice 67 year old gentleman.  I initially saw him on July 4th.  He had a history of past localized non-Hodgkin's lymphoma back in 1995.  He subsequently presented with 40-pound weight loss.  He had some sweats.  He is having bony pain.  He was found on initial evaluation to have retroperitoneal lymph node.  He had extensive osseous metastasis.  He subsequently underwent an extensive workup.  The workup showed he had a high-grade non-Hodgkin's lymphoma.  His LDH was incredibly high.  We could not get a PET scan as he was in the hospital.  He was treated with chemotherapy.  He got a chemotherapy on the night. Again, he tolerated this fairly well.  He does have some cardiomyopathy.  The etiology of this is unclear.  I did give him his Zinecard to help with cardioprotection from the Adriamycin.  Apparently got 2 units of blood prior to discharge.  He subsequently was discharged.  Of note, he did get Neupogen in the hospital.  This was not continued as an outpatient.  He had his diabetes.  His blood sugar has been very high.  He subsequently was readmitted, I think on the 15th.  At that point in time, his white cell count of 0.3.  Hemoglobin was 9.5 and platelet count 100,000.  He had a chest x-ray done.  Chest x-ray was negative.  He had cultures done.  Cultures so far have all been negative.  He was placed on empiric antibiotics with vancomycin and cefepime.  He started Neupogen yesterday.  His  blood pressures had been quite low. He was admitted to the monitored unit.  His blood pressure has clearly have responded.  I have seen him and try to help him move his hospitalization long more quickly and try to upright additional management recommendations.  PAST MEDICAL HISTORY: 1. Remarkable for localized non-Hodgkin's lymphoma. 2. Hypertension. 3. Diabetes.  ALLERGIES:  Codeine.  ADMISSION MEDICATIONS:  Allopurinol, Flexeril, Famvir, insulin, metoprolol, Roxicodone, Bactrim.  SOCIAL HISTORY:  Negative for tobacco use.  There is no alcohol use.  PHYSICAL EXAMINATION:  General, this is a fairly well-developed, well- nourished, white gentleman, in no obvious distress.  Vital signs, temperature of 98.7, pulse 101, respiratory rate 20, blood pressure 119/65.  Head and neck exam shows no ocular or oral lesions.  There are no palpable cervical or supraclavicular lymph nodes.  Lungs are clear to percussion and auscultation bilaterally.  Cardiac, regular rate and rhythm with normal S1, S2.  There are no murmurs, rubs, or bruits. Abdominal exam, soft with good bowel sounds.  There is no palpable abdominal mass.  There is no palpable hepatosplenomegaly.  Extremities show no clubbing, cyanosis, or  edema.  Neurological exam shows no focal neurological deficits.  Skin, no rashes, ecchymosis, or petechia.  LABORATORY STUDIES:  White cell count of 0.9.  Hemoglobin 9.5, hematocrit 29.4, platelet count 105.  His electrolytes were unremarkable.  LDH has not been done.  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  This was biopsied from his retroperitoneum.  __________ chemotherapy.  He is neutropenic.  This, however, is getting better.  I suspect that his white cell count will continue to improve.  Cultures were all negative.  I think that we can just watch for right now.  Hopefully, if he stabilizes overnight, his white cell count  continues to improve, then he can be discharged in a day or so.  I do not see, need for transfuse him right now.  We will continue to check his CBC daily.  I do want to send off to [facility] on him.  Hopefully, it will be less than a 1000 at this point.  I think, we can apply getting him off the allopurinol.  We will follow him along for right now.  I do appreciate the hospitalist helping out.     [name], M.D.     PRE/MEDQ  D:  09/03/2012  T:  09/04/2012  Job:  [id number]

## 2012-09-05 LAB — CBC WITH DIFFERENTIAL/PLATELET
Basophils Relative: 3 % — ABNORMAL HIGH (ref 0–1)
Eosinophils Relative: 1 % (ref 0–5)
HCT: 28.4 % — ABNORMAL LOW (ref 39.0–52.0)
Hemoglobin: 9.3 g/dL — ABNORMAL LOW (ref 13.0–17.0)
Lymphocytes Relative: 29 % (ref 12–46)
MCH: 27 pg (ref 26.0–34.0)
MCHC: 32.7 g/dL (ref 30.0–36.0)
Neutrophils Relative %: 55 % (ref 43–77)
RBC: 3.44 MIL/uL — ABNORMAL LOW (ref 4.22–5.81)
nRBC: 12 /100 WBC — ABNORMAL HIGH

## 2012-09-05 LAB — LACTATE DEHYDROGENASE: LDH: 582 U/L — ABNORMAL HIGH (ref 94–250)

## 2012-09-05 LAB — BASIC METABOLIC PANEL
BUN: 11 mg/dL (ref 6–23)
CO2: 24 mEq/L (ref 19–32)
Chloride: 102 mEq/L (ref 96–112)
Creatinine, Ser: 0.68 mg/dL (ref 0.50–1.35)
GFR calc Af Amer: >90 mL/min (ref >90)
Potassium: 3.4 mEq/L — ABNORMAL LOW (ref 3.5–5.1)

## 2012-09-05 LAB — GLUCOSE, CAPILLARY: Glucose-Capillary: 84 mg/dL (ref 70–99)

## 2012-09-05 MED ORDER — FLUCONAZOLE 100 MG PO TABS: 100.0000 mg | ORAL_TABLET | Freq: Every day | ORAL | Status: DC

## 2012-09-05 MED ORDER — FILGRASTIM 480 MCG/1.6ML IJ SOLN
480.0000 ug | Freq: Once | INTRAMUSCULAR | Status: AC
  Administered 2012-09-05: 480 ug via SUBCUTANEOUS
  Filled 2012-09-05: qty 1.6

## 2012-09-05 MED ORDER — INSULIN GLARGINE 100 UNIT/ML SOLOSTAR PEN: 25.0000 [IU] | PEN_INJECTOR | Freq: Every evening | SUBCUTANEOUS | Status: DC

## 2012-09-05 MED ORDER — POTASSIUM CHLORIDE CRYS ER 20 MEQ PO TBCR
40.0000 meq | EXTENDED_RELEASE_TABLET | Freq: Once | ORAL | Status: AC
  Administered 2012-09-05: 40 meq via ORAL
  Filled 2012-09-05: qty 2

## 2012-09-05 NOTE — Progress Notes (Signed)
[name] is doing well this morning. He is eating breakfast and doing okay with this. He's had no fever. He's had no nausea vomiting. There's been no cough.  He got potassium yesterday. Today, potassium 3.4.  He is on his Neupogen. His white cell count 1.9 today. He will get his dose of Neupogen today.  Hemoglobin is 9.3 and his platelet count is 102. These are improving.  His LDH is 582. This is likely a little higher because of the Neupogen effect.  His blood cultures are negative. He's on Maxipime. I suspect he probably can stop this now.  His vital signs on physical exam are stable. Blood pressure is 135/81. Temperature is 99.1. There is no change in his physical exam. Lungs are clear. Cardiac exam regular rate and rhythm. Abdomen soft. Extremities shows no clubbing cyanosis or edema. No focal neurological deficits.  He's ready to go home today in my opinion. I gave him a prescription for lab work to be done at home on Monday. I will have those results faxed to me.  He will see me in the office next Thursday.  I would make sure that he is on the Famvir, and Diflucan upon going home. He does not need oral antibiotic.  I greatly appreciate the help of the hospitalists and the staff on 4 W.  Lawerance Sabal 1:5-7

## 2012-09-07 LAB — CHROMOSOME ANALYSIS, BONE MARROW

## 2012-09-08 LAB — CULTURE, BLOOD (ROUTINE X 2)

## 2012-09-10 ENCOUNTER — Encounter: Payer: Self-pay | Admitting: Hematology & Oncology

## 2012-09-10 ENCOUNTER — Ambulatory Visit: Payer: Medicare Other

## 2012-09-10 ENCOUNTER — Other Ambulatory Visit: Payer: Self-pay | Admitting: Nurse Practitioner

## 2012-09-10 ENCOUNTER — Other Ambulatory Visit (HOSPITAL_BASED_OUTPATIENT_CLINIC_OR_DEPARTMENT_OTHER): Payer: Medicare Other | Admitting: Lab

## 2012-09-10 ENCOUNTER — Telehealth: Payer: Self-pay | Admitting: Hematology & Oncology

## 2012-09-10 ENCOUNTER — Ambulatory Visit (HOSPITAL_BASED_OUTPATIENT_CLINIC_OR_DEPARTMENT_OTHER): Payer: Medicare Other | Admitting: Hematology & Oncology

## 2012-09-10 VITALS — BP 110/62 | HR 101 | Temp 97.8°F | Resp 18 | Ht 69.0 in | Wt 162.0 lb

## 2012-09-10 DIAGNOSIS — C8590 Non-Hodgkin lymphoma, unspecified, unspecified site: Secondary | ICD-10-CM

## 2012-09-10 DIAGNOSIS — C8589 Other specified types of non-Hodgkin lymphoma, extranodal and solid organ sites: Secondary | ICD-10-CM

## 2012-09-10 LAB — CMP (CANCER CENTER ONLY)
ALT(SGPT): 35 U/L (ref 10–47)
Alkaline Phosphatase: 176 U/L — ABNORMAL HIGH (ref 26–84)
CO2: 28 mEq/L (ref 18–33)
Creat: 0.7 mg/dl (ref 0.6–1.2)
Sodium: 141 mEq/L (ref 128–145)
Total Bilirubin: 0.6 mg/dl (ref 0.20–1.60)

## 2012-09-10 LAB — CBC WITH DIFFERENTIAL (CANCER CENTER ONLY)
BASO%: 0.6 % (ref 0.0–2.0)
EOS%: 0.9 % (ref 0.0–7.0)
LYMPH%: 10.1 % — ABNORMAL LOW (ref 14.0–48.0)
MCH: 27.5 pg — ABNORMAL LOW (ref 28.0–33.4)
MCHC: 31.5 g/dL — ABNORMAL LOW (ref 32.0–35.9)
MCV: 87 fL (ref 82–98)
MONO%: 8.8 % (ref 0.0–13.0)
NEUT#: 5.4 10*3/uL (ref 1.5–6.5)
Platelets: 106 10*3/uL — ABNORMAL LOW (ref 145–400)
RDW: 15.6 % (ref 11.1–15.7)

## 2012-09-10 LAB — CHCC SATELLITE - SMEAR

## 2012-09-10 MED ORDER — LIDOCAINE-PRILOCAINE 2.5-2.5 % EX CREA: TOPICAL_CREAM | CUTANEOUS | Status: DC | PRN

## 2012-09-10 NOTE — Progress Notes (Signed)
This office note has been dictated.

## 2012-09-10 NOTE — Telephone Encounter (Signed)
Pt aware of 7-29 chemo education,7-30 chemo 7-31 inj and 8-14 PET and to be NPO 6 hrs prior and to get instruction sheet for insulin management before PET

## 2012-09-11 ENCOUNTER — Telehealth: Payer: Self-pay | Admitting: Cardiology

## 2012-09-11 NOTE — Telephone Encounter (Signed)
Keith Duffy

## 2012-09-11 NOTE — Progress Notes (Signed)
CC:   Luna Kitchens, MD  DIAGNOSIS:  Diffuse large-cell non-Hodgkin lymphoma (IPI score of 5).  INTERVAL HISTORY:  Keith Duffy comes in for followup.  He is really looking well.  He was first seen on July 4th.  At that point in time, he presented with a 40-pound weight loss.  He ha sweats.  Workup showed a retroperitoneal lymphadenopathy.  He had extensive bony metastases.  He has pancytopenia.  His LDH was over 5000.  He did undergo a lymph node biopsy.  This was done on July 7th.  The pathology report ([id number]) showed large B-cell lymphoma.  He had this to be found positive for Bcl-2.  He had a bone marrow biopsy done.  The report ([id number]) showed a hypercellular marrow with extensive B-cell lymphoma involvement.  Because he was an inpatient, we could not do a PET scan on him.  We went ahead and treated him with R-CHOP.  Of note, his echocardiogram showed an ejection fraction of 40%.  We went ahead and treated him with R-CHOP.  I used Zinecard as a cardioprotectant.  He did well with this.  He was discharged but ultimately to be readmitted because of I think fever.  He was slightly neutropenic.  This resolved quickly.  His performance status when I first saw him was ECOG 3.  Now he is ECOG 1.  His appetite is doing well.  His weight is going up.  He just feels much better overall.  PHYSICAL EXAMINATION:  General:  This is a well-developed, well- nourished white gentleman in no obvious distress.  Vital signs: Temperature of 97.8, pulse 101, respiratory rate 18, blood pressure 110/62.  Weight is 162.  Head and neck:  Normocephalic, atraumatic skull.  There are no ocular or oral lesions.  There are no palpable cervical or supraclavicular lymph nodes.  Lungs:  Clear bilaterally. Cardiac:  Regular rate and rhythm with a normal S1 and S2.  There are no murmurs, rubs or bruits.  Abdomen:  Soft with good bowel sounds.  There is no palpable abdominal mass.  There is no fluid  wave.  There is no palpable hepatosplenomegaly.  Extremities:  Show no clubbing, cyanosis or edema.  Neurological:  Shows no focal neurological deficits.  LABORATORY STUDIES:  White cell count is 6.8, hemoglobin 9.7, hematocrit 30.8, platelet count is 106.  LDH is 330.  IMPRESSION:  Keith Duffy is a 67 year old gentleman with a very aggressive large-cell lymphoma.  His IPI score was 5.  We will go ahead and give him a second cycle of chemotherapy next week. Again, we need to use the Zinecard to help as a cardioprotectant.  At this 2nd cycle, I will repeat his scans.  I will also repeat an echocardiogram on him.  His LDH has come down about 90%, which certainly is a good sign.  I am just glad to see that he is feeling better.  I will plan to see him back for his 3rd cycle of chemo.  He likely will need 8 cycles of treatment.    ______________________________ [name], M.D. PRE/MEDQ  D:  09/10/2012  T:  09/11/2012  Job:  [id number]

## 2012-09-14 ENCOUNTER — Telehealth: Payer: Self-pay | Admitting: Physician Assistant

## 2012-09-14 NOTE — Telephone Encounter (Signed)
I spoke to the patient's wife.  She does not think he is strong enough to have a myoview yet.  i will talk to her again next week to see how he is doing.after next chemo treatment.  Lavena Loretto 4:43 PM

## 2012-09-15 ENCOUNTER — Encounter: Payer: Self-pay | Admitting: *Deleted

## 2012-09-15 ENCOUNTER — Other Ambulatory Visit: Payer: Self-pay | Admitting: *Deleted

## 2012-09-15 ENCOUNTER — Other Ambulatory Visit: Payer: Medicare Other

## 2012-09-15 DIAGNOSIS — C8590 Non-Hodgkin lymphoma, unspecified, unspecified site: Secondary | ICD-10-CM

## 2012-09-15 DIAGNOSIS — E1165 Type 2 diabetes mellitus with hyperglycemia: Secondary | ICD-10-CM

## 2012-09-16 ENCOUNTER — Ambulatory Visit (HOSPITAL_BASED_OUTPATIENT_CLINIC_OR_DEPARTMENT_OTHER): Payer: Medicare Other | Admitting: Lab

## 2012-09-16 ENCOUNTER — Encounter: Payer: Self-pay | Admitting: Hematology & Oncology

## 2012-09-16 ENCOUNTER — Ambulatory Visit (HOSPITAL_BASED_OUTPATIENT_CLINIC_OR_DEPARTMENT_OTHER): Payer: Medicare Other

## 2012-09-16 ENCOUNTER — Other Ambulatory Visit: Payer: Self-pay | Admitting: *Deleted

## 2012-09-16 VITALS — BP 124/78 | HR 94 | Temp 97.0°F | Resp 18

## 2012-09-16 DIAGNOSIS — Z5112 Encounter for antineoplastic immunotherapy: Secondary | ICD-10-CM

## 2012-09-16 DIAGNOSIS — Z5111 Encounter for antineoplastic chemotherapy: Secondary | ICD-10-CM

## 2012-09-16 DIAGNOSIS — C799 Secondary malignant neoplasm of unspecified site: Secondary | ICD-10-CM

## 2012-09-16 DIAGNOSIS — C8590 Non-Hodgkin lymphoma, unspecified, unspecified site: Secondary | ICD-10-CM

## 2012-09-16 DIAGNOSIS — C8589 Other specified types of non-Hodgkin lymphoma, extranodal and solid organ sites: Secondary | ICD-10-CM

## 2012-09-16 DIAGNOSIS — E1165 Type 2 diabetes mellitus with hyperglycemia: Secondary | ICD-10-CM

## 2012-09-16 LAB — CBC WITH DIFFERENTIAL (CANCER CENTER ONLY)
BASO#: 0.1 10*3/uL (ref 0.0–0.2)
EOS%: 0.9 % (ref 0.0–7.0)
HCT: 30.6 % — ABNORMAL LOW (ref 38.7–49.9)
HGB: 9.5 g/dL — ABNORMAL LOW (ref 13.0–17.1)
LYMPH#: 0.7 10*3/uL — ABNORMAL LOW (ref 0.9–3.3)
LYMPH%: 12.6 % — ABNORMAL LOW (ref 14.0–48.0)
MCHC: 31 g/dL — ABNORMAL LOW (ref 32.0–35.9)
MCV: 90 fL (ref 82–98)
MONO#: 0.4 10*3/uL (ref 0.1–0.9)
NEUT%: 77.2 % (ref 40.0–80.0)

## 2012-09-16 LAB — BASIC METABOLIC PANEL - CANCER CENTER ONLY
BUN, Bld: 12 mg/dL (ref 7–22)
CO2: 27 mEq/L (ref 18–33)
Calcium: 9.5 mg/dL (ref 8.0–10.3)
Chloride: 104 mEq/L (ref 98–108)
Creat: 0.9 mg/dl (ref 0.6–1.2)

## 2012-09-16 MED ORDER — SODIUM CHLORIDE 0.9 % IV SOLN
Freq: Once | INTRAVENOUS | Status: AC
  Administered 2012-09-16: 09:00:00 via INTRAVENOUS

## 2012-09-16 MED ORDER — SODIUM CHLORIDE 0.9 % IJ SOLN
10.0000 mL | INTRAMUSCULAR | Status: DC | PRN
  Administered 2012-09-16: 10 mL
  Filled 2012-09-16: qty 10

## 2012-09-16 MED ORDER — ONDANSETRON 16 MG/50ML IVPB (CHCC)
16.0000 mg | Freq: Once | INTRAVENOUS | Status: AC
  Administered 2012-09-16: 16 mg via INTRAVENOUS

## 2012-09-16 MED ORDER — LACTATED RINGERS IV SOLN
500.0000 mg/m2 | Freq: Once | INTRAVENOUS | Status: AC
  Administered 2012-09-16: 970 mg via INTRAVENOUS
  Filled 2012-09-16: qty 97

## 2012-09-16 MED ORDER — VINCRISTINE SULFATE CHEMO INJECTION 1 MG/ML
2.0000 mg | Freq: Once | INTRAVENOUS | Status: AC
  Administered 2012-09-16: 2 mg via INTRAVENOUS
  Filled 2012-09-16: qty 2

## 2012-09-16 MED ORDER — HEPARIN SOD (PORK) LOCK FLUSH 100 UNIT/ML IV SOLN
500.0000 [IU] | Freq: Once | INTRAVENOUS | Status: AC | PRN
  Administered 2012-09-16: 500 [IU]
  Filled 2012-09-16: qty 5

## 2012-09-16 MED ORDER — ONDANSETRON HCL 8 MG PO TABS: 8.0000 mg | ORAL_TABLET | Freq: Two times a day (BID) | ORAL | Status: DC

## 2012-09-16 MED ORDER — DOXORUBICIN HCL CHEMO IV INJECTION 2 MG/ML
50.0000 mg/m2 | Freq: Once | INTRAVENOUS | Status: AC
  Administered 2012-09-16: 96 mg via INTRAVENOUS
  Filled 2012-09-16: qty 48

## 2012-09-16 MED ORDER — SODIUM CHLORIDE 0.9 % IV SOLN
375.0000 mg/m2 | Freq: Once | INTRAVENOUS | Status: AC
  Administered 2012-09-16: 700 mg via INTRAVENOUS
  Filled 2012-09-16: qty 70

## 2012-09-16 MED ORDER — PROCHLORPERAZINE MALEATE 10 MG PO TABS: 10.0000 mg | ORAL_TABLET | Freq: Four times a day (QID) | ORAL | Status: DC | PRN

## 2012-09-16 MED ORDER — DEXAMETHASONE SODIUM PHOSPHATE 20 MG/5ML IJ SOLN
20.0000 mg | Freq: Once | INTRAMUSCULAR | Status: AC
  Administered 2012-09-16: 20 mg via INTRAVENOUS
  Filled 2012-09-16: qty 5

## 2012-09-16 MED ORDER — SODIUM CHLORIDE 0.9 % IV SOLN
750.0000 mg/m2 | Freq: Once | INTRAVENOUS | Status: AC
  Administered 2012-09-16: 1440 mg via INTRAVENOUS
  Filled 2012-09-16: qty 72

## 2012-09-16 MED ORDER — ACETAMINOPHEN 325 MG PO TABS
650.0000 mg | ORAL_TABLET | Freq: Once | ORAL | Status: AC
  Administered 2012-09-16: 650 mg via ORAL

## 2012-09-16 MED ORDER — DIPHENHYDRAMINE HCL 25 MG PO CAPS
50.0000 mg | ORAL_CAPSULE | Freq: Once | ORAL | Status: AC
  Administered 2012-09-16: 50 mg via ORAL

## 2012-09-16 NOTE — Patient Instructions (Addendum)
ake home Nausea Medications:  1) Zofran (Ondansetron) 8 mg.  Take this beginning 09/17/12 in the am.  Take one tablet in the pm.  Take this for 3 days after chemotherapy.   2) Compazine (Prochloperazine) 10 mg take one tablet by mouth every 6 hours as needed for nause.        [facility] Discharge Instructions for Patients Receiving Chemotherapy  Today you received the following chemotherapy agents   To help prevent nausea and vomiting after your treatment, we encourage you to take your nausea medication    If you develop nausea and vomiting that is not controlled by your nausea medication, call the clinic.   BELOW ARE SYMPTOMS THAT SHOULD BE REPORTED IMMEDIATELY:  *FEVER GREATER THAN 100.5 F  *CHILLS WITH OR WITHOUT FEVER  NAUSEA AND VOMITING THAT IS NOT CONTROLLED WITH YOUR NAUSEA MEDICATION  *UNUSUAL SHORTNESS OF BREATH  *UNUSUAL BRUISING OR BLEEDING  TENDERNESS IN MOUTH AND THROAT WITH OR WITHOUT PRESENCE OF ULCERS  *URINARY PROBLEMS  *BOWEL PROBLEMS  UNUSUAL RASH Items with * indicate a potential emergency and should be followed up as soon as possible.  Feel free to call the clinic you have any questions or concerns. The clinic phone number is [phone].

## 2012-09-17 ENCOUNTER — Ambulatory Visit (HOSPITAL_BASED_OUTPATIENT_CLINIC_OR_DEPARTMENT_OTHER): Payer: Medicare Other

## 2012-09-17 VITALS — BP 148/82 | HR 106 | Temp 98.1°F | Resp 20

## 2012-09-17 DIAGNOSIS — Z5189 Encounter for other specified aftercare: Secondary | ICD-10-CM

## 2012-09-17 DIAGNOSIS — C8589 Other specified types of non-Hodgkin lymphoma, extranodal and solid organ sites: Secondary | ICD-10-CM

## 2012-09-17 DIAGNOSIS — C799 Secondary malignant neoplasm of unspecified site: Secondary | ICD-10-CM

## 2012-09-17 MED ORDER — PEGFILGRASTIM INJECTION 6 MG/0.6ML
6.0000 mg | Freq: Once | SUBCUTANEOUS | Status: AC
  Administered 2012-09-17: 6 mg via SUBCUTANEOUS

## 2012-09-17 NOTE — Patient Instructions (Signed)

## 2012-09-23 ENCOUNTER — Other Ambulatory Visit: Payer: Self-pay

## 2012-10-01 ENCOUNTER — Encounter (HOSPITAL_COMMUNITY)
Admission: RE | Admit: 2012-10-01 | Discharge: 2012-10-01 | Disposition: A | Discharge status: Home / Self Care | Payer: Medicare Other | Source: Ambulatory Visit | Attending: Hematology & Oncology | Admitting: Hematology & Oncology

## 2012-10-01 ENCOUNTER — Ambulatory Visit (HOSPITAL_COMMUNITY)
Admission: RE | Admit: 2012-10-01 | Discharge: 2012-10-01 | Disposition: A | Discharge status: Home / Self Care | Payer: Medicare Other | Source: Ambulatory Visit | Attending: Hematology & Oncology | Admitting: Hematology & Oncology

## 2012-10-01 DIAGNOSIS — C8590 Non-Hodgkin lymphoma, unspecified, unspecified site: Secondary | ICD-10-CM

## 2012-10-01 DIAGNOSIS — K7689 Other specified diseases of liver: Secondary | ICD-10-CM | POA: Insufficient documentation

## 2012-10-01 DIAGNOSIS — C8589 Other specified types of non-Hodgkin lymphoma, extranodal and solid organ sites: Secondary | ICD-10-CM | POA: Insufficient documentation

## 2012-10-01 DIAGNOSIS — Z09 Encounter for follow-up examination after completed treatment for conditions other than malignant neoplasm: Secondary | ICD-10-CM

## 2012-10-01 DIAGNOSIS — Z79899 Other long term (current) drug therapy: Secondary | ICD-10-CM | POA: Insufficient documentation

## 2012-10-01 DIAGNOSIS — Z9221 Personal history of antineoplastic chemotherapy: Secondary | ICD-10-CM | POA: Insufficient documentation

## 2012-10-01 MED ORDER — FLUDEOXYGLUCOSE F - 18 (FDG) INJECTION
16.2000 | Freq: Once | INTRAVENOUS | Status: AC | PRN
  Administered 2012-10-01: 16.2 via INTRAVENOUS

## 2012-10-01 NOTE — Progress Notes (Signed)
  Echocardiogram 2D Echocardiogram has been performed.  Arvil Chaco 10/01/2012, 1:07 PM

## 2012-10-02 LAB — GLUCOSE, CAPILLARY: Glucose-Capillary: 155 mg/dL — ABNORMAL HIGH (ref 70–99)

## 2012-10-08 ENCOUNTER — Ambulatory Visit (HOSPITAL_BASED_OUTPATIENT_CLINIC_OR_DEPARTMENT_OTHER): Payer: Medicare Other | Admitting: Hematology & Oncology

## 2012-10-08 ENCOUNTER — Ambulatory Visit (HOSPITAL_BASED_OUTPATIENT_CLINIC_OR_DEPARTMENT_OTHER): Payer: Medicare Other

## 2012-10-08 ENCOUNTER — Other Ambulatory Visit (HOSPITAL_BASED_OUTPATIENT_CLINIC_OR_DEPARTMENT_OTHER): Payer: Medicare Other | Admitting: Lab

## 2012-10-08 VITALS — BP 134/59 | HR 96 | Temp 98.2°F | Resp 18 | Ht 69.0 in | Wt 174.0 lb

## 2012-10-08 VITALS — BP 123/76 | HR 85 | Temp 97.6°F | Resp 18

## 2012-10-08 DIAGNOSIS — C8589 Other specified types of non-Hodgkin lymphoma, extranodal and solid organ sites: Secondary | ICD-10-CM

## 2012-10-08 DIAGNOSIS — Z5112 Encounter for antineoplastic immunotherapy: Secondary | ICD-10-CM

## 2012-10-08 DIAGNOSIS — D649 Anemia, unspecified: Secondary | ICD-10-CM

## 2012-10-08 DIAGNOSIS — C799 Secondary malignant neoplasm of unspecified site: Secondary | ICD-10-CM

## 2012-10-08 DIAGNOSIS — C8590 Non-Hodgkin lymphoma, unspecified, unspecified site: Secondary | ICD-10-CM

## 2012-10-08 DIAGNOSIS — Z5111 Encounter for antineoplastic chemotherapy: Secondary | ICD-10-CM

## 2012-10-08 LAB — CBC WITH DIFFERENTIAL (CANCER CENTER ONLY)
BASO%: 0.6 % (ref 0.0–2.0)
EOS%: 0.5 % (ref 0.0–7.0)
HCT: 26.6 % — ABNORMAL LOW (ref 38.7–49.9)
LYMPH#: 0.7 10*3/uL — ABNORMAL LOW (ref 0.9–3.3)
MONO#: 0.7 10*3/uL (ref 0.1–0.9)
NEUT#: 9.4 10*3/uL — ABNORMAL HIGH (ref 1.5–6.5)
NEUT%: 86.3 % — ABNORMAL HIGH (ref 40.0–80.0)
Platelets: 261 10*3/uL (ref 145–400)
RDW: 25.5 % — ABNORMAL HIGH (ref 11.1–15.7)
WBC: 10.9 10*3/uL — ABNORMAL HIGH (ref 4.0–10.0)

## 2012-10-08 LAB — CMP (CANCER CENTER ONLY)
ALT(SGPT): 16 U/L (ref 10–47)
AST: 22 U/L (ref 11–38)
Albumin: 3.2 g/dL — ABNORMAL LOW (ref 3.3–5.5)
Alkaline Phosphatase: 142 U/L — ABNORMAL HIGH (ref 26–84)
Calcium: 9.3 mg/dL (ref 8.0–10.3)
Chloride: 104 mEq/L (ref 98–108)
Potassium: 4.4 mEq/L (ref 3.3–4.7)
Sodium: 139 mEq/L (ref 128–145)

## 2012-10-08 LAB — LACTATE DEHYDROGENASE: LDH: 180 U/L (ref 94–250)

## 2012-10-08 MED ORDER — SODIUM CHLORIDE 0.9 % IV SOLN
Freq: Once | INTRAVENOUS | Status: AC
  Administered 2012-10-08: 09:00:00 via INTRAVENOUS

## 2012-10-08 MED ORDER — SODIUM CHLORIDE 0.9 % IV SOLN
375.0000 mg/m2 | Freq: Once | INTRAVENOUS | Status: AC
  Administered 2012-10-08: 700 mg via INTRAVENOUS
  Filled 2012-10-08: qty 70

## 2012-10-08 MED ORDER — HEPARIN SOD (PORK) LOCK FLUSH 100 UNIT/ML IV SOLN
500.0000 [IU] | Freq: Once | INTRAVENOUS | Status: AC | PRN
  Administered 2012-10-08: 500 [IU]
  Filled 2012-10-08: qty 5

## 2012-10-08 MED ORDER — DARBEPOETIN ALFA-POLYSORBATE 300 MCG/0.6ML IJ SOLN
300.0000 ug | Freq: Once | INTRAMUSCULAR | Status: AC
  Administered 2012-10-08: 300 ug via SUBCUTANEOUS

## 2012-10-08 MED ORDER — ONDANSETRON 16 MG/50ML IVPB (CHCC)
16.0000 mg | Freq: Once | INTRAVENOUS | Status: AC
  Administered 2012-10-08: 16 mg via INTRAVENOUS

## 2012-10-08 MED ORDER — DIPHENHYDRAMINE HCL 25 MG PO CAPS
50.0000 mg | ORAL_CAPSULE | Freq: Once | ORAL | Status: AC
  Administered 2012-10-08: 50 mg via ORAL

## 2012-10-08 MED ORDER — DEXAMETHASONE SODIUM PHOSPHATE 20 MG/5ML IJ SOLN
20.0000 mg | Freq: Once | INTRAMUSCULAR | Status: AC
  Administered 2012-10-08: 20 mg via INTRAVENOUS
  Filled 2012-10-08: qty 5

## 2012-10-08 MED ORDER — ACETAMINOPHEN 325 MG PO TABS
650.0000 mg | ORAL_TABLET | Freq: Once | ORAL | Status: AC
  Administered 2012-10-08: 650 mg via ORAL

## 2012-10-08 MED ORDER — VINCRISTINE SULFATE CHEMO INJECTION 1 MG/ML
2.0000 mg | Freq: Once | INTRAVENOUS | Status: AC
  Administered 2012-10-08: 2 mg via INTRAVENOUS
  Filled 2012-10-08: qty 2

## 2012-10-08 MED ORDER — SODIUM CHLORIDE 0.9 % IJ SOLN
10.0000 mL | INTRAMUSCULAR | Status: DC | PRN
  Administered 2012-10-08: 10 mL
  Filled 2012-10-08: qty 10

## 2012-10-08 MED ORDER — SODIUM CHLORIDE 0.9 % IV SOLN
750.0000 mg/m2 | Freq: Once | INTRAVENOUS | Status: AC
  Administered 2012-10-08: 1440 mg via INTRAVENOUS
  Filled 2012-10-08: qty 72

## 2012-10-08 MED ORDER — DOXORUBICIN HCL CHEMO IV INJECTION 2 MG/ML
50.0000 mg/m2 | Freq: Once | INTRAVENOUS | Status: AC
  Administered 2012-10-08: 96 mg via INTRAVENOUS
  Filled 2012-10-08: qty 48

## 2012-10-08 NOTE — Progress Notes (Signed)
This office note has been dictated.

## 2012-10-08 NOTE — Patient Instructions (Addendum)
[facility] Discharge Instructions for Patients Receiving Chemotherapy  Today you received the following chemotherapy agents Rituxan, Adriamycin, Vincristine, Cytoxan.   To help prevent nausea and vomiting after your treatment, we encourage you to take your nausea medication as prescribed.    If you develop nausea and vomiting that is not controlled by your nausea medication, call the clinic.   BELOW ARE SYMPTOMS THAT SHOULD BE REPORTED IMMEDIATELY:  *FEVER GREATER THAN 100.5 F  *CHILLS WITH OR WITHOUT FEVER  NAUSEA AND VOMITING THAT IS NOT CONTROLLED WITH YOUR NAUSEA MEDICATION  *UNUSUAL SHORTNESS OF BREATH  *UNUSUAL BRUISING OR BLEEDING  TENDERNESS IN MOUTH AND THROAT WITH OR WITHOUT PRESENCE OF ULCERS  *URINARY PROBLEMS  *BOWEL PROBLEMS  UNUSUAL RASH Items with * indicate a potential emergency and should be followed up as soon as possible.  Feel free to call the clinic you have any questions or concerns. The clinic phone number is [phone].    

## 2012-10-09 ENCOUNTER — Ambulatory Visit (HOSPITAL_BASED_OUTPATIENT_CLINIC_OR_DEPARTMENT_OTHER): Payer: Medicare Other

## 2012-10-09 VITALS — BP 142/70 | HR 85 | Temp 96.8°F

## 2012-10-09 DIAGNOSIS — C8589 Other specified types of non-Hodgkin lymphoma, extranodal and solid organ sites: Secondary | ICD-10-CM

## 2012-10-09 DIAGNOSIS — C799 Secondary malignant neoplasm of unspecified site: Secondary | ICD-10-CM

## 2012-10-09 MED ORDER — PEGFILGRASTIM INJECTION 6 MG/0.6ML
6.0000 mg | Freq: Once | SUBCUTANEOUS | Status: AC
  Administered 2012-10-09: 6 mg via SUBCUTANEOUS

## 2012-10-09 NOTE — Progress Notes (Signed)
CC:   Luna Kitchens, MD  DIAGNOSIS:  Diffuse large cell non-Hodgkin lymphoma (IPI score of 5).  CURRENT THERAPY:  Patient status post 2 cycles of R-CHOP.  INTERIM HISTORY:  Keith Duffy comes in for followup.  He is looking better.  He has gained quite a bit of weight when we first started seeing him.  We did go ahead and repeat a PET scan on him.  This was done on August 14th.  The PET scan basically showed a partial response with mild metabolic activity noted in the left periaortic lymph node.  There was some mild hypermetabolic marrow activity.  No activity is noted within the liver.  We also repeated an echocardiogram on him.  When I first saw him, his ejection fraction was about 40%.  As such, we had given him Zinecard.  I repeated the echocardiogram.  His ejection fraction is now 60% to 65%. As such, I think we can probably hold on the Zinecard.  He has a good appetite.  He has had no nausea and vomiting.  His wife is watching his blood sugars very closely.  He is having no problems with pain.  He still has some weakness in his legs.  He is trying to work, going downstairs to get some strength back.  He has had no diarrhea.  He has had a little constipation.  He has had no cough.  He has had no dysphagia or odynophagia.  PHYSICAL EXAMINATION:  General:  This is a well-developed, well- nourished white male in no obvious distress.  Vital signs: Temperature of 98.2, pulse 96, respiratory rate 18, blood pressure 134/59.  Weight is 174.  Head and neck:  Normocephalic, atraumatic skull.  There are no ocular or oral lesions.  There are no palpable cervical or supraclavicular lymph nodes.  Lungs:  Clear bilaterally. Cardiac:  Regular rate and rhythm with a normal S1 and S2.  There are no murmurs, rubs or bruits.  Abdomen:  Soft.  He has good bowel sounds. There is no fluid wave.  There is no palpable hepatosplenomegaly. Extremities:  No clubbing, cyanosis or edema.  Neurological:   No focal neurological deficits.  Skin:  No rashes, ecchymosis, or petechia.  LABORATORY STUDIES:  White cell count is 10.9, hemoglobin 8.2, hematocrit 26.6, platelet count __________.  IMPRESSION:  [name] is a 67 year old gentleman with diffuse large- cell non-Hodgkin lymphoma.  When we first saw him in the hospital, we could not do a PET scan because he was in the hospital.  As such, this is our 1st PET scan, but it does look like he has responded nicely.  I think that he might benefit from Aranesp.  I think that the anemia definitely is secondary to him having a significant marrow involvement.  We will go ahead and give him a dose of Aranesp today.  We will give him 300 mcg.  We will go ahead and treat him with full-dose chemotherapy.  We will plan for a followup PET scan after his 4th cycle.  I will have him back in 3 weeks.    ______________________________ Keith Duffy, M.D. PRE/MEDQ  D:  10/08/2012  T:  10/09/2012  Job:  [id number]

## 2012-10-09 NOTE — Progress Notes (Signed)
CC:   Luna Kitchens, MD  DIAGNOSIS:  Diffuse large cell non-Hodgkin lymphoma (IPI score of 5).  CURRENT THERAPY:  The patient is status post 2 cycles of R-CHOP.  INTERIM HISTORY:  Mr. Mavis comes in for followup.  He is doing fairly well.  He tolerated his first 2 cycles of chemotherapy quite well.  We did go ahead and repeat his PET scan.  Of course, we could do an initial PET scan on him because he was in the hospital.  His PET scan after 2 cycles showed that there was minimal residual disease.  He had mild activity in a left paraaortic lymph node.  There was some activity noted within the marrow spaces.  Outside of that, everything else looked okay.  When I first saw him, his LDH was over 5000.  This has come down quite nicely.  When we first saw him, his bone marrow was basically necrotic from rapidly growing lymphoma.  Again, he has turned around quite nicely.  He feels well.  He is gaining weight.  He is eating well.  He has a performance status now of ECOG 1.  His wife is watching his blood sugars.  He says that they are under typically good control.  PHYSICAL EXAMINATION:  General:  This is a fairly well-developed, well- nourished white gentleman in no obvious distress.  Vital signs:  Show a temperature of 98.2, pulse 96, respiratory rate 18, blood pressure 134/59.  Weight is 174.  Head and neck:  Show a normocephalic, atraumatic skull.  There are no ocular or oral lesions.  There are no palpable cervical or supraclavicular lymph nodes.  Lungs:  Clear bilaterally.  Cardiac:  Regular rate and rhythm, with a normal S1 and S2.  There are no murmurs, rubs, or bruits.  Abdomen:  Soft.  He has good bowel sounds.  There is no palpable abdominal mass.  There is no fluid wave.  There is no palpable hepatosplenomegaly.  Extremities: Show no clubbing, cyanosis, or edema.  Neurological:  Shows no focal neurological deficits.  LABORATORY STUDIES:  White cell count is 10.9,  hemoglobin 8.2, hematocrit 28.6.  Platelet count is 261.  His LDH is 180.  IMPRESSION:  Mr. Jarboe is a very nice 67 year old gentleman with diffuse large cell non-Hodgkin lymphoma.  His IPI score was 5 when I first saw him.  He had incredibly bad prognostic factors.  Again, he has improved incredibly well.  __________ that his recent echocardiogram showed an ejection fraction of 60% to 65%.  I think the fact that his LDH has normalized is certainly a very good factor.  Will go ahead with his 3rd cycle of chemotherapy.  I likely will plan for a PET scan after his 4th cycle.  I am just glad that he is feeling better.  Again, when I first saw him, he really looked miserable and was suffering from this lymphoma.    ______________________________ [name], M.D. PRE/MEDQ  D:  10/08/2012  T:  10/09/2012  Job:  [id number]

## 2012-10-09 NOTE — Patient Instructions (Signed)

## 2012-10-14 ENCOUNTER — Encounter: Payer: Self-pay | Admitting: Hematology & Oncology

## 2012-10-28 ENCOUNTER — Ambulatory Visit (HOSPITAL_BASED_OUTPATIENT_CLINIC_OR_DEPARTMENT_OTHER): Payer: Medicare Other

## 2012-10-28 ENCOUNTER — Other Ambulatory Visit (HOSPITAL_BASED_OUTPATIENT_CLINIC_OR_DEPARTMENT_OTHER): Payer: Medicare Other | Admitting: Lab

## 2012-10-28 ENCOUNTER — Ambulatory Visit (HOSPITAL_BASED_OUTPATIENT_CLINIC_OR_DEPARTMENT_OTHER): Payer: Medicare Other | Admitting: Hematology & Oncology

## 2012-10-28 VITALS — BP 152/83 | HR 91 | Temp 97.0°F | Resp 18

## 2012-10-28 VITALS — BP 123/55 | HR 88 | Temp 97.6°F | Resp 18 | Ht 69.0 in | Wt 181.0 lb

## 2012-10-28 DIAGNOSIS — Z5111 Encounter for antineoplastic chemotherapy: Secondary | ICD-10-CM

## 2012-10-28 DIAGNOSIS — C799 Secondary malignant neoplasm of unspecified site: Secondary | ICD-10-CM

## 2012-10-28 DIAGNOSIS — D649 Anemia, unspecified: Secondary | ICD-10-CM

## 2012-10-28 DIAGNOSIS — Z5112 Encounter for antineoplastic immunotherapy: Secondary | ICD-10-CM

## 2012-10-28 DIAGNOSIS — C8589 Other specified types of non-Hodgkin lymphoma, extranodal and solid organ sites: Secondary | ICD-10-CM

## 2012-10-28 DIAGNOSIS — C8590 Non-Hodgkin lymphoma, unspecified, unspecified site: Secondary | ICD-10-CM

## 2012-10-28 DIAGNOSIS — E119 Type 2 diabetes mellitus without complications: Secondary | ICD-10-CM

## 2012-10-28 LAB — CMP (CANCER CENTER ONLY)
AST: 17 U/L (ref 11–38)
Albumin: 3.4 g/dL (ref 3.3–5.5)
Alkaline Phosphatase: 120 U/L — ABNORMAL HIGH (ref 26–84)
BUN, Bld: 9 mg/dL (ref 7–22)
Glucose, Bld: 294 mg/dL — ABNORMAL HIGH (ref 73–118)
Potassium: 4.3 mEq/L (ref 3.3–4.7)
Sodium: 136 mEq/L (ref 128–145)
Total Bilirubin: 0.7 mg/dl (ref 0.20–1.60)

## 2012-10-28 LAB — CBC WITH DIFFERENTIAL (CANCER CENTER ONLY)
BASO#: 0.1 10*3/uL (ref 0.0–0.2)
BASO%: 0.6 % (ref 0.0–2.0)
EOS%: 0.3 % (ref 0.0–7.0)
LYMPH#: 0.5 10*3/uL — ABNORMAL LOW (ref 0.9–3.3)
MCH: 31.7 pg (ref 28.0–33.4)
MCHC: 30.8 g/dL — ABNORMAL LOW (ref 32.0–35.9)
MONO%: 9.7 % (ref 0.0–13.0)
NEUT#: 6.5 10*3/uL (ref 1.5–6.5)
NEUT%: 82.9 % — ABNORMAL HIGH (ref 40.0–80.0)
RDW: 20.2 % — ABNORMAL HIGH (ref 11.1–15.7)

## 2012-10-28 MED ORDER — SODIUM CHLORIDE 0.9 % IJ SOLN
10.0000 mL | INTRAMUSCULAR | Status: DC | PRN
  Administered 2012-10-28: 10 mL
  Filled 2012-10-28: qty 10

## 2012-10-28 MED ORDER — INSULIN REGULAR HUMAN 100 UNIT/ML IJ SOLN
15.0000 [IU] | Freq: Once | INTRAMUSCULAR | Status: AC
  Administered 2012-10-28: 15 [IU] via SUBCUTANEOUS

## 2012-10-28 MED ORDER — DEXAMETHASONE SODIUM PHOSPHATE 20 MG/5ML IJ SOLN
20.0000 mg | Freq: Once | INTRAMUSCULAR | Status: AC
  Administered 2012-10-28: 20 mg via INTRAVENOUS
  Filled 2012-10-28: qty 5

## 2012-10-28 MED ORDER — DARBEPOETIN ALFA-POLYSORBATE 300 MCG/0.6ML IJ SOLN
300.0000 ug | Freq: Once | INTRAMUSCULAR | Status: AC
  Administered 2012-10-28: 300 ug via SUBCUTANEOUS

## 2012-10-28 MED ORDER — ACETAMINOPHEN 325 MG PO TABS
650.0000 mg | ORAL_TABLET | Freq: Once | ORAL | Status: AC
  Administered 2012-10-28: 650 mg via ORAL

## 2012-10-28 MED ORDER — SODIUM CHLORIDE 0.9 % IV SOLN
750.0000 mg/m2 | Freq: Once | INTRAVENOUS | Status: AC
  Administered 2012-10-28: 1440 mg via INTRAVENOUS
  Filled 2012-10-28: qty 72

## 2012-10-28 MED ORDER — SODIUM CHLORIDE 0.9 % IV SOLN
Freq: Once | INTRAVENOUS | Status: AC
  Administered 2012-10-28: 10:00:00 via INTRAVENOUS

## 2012-10-28 MED ORDER — DIPHENHYDRAMINE HCL 25 MG PO CAPS
50.0000 mg | ORAL_CAPSULE | Freq: Once | ORAL | Status: AC
  Administered 2012-10-28: 50 mg via ORAL

## 2012-10-28 MED ORDER — DOXORUBICIN HCL CHEMO IV INJECTION 2 MG/ML
50.0000 mg/m2 | Freq: Once | INTRAVENOUS | Status: AC
  Administered 2012-10-28: 96 mg via INTRAVENOUS
  Filled 2012-10-28: qty 48

## 2012-10-28 MED ORDER — ONDANSETRON 16 MG/50ML IVPB (CHCC)
16.0000 mg | Freq: Once | INTRAVENOUS | Status: AC
  Administered 2012-10-28: 16 mg via INTRAVENOUS

## 2012-10-28 MED ORDER — SODIUM CHLORIDE 0.9 % IV SOLN
375.0000 mg/m2 | Freq: Once | INTRAVENOUS | Status: AC
  Administered 2012-10-28: 700 mg via INTRAVENOUS
  Filled 2012-10-28: qty 70

## 2012-10-28 MED ORDER — VINCRISTINE SULFATE CHEMO INJECTION 1 MG/ML
2.0000 mg | Freq: Once | INTRAVENOUS | Status: AC
  Administered 2012-10-28: 2 mg via INTRAVENOUS
  Filled 2012-10-28: qty 2

## 2012-10-28 MED ORDER — HEPARIN SOD (PORK) LOCK FLUSH 100 UNIT/ML IV SOLN
500.0000 [IU] | Freq: Once | INTRAVENOUS | Status: AC | PRN
  Administered 2012-10-28: 500 [IU]
  Filled 2012-10-28: qty 5

## 2012-10-28 NOTE — Progress Notes (Signed)
This office note has been dictated.

## 2012-10-28 NOTE — Patient Instructions (Signed)
[facility] Discharge Instructions for Patients Receiving Chemotherapy  Today you received the following chemotherapy agents Rituxan, Adriamycin, Vincristine, Cytoxan.   To help prevent nausea and vomiting after your treatment, we encourage you to take your nausea medication as prescribed.    If you develop nausea and vomiting that is not controlled by your nausea medication, call the clinic.   BELOW ARE SYMPTOMS THAT SHOULD BE REPORTED IMMEDIATELY:  *FEVER GREATER THAN 100.5 F  *CHILLS WITH OR WITHOUT FEVER  NAUSEA AND VOMITING THAT IS NOT CONTROLLED WITH YOUR NAUSEA MEDICATION  *UNUSUAL SHORTNESS OF BREATH  *UNUSUAL BRUISING OR BLEEDING  TENDERNESS IN MOUTH AND THROAT WITH OR WITHOUT PRESENCE OF ULCERS  *URINARY PROBLEMS  *BOWEL PROBLEMS  UNUSUAL RASH Items with * indicate a potential emergency and should be followed up as soon as possible.  Feel free to call the clinic you have any questions or concerns. The clinic phone number is [phone].

## 2012-10-29 ENCOUNTER — Ambulatory Visit (HOSPITAL_BASED_OUTPATIENT_CLINIC_OR_DEPARTMENT_OTHER): Payer: Medicare Other

## 2012-10-29 VITALS — BP 148/75 | HR 84 | Temp 96.8°F | Resp 18

## 2012-10-29 DIAGNOSIS — C8589 Other specified types of non-Hodgkin lymphoma, extranodal and solid organ sites: Secondary | ICD-10-CM

## 2012-10-29 DIAGNOSIS — C799 Secondary malignant neoplasm of unspecified site: Secondary | ICD-10-CM

## 2012-10-29 DIAGNOSIS — Z5189 Encounter for other specified aftercare: Secondary | ICD-10-CM

## 2012-10-29 MED ORDER — PEGFILGRASTIM INJECTION 6 MG/0.6ML
6.0000 mg | Freq: Once | SUBCUTANEOUS | Status: AC
  Administered 2012-10-29: 6 mg via SUBCUTANEOUS

## 2012-10-29 NOTE — Progress Notes (Signed)
CC:   Keith Kitchens, MD  DIAGNOSIS:  Diffuse large cell non-Hodgkin lymphoma (IPI Score of 5).  CURRENT THERAPY:  Patient is status post 3 cycles of R-CHOP.  INTERIM HISTORY:  Keith Duffy comes in for followup.  He is doing better.  He continues to improve every time I see him.  His weight is now up to 180 pounds.  He has done well with the chemotherapy.  He has had no problems with nausea or vomiting.  He has had some fatigue.  There has been a little constipation after chemo, but this resolves with laxatives.  He has had no problems with rashes.  There has been no leg swelling.  He does have diabetes.  His blood sugars have been on the high side.  With Keith Duffy, his LDH is typically a good marker for his disease response.  When we first started his treatment, his LDH was over 5000. With his last cycle, it was down to 180.  Overall, his performance status is ECOG 0.  PHYSICAL EXAMINATION:  General:  This is a well-developed, well- nourished white gentleman in no obvious distress.  Vital signs: Temperature of 97.6, pulse 88, respiratory rate 18, blood pressure 123/55.  Weight is 181.  Head and neck:  Normocephalic, atraumatic skull.  There are no ocular or oral lesions.  There are no palpable cervical or supraclavicular lymph nodes.  Lungs:  Clear bilaterally. Cardiac:  Regular rate and rhythm with a normal S1 and S2.  There are no murmurs, rubs or bruits.  Abdomen:  Soft.  He has good bowel sounds. There is no fluid wave.  There is no palpable hepatosplenomegaly. Extremities.  No clubbing, cyanosis or edema.  Neurological:  No focal neurological deficits.  LABORATORY STUDIES:  White cell count 7.8, hemoglobin 9.3, hematocrit 30.2, platelet count 215.  Sodium 136, potassium 4.3, BUN 9, creatinine 0.7.  Glucose is 294.  IMPRESSION:  Keith Duffy is a 67 year old gentleman with a history of diffuse large cell non-Hodgkin lymphoma.  He has a very high IPI score of  5.  Again, his response has been very nice to date.  We will go ahead with his 4th cycle of chemotherapy.  I will go ahead and plan for a followup PET scan after this cycle of chemotherapy to see how we are doing with response.  At some point, I will have to do a bone marrow biopsy on him.  When we first saw him, his bone marrow was basically necrotic because of rapidly progressive lymphoma.  One would have to think that this is improving now.  There is still some degree of anemia.  I will hold off on any kind of ESA administration for right now.  We will plan to get back in 3 weeks.  Again, we will get a PET scan on him before his next cycle.  I think with Keith Duffy, we may seriously consider getting him straight to transplant if we get him into remission.    ______________________________ [name], M.D. PRE/MEDQ  D:  10/28/2012  T:  10/29/2012  Job:  [id number]

## 2012-11-13 ENCOUNTER — Encounter (HOSPITAL_COMMUNITY)
Admission: RE | Admit: 2012-11-13 | Discharge: 2012-11-13 | Disposition: A | Discharge status: Home / Self Care | Payer: Medicare Other | Source: Ambulatory Visit | Attending: Hematology & Oncology | Admitting: Hematology & Oncology

## 2012-11-13 ENCOUNTER — Encounter (HOSPITAL_COMMUNITY): Payer: Self-pay

## 2012-11-13 DIAGNOSIS — N3289 Other specified disorders of bladder: Secondary | ICD-10-CM | POA: Insufficient documentation

## 2012-11-13 DIAGNOSIS — K4020 Bilateral inguinal hernia, without obstruction or gangrene, not specified as recurrent: Secondary | ICD-10-CM | POA: Insufficient documentation

## 2012-11-13 DIAGNOSIS — N281 Cyst of kidney, acquired: Secondary | ICD-10-CM | POA: Insufficient documentation

## 2012-11-13 DIAGNOSIS — C8590 Non-Hodgkin lymphoma, unspecified, unspecified site: Secondary | ICD-10-CM

## 2012-11-13 DIAGNOSIS — N200 Calculus of kidney: Secondary | ICD-10-CM | POA: Insufficient documentation

## 2012-11-13 DIAGNOSIS — E049 Nontoxic goiter, unspecified: Secondary | ICD-10-CM | POA: Insufficient documentation

## 2012-11-13 DIAGNOSIS — K7689 Other specified diseases of liver: Secondary | ICD-10-CM | POA: Insufficient documentation

## 2012-11-13 DIAGNOSIS — C8589 Other specified types of non-Hodgkin lymphoma, extranodal and solid organ sites: Secondary | ICD-10-CM | POA: Insufficient documentation

## 2012-11-13 LAB — GLUCOSE, CAPILLARY: Glucose-Capillary: 147 mg/dL — ABNORMAL HIGH (ref 70–99)

## 2012-11-13 MED ORDER — FLUDEOXYGLUCOSE F - 18 (FDG) INJECTION
18.1000 | Freq: Once | INTRAVENOUS | Status: AC | PRN
  Administered 2012-11-13: 18.1 via INTRAVENOUS

## 2012-11-18 ENCOUNTER — Ambulatory Visit (HOSPITAL_BASED_OUTPATIENT_CLINIC_OR_DEPARTMENT_OTHER): Payer: Medicare Other | Admitting: Hematology & Oncology

## 2012-11-18 ENCOUNTER — Other Ambulatory Visit (HOSPITAL_BASED_OUTPATIENT_CLINIC_OR_DEPARTMENT_OTHER): Payer: Medicare Other | Admitting: Lab

## 2012-11-18 ENCOUNTER — Ambulatory Visit (HOSPITAL_BASED_OUTPATIENT_CLINIC_OR_DEPARTMENT_OTHER): Payer: Medicare Other

## 2012-11-18 VITALS — BP 124/76 | HR 103 | Temp 98.5°F | Resp 18

## 2012-11-18 VITALS — BP 134/76 | HR 96 | Temp 98.6°F | Resp 18 | Ht 69.0 in | Wt 178.0 lb

## 2012-11-18 DIAGNOSIS — C8589 Other specified types of non-Hodgkin lymphoma, extranodal and solid organ sites: Secondary | ICD-10-CM

## 2012-11-18 DIAGNOSIS — C799 Secondary malignant neoplasm of unspecified site: Secondary | ICD-10-CM

## 2012-11-18 DIAGNOSIS — Z5111 Encounter for antineoplastic chemotherapy: Secondary | ICD-10-CM

## 2012-11-18 DIAGNOSIS — C8590 Non-Hodgkin lymphoma, unspecified, unspecified site: Secondary | ICD-10-CM

## 2012-11-18 DIAGNOSIS — D7589 Other specified diseases of blood and blood-forming organs: Secondary | ICD-10-CM

## 2012-11-18 DIAGNOSIS — E119 Type 2 diabetes mellitus without complications: Secondary | ICD-10-CM

## 2012-11-18 DIAGNOSIS — Z5112 Encounter for antineoplastic immunotherapy: Secondary | ICD-10-CM

## 2012-11-18 LAB — CBC WITH DIFFERENTIAL (CANCER CENTER ONLY)
BASO#: 0 10*3/uL (ref 0.0–0.2)
BASO%: 0.5 % (ref 0.0–2.0)
EOS%: 0 % (ref 0.0–7.0)
HGB: 10.7 g/dL — ABNORMAL LOW (ref 13.0–17.1)
LYMPH#: 0.4 10*3/uL — ABNORMAL LOW (ref 0.9–3.3)
MCHC: 31.7 g/dL — ABNORMAL LOW (ref 32.0–35.9)
MONO%: 6.9 % (ref 0.0–13.0)
NEUT#: 3.3 10*3/uL (ref 1.5–6.5)
Platelets: 216 10*3/uL (ref 145–400)

## 2012-11-18 LAB — CMP (CANCER CENTER ONLY)
ALT(SGPT): 17 U/L (ref 10–47)
BUN, Bld: 10 mg/dL (ref 7–22)
CO2: 29 mEq/L (ref 18–33)
Calcium: 9.5 mg/dL (ref 8.0–10.3)
Chloride: 101 mEq/L (ref 98–108)
Creat: 0.7 mg/dl (ref 0.6–1.2)
Glucose, Bld: 169 mg/dL — ABNORMAL HIGH (ref 73–118)
Sodium: 141 mEq/L (ref 128–145)
Total Bilirubin: 0.7 mg/dl (ref 0.20–1.60)
Total Protein: 6.9 g/dL (ref 6.4–8.1)

## 2012-11-18 MED ORDER — DOXORUBICIN HCL CHEMO IV INJECTION 2 MG/ML
50.0000 mg/m2 | Freq: Once | INTRAVENOUS | Status: AC
  Administered 2012-11-18: 96 mg via INTRAVENOUS
  Filled 2012-11-18: qty 48

## 2012-11-18 MED ORDER — SODIUM CHLORIDE 0.9 % IV SOLN
375.0000 mg/m2 | Freq: Once | INTRAVENOUS | Status: AC
  Administered 2012-11-18: 700 mg via INTRAVENOUS
  Filled 2012-11-18: qty 70

## 2012-11-18 MED ORDER — ACETAMINOPHEN 325 MG PO TABS
ORAL_TABLET | ORAL | Status: AC
  Filled 2012-11-18: qty 2

## 2012-11-18 MED ORDER — ONDANSETRON 16 MG/50ML IVPB (CHCC)
INTRAVENOUS | Status: AC
  Filled 2012-11-18: qty 16

## 2012-11-18 MED ORDER — SODIUM CHLORIDE 0.9 % IJ SOLN
3.0000 mL | INTRAMUSCULAR | Status: DC | PRN
  Filled 2012-11-18: qty 10

## 2012-11-18 MED ORDER — SODIUM CHLORIDE 0.9 % IV SOLN
750.0000 mg/m2 | Freq: Once | INTRAVENOUS | Status: AC
  Administered 2012-11-18: 1440 mg via INTRAVENOUS
  Filled 2012-11-18: qty 72

## 2012-11-18 MED ORDER — HEPARIN SOD (PORK) LOCK FLUSH 100 UNIT/ML IV SOLN
250.0000 [IU] | Freq: Once | INTRAVENOUS | Status: DC | PRN
  Filled 2012-11-18: qty 5

## 2012-11-18 MED ORDER — SODIUM CHLORIDE 0.9 % IV SOLN
Freq: Once | INTRAVENOUS | Status: AC
  Administered 2012-11-18: 09:00:00 via INTRAVENOUS

## 2012-11-18 MED ORDER — SODIUM CHLORIDE 0.9 % IJ SOLN
10.0000 mL | INTRAMUSCULAR | Status: DC | PRN
Start: 2012-11-18 — End: 2012-11-18
  Administered 2012-11-18: 10 mL
  Filled 2012-11-18: qty 10

## 2012-11-18 MED ORDER — ONDANSETRON 16 MG/50ML IVPB (CHCC)
16.0000 mg | Freq: Once | INTRAVENOUS | Status: AC
  Administered 2012-11-18: 16 mg via INTRAVENOUS

## 2012-11-18 MED ORDER — HEPARIN SOD (PORK) LOCK FLUSH 100 UNIT/ML IV SOLN
500.0000 [IU] | Freq: Once | INTRAVENOUS | Status: AC | PRN
  Administered 2012-11-18: 500 [IU]
  Filled 2012-11-18: qty 5

## 2012-11-18 MED ORDER — DIPHENHYDRAMINE HCL 25 MG PO CAPS
50.0000 mg | ORAL_CAPSULE | Freq: Once | ORAL | Status: AC
  Administered 2012-11-18: 50 mg via ORAL

## 2012-11-18 MED ORDER — ACETAMINOPHEN 325 MG PO TABS
650.0000 mg | ORAL_TABLET | Freq: Once | ORAL | Status: AC
  Administered 2012-11-18: 650 mg via ORAL

## 2012-11-18 MED ORDER — VINCRISTINE SULFATE CHEMO INJECTION 1 MG/ML
2.0000 mg | Freq: Once | INTRAVENOUS | Status: AC
  Administered 2012-11-18: 2 mg via INTRAVENOUS
  Filled 2012-11-18: qty 2

## 2012-11-18 MED ORDER — DEXAMETHASONE SODIUM PHOSPHATE 20 MG/5ML IJ SOLN
20.0000 mg | Freq: Once | INTRAMUSCULAR | Status: AC
  Administered 2012-11-18: 20 mg via INTRAVENOUS

## 2012-11-18 MED ORDER — DIPHENHYDRAMINE HCL 25 MG PO CAPS
ORAL_CAPSULE | ORAL | Status: AC
  Filled 2012-11-18: qty 2

## 2012-11-18 MED ORDER — DEXAMETHASONE SODIUM PHOSPHATE 20 MG/5ML IJ SOLN
INTRAMUSCULAR | Status: AC
  Filled 2012-11-18: qty 5

## 2012-11-18 MED ORDER — ALTEPLASE 2 MG IJ SOLR
2.0000 mg | Freq: Once | INTRAMUSCULAR | Status: DC | PRN
  Filled 2012-11-18: qty 2

## 2012-11-18 NOTE — Progress Notes (Signed)
This office note has been dictated.

## 2012-11-18 NOTE — Patient Instructions (Signed)
[facility] Discharge Instructions for Patients Receiving Chemotherapy  Today you received the following chemotherapy agents Rituxan, Adriamycin, Vincristine, Cytoxan.   To help prevent nausea and vomiting after your treatment, we encourage you to take your nausea medication as prescribed.    If you develop nausea and vomiting that is not controlled by your nausea medication, call the clinic.   BELOW ARE SYMPTOMS THAT SHOULD BE REPORTED IMMEDIATELY:  *FEVER GREATER THAN 100.5 F  *CHILLS WITH OR WITHOUT FEVER  NAUSEA AND VOMITING THAT IS NOT CONTROLLED WITH YOUR NAUSEA MEDICATION  *UNUSUAL SHORTNESS OF BREATH  *UNUSUAL BRUISING OR BLEEDING  TENDERNESS IN MOUTH AND THROAT WITH OR WITHOUT PRESENCE OF ULCERS  *URINARY PROBLEMS  *BOWEL PROBLEMS  UNUSUAL RASH Items with * indicate a potential emergency and should be followed up as soon as possible.  Feel free to call the clinic you have any questions or concerns. The clinic phone number is [phone].    

## 2012-11-19 ENCOUNTER — Ambulatory Visit (HOSPITAL_BASED_OUTPATIENT_CLINIC_OR_DEPARTMENT_OTHER): Payer: Medicare Other

## 2012-11-19 VITALS — BP 155/72 | HR 81 | Temp 97.0°F | Resp 18

## 2012-11-19 DIAGNOSIS — C8589 Other specified types of non-Hodgkin lymphoma, extranodal and solid organ sites: Secondary | ICD-10-CM

## 2012-11-19 DIAGNOSIS — Z5189 Encounter for other specified aftercare: Secondary | ICD-10-CM

## 2012-11-19 DIAGNOSIS — C799 Secondary malignant neoplasm of unspecified site: Secondary | ICD-10-CM

## 2012-11-19 LAB — LACTATE DEHYDROGENASE: LDH: 469 U/L — ABNORMAL HIGH (ref 94–250)

## 2012-11-19 MED ORDER — PEGFILGRASTIM INJECTION 6 MG/0.6ML
6.0000 mg | Freq: Once | SUBCUTANEOUS | Status: AC
  Administered 2012-11-19: 6 mg via SUBCUTANEOUS

## 2012-11-19 MED ORDER — PEGFILGRASTIM INJECTION 6 MG/0.6ML
SUBCUTANEOUS | Status: AC
  Filled 2012-11-19: qty 0.6

## 2012-11-19 NOTE — Progress Notes (Signed)
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  CURRENT THERAPY:  Status post 4 cycles of R-CHOP.  INTERIM HISTORY:  Keith Duffy comes in for his followup.  He is doing fairly well.  He has had a little bit of pain in the left hip.  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  His blood sugars have been up and down.  His wife is doing a great job keeping these under good control.  His appetite is good.  He has had no problems with nausea or vomiting. He has had no fevers, sweats, or chills.  He has had no change in bowel or bladder habits.  He has not noticed any kind of rashes.  There has been no petechiae.  He has had no leg swelling.  We did go ahead and repeat a PET scan on him.  This was done on 11/13/2012.  PET scan showed no residual left para-aortic lymph node with SUV of 4.3.  There is still some hypermetabolism in the left hip measuring 5.9.  Unfortunately, we really could not get a pre-PET scan on him because he was hospitalized.  However, compared to his first PET scan, he shows some improvement.  PHYSICAL EXAM:  General:  This is a well-developed, well-nourished white gentleman in no obvious distress.  Vital signs:  Temperature of 98.6, pulse 96, respiratory rate 18, blood pressure 134/76.  Weight is 178 pounds.  Head and neck:  Normocephalic, atraumatic skull.  There are no ocular or oral lesions.  There are no palpable cervical or supraclavicular lymph nodes.  Lungs:  Clear bilaterally.  He has no rales, wheezes, or rhonchi.  Cardiac:  Regular rate and rhythm with a normal S1, S2.  There are no murmurs, rubs or bruits.  Abdomen:  Soft. He has good bowel sounds.  There is no fluid wave.  There is no palpable abdominal mass.  There is no palpable hepatosplenomegaly.  Back:  No tenderness over the spine, ribs, or hips.  Extremities:  Show no clubbing, cyanosis or  edema.  Skin:  No rashes, ecchymosis, or petechia. Neurological.  No focal neurological deficits.  LABORATORY STUDIES:  White cell count is 3.9, hemoglobin 10.7, hematocrit 33.8, platelet count 216.  MCV is 101. Neutrophil count is 3.3. BUN is 10, creatinine 0.7.  IMPRESSION:  Keith Duffy is a very nice 67 year old gentleman with diffuse large cell non-Hodgkin's lymphoma.  He does have a high IPI score.  As such, we really need to be careful with respect to his long- term prognosis and also be aggressive with respect to any evidence of progressive disease.  I think this next PET scan is going to be very key for [place].  We will do his next PET scan after his 6th cycle of treatment.  He clearly needs 8 cycles of treatment.  I am glad to see that his hemoglobin is coming up.  I forgot to mention that he is on Aranesp. This is because of excessive bone marrow necrosis when we first saw him.  At some point, we will have to repeat a bone marrow test on him.  We likely will do this after he has his 8 treatments.  I will plan see him back in another 3 weeks.    ______________________________ [name], M.D. PRE/MEDQ  D:  11/18/2012  T:  11/19/2012  Job:  [id number]

## 2012-11-27 ENCOUNTER — Emergency Department (HOSPITAL_COMMUNITY): Payer: Medicare Other

## 2012-11-27 ENCOUNTER — Encounter (HOSPITAL_COMMUNITY): Payer: Self-pay | Admitting: Emergency Medicine

## 2012-11-27 ENCOUNTER — Inpatient Hospital Stay (HOSPITAL_COMMUNITY)
Admission: EM | Admit: 2012-11-27 | Discharge: 2012-11-30 | DRG: 808 | Disposition: A | Discharge status: Home / Self Care | Payer: Medicare Other | Attending: Internal Medicine | Admitting: Internal Medicine

## 2012-11-27 DIAGNOSIS — D61810 Antineoplastic chemotherapy induced pancytopenia: Principal | ICD-10-CM | POA: Diagnosis present

## 2012-11-27 DIAGNOSIS — R000 Tachycardia, unspecified: Secondary | ICD-10-CM | POA: Diagnosis present

## 2012-11-27 DIAGNOSIS — R5081 Fever presenting with conditions classified elsewhere: Secondary | ICD-10-CM | POA: Diagnosis present

## 2012-11-27 DIAGNOSIS — C799 Secondary malignant neoplasm of unspecified site: Secondary | ICD-10-CM | POA: Diagnosis present

## 2012-11-27 DIAGNOSIS — I10 Essential (primary) hypertension: Secondary | ICD-10-CM | POA: Diagnosis present

## 2012-11-27 DIAGNOSIS — D638 Anemia in other chronic diseases classified elsewhere: Secondary | ICD-10-CM | POA: Diagnosis present

## 2012-11-27 DIAGNOSIS — D61818 Other pancytopenia: Secondary | ICD-10-CM

## 2012-11-27 DIAGNOSIS — Z6825 Body mass index (BMI) 25.0-25.9, adult: Secondary | ICD-10-CM

## 2012-11-27 DIAGNOSIS — C8589 Other specified types of non-Hodgkin lymphoma, extranodal and solid organ sites: Secondary | ICD-10-CM | POA: Diagnosis present

## 2012-11-27 DIAGNOSIS — IMO0002 Reserved for concepts with insufficient information to code with codable children: Secondary | ICD-10-CM | POA: Diagnosis present

## 2012-11-27 DIAGNOSIS — E1165 Type 2 diabetes mellitus with hyperglycemia: Secondary | ICD-10-CM | POA: Diagnosis present

## 2012-11-27 DIAGNOSIS — D709 Neutropenia, unspecified: Secondary | ICD-10-CM | POA: Diagnosis present

## 2012-11-27 DIAGNOSIS — C8590 Non-Hodgkin lymphoma, unspecified, unspecified site: Secondary | ICD-10-CM | POA: Diagnosis present

## 2012-11-27 DIAGNOSIS — E119 Type 2 diabetes mellitus without complications: Secondary | ICD-10-CM

## 2012-11-27 DIAGNOSIS — I420 Dilated cardiomyopathy: Secondary | ICD-10-CM | POA: Diagnosis present

## 2012-11-27 DIAGNOSIS — T451X5A Adverse effect of antineoplastic and immunosuppressive drugs, initial encounter: Principal | ICD-10-CM | POA: Diagnosis present

## 2012-11-27 DIAGNOSIS — C801 Malignant (primary) neoplasm, unspecified: Secondary | ICD-10-CM

## 2012-11-27 DIAGNOSIS — R509 Fever, unspecified: Secondary | ICD-10-CM

## 2012-11-27 DIAGNOSIS — Z9221 Personal history of antineoplastic chemotherapy: Secondary | ICD-10-CM

## 2012-11-27 DIAGNOSIS — IMO0001 Reserved for inherently not codable concepts without codable children: Secondary | ICD-10-CM | POA: Diagnosis present

## 2012-11-27 DIAGNOSIS — Z79899 Other long term (current) drug therapy: Secondary | ICD-10-CM

## 2012-11-27 DIAGNOSIS — I428 Other cardiomyopathies: Secondary | ICD-10-CM | POA: Diagnosis present

## 2012-11-27 DIAGNOSIS — I498 Other specified cardiac arrhythmias: Secondary | ICD-10-CM | POA: Diagnosis present

## 2012-11-27 DIAGNOSIS — E43 Unspecified severe protein-calorie malnutrition: Secondary | ICD-10-CM | POA: Diagnosis present

## 2012-11-27 DIAGNOSIS — Z794 Long term (current) use of insulin: Secondary | ICD-10-CM

## 2012-11-27 HISTORY — DX: Malignant (primary) neoplasm, unspecified: C80.1

## 2012-11-27 LAB — CBC WITH DIFFERENTIAL/PLATELET
Basophils Relative: 1 % (ref 0–1)
Eosinophils Relative: 0 % (ref 0–5)
HCT: 27.7 % — ABNORMAL LOW (ref 39.0–52.0)
Hemoglobin: 9.1 g/dL — ABNORMAL LOW (ref 13.0–17.0)
Lymphocytes Relative: 25 % (ref 12–46)
Lymphs Abs: 0.2 10*3/uL — ABNORMAL LOW (ref 0.7–4.0)
MCV: 94.5 fL (ref 78.0–100.0)
Monocytes Absolute: 0.3 10*3/uL (ref 0.1–1.0)
Monocytes Relative: 28 % — ABNORMAL HIGH (ref 3–12)
Neutro Abs: 0.4 10*3/uL — ABNORMAL LOW (ref 1.7–7.7)
Platelets: 117 10*3/uL — ABNORMAL LOW (ref 150–400)
RBC: 2.93 MIL/uL — ABNORMAL LOW (ref 4.22–5.81)
RDW: 14.8 % (ref 11.5–15.5)
WBC: 0.9 10*3/uL — CL (ref 4.0–10.5)

## 2012-11-27 LAB — URINALYSIS, ROUTINE W REFLEX MICROSCOPIC
Bilirubin Urine: NEGATIVE
Glucose, UA: 250 mg/dL — AB
Hgb urine dipstick: NEGATIVE
Nitrite: NEGATIVE
Specific Gravity, Urine: 1.028 (ref 1.005–1.030)
Urobilinogen, UA: 1 mg/dL (ref 0.0–1.0)
pH: 6.5 (ref 5.0–8.0)

## 2012-11-27 LAB — BASIC METABOLIC PANEL
BUN: 11 mg/dL (ref 6–23)
CO2: 23 mEq/L (ref 19–32)
Chloride: 97 mEq/L (ref 96–112)
Creatinine, Ser: 0.89 mg/dL (ref 0.50–1.35)
GFR calc Af Amer: >90 mL/min (ref >90)
GFR calc non Af Amer: 88 mL/min — ABNORMAL LOW (ref >90)
Glucose, Bld: 168 mg/dL — ABNORMAL HIGH (ref 70–99)
Potassium: 3.9 mEq/L (ref 3.5–5.1)
Sodium: 132 mEq/L — ABNORMAL LOW (ref 135–145)

## 2012-11-27 LAB — GLUCOSE, CAPILLARY: Glucose-Capillary: 224 mg/dL — ABNORMAL HIGH (ref 70–99)

## 2012-11-27 MED ORDER — VANCOMYCIN HCL IN DEXTROSE 1-5 GM/200ML-% IV SOLN
1000.0000 mg | Freq: Two times a day (BID) | INTRAVENOUS | Status: DC
  Filled 2012-11-27: qty 200

## 2012-11-27 MED ORDER — ACETAMINOPHEN 650 MG RE SUPP: 650.0000 mg | Freq: Four times a day (QID) | RECTAL | Status: DC | PRN

## 2012-11-27 MED ORDER — INSULIN GLARGINE 100 UNIT/ML ~~LOC~~ SOLN
25.0000 [IU] | Freq: Every day | SUBCUTANEOUS | Status: DC
  Administered 2012-11-27 – 2012-11-29 (×3): 25 [IU] via SUBCUTANEOUS
  Filled 2012-11-27 (×4): qty 0.25

## 2012-11-27 MED ORDER — CYCLOBENZAPRINE HCL 10 MG PO TABS
10.0000 mg | ORAL_TABLET | Freq: Three times a day (TID) | ORAL | Status: DC | PRN
  Filled 2012-11-27 (×2): qty 1

## 2012-11-27 MED ORDER — SODIUM CHLORIDE 0.9 % IV SOLN: INTRAVENOUS | Status: DC

## 2012-11-27 MED ORDER — SODIUM CHLORIDE 0.9 % IV SOLN
INTRAVENOUS | Status: DC
  Administered 2012-11-27: 18:00:00 via INTRAVENOUS

## 2012-11-27 MED ORDER — PIPERACILLIN-TAZOBACTAM 3.375 G IVPB 30 MIN
3.3750 g | INTRAVENOUS | Status: AC
  Administered 2012-11-27: 3.375 g via INTRAVENOUS
  Filled 2012-11-27: qty 50

## 2012-11-27 MED ORDER — PIPERACILLIN-TAZOBACTAM 3.375 G IVPB
3.3750 g | Freq: Three times a day (TID) | INTRAVENOUS | Status: DC
  Administered 2012-11-28 – 2012-11-30 (×7): 3.375 g via INTRAVENOUS
  Filled 2012-11-27 (×9): qty 50

## 2012-11-27 MED ORDER — METOPROLOL TARTRATE 25 MG PO TABS
25.0000 mg | ORAL_TABLET | Freq: Two times a day (BID) | ORAL | Status: DC
  Administered 2012-11-28 – 2012-11-29 (×4): 25 mg via ORAL
  Filled 2012-11-27 (×6): qty 1

## 2012-11-27 MED ORDER — ALLOPURINOL 300 MG PO TABS
300.0000 mg | ORAL_TABLET | Freq: Every day | ORAL | Status: DC
  Filled 2012-11-27: qty 1

## 2012-11-27 MED ORDER — SODIUM CHLORIDE 0.9 % IJ SOLN
3.0000 mL | Freq: Two times a day (BID) | INTRAMUSCULAR | Status: DC
  Administered 2012-11-28: 22:00:00 3 mL via INTRAVENOUS

## 2012-11-27 MED ORDER — ACETAMINOPHEN 325 MG PO TABS
650.0000 mg | ORAL_TABLET | Freq: Once | ORAL | Status: AC
  Administered 2012-11-27: 650 mg via ORAL
  Filled 2012-11-27: qty 2
  Filled 2012-11-27: qty 1

## 2012-11-27 MED ORDER — ACETAMINOPHEN 325 MG PO TABS
650.0000 mg | ORAL_TABLET | Freq: Four times a day (QID) | ORAL | Status: DC | PRN
  Administered 2012-11-27 – 2012-11-28 (×2): 650 mg via ORAL
  Filled 2012-11-27 (×3): qty 2

## 2012-11-27 MED ORDER — ONDANSETRON HCL 8 MG PO TABS: 8.0000 mg | ORAL_TABLET | Freq: Two times a day (BID) | ORAL | Status: DC

## 2012-11-27 MED ORDER — SODIUM CHLORIDE 0.9 % IV BOLUS (SEPSIS)
500.0000 mL | Freq: Once | INTRAVENOUS | Status: AC
  Administered 2012-11-27: 500 mL via INTRAVENOUS

## 2012-11-27 MED ORDER — SODIUM CHLORIDE 0.9 % IV SOLN
INTRAVENOUS | Status: AC
  Administered 2012-11-27 – 2012-11-28 (×2): via INTRAVENOUS

## 2012-11-27 MED ORDER — INSULIN GLARGINE 100 UNIT/ML SOLOSTAR PEN: 25.0000 [IU] | PEN_INJECTOR | Freq: Every evening | SUBCUTANEOUS | Status: DC

## 2012-11-27 MED ORDER — VANCOMYCIN HCL IN DEXTROSE 1-5 GM/200ML-% IV SOLN
1000.0000 mg | INTRAVENOUS | Status: AC
  Administered 2012-11-27: 1000 mg via INTRAVENOUS
  Filled 2012-11-27: qty 200

## 2012-11-27 MED ORDER — PROCHLORPERAZINE MALEATE 10 MG PO TABS
10.0000 mg | ORAL_TABLET | Freq: Four times a day (QID) | ORAL | Status: DC | PRN
  Filled 2012-11-27: qty 1

## 2012-11-27 MED ORDER — INSULIN ASPART 100 UNIT/ML ~~LOC~~ SOLN
0.0000 [IU] | Freq: Three times a day (TID) | SUBCUTANEOUS | Status: DC
  Administered 2012-11-28 – 2012-11-29 (×4): 2 [IU] via SUBCUTANEOUS
  Administered 2012-11-29: 09:00:00 1 [IU] via SUBCUTANEOUS
  Administered 2012-11-29 – 2012-11-30 (×2): 2 [IU] via SUBCUTANEOUS

## 2012-11-27 NOTE — Progress Notes (Signed)
ANTIBIOTIC CONSULT NOTE - INITIAL  Pharmacy Consult for vancomycin/Zosyn Indication: febrile neutropenia  Allergies  Allergen Reactions  . Aspartame And Phenylalanine Diarrhea  . Codeine Other (See Comments)    Unknown     Patient Measurements: Height: 5' 8.9" (175 cm) Weight: 177 lb 14.6 oz (80.7 kg) IBW/kg (Calculated) : 70.47  Vital Signs: Temp: 101.1 F (38.4 C) (10/10 1907) Temp src: Oral (10/10 1907) BP: 120/69 mmHg (10/10 1907) Pulse Rate: 120 (10/10 1907) Intake/Output from previous day:   Intake/Output from this shift:    Labs:  Recent Labs  11/27/12 1740  WBC 0.9*  HGB 9.1*  PLT 117*  CREATININE 0.89   Estimated Creatinine Clearance: 82.5 ml/min (by C-G formula based on Cr of 0.89). No results found for this basename: VANCOTROUGH, VANCOPEAK, VANCORANDOM, GENTTROUGH, GENTPEAK, GENTRANDOM, TOBRATROUGH, TOBRAPEAK, TOBRARND, AMIKACINPEAK, AMIKACINTROU, AMIKACIN,  in the last 72 hours   Microbiology: No results found for this or any previous visit (from the past 720 hour(s)).  Medical History: Past Medical History  Diagnosis Date  . Back pain   . Hypertension   . Diabetes mellitus without complication     Medications:  Scheduled:  . acetaminophen  650 mg Oral Once   Infusions:  . sodium chloride 125 mL/hr at 11/27/12 1823  . sodium chloride     Assessment: 67 yo with large cell NHL s/p 5 cycles of R-CHOP and also a hx DM presented to ER with fever of 101 and neutropenia to be admitted for treatment with broad spectrum abx's.  Goal of Therapy:  Vancomycin trough level 15-20 mcg/ml  Plan:  1) Vancomycin 1g IV q12 2) Zosyn 3.375g IV q8 (extended interval infusion)   Hessie Knows, PharmD, BCPS Pager [phone] 11/27/2012 8:06 PM

## 2012-11-27 NOTE — H&P (Signed)
PCP:   Lise Auer, MD   Chief Complaint:  fever  HPI: 67 yo male nhl received 5th round of chemo last week comes in with about 24 hours of fever.  Some cough dry in nature.  No n/v/d.  No dysuria.  No cp.  No sob.  No rashes.  No abd pain.  No sick contacts.  Unusual for him to run fevers.  anc is around 400.  Review of Systems:  Positive and negative as per HPI otherwise all other systems are negative  Past Medical History: Past Medical History  Diagnosis Date  . Back pain   . Hypertension   . Diabetes mellitus without complication    History reviewed. No pertinent past surgical history.  Medications: Prior to Admission medications   Medication Sig Start Date End Date Taking? Authorizing Provider  allopurinol (ZYLOPRIM) 300 MG tablet Take 300 mg by mouth daily.  10/29/12  Yes Historical Provider, MD  ENSURE PLUS (ENSURE PLUS) LIQD Take 237 mLs by mouth daily.   Yes Historical Provider, MD  famciclovir (FAMVIR) 500 MG tablet Take 0.5 tablets (250 mg total) by mouth daily. 08/28/12  Yes Elease Etienne, MD  insulin aspart (NOVOLOG FLEXPEN) 100 UNIT/ML SOPN FlexPen Inject 0-9 Units into the skin 3 (three) times daily with meals. CBG < 70: eat or drink something sweet & recheck blood sugar. CBG 70 - 120: 0 units  CBG 121 - 150: 1 unit  CBG 151 - 200: 2 units  CBG 201 - 250: 3 units  CBG 251 - 300: 5 units  CBG 301 - 350: 7 units  CBG 351 - 400: 9 units  CBG > 400: call MD 08/28/12  Yes Elease Etienne, MD  Insulin Glargine (LANTUS SOLOSTAR) 100 UNIT/ML SOPN Inject 25 Units into the skin every evening. 09/05/12  Yes Laveda Norman, MD  Insulin Pen Needle 31G X 6 MM MISC Use with insulin Pen 08/28/12  Yes Elease Etienne, MD  lidocaine-prilocaine (EMLA) cream Apply 1 application topically as needed (for port). 09/10/12  Yes [name], MD  metoprolol tartrate (LOPRESSOR) 25 MG tablet Take 1 tablet (25 mg total) by mouth 2 (two) times daily. 08/28/12  Yes Elease Etienne, MD   ondansetron (ZOFRAN) 8 MG tablet Take 1 tablet (8 mg total) by mouth 2 (two) times daily. Take this beginning 1 day after chemotherapy.  Take this for 3 days after each chemotherapy. 09/16/12  Yes [name], MD  oxyCODONE (OXY IR/ROXICODONE) 5 MG immediate release tablet Take 5 mg by mouth every 4 (four) hours as needed for pain.   Yes Historical Provider, MD  prochlorperazine (COMPAZINE) 10 MG tablet Take 10 mg by mouth every 6 (six) hours as needed (nausea). 09/16/12  Yes [name], MD  sulfamethoxazole-trimethoprim (BACTRIM DS,SEPTRA DS) 800-160 MG per tablet Take 1 tablet by mouth every Monday, Wednesday, and Friday.   Yes Historical Provider, MD  cyclobenzaprine (FLEXERIL) 10 MG tablet Take 10 mg by mouth 3 (three) times daily as needed for muscle spasms.    Historical Provider, MD    Allergies:   Allergies  Allergen Reactions  . Aspartame And Phenylalanine Diarrhea  . Codeine Other (See Comments)    Unknown     Social History:  reports that he has never smoked. He does not have any smokeless tobacco history on file. He reports that he does not drink alcohol or use illicit drugs.  Family History: Family History  Problem Relation Age  of Onset  . Heart attack Father   . Heart attack Maternal Grandfather     Physical Exam: Filed Vitals:   11/27/12 1642 11/27/12 1907  BP: 153/77 120/69  Pulse: 121 120  Temp: 98 F (36.7 C) 101.1 F (38.4 C)  TempSrc: Oral Oral  Resp: 17   Height:  5' 8.9" (1.75 m)  Weight:  80.7 kg (177 lb 14.6 oz)  SpO2: 100% 97%   General appearance: alert, cooperative and no distress Head: Normocephalic, without obvious abnormality, atraumatic Eyes: negative Nose: Nares normal. Septum midline. Mucosa normal. No drainage or sinus tenderness. Neck: no JVD and supple, symmetrical, trachea midline Lungs: clear to auscultation bilaterally Heart: regular rate and rhythm, S1, S2 normal, no murmur, click, rub or gallop Abdomen: soft, non-tender;  bowel sounds normal; no masses,  no organomegaly Extremities: extremities normal, atraumatic, no cyanosis or edema Pulses: 2+ and symmetric Skin: Skin color, texture, turgor normal. No rashes or lesions Neurologic: Grossly normal   Labs on Admission:   Recent Labs  11/27/12 1740  NA 132*  K 3.9  CL 97  CO2 23  GLUCOSE 168*  BUN 11  CREATININE 0.89  CALCIUM 9.7    Recent Labs  11/27/12 1740  WBC 0.9*  NEUTROABS 0.4*  HGB 9.1*  HCT 27.7*  MCV 94.5  PLT 117*    Radiological Exams on Admission: Dg Chest 2 View  11/27/2012   CLINICAL DATA:  67 year old male with fever. History of lymphoma and diabetes.  EXAM: CHEST  2 VIEW  COMPARISON:  10/02/2012 and prior chest radiographs dating back to 08/20/2012  FINDINGS: The cardiomediastinal silhouette is unremarkable.  A right Port-A-Cath is again identified with tip overlying the mid SVC.  There is no evidence of focal airspace disease, pulmonary edema, suspicious pulmonary nodule/mass, pleural effusion, or pneumothorax. No acute bony abnormalities are identified.  IMPRESSION: No active cardiopulmonary disease.   Electronically Signed   By: Laveda Abbe M.D.   On: 11/27/2012 18:24    Assessment/Plan  67 yo male with neurtropenic fever Principal Problem:   Neutropenic fever Active Problems:   Metastatic cancer   Dilated cardiomyopathy   Sinus tachycardia   NHL (non-Hodgkin's lymphoma)  Source unclear.  Cover with van/zosyn.  Heme/onc notified and will see in am.  obs on tele.  ivf overnight.  Appears nontoxic.  Cultures pending including from his port.    Madysen Faircloth A 11/27/2012, 8:01 PM

## 2012-11-27 NOTE — ED Notes (Signed)
2nd dose of both Zosyn and Vanc were sent via tube system. Spoke to Amy in pharmacy advised I would return one set as these are refrigerated medications.

## 2012-11-27 NOTE — ED Provider Notes (Signed)
CSN: [id number]     Arrival date & time 11/27/12  1637 History   First MD Initiated Contact with Patient 11/27/12 1640     Chief Complaint  Patient presents with  . Fever  . Cough   (Consider location/radiation/quality/duration/timing/severity/associated sxs/prior Treatment) HPI Comments: Keith Duffy is a 67 y.o. Male who complains of fever, 102 degrees, that started at 5:30 this morning. He took Tylenol at that time. Later, at 1 PM today, his temperature was again elevated at 101.he has been eating normally today. He has not had cough, shortness of breath, nausea, vomiting, change in bowel or urinary habits. He last had chemotherapy 10 days ago. He got Neulasta, at that time. He does not have any known sick contacts. No travel outside of the [place]. He is being treated for diffuse, large cell non-Hodgkin's lymphoma. There are no other known modifying factors.  Patient is a 66 y.o. male presenting with fever and cough. The history is provided by the patient.  Fever Associated symptoms: cough   Cough Associated symptoms: fever     Past Medical History  Diagnosis Date  . Back pain   . Hypertension   . Diabetes mellitus without complication    History reviewed. No pertinent past surgical history. Family History  Problem Relation Age of Onset  . Heart attack Father   . Heart attack Maternal Grandfather    History  Substance Use Topics  . Smoking status: Never Smoker   . Smokeless tobacco: Not on file  . Alcohol Use: No    Review of Systems  Constitutional: Positive for fever.  Respiratory: Positive for cough.   All other systems reviewed and are negative.    Allergies  Aspartame and phenylalanine and Codeine  Home Medications   Current Outpatient Rx  Name  Route  Sig  Dispense  Refill  . allopurinol (ZYLOPRIM) 300 MG tablet   Oral   Take 300 mg by mouth daily.          Marland Kitchen ENSURE PLUS (ENSURE PLUS) LIQD   Oral   Take 237 mLs by mouth daily.          . famciclovir (FAMVIR) 500 MG tablet   Oral   Take 0.5 tablets (250 mg total) by mouth daily.   30 tablet   0   . insulin aspart (NOVOLOG FLEXPEN) 100 UNIT/ML SOPN FlexPen   Subcutaneous   Inject 0-9 Units into the skin 3 (three) times daily with meals. CBG < 70: eat or drink something sweet & recheck blood sugar. CBG 70 - 120: 0 units  CBG 121 - 150: 1 unit  CBG 151 - 200: 2 units  CBG 201 - 250: 3 units  CBG 251 - 300: 5 units  CBG 301 - 350: 7 units  CBG 351 - 400: 9 units  CBG > 400: call MD   5 pen   0   . Insulin Glargine (LANTUS SOLOSTAR) 100 UNIT/ML SOPN   Subcutaneous   Inject 25 Units into the skin every evening.   1 pen   0   . Insulin Pen Needle 31G X 6 MM MISC      Use with insulin Pen   100 each   1   . lidocaine-prilocaine (EMLA) cream   Topical   Apply 1 application topically as needed (for port).         . metoprolol tartrate (LOPRESSOR) 25 MG tablet   Oral   Take 1 tablet (25 mg total) by  mouth 2 (two) times daily.   60 tablet   0   . ondansetron (ZOFRAN) 8 MG tablet   Oral   Take 1 tablet (8 mg total) by mouth 2 (two) times daily. Take this beginning 1 day after chemotherapy.  Take this for 3 days after each chemotherapy.   20 tablet   3   . oxyCODONE (OXY IR/ROXICODONE) 5 MG immediate release tablet   Oral   Take 5 mg by mouth every 4 (four) hours as needed for pain.         Marland Kitchen prochlorperazine (COMPAZINE) 10 MG tablet   Oral   Take 10 mg by mouth every 6 (six) hours as needed (nausea).         . sulfamethoxazole-trimethoprim (BACTRIM DS,SEPTRA DS) 800-160 MG per tablet   Oral   Take 1 tablet by mouth every Monday, Wednesday, and Friday.         . cyclobenzaprine (FLEXERIL) 10 MG tablet   Oral   Take 10 mg by mouth 3 (three) times daily as needed for muscle spasms.          BP 120/69  Pulse 120  Temp(Src) 101.1 F (38.4 C) (Oral)  Resp 17  Ht 5' 8.9" (1.75 m)  Wt 177 lb 14.6 oz (80.7 kg)  BMI 26.35 kg/m2  SpO2  97% Physical Exam  Nursing note and vitals reviewed. Constitutional: He is oriented to person, place, and time. He appears well-developed and well-nourished.  HENT:  Head: Normocephalic and atraumatic.  Right Ear: External ear normal.  Left Ear: External ear normal.  Eyes: Conjunctivae and EOM are normal. Pupils are equal, round, and reactive to light.  Neck: Normal range of motion and phonation normal. Neck supple.  Cardiovascular: Normal rate, regular rhythm, normal heart sounds and intact distal pulses.   Pulmonary/Chest: Effort normal and breath sounds normal. He exhibits no bony tenderness.  Port-A-Cath right upper chest wall, site appears, normal. There is no associated swelling or tenderness in this area.  Abdominal: Soft. Normal appearance. There is no tenderness.  Musculoskeletal: Normal range of motion.  Neurological: He is alert and oriented to person, place, and time. No cranial nerve deficit or sensory deficit. He exhibits normal muscle tone. Coordination normal.  Skin: Skin is warm, dry and intact.  Psychiatric: He has a normal mood and affect. His behavior is normal. Judgment and thought content normal.    ED Course  Procedures (including critical care time) Medications  0.9 %  sodium chloride infusion ( Intravenous New Bag/Given 11/27/12 1823)  acetaminophen (TYLENOL) tablet 650 mg (not administered)  sodium chloride 0.9 % bolus 500 mL (not administered)  vancomycin (VANCOCIN) IVPB 1000 mg/200 mL premix (not administered)  piperacillin-tazobactam (ZOSYN) IVPB 3.375 g (not administered)  vancomycin (VANCOCIN) IVPB 1000 mg/200 mL premix (not administered)  piperacillin-tazobactam (ZOSYN) IVPB 3.375 g (not administered)    Patient Vitals for the past 24 hrs:  BP Temp Temp src Pulse Resp SpO2 Height Weight  11/27/12 1907 120/69 mmHg 101.1 F (38.4 C) Oral 120 - 97 % 5' 8.9" (1.75 m) 177 lb 14.6 oz (80.7 kg)  11/27/12 1642 153/77 mmHg 98 F (36.7 C) Oral 121 17 100 % -  -   7:05 PM-Consult complete with Baltazar Apo. Patient case explained and discussed. He agrees that admitting patient for further evaluation and treatment, is necessary. He would like culture from his Port-A-Cath, and start Zosyn and vancomycin. He would like hospitalist to admit the patient. He will have the patient's  oncologist see him tomorrow. Call ended at 19:30  7:35 PM-Consult complete with Dr. Onalee Hua. Patient case explained and discussed. She agrees to admit patient for further evaluation and treatment. Call ended at 20:02  Labs Review Labs Reviewed  CBC WITH DIFFERENTIAL - Abnormal; Notable for the following:    WBC 0.9 (*)    RBC 2.93 (*)    Hemoglobin 9.1 (*)    HCT 27.7 (*)    Platelets 117 (*)    Monocytes Relative 28 (*)    Neutro Abs 0.4 (*)    Lymphs Abs 0.2 (*)    All other components within normal limits  BASIC METABOLIC PANEL - Abnormal; Notable for the following:    Sodium 132 (*)    Glucose, Bld 168 (*)    GFR calc non Af Amer 88 (*)    All other components within normal limits  URINALYSIS, ROUTINE W REFLEX MICROSCOPIC - Abnormal; Notable for the following:    Glucose, UA 250 (*)    Protein, ur 100 (*)    All other components within normal limits  URINE CULTURE  CULTURE, BLOOD (ROUTINE X 2)  CULTURE, BLOOD (ROUTINE X 2)  CULTURE, BLOOD (SINGLE)  URINE MICROSCOPIC-ADD ON   Imaging Review Dg Chest 2 View  11/27/2012   CLINICAL DATA:  67 year old male with fever. History of lymphoma and diabetes.  EXAM: CHEST  2 VIEW  COMPARISON:  10/02/2012 and prior chest radiographs dating back to 08/20/2012  FINDINGS: The cardiomediastinal silhouette is unremarkable.  A right Port-A-Cath is again identified with tip overlying the mid SVC.  There is no evidence of focal airspace disease, pulmonary edema, suspicious pulmonary nodule/mass, pleural effusion, or pneumothorax. No acute bony abnormalities are identified.  IMPRESSION: No active cardiopulmonary disease.   Electronically  Signed   By: Laveda Abbe M.D.   On: 11/27/2012 18:24    EKG Interpretation   None       MDM   1. Neutropenia   2. Fever    Fever without source, and neutropenia, secondary to chemotherapy. Patient is nontoxic and is not evidencing signs of endorgan damage, or overt signs of sepsis. He'll be admitted for observation and stabilization.   Nursing Notes Reviewed/ Care Coordinated Applicable Imaging Reviewed Interpretation of Laboratory Data incorporated into ED treatment  Plan: Admit  Flint Melter, MD 11/27/12 2009

## 2012-11-27 NOTE — ED Notes (Addendum)
Pt states that he developed a fever of 102'F this morning. Pt took tylenol, and fever was 101'F after taking a nap. Pt has lymphoma, and had his 5th chemotherapy treatment on 11/18/2012. Pt temp upon arrival was 98.0. Pt was instructed to report to ED if he developed fever. Pt reports he developed a dry cough four days ago. Pt is A/O x4. Oxygen saturation is 100% on room air.

## 2012-11-28 DIAGNOSIS — D61818 Other pancytopenia: Secondary | ICD-10-CM

## 2012-11-28 DIAGNOSIS — D649 Anemia, unspecified: Secondary | ICD-10-CM

## 2012-11-28 LAB — BASIC METABOLIC PANEL
BUN: 9 mg/dL (ref 6–23)
CO2: 22 mEq/L (ref 19–32)
Calcium: 8.7 mg/dL (ref 8.4–10.5)
Creatinine, Ser: 0.93 mg/dL (ref 0.50–1.35)
GFR calc Af Amer: >90 mL/min (ref >90)
GFR calc non Af Amer: 86 mL/min — ABNORMAL LOW (ref >90)
Glucose, Bld: 158 mg/dL — ABNORMAL HIGH (ref 70–99)

## 2012-11-28 LAB — CBC
MCH: 31.3 pg (ref 26.0–34.0)
MCHC: 33.3 g/dL (ref 30.0–36.0)
MCV: 93.8 fL (ref 78.0–100.0)
Platelets: 124 10*3/uL — ABNORMAL LOW (ref 150–400)
RBC: 2.4 MIL/uL — ABNORMAL LOW (ref 4.22–5.81)
RDW: 14.9 % (ref 11.5–15.5)

## 2012-11-28 LAB — GLUCOSE, CAPILLARY
Glucose-Capillary: 175 mg/dL — ABNORMAL HIGH (ref 70–99)
Glucose-Capillary: 208 mg/dL — ABNORMAL HIGH (ref 70–99)

## 2012-11-28 MED ORDER — FUROSEMIDE 10 MG/ML IJ SOLN
10.0000 mg | Freq: Once | INTRAMUSCULAR | Status: AC
  Administered 2012-11-28: 19:00:00 10 mg via INTRAVENOUS
  Filled 2012-11-28: qty 1

## 2012-11-28 MED ORDER — SODIUM CHLORIDE 0.9 % IV SOLN: INTRAVENOUS | Status: DC

## 2012-11-28 MED ORDER — SODIUM CHLORIDE 0.9 % IJ SOLN
10.0000 mL | INTRAMUSCULAR | Status: DC | PRN
  Administered 2012-11-29 – 2012-11-30 (×3): 10 mL

## 2012-11-28 MED ORDER — PANTOPRAZOLE SODIUM 40 MG PO TBEC
40.0000 mg | DELAYED_RELEASE_TABLET | Freq: Every day | ORAL | Status: DC
  Administered 2012-11-28 – 2012-11-30 (×3): 40 mg via ORAL
  Filled 2012-11-28 (×5): qty 1

## 2012-11-28 NOTE — Progress Notes (Signed)
[facility] PROGRESS NOTE  Ric Rosenberg NWG:[id number] DOB: 1945/09/11 DOA: 11/27/2012 PCP: Lise Auer, MD  Assessment/Plan: #1 neutropenic fever Questionable etiology. Patient with a temp of 102.3 this morning at 6:14 AM. Urinalysis is negative. Chest x-ray is negative for any acute infiltrate. Blood cultures are pending. Continue empiric IV vancomycin and Zosyn. Oncology is following and appreciate input and recommendations.  #2 pancytopenia Likely chemotherapy induced. Patient's white count is a bed 1.2. Patient's hemoglobin is at 7.5 from 9.1 on admission. Platelet count is at 124. 2 units of packed red blood cells have been ordered for transfusion per hematology oncology. Oncology following and appreciate input and recommendations.  #3 diabetes mellitus Hemoglobin A1c was 8.9 in July of 2014. CBGs have ranged from 155-224. Continue current dose of Lantus. Continue sliding scale insulin.  #4 hypertension Stable. Continue metoprolol.  #5 history of non-Hodgkin's lymphoma/metastatic cancer Per oncology.  #6 sinus tachycardia Likely secondary to problem #1. Patient on metoprolol. Continue empiric IV antibiotics. Follow.  #7 prophylaxis PPI for GI prophylaxis. SCDs for DVT prophylaxis. Code Status: Full Family Communication: Updated patient and wife at bedside Disposition Plan: Home when medically stable.   Consultants:  Oncology: Dr. [name] 11/28/2012  Procedures:  Chest x-ray 11/27/2012  Antibiotics:  IV vancomycin 11/27/2012  IV Zosyn 11/27/2012  HPI/Subjective: Patient states he's feeling better than when he did on admission. Patient denies any diarrhea. Patient denies any GI bleed.  Objective: Filed Vitals:   11/28/12 0730  BP:   Pulse:   Temp: 98.7 F (37.1 C)  Resp:     Intake/Output Summary (Last 24 hours) at 11/28/12 1153 Last data filed at 11/28/12 0833  Gross per 24 hour  Intake 1633.34 ml  Output   1175 ml  Net 458.34 ml   Filed  Weights   11/27/12 1907 11/27/12 2134  Weight: 80.7 kg (177 lb 14.6 oz) 78.9 kg (173 lb 15.1 oz)    Exam:   General:  NAD  Cardiovascular: RRR  Respiratory: CTAB  Abdomen: Soft/NT/ND/+BS  Musculoskeletal: No c/c/e  Data Reviewed: Basic Metabolic Panel:  Recent Labs Lab 11/27/12 1740 11/28/12 0425  NA 132* 132*  K 3.9 3.6  CL 97 101  CO2 23 22  GLUCOSE 168* 158*  BUN 11 9  CREATININE 0.89 0.93  CALCIUM 9.7 8.7   Liver Function Tests: No results found for this basename: AST, ALT, ALKPHOS, BILITOT, PROT, ALBUMIN,  in the last 168 hours No results found for this basename: LIPASE, AMYLASE,  in the last 168 hours No results found for this basename: AMMONIA,  in the last 168 hours CBC:  Recent Labs Lab 11/27/12 1740 11/28/12 0425  WBC 0.9* 1.2*  NEUTROABS 0.4*  --   HGB 9.1* 7.5*  HCT 27.7* 22.5*  MCV 94.5 93.8  PLT 117* 124*   Cardiac Enzymes: No results found for this basename: CKTOTAL, CKMB, CKMBINDEX, TROPONINI,  in the last 168 hours BNP (last 3 results) No results found for this basename: PROBNP,  in the last 8760 hours CBG:  Recent Labs Lab 11/27/12 2131 11/28/12 0732 11/28/12 1137  GLUCAP 224* 155* 175*    No results found for this or any previous visit (from the past 240 hour(s)).   Studies: Dg Chest 2 View  11/27/2012   CLINICAL DATA:  67 year old male with fever. History of lymphoma and diabetes.  EXAM: CHEST  2 VIEW  COMPARISON:  10/02/2012 and prior chest radiographs dating back to 08/20/2012  FINDINGS: The cardiomediastinal silhouette is unremarkable.  A right Port-A-Cath is again identified with tip overlying the mid SVC.  There is no evidence of focal airspace disease, pulmonary edema, suspicious pulmonary nodule/mass, pleural effusion, or pneumothorax. No acute bony abnormalities are identified.  IMPRESSION: No active cardiopulmonary disease.   Electronically Signed   By: Laveda Abbe M.D.   On: 11/27/2012 18:24    Scheduled Meds: .  furosemide  10 mg Intravenous Once  . insulin aspart  0-9 Units Subcutaneous TID WC  . insulin glargine  25 Units Subcutaneous QHS  . metoprolol tartrate  25 mg Oral BID  . piperacillin-tazobactam (ZOSYN)  IV  3.375 g Intravenous Q8H  . sodium chloride  3 mL Intravenous Q12H   Continuous Infusions:   Principal Problem:   Neutropenic fever Active Problems:   Anemia of chronic disease   Metastatic cancer   Protein-calorie malnutrition, severe   DM (diabetes mellitus), type 2, uncontrolled   Dilated cardiomyopathy   Sinus tachycardia   NHL (non-Hodgkin's lymphoma)   Pancytopenia    Time spent: > 35 mins    [facility]  [facility] Pager [phone]. If 7PM-7AM, please contact night-coverage at [email or web address], password [facility] 11/28/2012, 11:53 AM  LOS: 1 day

## 2012-11-28 NOTE — Progress Notes (Signed)
This morning, patient's temp was back up to 102.3. Doctor made aware. Blood cultures were taken down in the ED. Will continue to monitor patient and will recheck temp in an hour.

## 2012-11-28 NOTE — Consult Note (Signed)
#   [id number] is consult note.  Hewitt Shorts  Psalm 55:22

## 2012-11-29 LAB — GLUCOSE, CAPILLARY
Glucose-Capillary: 124 mg/dL — ABNORMAL HIGH (ref 70–99)
Glucose-Capillary: 155 mg/dL — ABNORMAL HIGH (ref 70–99)
Glucose-Capillary: 214 mg/dL — ABNORMAL HIGH (ref 70–99)

## 2012-11-29 LAB — TYPE AND SCREEN
ABO/RH(D): O POS
Antibody Screen: NEGATIVE
Unit division: 0
Unit division: 0

## 2012-11-29 LAB — CBC WITH DIFFERENTIAL/PLATELET
Basophils Absolute: 0 10*3/uL (ref 0.0–0.1)
Basophils Relative: 1 % (ref 0–1)
Eosinophils Relative: 1 % (ref 0–5)
HCT: 29.2 % — ABNORMAL LOW (ref 39.0–52.0)
Hemoglobin: 9.7 g/dL — ABNORMAL LOW (ref 13.0–17.0)
Lymphocytes Relative: 18 % (ref 12–46)
MCHC: 33.2 g/dL (ref 30.0–36.0)
MCV: 91.5 fL (ref 78.0–100.0)
Monocytes Absolute: 0.3 10*3/uL (ref 0.1–1.0)
Monocytes Relative: 21 % — ABNORMAL HIGH (ref 3–12)
Neutro Abs: 1 10*3/uL — ABNORMAL LOW (ref 1.7–7.7)
RDW: 15.8 % — ABNORMAL HIGH (ref 11.5–15.5)
WBC: 1.6 10*3/uL — ABNORMAL LOW (ref 4.0–10.5)

## 2012-11-29 LAB — MAGNESIUM: Magnesium: 2.1 mg/dL (ref 1.5–2.5)

## 2012-11-29 LAB — BASIC METABOLIC PANEL
BUN: 10 mg/dL (ref 6–23)
Chloride: 104 mEq/L (ref 96–112)
GFR calc Af Amer: >90 mL/min (ref >90)
Potassium: 3.3 mEq/L — ABNORMAL LOW (ref 3.5–5.1)
Sodium: 138 mEq/L (ref 135–145)

## 2012-11-29 MED ORDER — POTASSIUM CHLORIDE CRYS ER 20 MEQ PO TBCR
40.0000 meq | EXTENDED_RELEASE_TABLET | Freq: Once | ORAL | Status: AC
  Administered 2012-11-29: 40 meq via ORAL
  Filled 2012-11-29: qty 2

## 2012-11-29 NOTE — Consult Note (Cosign Needed)
NAME:  BARI, HANDSHOE NO.:  [id number]  MEDICAL RECORD NO.:  [id number]  LOCATION:  1422                         FACILITY:  [facility]  PHYSICIAN:  [name], M.D.  DATE OF BIRTH:  1946/02/17  DATE OF CONSULTATION:  11/28/2012 DATE OF DISCHARGE:                                CONSULTATION   REASON FOR CONSULTATION: 1. Febrile neutropenia. 2. 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  HISTORY OF PRESENT ILLNESS:  Mr. Ose is a very nice 67 year old, white gentleman.  I know him well.  He has diffuse large cell lymphoma. He has had 5 cycles of chemotherapy.  He is getting R-CHOP.  He had chemotherapy back on November 18, 2012.  He has tolerated chemotherapy fairly well.  He has had a response to treatment.  He has been having some left hip pain.  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  He does get Neulasta after a chemotherapy.  He subsequently developed a temperature of 102 on the 10th.  He felt okay according to his wife.  He had no mouth sores.  He had no nausea or vomiting.  He had no diarrhea.  There was no rashes.  He had little bit of dry cough.  When he came to the emergency room, his white cell count was 0.9.  His monocytes were 28%.  His hemoglobin was 9.1.  Platelet count 217.  His BUN was 11, creatinine 0.89.  He does have diabetes.  He is on insulin.  On admission, he only had chest x-ray.  Chest x-ray was negative for any acute disease.  Cultures so far are pending.  He was started on Zosyn and vancomycin.  We are asked to see him to try to help with the management.  PAST MEDICAL HISTORY:  Remarkable for: 1. Diffuse large cell non-Hodgkin's lymphoma. 2. Hypertension. 3. Remote history of follicular small-cell lymphoma. 4. Diabetes-insulin dependent.  ALLERGIES: 1. CODEINE. 2. ASPARTAME.  MEDICATIONS AT HOME: 1. Allopurinol 300 mg p.o. daily. 2. Flexeril  10 mg p.o. q.8 hours p.r.n. 3. Famvir 250 mg p.o. daily. 4. Lantus insulin 25 units subcu daily. 5. Metoprolol 25 mg p.o. b.i.d. 6. Zofran 8 mg q.2 hours p.r.n. 7. Oxy IR 5 mg p.o. q.4 hours p.r.n. 8. Compazine 10 mg p.o. q.6 hours p.r.n. 9. Bactrim DS 1 p.o. b.i.d. Monday, Wednesday, and Friday.  SOCIAL HISTORY:  Negative for tobacco use.  There is no alcohol use.  PHYSICAL EXAMINATION:  GENERAL:  This is a fairly well-developed, well- nourished white gentleman, in no obvious distress.  She is alert and oriented x3. VITAL SIGNS:  Temperature of 102.3, pulse 124, respiratory rate 16, blood pressure 115/55, oxygen saturation 97%. HEAD AND NECK:  Normocephalic, atraumatic skull.  There are no ocular or oral lesions.  He has no mucositis.  There is no scleral icterus.  There is no adenopathy in his neck. LUNGS:  Clear to percussion and auscultation bilaterally. CARDIAC:  Regular rate and rhythm with normal S1 and S2.  There are no murmurs, rubs, or bruits.  ABDOMEN:  Soft.  He has good bowel sounds. There  is no fluid wave.  There is no palpable abdominal mass.  There is no palpable hepatosplenomegaly.  Port-A-Cath site is intact.  No erythema or warmth was noted with the Port-A-Cath site. EXTREMITIES:  No clubbing, cyanosis, or edema. SKIN:  No rashes, ecchymosis, or petechia. NEUROLOGICAL:  No focal neurological deficits.  LABORATORY STUDIES:  White cell count is 1.2, hemoglobin is 7.5, hematocrit 22.5, platelet count 124.  BUN 29, creatinine 0.93. Potassium 3.6.  IMPRESSION:  Mr. Scholle is a 67 year old gentleman.  He has diffuse large cell lymphoma.  He has fairly aggressive disease.  He has responded to date.  It is not surprising, I guess, that he is neutropenic.  Again, he is on Neulasta.  I do not see a role for Neupogen.  His monocytes are high, so one would think that his bone marrow is starting to recover now.  His white cell count is starting to go back up.  He is  anemic.  I think that we probably should transfuse him.  This will help with respect to cardiovascular stability.  We will continue him on the IV antibiotics.  I will see what his cultures show.  We will follow him along.  I very much appreciate the great care that he is getting from the hospitalist.  I also appreciate the care that he is getting on [address].     [name], M.D.     PRE/MEDQ  D:  11/28/2012  T:  11/28/2012  Job:  [id number]

## 2012-11-29 NOTE — Progress Notes (Signed)
Pt has had uneventful day.Cont with plan of care. Wife at the bedside

## 2012-11-29 NOTE — Progress Notes (Signed)
[facility] PROGRESS NOTE  Keith Duffy NUU:[id number] DOB: [birth year] DOA: 11/27/2012 PCP: Lise Auer, MD  Assessment/Plan: #1 neutropenic fever Questionable etiology. Patient afebrile. Clinical improvement. Urinalysis is negative. Chest x-ray is negative for any acute infiltrate. Blood cultures are pending. Continue empiric IV  Zosyn. Oncology is following and appreciate input and recommendations.  #2 pancytopenia Likely chemotherapy induced. Patient's white count is improving and at 1.6. Patient's hemoglobin is at 9.7 from  7.5 from 9.1 on admission. Pt s/p 2 units PRBC yesterday. Platelet count is at 156. 2 units of packed red blood cells have been ordered for transfusion per hematology oncology. Oncology following and appreciate input and recommendations.  #3 diabetes mellitus Hemoglobin A1c was 8.9 in July of 2014. CBGs have ranged from 124-208. Continue current dose of Lantus. Continue sliding scale insulin.  #4 hypertension Stable. Continue metoprolol.  #5 history of non-Hodgkin's lymphoma/metastatic cancer Per oncology.  #6 sinus tachycardia Likely secondary to problem #1. Patient on metoprolol. Continue empiric IV antibiotics. Follow.  #7 prophylaxis PPI for GI prophylaxis. SCDs for DVT prophylaxis. Code Status: Full Family Communication: Updated patient and wife at bedside Disposition Plan: Home when medically stable.   Consultants:  Oncology: Dr. [name] 11/28/2012  Procedures:  Chest x-ray 11/27/2012  2 units PRBCs 11/28/12  Antibiotics:  IV vancomycin 11/27/2012--->11/28/12  IV Zosyn 11/27/2012  HPI/Subjective: Patient states he's feeling better than when he did on admission. Patient denies any diarrhea. Patient denies any GI bleed.  Objective: Filed Vitals:   11/29/12 0909  BP: 120/74  Pulse:   Temp:   Resp:     Intake/Output Summary (Last 24 hours) at 11/29/12 0946 Last data filed at 11/28/12 2323  Gross per 24 hour  Intake  1863.17 ml  Output      0 ml  Net 1863.17 ml   Filed Weights   11/27/12 1907 11/27/12 2134  Weight: 80.7 kg (177 lb 14.6 oz) 78.9 kg (173 lb 15.1 oz)    Exam:   General:  NAD  Cardiovascular: RRR  Respiratory: CTAB  Abdomen: Soft/NT/ND/+BS  Musculoskeletal: No c/c/e  Data Reviewed: Basic Metabolic Panel:  Recent Labs Lab 11/27/12 1740 11/28/12 0425 11/29/12 0420  NA 132* 132* 138  K 3.9 3.6 3.3*  CL 97 101 104  CO2 23 22 24   GLUCOSE 168* 158* 117*  BUN 11 9 10   CREATININE 0.89 0.93 0.85  CALCIUM 9.7 8.7 8.7  MG  --   --  2.1   Liver Function Tests: No results found for this basename: AST, ALT, ALKPHOS, BILITOT, PROT, ALBUMIN,  in the last 168 hours No results found for this basename: LIPASE, AMYLASE,  in the last 168 hours No results found for this basename: AMMONIA,  in the last 168 hours CBC:  Recent Labs Lab 11/27/12 1740 11/28/12 0425 11/29/12 0420  WBC 0.9* 1.2* 1.6*  NEUTROABS 0.4*  --  1.0*  HGB 9.1* 7.5* 9.7*  HCT 27.7* 22.5* 29.2*  MCV 94.5 93.8 91.5  PLT 117* 124* 156   Cardiac Enzymes: No results found for this basename: CKTOTAL, CKMB, CKMBINDEX, TROPONINI,  in the last 168 hours BNP (last 3 results) No results found for this basename: PROBNP,  in the last 8760 hours CBG:  Recent Labs Lab 11/28/12 0732 11/28/12 1137 11/28/12 1618 11/28/12 2009 11/29/12 0706  GLUCAP 155* 175* 157* 208* 124*    Recent Results (from the past 240 hour(s))  CULTURE, BLOOD (ROUTINE X 2)     Status: None   Collection Time  11/27/12  5:40 PM      Result Value Range Status   Specimen Description BLOOD LEFT ANTECUBITAL   Final   Special Requests NONE BOTTLES DRAWN AEROBIC AND ANAEROBIC 3CC   Final   Culture  Setup Time     Final   Value: 11/27/2012 21:03     Performed at [facility]   Culture     Final   Value:        BLOOD CULTURE RECEIVED NO GROWTH TO DATE CULTURE WILL BE HELD FOR 5 DAYS BEFORE ISSUING A FINAL NEGATIVE REPORT      Performed at [facility]   Report Status PENDING   Incomplete  URINE CULTURE     Status: None   Collection Time    11/27/12  5:50 PM      Result Value Range Status   Specimen Description URINE, CLEAN CATCH   Final   Special Requests NONE   Final   Culture  Setup Time     Final   Value: 11/27/2012 21:15     Performed at [facility] Count PENDING   Incomplete   Culture     Final   Value: Culture reincubated for better growth     Performed at [facility]   Report Status PENDING   Incomplete     Studies: Dg Chest 2 View  11/27/2012   CLINICAL DATA:  67 year old male with fever. History of lymphoma and diabetes.  EXAM: CHEST  2 VIEW  COMPARISON:  10/02/2012 and prior chest radiographs dating back to 08/20/2012  FINDINGS: The cardiomediastinal silhouette is unremarkable.  A right Port-A-Cath is again identified with tip overlying the mid SVC.  There is no evidence of focal airspace disease, pulmonary edema, suspicious pulmonary nodule/mass, pleural effusion, or pneumothorax. No acute bony abnormalities are identified.  IMPRESSION: No active cardiopulmonary disease.   Electronically Signed   By: Laveda Abbe M.D.   On: 11/27/2012 18:24    Scheduled Meds: . insulin aspart  0-9 Units Subcutaneous TID WC  . insulin glargine  25 Units Subcutaneous QHS  . metoprolol tartrate  25 mg Oral BID  . pantoprazole  40 mg Oral Q0600  . piperacillin-tazobactam (ZOSYN)  IV  3.375 g Intravenous Q8H  . sodium chloride  3 mL Intravenous Q12H   Continuous Infusions: . sodium chloride Stopped (11/28/12 2030)    Principal Problem:   Neutropenic fever Active Problems:   Anemia of chronic disease   Metastatic cancer   Protein-calorie malnutrition, severe   DM (diabetes mellitus), type 2, uncontrolled   Dilated cardiomyopathy   Sinus tachycardia   NHL (non-Hodgkin's lymphoma)   Pancytopenia    Time spent: > 35 mins    [facility]  [facility] Pager  [phone]. If 7PM-7AM, please contact night-coverage at [email or web address], password [email or web address] 11/29/2012, 9:46 AM  LOS: 2 days

## 2012-11-29 NOTE — Progress Notes (Signed)
Keith Duffy is doing okay. He feels better. He got 2 units of blood. This helped him out.  His cultures so far are negative. He currently is on Zosyn.  He's been afebrile for 24 hours.  I think we can DC'd neutropenic precautions. His white cell count is 1.6. His absolute neutrophil count is above 500. His hemoglobin is 9.7. His monocytes are still elevated so this indicates his marrow is still recovering.  His potassium is 3.3. We can replace this.  The blood sugars are doing fairly well.  Is no mouth sores. He's had no diarrhea.  On his physical exam, temperature 98, pulse 95 and blood pressure 114/61.  Lungs are clear. Cardiac exam regular rate and rhythm with no murmurs rubs or bruits. Oral exam shows no mucositis. Skin exam shows no rashes. Abdomen is soft. There is no palpable hepato-splenomegaly. Neurological exam no focal neurological deficits.  I think that we can probably watch him and today. If he is afebrile today, and his white cell count continues to increase, then we can probably let him go home tomorrow.  We will DC the neutropenic precautions. We will also DC cardiac monitor. We will liberalize his diet. He is hungry and wants to eat more.   I appreciate the great care that he is getting on [address] by all the staff and the hospitalist!!! [name]

## 2012-11-30 LAB — CBC WITH DIFFERENTIAL/PLATELET
Basophils Absolute: 0 10*3/uL (ref 0.0–0.1)
Basophils Relative: 1 % (ref 0–1)
Eosinophils Absolute: 0 10*3/uL (ref 0.0–0.7)
Eosinophils Relative: 1 % (ref 0–5)
HCT: 30.5 % — ABNORMAL LOW (ref 39.0–52.0)
Hemoglobin: 10.1 g/dL — ABNORMAL LOW (ref 13.0–17.0)
Lymphocytes Relative: 18 % (ref 12–46)
Lymphs Abs: 0.4 10*3/uL — ABNORMAL LOW (ref 0.7–4.0)
MCH: 30.2 pg (ref 26.0–34.0)
MCHC: 33.1 g/dL (ref 30.0–36.0)
MCV: 91.3 fL (ref 78.0–100.0)
Monocytes Absolute: 0.4 10*3/uL (ref 0.1–1.0)
Monocytes Relative: 19 % — ABNORMAL HIGH (ref 3–12)
Neutro Abs: 1.5 10*3/uL — ABNORMAL LOW (ref 1.7–7.7)
Neutrophils Relative %: 61 % (ref 43–77)
Platelets: 199 10*3/uL (ref 150–400)
RBC: 3.34 MIL/uL — ABNORMAL LOW (ref 4.22–5.81)
RDW: 15.7 % — ABNORMAL HIGH (ref 11.5–15.5)
WBC: 2.3 10*3/uL — ABNORMAL LOW (ref 4.0–10.5)

## 2012-11-30 LAB — URINE CULTURE: Colony Count: 55000

## 2012-11-30 LAB — BASIC METABOLIC PANEL
BUN: 10 mg/dL (ref 6–23)
Creatinine, Ser: 0.84 mg/dL (ref 0.50–1.35)
GFR calc Af Amer: >90 mL/min (ref >90)
GFR calc non Af Amer: 90 mL/min — ABNORMAL LOW (ref >90)

## 2012-11-30 LAB — GLUCOSE, CAPILLARY: Glucose-Capillary: 164 mg/dL — ABNORMAL HIGH (ref 70–99)

## 2012-11-30 MED ORDER — SULFAMETHOXAZOLE-TMP DS 800-160 MG PO TABS: 1.0000 | ORAL_TABLET | Freq: Two times a day (BID) | ORAL | Status: DC

## 2012-11-30 MED ORDER — POTASSIUM CHLORIDE CRYS ER 20 MEQ PO TBCR
40.0000 meq | EXTENDED_RELEASE_TABLET | Freq: Once | ORAL | Status: AC
  Administered 2012-11-30: 40 meq via ORAL
  Filled 2012-11-30: qty 2

## 2012-11-30 MED ORDER — SULFAMETHOXAZOLE-TRIMETHOPRIM 800-160 MG PO TABS: 1.0000 | ORAL_TABLET | ORAL | Status: DC

## 2012-11-30 MED ORDER — CEPHALEXIN 500 MG PO CAPS: 500.0000 mg | ORAL_CAPSULE | Freq: Four times a day (QID) | ORAL | Status: AC

## 2012-11-30 MED ORDER — HEPARIN SOD (PORK) LOCK FLUSH 100 UNIT/ML IV SOLN
500.0000 [IU] | INTRAVENOUS | Status: AC | PRN
  Administered 2012-11-30: 11:00:00 500 [IU]

## 2012-11-30 NOTE — Discharge Summary (Signed)
Physician Discharge Summary  Keith Duffy BJY:[id number] DOB: 03-22-1945 DOA: 11/27/2012  PCP: Keith Auer, MD  Admit date: 11/27/2012 Discharge date: 11/30/2012  Time spent: 65 minutes  Recommendations for Outpatient Follow-up:  1. Patient is to followup with Dr. Myna Duffy of oncology as previously scheduled.  Discharge Diagnoses:  Principal Problem:   Neutropenic fever Active Problems:   Anemia of chronic disease   Metastatic cancer   Protein-calorie malnutrition, severe   DM (diabetes mellitus), type 2, uncontrolled   Dilated cardiomyopathy   Sinus tachycardia   NHL (non-Hodgkin's lymphoma)   Pancytopenia   Discharge Condition: Stable and improved  Diet recommendation: Carb modified  Filed Weights   11/27/12 1907 11/27/12 2134  Weight: 80.7 kg (177 lb 14.6 oz) 78.9 kg (173 lb 15.1 oz)    History of present illness:  67 yo male nhl received 5th round of chemo last week comes in with about 24 hours of fever. Some cough dry in nature. No n/v/d. No dysuria. No cp. No sob. No rashes. No abd pain. No sick contacts. Unusual for him to run fevers. anc is around 400.   Hospital Course:  #1 neutropenic fever  Patient  was admitted with neutropenic fevers with a MAXIMUM TEMPERATURE of 102.3. Patient was pan cultured. Blood cultures had no growth to date. Chest x-ray was negative for any acute infiltrate. Patient was initially started apparently on IV vancomycin and Zosyn and followed. Patient was also seen in consultation by oncology and followed throughout the hospitalization. IV vancomycin was discontinued and patient maintained on IV Zosyn throughout the hospitalization. Patient improved clinically. Patient's white count also improved such that neutropenic precautions were discontinued. Patient was maintained on Zosyn with clinical improvement. Urine cultures came back with 55,000 colonies of coagulase negative staphylococcus. Due to patient's immunocompromised state, currently on  chemotherapy, patient will be discharged home on 4 more days of oral Keflex to complete a one-week course of antibiotic therapy. Patient will followup with oncology as outpatient. #2 pancytopenia  Likely chemotherapy induced. Patient was followed by oncology during the hospitalization. Patient was placed empirically on IV antibiotics secondary to his neutropenic fever. Patient was also transfused 2 units of packed red blood cells as his hemoglobin dropped down to 7.5. Patient had appropriate response. On day of discharge patient's counts have improved his white count was up to 2.3 hemoglobin was 10.1 and his platelet count was 199. Patient will be discharged in stable and improved condition. Patient will followup with oncology as outpatient. #3 diabetes mellitus  Hemoglobin A1c was 8.9 in July of 2014. CBGs have ranged from 155-224. Maintained on home dose of Lantus. Continue sliding scale insulin.  #4 hypertension  Stable. Continued on metoprolol.  #5 history of non-Hodgkin's lymphoma/metastatic cancer  Patient was followed by oncology and will followup as outpatient.  #6 sinus tachycardia  Likely secondary to problem #1. Patient on metoprolol. Patient was maintained apparently on IV antibiotics. Patient sinus tachycardia improved and had resolved by day of discharge.       Procedures: Chest x-ray 11/27/2012  2 units PRBCs 11/28/12   Consultations: Oncology: Dr. Myna Duffy 11/28/2012   Discharge Exam: Filed Vitals:   11/30/12 0537  BP: 151/88  Pulse: 80  Temp: 98.1 F (36.7 C)  Resp: 20    General: NAD Cardiovascular: RRR Respiratory: CTAB  Discharge Instructions      Discharge Orders   Future Appointments Provider Department Dept Phone   12/16/2012 8:15 AM Elinor Dodge A [facility]  [phone]   12/16/2012 8:30 AM [name], MD [facility] [phone]   12/16/2012 9:00 AM [facility] Chair 8 [place]  [facility] [phone]   12/17/2012 2:30 PM [facility] Inj Nurse [place] [facility] [phone]   01/06/2013 8:00 AM [name] [facility] [phone]   01/06/2013 8:15 AM [name], MD [place] [facility] [phone]   01/06/2013 8:30 AM [facility] Bed 1 [place] [facility] [phone]   01/07/2013 2:00 PM [facility] Inj Nurse [facility] [phone]   Future Orders Complete By Expires   Diet Carb Modified  As directed    Discharge instructions  As directed    Comments:     Follow up with Dr Keith Duffy as scheduled.   Increase activity slowly  As directed        Medication List         allopurinol 300 MG tablet  Commonly known as:  ZYLOPRIM  Take 300 mg by mouth daily.     cephALEXin 500 MG capsule  Commonly known as:  KEFLEX  Take 1 capsule (500 mg total) by mouth 4 (four) times daily. Take for 4 days then stop.     cyclobenzaprine 10 MG tablet  Commonly known as:  FLEXERIL  Take 10 mg by mouth 3 (three) times daily as needed for muscle spasms.     ENSURE PLUS Liqd  Take 237 mLs by mouth daily.     famciclovir 500 MG tablet  Commonly known as:  FAMVIR  Take 0.5 tablets (250 mg total) by mouth daily.     insulin aspart 100 UNIT/ML Sopn FlexPen  Commonly known as:  NOVOLOG FLEXPEN  - Inject 0-9 Units into the skin 3 (three) times daily with meals. CBG < 70: eat or drink something sweet & recheck blood sugar.  - CBG 70 - 120: 0 units   - CBG 121 - 150: 1 unit   - CBG 151 - 200: 2 units   - CBG 201 - 250: 3 units   - CBG 251 - 300: 5 units   - CBG 301 - 350: 7 units   - CBG 351 - 400: 9 units   - CBG > 400: call MD     Insulin Glargine 100 UNIT/ML Sopn  Commonly known as:  LANTUS SOLOSTAR  Inject 25 Units into the skin every evening.     Insulin Pen Needle 31G X 6 MM Misc  Use with insulin Pen      lidocaine-prilocaine cream  Commonly known as:  EMLA  Apply 1 application topically as needed (for port).     metoprolol tartrate 25 MG tablet  Commonly known as:  LOPRESSOR  Take 1 tablet (25 mg total) by mouth 2 (two) times daily.     ondansetron 8 MG tablet  Commonly known as:  ZOFRAN  Take 1 tablet (8 mg total) by mouth 2 (two) times daily. Take this beginning 1 day after chemotherapy.  Take this for 3 days after each chemotherapy.     oxyCODONE 5 MG immediate release tablet  Commonly known as:  Oxy IR/ROXICODONE  Take 5 mg by mouth every 4 (four) hours as needed for pain.     prochlorperazine 10 MG tablet  Commonly known as:  COMPAZINE  Take 10 mg by mouth every 6 (six) hours as  needed (nausea).     sulfamethoxazole-trimethoprim 800-160 MG per tablet  Commonly known as:  BACTRIM DS,SEPTRA DS  Take 1 tablet by mouth every Monday, Wednesday, and Friday. Resume in 4 days.  Start taking on:  12/04/2012       Allergies  Allergen Reactions  . Aspartame And Phenylalanine Diarrhea  . Codeine Other (See Comments)    Unknown    Follow-up Information   Follow up with [name], MD. (f/u as scheduled)    Specialty:  Oncology   Contact information:   [address] [place] [address] [phone]        The results of significant diagnostics from this hospitalization (including imaging, microbiology, ancillary and laboratory) are listed below for reference.    Significant Diagnostic Studies: Dg Chest 2 View  11/27/2012   CLINICAL DATA:  67 year old male with fever. History of lymphoma and diabetes.  EXAM: CHEST  2 VIEW  COMPARISON:  10/02/2012 and prior chest radiographs dating back to 08/20/2012  FINDINGS: The cardiomediastinal silhouette is unremarkable.  A right Port-A-Cath is again identified with tip overlying the mid SVC.  There is no evidence of focal airspace disease, pulmonary edema, suspicious pulmonary nodule/mass, pleural effusion, or  pneumothorax. No acute bony abnormalities are identified.  IMPRESSION: No active cardiopulmonary disease.   Electronically Signed   By: Laveda Abbe M.D.   On: 11/27/2012 18:24   Nm Pet Image Restag (ps) Skull Base To Thigh  11/13/2012   CLINICAL DATA:  Subsequent treatment strategy for non-Hodgkin's lymphoma.  EXAM: NUCLEAR MEDICINE PET SKULL BASE TO THIGH  FASTING BLOOD GLUCOSE:  Value:  147 mg/dl  TECHNIQUE: 91.4 mCi N-82 FDG was injected intravenously. CT data was obtained and used for attenuation correction and anatomic localization only. (This was not acquired as a diagnostic CT examination.) Additional exam technical data entered on technologist worksheet.  COMPARISON:  10/01/2012  FINDINGS: NECK  No hypermetabolic lymph nodes in the neck.  Left thyroid is enlarged/nodular.  CHEST  No hypermetabolic mediastinal or hilar nodes.  No suspicious pulmonary nodules on the CT scan.  Cardiomegaly.  Right chest port.  ABDOMEN/PELVIS  Stable scattered hypodense hepatic lesions (for example, series 2/ images 13, 120, and 126), non FDG avid.  No abnormal hypermetabolic activity within the pancreas, adrenal glands, or spleen.  3.5 x 1.4 cm left para-aortic node (series 2/ image 151), previously 4.0 x 1.6 cm, max SUV 4.3.  Additional 10 mm left para-aortic node (series 2/ image 43), unchanged. Additional small peripancreatic/portacaval nodes measuring up to 9 mm short axis (series 2/image 134), non-FDG-avid.  3.2 cm posterior right upper pole renal cyst. 2 mm nonobstructing left upper pole renal calculus.  Prostatomegaly with stable asymmetry of the right posterior prostate gland (series 2/image 227), without focal hypermetabolism. Thick-walled bladder, likely reflecting chronic bladder outlet obstruction.  Small fat containing bilateral inguinal hernias  SKELETON  Multifocal heterogeneous osseous hypermetabolism. For example, focal FDG avid lesion in the posterior column of the left pelvis measures max SUV 5.9 (PET image  226), although without underlying CT correlate.  IMPRESSION: Residual 1.4 cm short axis left para-aortic node, mildly decreased in size, max SUV 4.3.  Heterogeneous osseous hypermetabolism, worrisome for lymphomatous involvement, max SUV 5.9 in the left posterior pelvis.  Stable scattered hypodense hepatic lesions, non-FDG-avid.   Electronically Signed   By: Charline Bills M.D.   On: 11/13/2012 10:47    Microbiology: Recent Results (from the past 240 hour(s))  CULTURE, BLOOD (ROUTINE X  2)     Status: None   Collection Time    11/27/12  5:40 PM      Result Value Range Status   Specimen Description BLOOD LEFT ANTECUBITAL   Final   Special Requests NONE BOTTLES DRAWN AEROBIC AND ANAEROBIC 3CC   Final   Culture  Setup Time     Final   Value: 11/27/2012 21:03     Performed at [facility]   Culture     Final   Value:        BLOOD CULTURE RECEIVED NO GROWTH TO DATE CULTURE WILL BE HELD FOR 5 DAYS BEFORE ISSUING A FINAL NEGATIVE REPORT     Performed at [facility]   Report Status PENDING   Incomplete  URINE CULTURE     Status: None   Collection Time    11/27/12  5:50 PM      Result Value Range Status   Specimen Description URINE, CLEAN CATCH   Final   Special Requests NONE   Final   Culture  Setup Time     Final   Value: 11/27/2012 21:15     Performed at [facility] Count     Final   Value: 55,000 COLONIES/ML     Performed at [facility]   Culture     Final   Value: STAPHYLOCOCCUS SPECIES (COAGULASE NEGATIVE)     Performed at [facility]   Report Status 11/30/2012 FINAL   Final   Organism ID, Bacteria STAPHYLOCOCCUS SPECIES (COAGULASE NEGATIVE)   Final  CULTURE, BLOOD (ROUTINE X 2)     Status: None   Collection Time    11/27/12  6:38 PM      Result Value Range Status   Specimen Description BLOOD RFA   Final   Special Requests NONE BOTTLES DRAWN AEROBIC AND ANAEROBIC 5CC   Final   Culture  Setup Time     Final   Value:  11/28/2012 01:42     Performed at [facility]   Culture     Final   Value:        BLOOD CULTURE RECEIVED NO GROWTH TO DATE CULTURE WILL BE HELD FOR 5 DAYS BEFORE ISSUING A FINAL NEGATIVE REPORT     Performed at [facility]   Report Status PENDING   Incomplete  CULTURE, BLOOD (SINGLE)     Status: None   Collection Time    11/27/12  8:30 PM      Result Value Range Status   Specimen Description BLOOD PORTA CATH   Final   Special Requests BOTTLES DRAWN AEROBIC AND ANAEROBIC 10CC   Final   Culture  Setup Time     Final   Value: 11/28/2012 01:28     Performed at [facility]   Culture     Final   Value:        BLOOD CULTURE RECEIVED NO GROWTH TO DATE CULTURE WILL BE HELD FOR 5 DAYS BEFORE ISSUING A FINAL NEGATIVE REPORT     Performed at [facility]   Report Status PENDING   Incomplete     Labs: Basic Metabolic Panel:  Recent Labs Lab 11/27/12 1740 11/28/12 0425 11/29/12 0420 11/30/12 0430  NA 132* 132* 138 140  K 3.9 3.6 3.3* 3.4*  CL 97 101 104 106  CO2 23 22 24 23   GLUCOSE 168* 158* 117* 163*  BUN 11 9 10 10   CREATININE 0.89 0.93  0.85 0.84  CALCIUM 9.7 8.7 8.7 9.3  MG  --   --  2.1  --    Liver Function Tests: No results found for this basename: AST, ALT, ALKPHOS, BILITOT, PROT, ALBUMIN,  in the last 168 hours No results found for this basename: LIPASE, AMYLASE,  in the last 168 hours No results found for this basename: AMMONIA,  in the last 168 hours CBC:  Recent Labs Lab 11/27/12 1740 11/28/12 0425 11/29/12 0420 11/30/12 0430  WBC 0.9* 1.2* 1.6* 2.3*  NEUTROABS 0.4*  --  1.0* 1.5*  HGB 9.1* 7.5* 9.7* 10.1*  HCT 27.7* 22.5* 29.2* 30.5*  MCV 94.5 93.8 91.5 91.3  PLT 117* 124* 156 199   Cardiac Enzymes: No results found for this basename: CKTOTAL, CKMB, CKMBINDEX, TROPONINI,  in the last 168 hours BNP: BNP (last 3 results) No results found for this basename: PROBNP,  in the last 8760 hours CBG:  Recent Labs Lab  11/29/12 0706 11/29/12 1200 11/29/12 1655 11/29/12 2127 11/30/12 0735  GLUCAP 124* 155* 178* 214* 164*       Signed:  Fisher Hargadon  [facility] 11/30/2012, 10:30 AM

## 2012-11-30 NOTE — Progress Notes (Signed)
Keith Duffy did well over the weekend. He was afebrile. All cultures were negative. He ate well. His white cell count is going up. It was 2.3 today.  He's had no nausea vomiting. There's no cough. He's had no diarrhea.  His physical exam shows vital signs are temperature 98.1 pulse 80 respiratory 16 blood pressure 151/88. Oral exam shows no mucositis. There is no adenopathy in his neck. He has no scleral icterus. Lungs are clear bilaterally. Cardiac exam regular rate and rhythm with no murmurs. Abdomen is soft. He has good bowel sounds. There is no palpable hepatospleno megaly. Extremities shows no clubbing cyanosis or edema.  His labs show Weisel count of 2.3 hemoglobin 10.1, hematocrit 30.5, and platelet count 199. His sodium is 140 potassium 3.4.  I think he can go home today. He has no changes with his medications. He does not need any new antibiotics. Given as cultures were negative, I don't that we need to make any antibiotic changes. He is on Bactrim and this is fine. He does not need allopurinol.  We will follow him up in the office per his schedule appointment.  [name]  [place] 4:5-8

## 2012-11-30 NOTE — Care Management Note (Signed)
    Page 1 of 1   11/30/2012     12:34:01 PM   CARE MANAGEMENT NOTE 11/30/2012  Patient:  [facility]   Account Number:  [id number]  Date Initiated:  11/30/2012  Documentation initiated by:  Lanier Clam  Subjective/Objective Assessment:   67 Y/O M ADMITTED W/NEUTROPENIC FEVER.     Action/Plan:   FROM HOME.   Anticipated DC Date:  11/30/2012   Anticipated DC Plan:  HOME/SELF CARE      DC Planning Services  CM consult      Choice offered to / List presented to:             Status of service:  Completed, signed off Medicare Important Message given?   (If response is "NO", the following Medicare IM given date fields will be blank) Date Medicare IM given:   Date Additional Medicare IM given:    Discharge Disposition:  HOME/SELF CARE  Per UR Regulation:  Reviewed for med. necessity/level of care/duration of stay  If discussed at Long Length of Stay Meetings, dates discussed:    Comments:  11/30/12 Makayla Confer RN,BSN NCM [phone] D/C HOME NO NEEDS OR ORDERS.

## 2012-12-03 LAB — CULTURE, BLOOD (ROUTINE X 2): Culture: NO GROWTH

## 2012-12-04 LAB — CULTURE, BLOOD (ROUTINE X 2): Culture: NO GROWTH

## 2012-12-04 LAB — CULTURE, BLOOD (SINGLE): Culture: NO GROWTH

## 2012-12-13 ENCOUNTER — Emergency Department (HOSPITAL_COMMUNITY): Payer: Medicare Other

## 2012-12-13 ENCOUNTER — Encounter (HOSPITAL_COMMUNITY): Payer: Self-pay | Admitting: Emergency Medicine

## 2012-12-13 ENCOUNTER — Emergency Department (HOSPITAL_COMMUNITY)
Admission: EM | Admit: 2012-12-13 | Discharge: 2012-12-13 | Disposition: A | Discharge status: Home / Self Care | Payer: Medicare Other | Attending: Emergency Medicine | Admitting: Emergency Medicine

## 2012-12-13 ENCOUNTER — Other Ambulatory Visit: Payer: Self-pay | Admitting: Oncology

## 2012-12-13 DIAGNOSIS — M25552 Pain in left hip: Secondary | ICD-10-CM

## 2012-12-13 DIAGNOSIS — Z794 Long term (current) use of insulin: Secondary | ICD-10-CM | POA: Insufficient documentation

## 2012-12-13 DIAGNOSIS — I10 Essential (primary) hypertension: Secondary | ICD-10-CM | POA: Insufficient documentation

## 2012-12-13 DIAGNOSIS — Z79899 Other long term (current) drug therapy: Secondary | ICD-10-CM | POA: Insufficient documentation

## 2012-12-13 DIAGNOSIS — E119 Type 2 diabetes mellitus without complications: Secondary | ICD-10-CM | POA: Insufficient documentation

## 2012-12-13 DIAGNOSIS — Z87898 Personal history of other specified conditions: Secondary | ICD-10-CM | POA: Insufficient documentation

## 2012-12-13 DIAGNOSIS — M25559 Pain in unspecified hip: Secondary | ICD-10-CM | POA: Insufficient documentation

## 2012-12-13 LAB — CBC WITH DIFFERENTIAL/PLATELET
Basophils Absolute: 0 10*3/uL (ref 0.0–0.1)
Basophils Relative: 1 % (ref 0–1)
HCT: 36.3 % — ABNORMAL LOW (ref 39.0–52.0)
Hemoglobin: 12.4 g/dL — ABNORMAL LOW (ref 13.0–17.0)
Lymphocytes Relative: 16 % (ref 12–46)
MCHC: 34.2 g/dL (ref 30.0–36.0)
Monocytes Absolute: 0.9 10*3/uL (ref 0.1–1.0)
Monocytes Relative: 17 % — ABNORMAL HIGH (ref 3–12)
Neutro Abs: 3.4 10*3/uL (ref 1.7–7.7)
Platelets: 198 10*3/uL (ref 150–400)
RDW: 15.5 % (ref 11.5–15.5)
WBC: 5.2 10*3/uL (ref 4.0–10.5)

## 2012-12-13 LAB — COMPREHENSIVE METABOLIC PANEL
ALT: 20 U/L (ref 0–53)
AST: 18 U/L (ref 0–37)
Albumin: 3.7 g/dL (ref 3.5–5.2)
CO2: 23 mEq/L (ref 19–32)
Calcium: 9.9 mg/dL (ref 8.4–10.5)
Chloride: 101 mEq/L (ref 96–112)
Creatinine, Ser: 0.76 mg/dL (ref 0.50–1.35)
GFR calc Af Amer: >90 mL/min (ref >90)
Sodium: 136 mEq/L (ref 135–145)
Total Bilirubin: 0.4 mg/dL (ref 0.3–1.2)

## 2012-12-13 MED ORDER — HEPARIN SOD (PORK) LOCK FLUSH 100 UNIT/ML IV SOLN
500.0000 [IU] | Freq: Once | INTRAVENOUS | Status: AC
  Administered 2012-12-13: 500 [IU]
  Filled 2012-12-13: qty 5

## 2012-12-13 MED ORDER — HYDROMORPHONE HCL PF 2 MG/ML IJ SOLN
2.0000 mg | INTRAMUSCULAR | Status: DC | PRN
  Administered 2012-12-13: 2 mg via INTRAVENOUS
  Filled 2012-12-13: qty 1

## 2012-12-13 MED ORDER — ONDANSETRON HCL 4 MG/2ML IJ SOLN
4.0000 mg | Freq: Once | INTRAMUSCULAR | Status: AC
  Administered 2012-12-13: 4 mg via INTRAVENOUS
  Filled 2012-12-13: qty 2

## 2012-12-13 MED ORDER — ONDANSETRON 4 MG PO TBDP
4.0000 mg | ORAL_TABLET | Freq: Once | ORAL | Status: AC
  Administered 2012-12-13: 4 mg via ORAL
  Filled 2012-12-13: qty 1

## 2012-12-13 MED ORDER — SODIUM CHLORIDE 0.9 % IV BOLUS (SEPSIS)
1000.0000 mL | Freq: Once | INTRAVENOUS | Status: AC
  Administered 2012-12-13: 1000 mL via INTRAVENOUS

## 2012-12-13 MED ORDER — HYDROMORPHONE HCL 2 MG PO TABS: 1.0000 mg | ORAL_TABLET | ORAL | Status: DC | PRN

## 2012-12-13 NOTE — ED Notes (Signed)
Pt from home reports L hip that has been hurting off/on in the past month, worse past 2 days. Pt is unable to sleep. Pt is A&O and in NAD. Pt is a Lymphoma pt, last chemo 11/18/12

## 2012-12-13 NOTE — ED Provider Notes (Signed)
CSN: [id number]     Arrival date & time 12/13/12  1240 History   First MD Initiated Contact with Patient 12/13/12 1243     Chief Complaint  Patient presents with  . Hip Pain    left hip   (Consider location/radiation/quality/duration/timing/severity/associated sxs/prior Treatment) HPI Patient presents with pain in his left hip.  The pain is deep in the medial gluteus area.  It is worse with ambulation or any motion of the hip. Pain has been present for approximately one month, but worsening over the past days. Minimal relief pulmonary chronic use. Patient has a notable history of lymphoma, for which he is currently getting chemotherapy.  Last session was one month ago. Patient denies no weakness in the distal left lower extremity, but there is mild dysesthesia in the area. No new fevers, chills, incontinence.  Past Medical History  Diagnosis Date  . Back pain   . Hypertension   . Diabetes mellitus without complication   . Cancer     non hodkins lymphoma   History reviewed. No pertinent past surgical history. Family History  Problem Relation Age of Onset  . Heart attack Father   . Heart attack Maternal Grandfather    History  Substance Use Topics  . Smoking status: Never Smoker   . Smokeless tobacco: Not on file  . Alcohol Use: No    Review of Systems  Constitutional:       Per HPI, otherwise negative  HENT:       Per HPI, otherwise negative  Respiratory:       Per HPI, otherwise negative  Cardiovascular:       Per HPI, otherwise negative  Gastrointestinal: Negative for vomiting.  Endocrine:       Negative aside from HPI  Genitourinary:       Neg aside from HPI   Musculoskeletal:       Per HPI, otherwise negative  Skin: Negative.   Neurological: Negative for syncope.    Allergies  Aspartame and phenylalanine and Codeine  Home Medications   Current Outpatient Rx  Name  Route  Sig  Dispense  Refill  . acetaminophen (TYLENOL) 500 MG tablet   Oral   Take  1,000 mg by mouth every 6 (six) hours as needed for pain.         . cyclobenzaprine (FLEXERIL) 10 MG tablet   Oral   Take 10 mg by mouth 3 (three) times daily as needed for muscle spasms.         . ENSURE PLUS (ENSURE PLUS) LIQD   Oral   Take 237 mLs by mouth daily.         . famciclovir (FAMVIR) 500 MG tablet   Oral   Take 0.5 tablets (250 mg total) by mouth daily.   30 tablet   0   . insulin aspart (NOVOLOG FLEXPEN) 100 UNIT/ML SOPN FlexPen   Subcutaneous   Inject 0-9 Units into the skin 3 (three) times daily with meals. CBG < 70: eat or drink something sweet & recheck blood sugar. CBG 70 - 120: 0 units  CBG 121 - 150: 1 unit  CBG 151 - 200: 2 units  CBG 201 - 250: 3 units  CBG 251 - 300: 5 units  CBG 301 - 350: 7 units  CBG 351 - 400: 9 units  CBG > 400: call MD   5 pen   0   . Insulin Glargine (LANTUS SOLOSTAR) 100 UNIT/ML SOPN   Subcutaneous  Inject 25 Units into the skin every evening.   1 pen   0   . lidocaine-prilocaine (EMLA) cream   Topical   Apply 1 application topically as needed (for port).         . metoprolol tartrate (LOPRESSOR) 25 MG tablet   Oral   Take 1 tablet (25 mg total) by mouth 2 (two) times daily.   60 tablet   0   . ondansetron (ZOFRAN) 8 MG tablet   Oral   Take 1 tablet (8 mg total) by mouth 2 (two) times daily. Take this beginning 1 day after chemotherapy.  Take this for 3 days after each chemotherapy.   20 tablet   3   . oxyCODONE (OXY IR/ROXICODONE) 5 MG immediate release tablet   Oral   Take 5 mg by mouth every 4 (four) hours as needed for pain.         Marland Kitchen prochlorperazine (COMPAZINE) 10 MG tablet   Oral   Take 10 mg by mouth every 6 (six) hours as needed (nausea).         . sulfamethoxazole-trimethoprim (BACTRIM DS,SEPTRA DS) 800-160 MG per tablet   Oral   Take 1 tablet by mouth every Monday, Wednesday, and Friday. Resume in 4 days.          BP 166/89  Pulse 80  Temp(Src) 97.7 F (36.5 C) (Oral)  Resp  20  SpO2 100% Physical Exam  Nursing note and vitals reviewed. Constitutional: He is oriented to person, place, and time. He appears well-developed. No distress.  HENT:  Head: Normocephalic and atraumatic.  Eyes: Conjunctivae and EOM are normal.  Cardiovascular: Normal rate and regular rhythm.   Pulmonary/Chest: Effort normal. No stridor. No respiratory distress.  Abdominal: He exhibits no distension.  Musculoskeletal: He exhibits no edema.       Legs: Left knee, left ankle unremarkable. Left hip has no tenderness to with axial loading, no loss of range of motion or strength in any dimensions.   Neurological: He is alert and oriented to person, place, and time.  Skin: Skin is warm and dry.  Psychiatric: He has a normal mood and affect.    ED Course  Procedures (including critical care time) Labs Review Labs Reviewed  CBC WITH DIFFERENTIAL  COMPREHENSIVE METABOLIC PANEL   Imaging Review Dg Hip Complete Left  12/13/2012   CLINICAL DATA:  Pain  EXAM: LEFT HIP - COMPLETE 2+ VIEW  COMPARISON:  None.  FINDINGS: Frontal pelvis as well as frontal and lateral left hip images were obtained. There is mild symmetric narrowing of both hip joints. No fracture or dislocation. No erosive change.  IMPRESSION: No fracture or dislocation. Mild symmetric narrowing of both hip joints.   Electronically Signed   By: Bretta Bang M.D.   On: 12/13/2012 13:36    EKG Interpretation   None      After the initial evaluation I reviewed the patient's chart.  Patient has notable diffuse lymphoma lesions.  3:35 PM On repeat exam the patient is a substantially better in appearance.  He denies current pain.  We discussed all results.  Patient will follow up with his oncologist tomorrow via telephone to have chemotherapy in 3 days. MDM  No diagnosis found. Patient presents with worsening left hip pain.  On exam the patient is awake and alert, but has severe pain.  Patient is distally neurovascularly  intact.  With the patient's oncologic history, there is concern for lytic lesions.  CT, x-ray were  reassuring.  The patient had substantial improvement in his condition with analgesics, labs, vital signs were reassuring. With resolution of the pain, the patient is appropriate for discharge with outpatient followup   Gerhard Munch, MD 12/13/12 1536

## 2012-12-13 NOTE — ED Notes (Signed)
Cancer pt has known lesion and was referred here to be evaluated by EDP. Pt has left hip pain.

## 2012-12-13 NOTE — ED Notes (Signed)
MD at bedside. 

## 2012-12-14 ENCOUNTER — Other Ambulatory Visit: Payer: Self-pay | Admitting: Oncology

## 2012-12-15 ENCOUNTER — Ambulatory Visit (HOSPITAL_BASED_OUTPATIENT_CLINIC_OR_DEPARTMENT_OTHER): Payer: Medicare Other | Admitting: Hematology & Oncology

## 2012-12-15 ENCOUNTER — Other Ambulatory Visit (HOSPITAL_BASED_OUTPATIENT_CLINIC_OR_DEPARTMENT_OTHER): Payer: Medicare Other | Admitting: Lab

## 2012-12-15 ENCOUNTER — Other Ambulatory Visit: Payer: Self-pay | Admitting: *Deleted

## 2012-12-15 ENCOUNTER — Ambulatory Visit (HOSPITAL_BASED_OUTPATIENT_CLINIC_OR_DEPARTMENT_OTHER)
Admission: RE | Admit: 2012-12-15 | Discharge: 2012-12-15 | Disposition: A | Discharge status: Home / Self Care | Payer: Medicare Other | Source: Ambulatory Visit | Attending: Hematology & Oncology | Admitting: Hematology & Oncology

## 2012-12-15 ENCOUNTER — Ambulatory Visit (HOSPITAL_BASED_OUTPATIENT_CLINIC_OR_DEPARTMENT_OTHER): Payer: Medicare Other

## 2012-12-15 VITALS — BP 156/86 | HR 78 | Temp 97.2°F | Resp 18

## 2012-12-15 DIAGNOSIS — E1165 Type 2 diabetes mellitus with hyperglycemia: Secondary | ICD-10-CM

## 2012-12-15 DIAGNOSIS — Z5112 Encounter for antineoplastic immunotherapy: Secondary | ICD-10-CM

## 2012-12-15 DIAGNOSIS — M25552 Pain in left hip: Secondary | ICD-10-CM

## 2012-12-15 DIAGNOSIS — C8590 Non-Hodgkin lymphoma, unspecified, unspecified site: Secondary | ICD-10-CM

## 2012-12-15 DIAGNOSIS — IMO0002 Reserved for concepts with insufficient information to code with codable children: Secondary | ICD-10-CM

## 2012-12-15 DIAGNOSIS — Z5111 Encounter for antineoplastic chemotherapy: Secondary | ICD-10-CM

## 2012-12-15 DIAGNOSIS — C799 Secondary malignant neoplasm of unspecified site: Secondary | ICD-10-CM

## 2012-12-15 DIAGNOSIS — C8589 Other specified types of non-Hodgkin lymphoma, extranodal and solid organ sites: Secondary | ICD-10-CM

## 2012-12-15 DIAGNOSIS — M25559 Pain in unspecified hip: Secondary | ICD-10-CM

## 2012-12-15 DIAGNOSIS — E119 Type 2 diabetes mellitus without complications: Secondary | ICD-10-CM

## 2012-12-15 LAB — CMP (CANCER CENTER ONLY)
Alkaline Phosphatase: 121 U/L — ABNORMAL HIGH (ref 26–84)
BUN, Bld: 14 mg/dL (ref 7–22)
CO2: 29 mEq/L (ref 18–33)
Calcium: 9.7 mg/dL (ref 8.0–10.3)
Creat: 0.9 mg/dl (ref 0.6–1.2)
Glucose, Bld: 184 mg/dL — ABNORMAL HIGH (ref 73–118)
Total Bilirubin: 0.6 mg/dl (ref 0.20–1.60)
Total Protein: 7.4 g/dL (ref 6.4–8.1)

## 2012-12-15 LAB — CBC WITH DIFFERENTIAL (CANCER CENTER ONLY)
BASO#: 0.1 10*3/uL (ref 0.0–0.2)
EOS%: 0.8 % (ref 0.0–7.0)
Eosinophils Absolute: 0 10*3/uL (ref 0.0–0.5)
HGB: 13.8 g/dL (ref 13.0–17.1)
LYMPH#: 0.7 10*3/uL — ABNORMAL LOW (ref 0.9–3.3)
LYMPH%: 12.8 % — ABNORMAL LOW (ref 14.0–48.0)
MCH: 30.7 pg (ref 28.0–33.4)
MCHC: 32.5 g/dL (ref 32.0–35.9)
MCV: 94 fL (ref 82–98)
MONO%: 13.7 % — ABNORMAL HIGH (ref 0.0–13.0)
NEUT%: 71.7 % (ref 40.0–80.0)
RBC: 4.49 10*6/uL (ref 4.20–5.70)
RDW: 15.8 % — ABNORMAL HIGH (ref 11.1–15.7)

## 2012-12-15 MED ORDER — SODIUM CHLORIDE 0.9 % IV SOLN
375.0000 mg/m2 | Freq: Once | INTRAVENOUS | Status: AC
  Administered 2012-12-15: 700 mg via INTRAVENOUS
  Filled 2012-12-15: qty 70

## 2012-12-15 MED ORDER — SODIUM CHLORIDE 0.9 % IV SOLN
750.0000 mg/m2 | Freq: Once | INTRAVENOUS | Status: AC
  Administered 2012-12-15: 1440 mg via INTRAVENOUS
  Filled 2012-12-15: qty 72

## 2012-12-15 MED ORDER — DIPHENHYDRAMINE HCL 25 MG PO CAPS
50.0000 mg | ORAL_CAPSULE | Freq: Once | ORAL | Status: AC
  Administered 2012-12-15: 25 mg via ORAL

## 2012-12-15 MED ORDER — HEPARIN SOD (PORK) LOCK FLUSH 100 UNIT/ML IV SOLN
500.0000 [IU] | Freq: Once | INTRAVENOUS | Status: AC | PRN
  Administered 2012-12-15: 500 [IU]
  Filled 2012-12-15: qty 5

## 2012-12-15 MED ORDER — ACETAMINOPHEN 325 MG PO TABS
ORAL_TABLET | ORAL | Status: AC
  Filled 2012-12-15: qty 2

## 2012-12-15 MED ORDER — ACETAMINOPHEN 325 MG PO TABS
650.0000 mg | ORAL_TABLET | Freq: Once | ORAL | Status: AC
  Administered 2012-12-15: 650 mg via ORAL

## 2012-12-15 MED ORDER — SODIUM CHLORIDE 0.9 % IV SOLN
Freq: Once | INTRAVENOUS | Status: AC
  Administered 2012-12-15: 13:00:00 via INTRAVENOUS

## 2012-12-15 MED ORDER — SODIUM CHLORIDE 0.9 % IJ SOLN
10.0000 mL | INTRAMUSCULAR | Status: DC | PRN
  Administered 2012-12-15: 10 mL
  Filled 2012-12-15: qty 10

## 2012-12-15 MED ORDER — ONDANSETRON 16 MG/50ML IVPB (CHCC)
16.0000 mg | Freq: Once | INTRAVENOUS | Status: AC
  Administered 2012-12-15: 16 mg via INTRAVENOUS

## 2012-12-15 MED ORDER — DIPHENHYDRAMINE HCL 25 MG PO CAPS
ORAL_CAPSULE | ORAL | Status: AC
  Filled 2012-12-15: qty 1

## 2012-12-15 MED ORDER — DEXAMETHASONE SODIUM PHOSPHATE 20 MG/5ML IJ SOLN
INTRAMUSCULAR | Status: AC
  Filled 2012-12-15: qty 5

## 2012-12-15 MED ORDER — VINCRISTINE SULFATE CHEMO INJECTION 1 MG/ML
2.0000 mg | Freq: Once | INTRAVENOUS | Status: AC
  Administered 2012-12-15: 2 mg via INTRAVENOUS
  Filled 2012-12-15: qty 2

## 2012-12-15 MED ORDER — GADOBENATE DIMEGLUMINE 529 MG/ML IV SOLN: 20.0000 mL | Freq: Once | INTRAVENOUS | Status: AC | PRN

## 2012-12-15 MED ORDER — KETOROLAC TROMETHAMINE 15 MG/ML IJ SOLN
INTRAMUSCULAR | Status: AC
  Filled 2012-12-15: qty 2

## 2012-12-15 MED ORDER — ONDANSETRON 16 MG/50ML IVPB (CHCC)
INTRAVENOUS | Status: AC
  Filled 2012-12-15: qty 16

## 2012-12-15 MED ORDER — DOXORUBICIN HCL CHEMO IV INJECTION 2 MG/ML
50.0000 mg/m2 | Freq: Once | INTRAVENOUS | Status: AC
  Administered 2012-12-15: 96 mg via INTRAVENOUS
  Filled 2012-12-15: qty 48

## 2012-12-15 MED ORDER — MELOXICAM 15 MG PO TABS: 15.0000 mg | ORAL_TABLET | Freq: Every day | ORAL | Status: DC

## 2012-12-15 MED ORDER — KETOROLAC TROMETHAMINE 30 MG/ML IJ SOLN
30.0000 mg | Freq: Once | INTRAMUSCULAR | Status: AC
  Administered 2012-12-15: 30 mg via INTRAVENOUS
  Filled 2012-12-15: qty 1

## 2012-12-15 MED ORDER — DEXAMETHASONE SODIUM PHOSPHATE 20 MG/5ML IJ SOLN
20.0000 mg | Freq: Once | INTRAMUSCULAR | Status: AC
  Administered 2012-12-15: 20 mg via INTRAVENOUS

## 2012-12-15 NOTE — Progress Notes (Signed)
This office note has been dictated.

## 2012-12-15 NOTE — Progress Notes (Signed)
Pt's wife called stating he was continuing to have low back pain despite the use of Dilaudid and Flexeril. All is seems to do is make him sedated. Reviewed with Dr [name]. To come in today as a work in for labs and MD eval. To cx appt for tomorrow.

## 2012-12-15 NOTE — Patient Instructions (Signed)
Prednisone tablets What is this medicine? PREDNISONE (PRED ni sone) is a corticosteroid. It is commonly used to treat inflammation of the skin, joints, lungs, and other organs. Common conditions treated include asthma, allergies, and arthritis. It is also used for other conditions, such as blood disorders and diseases of the adrenal glands. This medicine may be used for other purposes; ask your health care provider or pharmacist if you have questions. What should I tell my health care provider before I take this medicine? They need to know if you have any of these conditions: -Cushing's syndrome -diabetes -glaucoma -heart disease -high blood pressure -infection (especially a virus infection such as chickenpox, cold sores, or herpes) -kidney disease -liver disease -mental illness -myasthenia gravis -osteoporosis -seizures -stomach or intestine problems -thyroid disease -an unusual or allergic reaction to lactose, prednisone, other medicines, foods, dyes, or preservatives -pregnant or trying to get pregnant -breast-feeding How should I use this medicine? Take this medicine by mouth with a glass of water. Follow the directions on the prescription label. Take this medicine with food. If you are taking this medicine once a day, take it in the morning. Do not take more medicine than you are told to take. Do not suddenly stop taking your medicine because you may develop a severe reaction. Your doctor will tell you how much medicine to take. If your doctor wants you to stop the medicine, the dose may be slowly lowered over time to avoid any side effects. Talk to your pediatrician regarding the use of this medicine in children. Special care may be needed. Overdosage: If you think you have taken too much of this medicine contact a poison control center or emergency room at once. NOTE: This medicine is only for you. Do not share this medicine with others. What if I miss a dose? If you miss a dose,  take it as soon as you can. If it is almost time for your next dose, talk to your doctor or health care professional. You may need to miss a dose or take an extra dose. Do not take double or extra doses without advice. What may interact with this medicine? Do not take this medicine with any of the following medications: -metyrapone -mifepristone This medicine may also interact with the following medications: -aminoglutethimide -amphotericin B -aspirin and aspirin-like medicines -barbiturates -certain medicines for diabetes, like glipizide or glyburide -cholestyramine -cholinesterase inhibitors -cyclosporine -digoxin -diuretics -ephedrine -male hormones, like estrogens and birth control pills -isoniazid -ketoconazole -NSAIDS, medicines for pain and inflammation, like ibuprofen or naproxen -phenytoin -rifampin -toxoids -vaccines -warfarin This list may not describe all possible interactions. Give your health care provider a list of all the medicines, herbs, non-prescription drugs, or dietary supplements you use. Also tell them if you smoke, drink alcohol, or use illegal drugs. Some items may interact with your medicine. What should I watch for while using this medicine? Visit your doctor or health care professional for regular checks on your progress. If you are taking this medicine over a prolonged period, carry an identification card with your name and address, the type and dose of your medicine, and your doctor's name and address. This medicine may increase your risk of getting an infection. Tell your doctor or health care professional if you are around anyone with measles or chickenpox, or if you develop sores or blisters that do not heal properly. If you are going to have surgery, tell your doctor or health care professional that you have taken this medicine within the last   twelve months. Ask your doctor or health care professional about your diet. You may need to lower the amount  of salt you eat. This medicine may affect blood sugar levels. If you have diabetes, check with your doctor or health care professional before you change your diet or the dose of your diabetic medicine. What side effects may I notice from receiving this medicine? Side effects that you should report to your doctor or health care professional as soon as possible: -allergic reactions like skin rash, itching or hives, swelling of the face, lips, or tongue -changes in emotions or moods -changes in vision -depressed mood -eye pain -fever or chills, cough, sore throat, pain or difficulty passing urine -increased thirst -swelling of ankles, feet Side effects that usually do not require medical attention (report to your doctor or health care professional if they continue or are bothersome): -confusion, excitement, restlessness -headache -nausea, vomiting -skin problems, acne, thin and shiny skin -trouble sleeping -weight gain This list may not describe all possible side effects. Call your doctor for medical advice about side effects. You may report side effects to FDA at 1-800-FDA-1088. Where should I keep my medicine? Keep out of the reach of children. Store at room temperature between 15 and 30 degrees C (59 and 86 degrees F). Protect from light. Keep container tightly closed. Throw away any unused medicine after the expiration date. NOTE: This sheet is a summary. It may not cover all possible information. If you have questions about this medicine, talk to your doctor, pharmacist, or health care provider.  2012, [employer]. (09/20/2010 10:57:14 AM)Vincristine injection What is this medicine?       VINCRISTINE (vin KRIS teen) is a chemotherapy drug. It slows the growth of cancer cells. This medicine is used to treat many types of cancer like Hodgkin's disease, leukemia, non-Hodgkin's lymphoma, neuroblastoma (brain cancer), rhabdomyosarcoma, and Wilms' tumor. This medicine may be  used for other purposes; ask your health care provider or pharmacist if you have questions. What should I tell my health care provider before I take this medicine? They need to know if you have any of these conditions: -blood disorders -gout -infection (especially chickenpox, cold sores, or herpes) -kidney disease -liver disease -lung disease -nervous system disease like Charcot-Marie-Tooth (CMT) -recent or ongoing radiation therapy -an unusual or allergic reaction to vincristine, other chemotherapy agents, other medicines, foods, dyes, or preservatives -pregnant or trying to get pregnant -breast-feeding How should I use this medicine? This drug is given as an infusion into a vein. It is administered in a hospital or clinic by a specially trained health care professional. If you have pain, swelling, burning, or any unusual feeling around the site of your injection, tell your health care professional right away. Talk to your pediatrician regarding the use of this medicine in children. While this drug may be prescribed for selected conditions, precautions do apply. Overdosage: If you think you have taken too much of this medicine contact a poison control center or emergency room at once. NOTE: This medicine is only for you. Do not share this medicine with others. What if I miss a dose? It is important not to miss your dose. Call your doctor or health care professional if you are unable to keep an appointment. What may interact with this medicine? Do not take this medicine with any of the following medications: -itraconazole -mibefradil -voriconazole This medicine may also interact with the following medications: -cyclosporine -erythromycin -fluconazole -ketoconazole -medicines for HIV like delavirdine, efavirenz, nevirapine -medicines   for seizures like ethotoin, fosphenotoin, phenytoin -medicines to increase blood counts like filgrastim, pegfilgrastim, sargramostim -other chemotherapy  drugs like cisplatin, L-asparaginase, methotrexate, mitomycin, paclitaxel -pegaspargase -vaccines -zalcitabine, ddC Talk to your doctor or health care professional before taking any of these medicines: -acetaminophen -aspirin -ibuprofen -ketoprofen -naproxen This list may not describe all possible interactions. Give your health care provider a list of all the medicines, herbs, non-prescription drugs, or dietary supplements you use. Also tell them if you smoke, drink alcohol, or use illegal drugs. Some items may interact with your medicine. What should I watch for while using this medicine? Your condition will be monitored carefully while you are receiving this medicine. You will need important blood work done while you are taking this medicine. This drug may make you feel generally unwell. This is not uncommon, as chemotherapy can affect healthy cells as well as cancer cells. Report any side effects. Continue your course of treatment even though you feel ill unless your doctor tells you to stop. In some cases, you may be given additional medicines to help with side effects. Follow all directions for their use. Call your doctor or health care professional for advice if you get a fever, chills or sore throat, or other symptoms of a cold or flu. Do not treat yourself. Avoid taking products that contain aspirin, acetaminophen, ibuprofen, naproxen, or ketoprofen unless instructed by your doctor. These medicines may hide a fever. Do not become pregnant while taking this medicine. Women should inform their doctor if they wish to become pregnant or think they might be pregnant. There is a potential for serious side effects to an unborn child. Talk to your health care professional or pharmacist for more information. Do not breast-feed an infant while taking this medicine. Men may have a lower sperm count while taking this medicine. Talk to your doctor if you plan to father a child. What side effects may I  notice from receiving this medicine? Side effects that you should report to your doctor or health care professional as soon as possible: -allergic reactions like skin rash, itching or hives, swelling of the face, lips, or tongue -breathing problems -confusion or changes in emotions or moods -constipation -cough -mouth sores -muscle weakness -nausea and vomiting -pain, swelling, redness or irritation at the injection site -pain, tingling, numbness in the hands or feet -problems with balance, talking, walking -seizures -stomach pain -trouble passing urine or change in the amount of urine Side effects that usually do not require medical attention (report to your doctor or health care professional if they continue or are bothersome): -diarrhea -hair loss -jaw pain -loss of appetite This list may not describe all possible side effects. Call your doctor for medical advice about side effects. You may report side effects to FDA at 1-800-FDA-1088. Where should I keep my medicine? This drug is given in a hospital or clinic and will not be stored at home. NOTE: This sheet is a summary. It may not cover all possible information. If you have questions about this medicine, talk to your doctor, pharmacist, or health care provider.  2012, [employer]. (11/02/2007 5:17:13 PM)     Cyclophosphamide injection What is this medicine?     CYCLOPHOSPHAMIDE (sye kloe FOSS fa mide) is a chemotherapy drug. It slows the growth of cancer cells. This medicine is used to treat many types of cancer like lymphoma, myeloma, leukemia, breast cancer, and ovarian cancer, to name a few. It is also used to treat nephrotic syndrome in children. This   medicine may be used for other purposes; ask your health care provider or pharmacist if you have questions. What should I tell my health care provider before I take this medicine? They need to know if you have any of these conditions: -blood  disorders -history of other chemotherapy -history of radiation therapy -infection -kidney disease -liver disease -tumors in the bone marrow -an unusual or allergic reaction to cyclophosphamide, other chemotherapy, other medicines, foods, dyes, or preservatives -pregnant or trying to get pregnant -breast-feeding How should I use this medicine? This drug is usually given as an injection into a vein or muscle or by infusion into a vein. It is administered in a hospital or clinic by a specially trained health care professional. Talk to your pediatrician regarding the use of this medicine in children. While this drug may be prescribed for selected conditions, precautions do apply. Overdosage: If you think you have taken too much of this medicine contact a poison control center or emergency room at once. NOTE: This medicine is only for you. Do not share this medicine with others. What if I miss a dose? It is important not to miss your dose. Call your doctor or health care professional if you are unable to keep an appointment. What may interact with this medicine? Do not take this medicine with any of the following medications: -mibefradil -nalidixic acid This medicine may also interact with the following medications: -doxorubicin -etanercept -medicines to increase blood counts like filgrastim, pegfilgrastim, sargramostim -medicines that block muscle or nerve pain -St. John's Wort -phenobarbital -succinylcholine chloride -trastuzumab -vaccines Talk to your doctor or health care professional before taking any of these medicines: -acetaminophen -aspirin -ibuprofen -ketoprofen -naproxen This list may not describe all possible interactions. Give your health care provider a list of all the medicines, herbs, non-prescription drugs, or dietary supplements you use. Also tell them if you smoke, drink alcohol, or use illegal drugs. Some items may interact with your medicine. What should I watch  for while using this medicine? Visit your doctor for checks on your progress. This drug may make you feel generally unwell. This is not uncommon, as chemotherapy can affect healthy cells as well as cancer cells. Report any side effects. Continue your course of treatment even though you feel ill unless your doctor tells you to stop. Drink water or other fluids as directed. Urinate often, even at night. In some cases, you may be given additional medicines to help with side effects. Follow all directions for their use. Call your doctor or health care professional for advice if you get a fever, chills or sore throat, or other symptoms of a cold or flu. Do not treat yourself. This drug decreases your body's ability to fight infections. Try to avoid being around people who are sick. This medicine may increase your risk to bruise or bleed. Call your doctor or health care professional if you notice any unusual bleeding. Be careful brushing and flossing your teeth or using a toothpick because you may get an infection or bleed more easily. If you have any dental work done, tell your dentist you are receiving this medicine. Avoid taking products that contain aspirin, acetaminophen, ibuprofen, naproxen, or ketoprofen unless instructed by your doctor. These medicines may hide a fever. Do not become pregnant while taking this medicine. Women should inform their doctor if they wish to become pregnant or think they might be pregnant. There is a potential for serious side effects to an unborn child. Talk to your health care professional   or pharmacist for more information. Do not breast-feed an infant while taking this medicine. Men should inform their doctor if they wish to father a child. This medicine may lower sperm counts. If you are going to have surgery, tell your doctor or health care professional that you have taken this medicine. What side effects may I notice from receiving this medicine? Side effects that you  should report to your doctor or health care professional as soon as possible: -allergic reactions like skin rash, itching or hives, swelling of the face, lips, or tongue -low blood counts - this medicine may decrease the number of white blood cells, red blood cells and platelets. You may be at increased risk for infections and bleeding. -signs of infection - fever or chills, cough, sore throat, pain or difficulty passing urine -signs of decreased platelets or bleeding - bruising, pinpoint red spots on the skin, black, tarry stools, blood in the urine -signs of decreased red blood cells - unusually weak or tired, fainting spells, lightheadedness -breathing problems -dark urine -mouth sores -pain, swelling, redness at site where injected -swelling of the ankles, feet, hands -trouble passing urine or change in the amount of urine -weight gain -yellowing of the eyes or skin Side effects that usually do not require medical attention (report to your doctor or health care professional if they continue or are bothersome): -changes in nail or skin color -diarrhea -hair loss -loss of appetite -missed menstrual periods -nausea, vomiting -stomach pain This list may not describe all possible side effects. Call your doctor for medical advice about side effects. You may report side effects to FDA at 1-800-FDA-1088. Where should I keep my medicine? This drug is given in a hospital or clinic and will not be stored at home. NOTE: This sheet is a summary. It may not cover all possible information. If you have questions about this medicine, talk to your doctor, pharmacist, or health care provider.  2013, [employer]. (05/12/2007 2:32:25 PM)     Doxorubicin injection What is this medicine?  DOXORUBICIN (dox oh ROO bi sin) is a chemotherapy drug. It is used to treat many kinds of cancer like Hodgkin's disease, leukemia, non-Hodgkin's lymphoma, neuroblastoma, sarcoma, and Wilms' tumor. It is  also used to treat bladder cancer, breast cancer, lung cancer, ovarian cancer, stomach cancer, and thyroid cancer. This medicine may be used for other purposes; ask your health care provider or pharmacist if you have questions. What should I tell my health care provider before I take this medicine? They need to know if you have any of these conditions: -blood disorders -heart disease, recent heart attack -infection (especially a virus infection such as chickenpox, cold sores, or herpes) -irregular heartbeat -liver disease -recent or ongoing radiation therapy -an unusual or allergic reaction to doxorubicin, other chemotherapy agents, other medicines, foods, dyes, or preservatives -pregnant or trying to get pregnant -breast-feeding How should I use this medicine? This drug is given as an infusion into a vein. It is administered in a hospital or clinic by a specially trained health care professional. If you have pain, swelling, burning or any unusual feeling around the site of your injection, tell your health care professional right away. Talk to your pediatrician regarding the use of this medicine in children. Special care may be needed. Overdosage: If you think you have taken too much of this medicine contact a poison control center or emergency room at once. NOTE: This medicine is only for you. Do not share this medicine with others.   What if I miss a dose? It is important not to miss your dose. Call your doctor or health care professional if you are unable to keep an appointment. What may interact with this medicine? Do not take this medicine with any of the following medications: -cisapride -droperidol -halofantrine -pimozide -zidovudine This medicine may also interact with the following medications: -chloroquine -chlorpromazine -clarithromycin -cyclophosphamide -cyclosporine -erythromycin -medicines for depression, anxiety, or psychotic disturbances -medicines for irregular heart  beat like amiodarone, bepridil, dofetilide, encainide, flecainide, propafenone, quinidine -medicines for seizures like ethotoin, fosphenytoin, phenytoin -medicines for nausea, vomiting like dolasetron, ondansetron, palonosetron -medicines to increase blood counts like filgrastim, pegfilgrastim, sargramostim -methadone -methotrexate -pentamidine -progesterone -vaccines -verapamil Talk to your doctor or health care professional before taking any of these medicines: -acetaminophen -aspirin -ibuprofen -ketoprofen -naproxen This list may not describe all possible interactions. Give your health care provider a list of all the medicines, herbs, non-prescription drugs, or dietary supplements you use. Also tell them if you smoke, drink alcohol, or use illegal drugs. Some items may interact with your medicine. What should I watch for while using this medicine? Your condition will be monitored carefully while you are receiving this medicine. You will need important blood work done while you are taking this medicine. This drug may make you feel generally unwell. This is not uncommon, as chemotherapy can affect healthy cells as well as cancer cells. Report any side effects. Continue your course of treatment even though you feel ill unless your doctor tells you to stop. Your urine may turn red for a few days after your dose. This is not blood. If your urine is dark or brown, call your doctor. In some cases, you may be given additional medicines to help with side effects. Follow all directions for their use. Call your doctor or health care professional for advice if you get a fever, chills or sore throat, or other symptoms of a cold or flu. Do not treat yourself. This drug decreases your body's ability to fight infections. Try to avoid being around people who are sick. This medicine may increase your risk to bruise or bleed. Call your doctor or health care professional if you notice any unusual bleeding. Be  careful brushing and flossing your teeth or using a toothpick because you may get an infection or bleed more easily. If you have any dental work done, tell your dentist you are receiving this medicine. Avoid taking products that contain aspirin, acetaminophen, ibuprofen, naproxen, or ketoprofen unless instructed by your doctor. These medicines may hide a fever. Men and women of childbearing age should use effective birth control methods while using taking this medicine. Do not become pregnant while taking this medicine. There is a potential for serious side effects to an unborn child. Talk to your health care professional or pharmacist for more information. Do not breast-feed an infant while taking this medicine. Do not let others touch your urine or other body fluids for 5 days after each treatment with this medicine. Caregivers should wear latex gloves to avoid touching body fluids during this time. What side effects may I notice from receiving this medicine? Side effects that you should report to your doctor or health care professional as soon as possible: -allergic reactions like skin rash, itching or hives, swelling of the face, lips, or tongue -low blood counts - this medicine may decrease the number of white blood cells, red blood cells and platelets. You may be at increased risk for infections and bleeding. -signs of infection -   fever or chills, cough, sore throat, pain or difficulty passing urine -signs of decreased platelets or bleeding - bruising, pinpoint red spots on the skin, black, tarry stools, blood in the urine -signs of decreased red blood cells - unusually weak or tired, fainting spells, lightheadedness -breathing problems -chest pain -fast, irregular heartbeat -mouth sores -nausea, vomiting -pain, swelling, redness at site where injected -pain, tingling, numbness in the hands or feet -swelling of ankles, feet, or hands -unusual bleeding or bruising Side effects that usually  do not require medical attention (report to your doctor or health care professional if they continue or are bothersome): -diarrhea -facial flushing -hair loss -loss of appetite -missed menstrual periods -nail discoloration or damage -red or watery eyes -red colored urine -stomach upset This list may not describe all possible side effects. Call your doctor for medical advice about side effects. You may report side effects to FDA at 1-800-FDA-1088. Where should I keep my medicine? This drug is given in a hospital or clinic and will not be stored at home. NOTE: This sheet is a summary. It may not cover all possible information. If you have questions about this medicine, talk to your doctor, pharmacist, or health care provider.  2013, [employer]. (05/26/2007 5:07:32 PM)      Rituximab injection What is this medicine? RITUXIMAB (ri TUX i mab) is a monoclonal antibody. This medicine changes the way the body's immune system works. It is used commonly to treat non-Hodgkin's lymphoma and other conditions. In cancer cells, this drug targets a specific protein within cancer cells and stops the cancer cells from growing. It is also used to treat rhuematoid arthritis (RA). In RA, this medicine slow the inflammatory process and help reduce joint pain and swelling. This medicine is often used with other cancer or arthritis medications. This medicine may be used for other purposes; ask your health care provider or pharmacist if you have questions. What should I tell my health care provider before I take this medicine? They need to know if you have any of these conditions: -blood disorders -heart disease -history of hepatitis B -infection (especially a virus infection such as chickenpox, cold sores, or herpes) -irregular heartbeat -kidney disease -lung or breathing disease, like asthma -lupus -an unusual or allergic reaction to rituximab, mouse proteins, other medicines, foods, dyes, or  preservatives -pregnant or trying to get pregnant -breast-feeding How should I use this medicine? This medicine is for infusion into a vein. It is administered in a hospital or clinic by a specially trained health care professional. A special MedGuide will be given to you by the pharmacist with each prescription and refill. Be sure to read this information carefully each time. Talk to your pediatrician regarding the use of this medicine in children. This medicine is not approved for use in children. Overdosage: If you think you have taken too much of this medicine contact a poison control center or emergency room at once. NOTE: This medicine is only for you. Do not share this medicine with others. What if I miss a dose? It is important not to miss a dose. Call your doctor or health care professional if you are unable to keep an appointment. What may interact with this medicine? -cisplatin -medicines for blood pressure -some other medicines for arthritis -vaccines This list may not describe all possible interactions. Give your health care provider a list of all the medicines, herbs, non-prescription drugs, or dietary supplements you use. Also tell them if you smoke, drink alcohol, or   use illegal drugs. Some items may interact with your medicine. What should I watch for while using this medicine? Report any side effects that you notice during your treatment right away, such as changes in your breathing, fever, chills, dizziness or lightheadedness. These effects are more common with the first dose. Visit your prescriber or health care professional for checks on your progress. You will need to have regular blood work. Report any other side effects. The side effects of this medicine can continue after you finish your treatment. Continue your course of treatment even though you feel ill unless your doctor tells you to stop. Call your doctor or health care professional for advice if you get a fever,  chills or sore throat, or other symptoms of a cold or flu. Do not treat yourself. This drug decreases your body's ability to fight infections. Try to avoid being around people who are sick. This medicine may increase your risk to bruise or bleed. Call your doctor or health care professional if you notice any unusual bleeding. Be careful brushing and flossing your teeth or using a toothpick because you may get an infection or bleed more easily. If you have any dental work done, tell your dentist you are receiving this medicine. Avoid taking products that contain aspirin, acetaminophen, ibuprofen, naproxen, or ketoprofen unless instructed by your doctor. These medicines may hide a fever. Do not become pregnant while taking this medicine. Women should inform their doctor if they wish to become pregnant or think they might be pregnant. There is a potential for serious side effects to an unborn child. Talk to your health care professional or pharmacist for more information. Do not breast-feed an infant while taking this medicine. What side effects may I notice from receiving this medicine? Side effects that you should report to your doctor or health care professional as soon as possible: -allergic reactions like skin rash, itching or hives, swelling of the face, lips, or tongue -low blood counts - this medicine may decrease the number of white blood cells, red blood cells and platelets. You may be at increased risk for infections and bleeding. -signs of infection - fever or chills, cough, sore throat, pain or difficulty passing urine -signs of decreased platelets or bleeding - bruising, pinpoint red spots on the skin, black, tarry stools, blood in the urine -signs of decreased red blood cells - unusually weak or tired, fainting spells, lightheadedness -breathing problems -confused, not responsive -chest pain -fast, irregular heartbeat -feeling faint or lightheaded, falls -mouth sores -redness,  blistering, peeling or loosening of the skin, including inside the mouth -stomach pain -swelling of the ankles, feet, or hands -trouble passing urine or change in the amount of urine Side effects that usually do not require medical attention (report to your doctor or other health care professional if they continue or are bothersome): -anxiety -headache -loss of appetite -muscle aches -nausea -night sweats This list may not describe all possible side effects. Call your doctor for medical advice about side effects. You may report side effects to FDA at 1-800-FDA-1088. Where should I keep my medicine? This drug is given in a hospital or clinic and will not be stored at home. NOTE: This sheet is a summary. It may not cover all possible information. If you have questions about this medicine, talk to your doctor, pharmacist, or health care provider.  2013, [employer]vier/Gold Standard. (10/05/2007 2:04:59 PM)  

## 2012-12-16 ENCOUNTER — Other Ambulatory Visit: Payer: Medicare Other | Admitting: Lab

## 2012-12-16 ENCOUNTER — Ambulatory Visit: Payer: Medicare Other

## 2012-12-16 ENCOUNTER — Ambulatory Visit (HOSPITAL_BASED_OUTPATIENT_CLINIC_OR_DEPARTMENT_OTHER): Payer: Medicare Other

## 2012-12-16 ENCOUNTER — Ambulatory Visit: Payer: Medicare Other | Admitting: Hematology & Oncology

## 2012-12-16 ENCOUNTER — Other Ambulatory Visit: Payer: Self-pay | Admitting: *Deleted

## 2012-12-16 DIAGNOSIS — C799 Secondary malignant neoplasm of unspecified site: Secondary | ICD-10-CM

## 2012-12-16 DIAGNOSIS — Z5189 Encounter for other specified aftercare: Secondary | ICD-10-CM

## 2012-12-16 DIAGNOSIS — C8589 Other specified types of non-Hodgkin lymphoma, extranodal and solid organ sites: Secondary | ICD-10-CM

## 2012-12-16 MED ORDER — PEGFILGRASTIM INJECTION 6 MG/0.6ML: 6.0000 mg | Freq: Once | SUBCUTANEOUS | Status: DC

## 2012-12-16 MED ORDER — PEGFILGRASTIM INJECTION 6 MG/0.6ML
SUBCUTANEOUS | Status: AC
  Filled 2012-12-16: qty 0.6

## 2012-12-16 MED ORDER — PEGFILGRASTIM INJECTION 6 MG/0.6ML
6.0000 mg | Freq: Once | SUBCUTANEOUS | Status: AC
  Administered 2012-12-16: 6 mg via SUBCUTANEOUS

## 2012-12-16 NOTE — Progress Notes (Signed)
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  CURRENT THERAPY: 1. The patient is status post 5 cycles of R-CHOP. 2. Neulasta for chemotherapy-induced neutropenia.  INTERIM HISTORY:  Keith Duffy comes in for an early visit.  He has been having a lot of problems with his left hip.  This started about a month ago.  This has gotten progressively worse.  He had a CT scan done of his left hip.  This was done a couple of couple days ago.  All that was noted were degenerative findings with spurring, some cortical cyst formation, and loss of articular space.  There was no obvious fracture noted.  There was no obvious lymphomatous involvement.  He has been taking Dilaudid for this.  This has not worked.  He has been taking Flexeril for this.  This also really has not worked that well.  I think he is probably going to need an MRI.  Personally, I think that this is not related to lymphoma.  It may be avascular necrosis.  He has longstanding diabetes.  This may be a factor.  He has had no other issues.  His appetite has been doing okay.  He has had no cough.  He has had no dysphagia or odynophagia.  There have been no problems with bowels or bladder.  His blood sugars have been doing fairly well.  PHYSICAL EXAMINATION:  General:  This is a fairly well-developed, well- nourished white gentleman, in no obvious distress.  Vital signs:  Show a temperature of 97.7, pulse 80, respiratory rate 18, blood pressure 106/88.  Head and neck:  Shows no ocular or oral lesions.  There are no palpable cervical or supraclavicular lymph nodes.  Lungs:  Clear bilaterally.  Cardiac:  Regular rate and rhythm, with a normal S1 and S2.  There are no murmurs, rubs, or bruits.  Abdomen:  Soft.  He has good bowel sounds.  There is no fluid wave.  There is no palpable abdominal mass.  There is no palpable hepatosplenomegaly.  Back:  No tenderness over the spine, ribs,  or hips.  He does have some pain with palpation in the posterior left hip.  No swelling is noted in this area. He has pain with extension of his left leg.  He has pretty good flexion of the left hip.  There is some slight discomfort with abduction and adduction of the left hip.  The right hip is unremarkable.  Extremities: Show good strength in his legs.  He has good range motion of his joints. No venous cords are noted.  Skin:  No rashes, ecchymoses, or petechiae. Neurological:  No focal neurological deficits.  LABORATORY STUDIES:  White cell count is 5.3, hemoglobin 13.9, hematocrit 42.4, and platelet count is 186,000.  IMPRESSION:  Keith Duffy is a 67 year old gentleman with large-cell non- Hodgkin's lymphoma.  He has a very high IPI score.  His response to chemotherapy has been quite nice today.  Again, I am not sure what is causing all of this left hip pain.  We will get an MRI.  I will have this done today.  We will see what the MRI shows.  Again, one would have to think that this is degenerative in nature.  It may possibly be avascular necrosis.  I will give him some IV Toradol in the office.  I will see if this helps a little bit.  Again, the results of the MRI will really dictate  how we proceed.  He may need an orthopedic referral depending on what is found.  We will go ahead with his 6th treatment today.  I will try to get him back in a few weeks to see [place] and then we will make some further decisions regarding therapy.    ______________________________ [name], M.D. PRE/MEDQ  D:  12/15/2012  T:  12/16/2012  Job:  [id number]

## 2012-12-16 NOTE — Addendum Note (Signed)
Addended by: [name] R on: 12/16/2012 10:17 AM   Modules accepted: Orders

## 2012-12-17 ENCOUNTER — Ambulatory Visit: Payer: Medicare Other

## 2012-12-17 ENCOUNTER — Other Ambulatory Visit: Payer: Self-pay | Admitting: Radiology

## 2012-12-17 ENCOUNTER — Other Ambulatory Visit: Payer: Self-pay | Admitting: *Deleted

## 2012-12-17 DIAGNOSIS — C8590 Non-Hodgkin lymphoma, unspecified, unspecified site: Secondary | ICD-10-CM

## 2012-12-17 DIAGNOSIS — C799 Secondary malignant neoplasm of unspecified site: Secondary | ICD-10-CM

## 2012-12-17 MED ORDER — OXYCODONE HCL 20 MG/ML PO CONC: 20.0000 mg | ORAL | Status: DC | PRN

## 2012-12-17 MED ORDER — FENTANYL 12 MCG/HR TD PT72: 1.0000 | MEDICATED_PATCH | TRANSDERMAL | Status: DC

## 2012-12-17 NOTE — Telephone Encounter (Signed)
Pt's wife called earlier in the day and spoke to [facility] regarding is ongoing pain. He took Flexeril with Mobic then 2 Dilaudid tabs followed by another an hour or so later with minimal relief. He finally got some relief with the addition of hydrocodone 5/500 he had at home. He has 12 hydrocodone tabs left and 2 Dialudid. Reviewed with Dr [name]. To confirm dose of Mobic at 15 mg; start Fentanyl patch 12 mcg an Oxyfast Elixir 1-2 cc q 4 h prn. Inquired about his codeine allergy. He said he it was a long time ago when he was a child and his parents told him he was bouncing off the walls. He is willing to try the Oxyfast as he hasn't had any trouble with the hydrocodone. It was confirmed he is taking 15 mg of Mobic. Explained the mechanism of action of the patch and that it may take a few days to get the full effect thus the reason for the Oxyfast. Also knows he make take it every 4 hours as needed. Will reinforce teaching in the am if necessary. Routed rx to Dr [name] for approval and signature then will place at the front desk when ready for pick up.

## 2012-12-18 ENCOUNTER — Other Ambulatory Visit: Payer: Self-pay | Admitting: *Deleted

## 2012-12-18 ENCOUNTER — Telehealth: Payer: Self-pay | Admitting: Hematology & Oncology

## 2012-12-18 ENCOUNTER — Encounter (HOSPITAL_COMMUNITY): Payer: Self-pay | Admitting: Pharmacy Technician

## 2012-12-18 MED ORDER — OXYCODONE HCL 10 MG PO TABS: 10.0000 mg | ORAL_TABLET | ORAL | Status: DC | PRN

## 2012-12-18 NOTE — Telephone Encounter (Signed)
Oxy-fast was not available downstairs at the [facility] pharmacy and the pt's wife is not familiar with [facility]. She wanted to have this switched to something else if possible. Reviewed with Dr [name]. Ok to switch to Oxycodone 10 mg - 1 to 2 tabs every 4 hours as needed for pain. Will route to him for approval and signature then take it up front for her to pick up.

## 2012-12-18 NOTE — Telephone Encounter (Signed)
[facility] called for medical records. Faxed today to: Barbara @ [phone]   P: [phone]

## 2012-12-21 ENCOUNTER — Ambulatory Visit (HOSPITAL_COMMUNITY)
Admission: RE | Admit: 2012-12-21 | Discharge: 2012-12-21 | Disposition: A | Discharge status: Home / Self Care | Payer: Medicare Other | Source: Ambulatory Visit | Attending: Hematology & Oncology | Admitting: Hematology & Oncology

## 2012-12-21 ENCOUNTER — Encounter (HOSPITAL_COMMUNITY): Payer: Self-pay

## 2012-12-21 DIAGNOSIS — E119 Type 2 diabetes mellitus without complications: Secondary | ICD-10-CM | POA: Insufficient documentation

## 2012-12-21 DIAGNOSIS — D61818 Other pancytopenia: Secondary | ICD-10-CM | POA: Insufficient documentation

## 2012-12-21 DIAGNOSIS — C8589 Other specified types of non-Hodgkin lymphoma, extranodal and solid organ sites: Secondary | ICD-10-CM | POA: Insufficient documentation

## 2012-12-21 DIAGNOSIS — C8590 Non-Hodgkin lymphoma, unspecified, unspecified site: Secondary | ICD-10-CM

## 2012-12-21 DIAGNOSIS — I10 Essential (primary) hypertension: Secondary | ICD-10-CM | POA: Insufficient documentation

## 2012-12-21 DIAGNOSIS — D7581 Myelofibrosis: Secondary | ICD-10-CM | POA: Insufficient documentation

## 2012-12-21 LAB — CBC
MCHC: 33.7 g/dL (ref 30.0–36.0)
Platelets: 108 10*3/uL — ABNORMAL LOW (ref 150–400)
RDW: 15.4 % (ref 11.5–15.5)
WBC: 3.4 10*3/uL — ABNORMAL LOW (ref 4.0–10.5)

## 2012-12-21 LAB — APTT: aPTT: 44 seconds — ABNORMAL HIGH (ref 24–37)

## 2012-12-21 LAB — GLUCOSE, CAPILLARY: Glucose-Capillary: 181 mg/dL — ABNORMAL HIGH (ref 70–99)

## 2012-12-21 LAB — PROTIME-INR
INR: 1.03 (ref 0.00–1.49)
Prothrombin Time: 13.3 seconds (ref 11.6–15.2)

## 2012-12-21 MED ORDER — SODIUM CHLORIDE 0.9 % IV SOLN
INTRAVENOUS | Status: DC
  Administered 2012-12-21: 08:00:00 via INTRAVENOUS

## 2012-12-21 MED ORDER — HEPARIN SOD (PORK) LOCK FLUSH 100 UNIT/ML IV SOLN
500.0000 [IU] | INTRAVENOUS | Status: AC | PRN
  Administered 2012-12-21: 500 [IU]
  Filled 2012-12-21: qty 5

## 2012-12-21 MED ORDER — FENTANYL CITRATE 0.05 MG/ML IJ SOLN
INTRAMUSCULAR | Status: AC
Start: 2012-12-21 — End: 2012-12-21
  Filled 2012-12-21: qty 6

## 2012-12-21 MED ORDER — MIDAZOLAM HCL 2 MG/2ML IJ SOLN
INTRAMUSCULAR | Status: AC
  Filled 2012-12-21: qty 6

## 2012-12-21 MED ORDER — MIDAZOLAM HCL 2 MG/2ML IJ SOLN
INTRAMUSCULAR | Status: AC | PRN
  Administered 2012-12-21 (×3): 1 mg via INTRAVENOUS

## 2012-12-21 MED ORDER — FENTANYL CITRATE 0.05 MG/ML IJ SOLN
INTRAMUSCULAR | Status: AC | PRN
  Administered 2012-12-21: 100 ug via INTRAVENOUS
  Administered 2012-12-21: 50 ug via INTRAVENOUS

## 2012-12-21 NOTE — H&P (Signed)
Agree.  For bone marrow biopsy today. 

## 2012-12-21 NOTE — Procedures (Signed)
Procedure:  CT guided bone marrow biopsy Findings:  Right iliac bone marrow aspirate and core biopsy performed under CT guidance

## 2012-12-21 NOTE — H&P (Signed)
Keith Duffy is an 67 y.o. male.   Chief Complaint: "I'm having a bone marrow biopsy" HPI: Patient with history of NHL presents today for CT guided bone marrow biopsy.  Past Medical History  Diagnosis Date  . Back pain   . Hypertension   . Diabetes mellitus without complication   . Cancer     non hodkins lymphoma    History reviewed. No pertinent past surgical history.  Family History  Problem Relation Age of Onset  . Heart attack Father   . Heart attack Maternal Grandfather    Social History:  reports that he has never smoked. He does not have any smokeless tobacco history on file. He reports that he does not drink alcohol or use illicit drugs.  Allergies:  Allergies  Allergen Reactions  . Aspartame And Phenylalanine Diarrhea  . Codeine Other (See Comments)    "Made me go wild when I was young"    Current outpatient prescriptions:acetaminophen (TYLENOL) 500 MG tablet, Take 1,000 mg by mouth every 6 (six) hours as needed for pain., Disp: , Rfl: ;  cyclobenzaprine (FLEXERIL) 10 MG tablet, Take 10 mg by mouth 3 (three) times daily as needed for muscle spasms., Disp: , Rfl: ;  famciclovir (FAMVIR) 500 MG tablet, Take 250 mg by mouth every morning., Disp: , Rfl:  fentaNYL (DURAGESIC - DOSED MCG/HR) 12 MCG/HR, Place 1 patch (12.5 mcg total) onto the skin every 3 (three) days., Disp: 10 patch, Rfl: 0 insulin aspart (NOVOLOG FLEXPEN) 100 UNIT/ML SOPN FlexPen, Inject 0-9 Units into the skin 3 (three) times daily with meals. CBG < 70: eat or drink something sweet & recheck blood sugar. CBG 70 - 120: 0 units  CBG 121 - 150: 1 unit  CBG 151 - 200: 2 units  CBG 201 - 250: 3 units  CBG 251 - 300: 5 units  CBG 301 - 350: 7 units  CBG 351 - 400: 9 units  CBG > 400: call MD, Disp: 5 pen, Rfl: 0 insulin glargine (LANTUS) 100 units/mL SOLN, Inject 25 Units into the skin at bedtime., Disp: , Rfl: ;  lidocaine-prilocaine (EMLA) cream, Apply 1 application topically daily as needed (For port-a-cath.).,  Disp: , Rfl: ;  meloxicam (MOBIC) 15 MG tablet, Take 15 mg by mouth every morning., Disp: , Rfl: ;  metoprolol tartrate (LOPRESSOR) 25 MG tablet, Take 1 tablet (25 mg total) by mouth 2 (two) times daily., Disp: 60 tablet, Rfl: 0 ondansetron (ZOFRAN) 8 MG tablet, Take 1 tablet (8 mg total) by mouth 2 (two) times daily. Take this beginning 1 day after chemotherapy.  Take this for 3 days after each chemotherapy., Disp: 20 tablet, Rfl: 3;  Oxycodone HCl 10 MG TABS, Take 1-2 tablets (10-20 mg total) by mouth every 4 (four) hours as needed. breakthrough pain, Disp: 180 tablet, Rfl: 0 PRESCRIPTION MEDICATION, Patient was receiving chemo treatments at the [facility]. His oncologist was Dr. [name]. His received his last dose of Cytoxan 1440mg , Oncovin 2mg , Doxorubicin 96mg  and Rituxan 700mg  on 12/15/12 and a dose of Neulasta 6mg  on 12/16/12. His wife stated that they are waiting to see what they are going to do next because he is still having a lot of back pain., Disp: , Rfl:  prochlorperazine (COMPAZINE) 10 MG tablet, Take 10 mg by mouth every 6 (six) hours as needed (nausea)., Disp: , Rfl: ;  sulfamethoxazole-trimethoprim (BACTRIM DS,SEPTRA DS) 800-160 MG per tablet, Take 1 tablet by mouth every Monday, Wednesday, and Friday., Disp: ,  Rfl:  Current facility-administered medications:0.9 %  sodium chloride infusion, , Intravenous, Continuous, D Kevin Lexington Krotz, PA-C, Last Rate: 20 mL/hr at 12/21/12 0740;  fentaNYL (SUBLIMAZE) 0.05 MG/ML injection, , , , ;  midazolam (VERSED) 2 MG/2ML injection, , , ,    Results for orders placed during the hospital encounter of 12/21/12 (from the past 48 hour(s))  APTT     Status: Abnormal   Collection Time    12/21/12  7:40 AM      Result Value Range   aPTT 44 (*) 24 - 37 seconds   Comment:            IF BASELINE aPTT IS ELEVATED,     SUGGEST PATIENT RISK ASSESSMENT     BE USED TO DETERMINE APPROPRIATE     ANTICOAGULANT THERAPY.  CBC     Status: Abnormal    Collection Time    12/21/12  7:40 AM      Result Value Range   WBC 3.4 (*) 4.0 - 10.5 K/uL   RBC 3.63 (*) 4.22 - 5.81 MIL/uL   Hemoglobin 11.1 (*) 13.0 - 17.0 g/dL   HCT 63.8 (*) 75.6 - 43.3 %   MCV 90.6  78.0 - 100.0 fL   MCH 30.6  26.0 - 34.0 pg   MCHC 33.7  30.0 - 36.0 g/dL   RDW 29.5  18.8 - 41.6 %   Platelets 108 (*) 150 - 400 K/uL   Comment: SPECIMEN CHECKED FOR CLOTS     PLATELET COUNT CONFIRMED BY SMEAR  PROTIME-INR     Status: None   Collection Time    12/21/12  7:40 AM      Result Value Range   Prothrombin Time 13.3  11.6 - 15.2 seconds   INR 1.03  0.00 - 1.49   No results found.  Review of Systems  Constitutional: Positive for malaise/fatigue.       Recent fever  Respiratory: Negative for cough and shortness of breath.   Cardiovascular: Negative for chest pain.  Gastrointestinal: Negative for vomiting and abdominal pain.       Occ nausea  Musculoskeletal: Negative for back pain.       Left hip pain  Neurological: Negative for headaches.  Endo/Heme/Allergies: Does not bruise/bleed easily.    Blood pressure 126/72, pulse 105, temperature 98.2 F (36.8 C), temperature source Oral, resp. rate 18, SpO2 100.00%. Physical Exam  Constitutional: He is oriented to person, place, and time. He appears well-developed and well-nourished.  Cardiovascular:  Tachy but regular rhythm  Respiratory: Effort normal and breath sounds normal.  Intact rt chest wall PAC  GI: Soft. Bowel sounds are normal.  Musculoskeletal: Normal range of motion. He exhibits no edema.  Neurological: He is alert and oriented to person, place, and time.     Assessment/Plan Pt with hx of NHL . Plan is for CT guided bone marrow biopsy today. Details/risks of procedure d/w pt/wife with their understanding and consent.  Keith Duffy,D KEVIN 12/21/2012, 8:51 AM

## 2012-12-23 ENCOUNTER — Telehealth: Payer: Self-pay | Admitting: Hematology & Oncology

## 2012-12-23 NOTE — Telephone Encounter (Signed)
Received fax from [place] at [facility] [phone] with 12-22-12 appointment. Copy to RN and sent to scan center

## 2012-12-24 ENCOUNTER — Other Ambulatory Visit: Payer: Self-pay

## 2013-01-01 ENCOUNTER — Telehealth: Payer: Self-pay | Admitting: Hematology & Oncology

## 2013-01-01 ENCOUNTER — Telehealth: Payer: Self-pay | Admitting: *Deleted

## 2013-01-01 NOTE — Telephone Encounter (Signed)
Pt aware of 12-1 appointment cx 11-19

## 2013-01-01 NOTE — Telephone Encounter (Signed)
Received a call from Patient wife confused about schedule with radiation and chemotherapy.  Dr. Clovis Riley in [place] does not want patient to be receiving chemo and radiation at same time.  Called [facility] and talked with Dr. Clovis Riley.  Dr. Clovis Riley worried about radiation recall.  Dr. [name] would like for patient to receive all of RT dose to help with pain.  After consulting with both doctors it was determined patient would complete radiation and receive chemo therapy dose after Thanksgiving.

## 2013-01-06 ENCOUNTER — Ambulatory Visit: Payer: Medicare Other | Admitting: Hematology & Oncology

## 2013-01-06 ENCOUNTER — Ambulatory Visit: Payer: Medicare Other

## 2013-01-06 ENCOUNTER — Other Ambulatory Visit: Payer: Medicare Other | Admitting: Lab

## 2013-01-07 ENCOUNTER — Ambulatory Visit: Payer: Medicare Other

## 2013-01-18 ENCOUNTER — Ambulatory Visit (HOSPITAL_BASED_OUTPATIENT_CLINIC_OR_DEPARTMENT_OTHER): Payer: Medicare Other

## 2013-01-18 ENCOUNTER — Ambulatory Visit (HOSPITAL_BASED_OUTPATIENT_CLINIC_OR_DEPARTMENT_OTHER): Payer: Medicare Other | Admitting: Hematology & Oncology

## 2013-01-18 ENCOUNTER — Other Ambulatory Visit (HOSPITAL_BASED_OUTPATIENT_CLINIC_OR_DEPARTMENT_OTHER): Payer: Medicare Other | Admitting: Lab

## 2013-01-18 VITALS — BP 130/68 | HR 87 | Temp 98.0°F | Resp 18 | Ht 69.0 in | Wt 173.0 lb

## 2013-01-18 VITALS — BP 157/87 | HR 79 | Temp 96.2°F | Resp 18

## 2013-01-18 DIAGNOSIS — M25552 Pain in left hip: Secondary | ICD-10-CM

## 2013-01-18 DIAGNOSIS — C799 Secondary malignant neoplasm of unspecified site: Secondary | ICD-10-CM

## 2013-01-18 DIAGNOSIS — E1165 Type 2 diabetes mellitus with hyperglycemia: Secondary | ICD-10-CM

## 2013-01-18 DIAGNOSIS — C8589 Other specified types of non-Hodgkin lymphoma, extranodal and solid organ sites: Secondary | ICD-10-CM

## 2013-01-18 DIAGNOSIS — C8590 Non-Hodgkin lymphoma, unspecified, unspecified site: Secondary | ICD-10-CM

## 2013-01-18 DIAGNOSIS — R5383 Other fatigue: Secondary | ICD-10-CM

## 2013-01-18 DIAGNOSIS — M25559 Pain in unspecified hip: Secondary | ICD-10-CM

## 2013-01-18 DIAGNOSIS — IMO0002 Reserved for concepts with insufficient information to code with codable children: Secondary | ICD-10-CM

## 2013-01-18 DIAGNOSIS — Z5111 Encounter for antineoplastic chemotherapy: Secondary | ICD-10-CM

## 2013-01-18 DIAGNOSIS — Z5112 Encounter for antineoplastic immunotherapy: Secondary | ICD-10-CM

## 2013-01-18 DIAGNOSIS — IMO0001 Reserved for inherently not codable concepts without codable children: Secondary | ICD-10-CM

## 2013-01-18 DIAGNOSIS — E349 Endocrine disorder, unspecified: Secondary | ICD-10-CM

## 2013-01-18 LAB — CBC WITH DIFFERENTIAL (CANCER CENTER ONLY)
BASO%: 0.4 % (ref 0.0–2.0)
EOS%: 1.6 % (ref 0.0–7.0)
LYMPH%: 5.7 % — ABNORMAL LOW (ref 14.0–48.0)
MCH: 32.2 pg (ref 28.0–33.4)
MCHC: 32.9 g/dL (ref 32.0–35.9)
MCV: 98 fL (ref 82–98)
MONO%: 7.3 % (ref 0.0–13.0)
Platelets: 152 10*3/uL (ref 145–400)
RDW: 15.2 % (ref 11.1–15.7)

## 2013-01-18 LAB — CMP (CANCER CENTER ONLY)
ALT(SGPT): 17 U/L (ref 10–47)
AST: 22 U/L (ref 11–38)
Albumin: 3.6 g/dL (ref 3.3–5.5)
CO2: 28 mEq/L (ref 18–33)
Creat: 0.8 mg/dl (ref 0.6–1.2)
Potassium: 4.1 mEq/L (ref 3.3–4.7)
Total Bilirubin: 0.7 mg/dl (ref 0.20–1.60)

## 2013-01-18 LAB — LACTATE DEHYDROGENASE: LDH: 144 U/L (ref 94–250)

## 2013-01-18 MED ORDER — SODIUM CHLORIDE 0.9 % IV SOLN
375.0000 mg/m2 | Freq: Once | INTRAVENOUS | Status: AC
  Administered 2013-01-18: 700 mg via INTRAVENOUS
  Filled 2013-01-18: qty 70

## 2013-01-18 MED ORDER — FENTANYL 12 MCG/HR TD PT72: 12.5000 ug | MEDICATED_PATCH | TRANSDERMAL | Status: DC

## 2013-01-18 MED ORDER — DIPHENHYDRAMINE HCL 25 MG PO CAPS
ORAL_CAPSULE | ORAL | Status: AC
  Filled 2013-01-18: qty 2

## 2013-01-18 MED ORDER — ACETAMINOPHEN 325 MG PO TABS
ORAL_TABLET | ORAL | Status: AC
  Filled 2013-01-18: qty 2

## 2013-01-18 MED ORDER — ONDANSETRON 16 MG/50ML IVPB (CHCC)
INTRAVENOUS | Status: AC
  Filled 2013-01-18: qty 16

## 2013-01-18 MED ORDER — HEPARIN SOD (PORK) LOCK FLUSH 100 UNIT/ML IV SOLN
250.0000 [IU] | Freq: Once | INTRAVENOUS | Status: DC | PRN
  Filled 2013-01-18: qty 5

## 2013-01-18 MED ORDER — ACETAMINOPHEN 325 MG PO TABS
650.0000 mg | ORAL_TABLET | Freq: Once | ORAL | Status: AC
  Administered 2013-01-18: 650 mg via ORAL

## 2013-01-18 MED ORDER — SODIUM CHLORIDE 0.9 % IV SOLN
Freq: Once | INTRAVENOUS | Status: AC
  Administered 2013-01-18: 10:00:00 via INTRAVENOUS

## 2013-01-18 MED ORDER — DOXORUBICIN HCL CHEMO IV INJECTION 2 MG/ML
50.0000 mg/m2 | Freq: Once | INTRAVENOUS | Status: AC
  Administered 2013-01-18: 96 mg via INTRAVENOUS
  Filled 2013-01-18: qty 48

## 2013-01-18 MED ORDER — DEXAMETHASONE SODIUM PHOSPHATE 20 MG/5ML IJ SOLN
20.0000 mg | Freq: Once | INTRAMUSCULAR | Status: AC
  Administered 2013-01-18: 20 mg via INTRAVENOUS

## 2013-01-18 MED ORDER — SODIUM CHLORIDE 0.9 % IJ SOLN
3.0000 mL | INTRAMUSCULAR | Status: DC | PRN
  Filled 2013-01-18: qty 10

## 2013-01-18 MED ORDER — SODIUM CHLORIDE 0.9 % IV SOLN
750.0000 mg/m2 | Freq: Once | INTRAVENOUS | Status: AC
  Administered 2013-01-18: 1440 mg via INTRAVENOUS
  Filled 2013-01-18: qty 72

## 2013-01-18 MED ORDER — DIPHENHYDRAMINE HCL 25 MG PO CAPS
50.0000 mg | ORAL_CAPSULE | Freq: Once | ORAL | Status: AC
  Administered 2013-01-18: 50 mg via ORAL

## 2013-01-18 MED ORDER — HEPARIN SOD (PORK) LOCK FLUSH 100 UNIT/ML IV SOLN
500.0000 [IU] | Freq: Once | INTRAVENOUS | Status: AC | PRN
  Administered 2013-01-18: 500 [IU]
  Filled 2013-01-18: qty 5

## 2013-01-18 MED ORDER — SODIUM CHLORIDE 0.9 % IJ SOLN
10.0000 mL | INTRAMUSCULAR | Status: DC | PRN
  Administered 2013-01-18: 10 mL
  Filled 2013-01-18: qty 10

## 2013-01-18 MED ORDER — ONDANSETRON 16 MG/50ML IVPB (CHCC)
16.0000 mg | Freq: Once | INTRAVENOUS | Status: AC
  Administered 2013-01-18: 16 mg via INTRAVENOUS

## 2013-01-18 MED ORDER — SODIUM CHLORIDE 0.9 % IV SOLN
2.0000 mg | Freq: Once | INTRAVENOUS | Status: AC
  Administered 2013-01-18: 2 mg via INTRAVENOUS
  Filled 2013-01-18: qty 2

## 2013-01-18 MED ORDER — ALTEPLASE 2 MG IJ SOLR
2.0000 mg | Freq: Once | INTRAMUSCULAR | Status: DC | PRN
  Filled 2013-01-18: qty 2

## 2013-01-18 MED ORDER — LEVOFLOXACIN 500 MG PO TABS: 500.0000 mg | ORAL_TABLET | Freq: Every day | ORAL | Status: DC

## 2013-01-18 MED ORDER — DEXAMETHASONE SODIUM PHOSPHATE 20 MG/5ML IJ SOLN
INTRAMUSCULAR | Status: AC
  Filled 2013-01-18: qty 5

## 2013-01-18 NOTE — Progress Notes (Signed)
This office note has been dictated.

## 2013-01-18 NOTE — Patient Instructions (Signed)
[facility] Discharge Instructions for Patients Receiving Chemotherapy  Today you received the following chemotherapy agents Rituxan, Adriamycin, Vincristine, Cytoxan.   To help prevent nausea and vomiting after your treatment, we encourage you to take your nausea medication as prescribed.    If you develop nausea and vomiting that is not controlled by your nausea medication, call the clinic.   BELOW ARE SYMPTOMS THAT SHOULD BE REPORTED IMMEDIATELY:  *FEVER GREATER THAN 100.5 F  *CHILLS WITH OR WITHOUT FEVER  NAUSEA AND VOMITING THAT IS NOT CONTROLLED WITH YOUR NAUSEA MEDICATION  *UNUSUAL SHORTNESS OF BREATH  *UNUSUAL BRUISING OR BLEEDING  TENDERNESS IN MOUTH AND THROAT WITH OR WITHOUT PRESENCE OF ULCERS  *URINARY PROBLEMS  *BOWEL PROBLEMS  UNUSUAL RASH Items with * indicate a potential emergency and should be followed up as soon as possible.  Feel free to call the clinic you have any questions or concerns. The clinic phone number is [phone].

## 2013-01-19 ENCOUNTER — Ambulatory Visit: Payer: Medicare Other

## 2013-01-22 MED ORDER — ANDROGEL 40.5 MG/2.5GM (1.62%) TD GEL: 40.5000 mg | Freq: Every day | TRANSDERMAL | Status: DC

## 2013-01-22 NOTE — Addendum Note (Signed)
Addended by: [name] R on: 01/22/2013 12:59 PM   Modules accepted: Orders

## 2013-01-23 NOTE — Progress Notes (Signed)
DIAGNOSIS:  Diffuse large cell non-Hodgkin lymphoma (IPI score of 5).  CURRENT THERAPY: 1. The patient is status post 6 cycles of R-CHOP. 2. Patient is status post palliative radiation therapy to the left     hip. 3. Neulasta for chemotherapy-induced neutropenia.  INTERIM HISTORY:  Keith Duffy comes in for his followup.  His left hip is doing a little bit better.  He is on a fentanyl patch.  He completed radiation therapy to the hip.  When we last saw him, we did sort of repeat some staging on him.  He had a bone marrow biopsy done.  This was done on December 21, 2012.  The bone marrow report ([id number]) showed necrosis and dense fibrosis.  There is no obvious active lymphoma.  Of course, there was "limited bone marrow material."  I still am not convinced that he still has active disease.  He had an MRI of the left hip.  Of course, the radiologist says there were changes that reflected involvement of "lymphoma or metastatic disease."  There may be superimposed bone infarctions.  When correlated with the recent PET scan, it was felt that the osseous and metabolic activity were significant for lymphoma.  He has been on Neulasta.  I am not sure if the Neulasta may have also caused some of these changes seen on the MRI.  I am going to try to hold his Neulasta with this cycle of chemotherapy if possible.  His appetite has been okay.  His diabetes has not been out of control. His wife is very diligent with making sure his blood sugars are okay.  He has had no cough.  He has had some constipation.  Overall, his performance status is ECOG 1.  PHYSICAL EXAMINATION:  General:  This is a fairly well-developed, well- nourished white gentleman, in no obvious distress.  Vital signs: Temperature of 98, pulse 87, respiratory rate 18, blood pressure 130/68. Weight is 173 pounds.  Head and Neck:  Normocephalic, atraumatic skull. There are no ocular or oral lesions.  He has no palpable  cervical or supraclavicular lymph nodes.  Lungs:  Clear bilaterally.  Cardiac: Regular rate and rhythm with normal S1, S2.  There are no murmurs, rubs, or bruits.  Abdomen:  Soft.  He has good bowel sounds.  There is no fluid wave.  There is no palpable abdominal mass.  No palpable hepatosplenomegaly.  Back:  No tenderness over the spine or ribs.  The right hip is unremarkable.  Left hip showed some slight tenderness to palpation in the lateral portion.  Extremities:  No atrophy.  He has decent range of motion of his Joints.  There may be some limited range of motion of the left hip.  Skin:  No rashes.  Neurological:  No focal neurological deficits.  LABORATORY STUDIES:  White cell count is 4.9, hemoglobin 11.4, hematocrit 34.6, platelet count 152.  Sodium is 140, potassium 4.1, BUN 18, creatinine 0.8.  Calcium 9.1, with an albumin of 3.6.  IMPRESSION:  Keith Duffy is a nice 67 year old gentleman.  He presented with very advanced non-Hodgkin lymphoma.  Even when we first saw him, his bone marrow was pretty much "necrotic."  I still think we have to "push ahead" with our treatment.  I will try to hold the Neulasta with this cycle.  We will see if this makes a difference for him.  I just want to try to be as aggressive as we can with dealing with his symptoms.  I will  plan to get him back in 3 weeks for his 8th and final cycle of treatment.  After this final cycle of treatment, I will set him up with a bone marrow test, which I will do myself.  We will also get other staging studies on him.    ______________________________ Keith Duffy, M.D. PRE/MEDQ  D:  01/18/2013  T:  01/23/2013  Job:  [id number]

## 2013-02-05 ENCOUNTER — Encounter: Payer: Self-pay | Admitting: Hematology & Oncology

## 2013-02-08 ENCOUNTER — Ambulatory Visit: Payer: Medicare Other

## 2013-02-08 ENCOUNTER — Ambulatory Visit (HOSPITAL_BASED_OUTPATIENT_CLINIC_OR_DEPARTMENT_OTHER): Payer: Medicare Other | Admitting: Hematology & Oncology

## 2013-02-08 ENCOUNTER — Other Ambulatory Visit (HOSPITAL_BASED_OUTPATIENT_CLINIC_OR_DEPARTMENT_OTHER): Payer: Medicare Other | Admitting: Lab

## 2013-02-08 VITALS — BP 111/59 | HR 89 | Temp 98.2°F | Resp 18 | Ht 69.0 in | Wt 168.0 lb

## 2013-02-08 DIAGNOSIS — C8590 Non-Hodgkin lymphoma, unspecified, unspecified site: Secondary | ICD-10-CM

## 2013-02-08 DIAGNOSIS — C8589 Other specified types of non-Hodgkin lymphoma, extranodal and solid organ sites: Secondary | ICD-10-CM

## 2013-02-08 LAB — CBC WITH DIFFERENTIAL (CANCER CENTER ONLY)
BASO#: 0 10*3/uL (ref 0.0–0.2)
BASO%: 2 % (ref 0.0–2.0)
Eosinophils Absolute: 0 10*3/uL (ref 0.0–0.5)
HCT: 32.5 % — ABNORMAL LOW (ref 38.7–49.9)
HGB: 10.7 g/dL — ABNORMAL LOW (ref 13.0–17.1)
LYMPH%: 31.3 % (ref 14.0–48.0)
MCHC: 32.9 g/dL (ref 32.0–35.9)
MCV: 95 fL (ref 82–98)
MONO#: 0.6 10*3/uL (ref 0.1–0.9)
NEUT%: 5.1 % — ABNORMAL LOW (ref 40.0–80.0)
Platelets: 201 10*3/uL (ref 145–400)
RDW: 13.9 % (ref 11.1–15.7)
WBC: 1 10*3/uL — ABNORMAL LOW (ref 4.0–10.0)

## 2013-02-08 LAB — CMP (CANCER CENTER ONLY)
AST: 21 U/L (ref 11–38)
Albumin: 3.5 g/dL (ref 3.3–5.5)
BUN, Bld: 16 mg/dL (ref 7–22)
CO2: 29 mEq/L (ref 18–33)
Calcium: 9.1 mg/dL (ref 8.0–10.3)
Chloride: 102 mEq/L (ref 98–108)
Creat: 1.2 mg/dl (ref 0.6–1.2)
Glucose, Bld: 173 mg/dL — ABNORMAL HIGH (ref 73–118)
Total Bilirubin: 0.6 mg/dl (ref 0.20–1.60)

## 2013-02-08 NOTE — Progress Notes (Signed)
This office note has been dictated.

## 2013-02-09 ENCOUNTER — Ambulatory Visit: Payer: Medicare Other

## 2013-02-09 NOTE — Progress Notes (Signed)
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  CURRENT THERAPY: 1. The patient is status post 7 cycles of R-CHOP. 2. Palliative radiation therapy to the left hip. 3. Neulasta for chemotherapy-induced neutropenia.  INTERIM HISTORY:  Keith Duffy comes in for followup.  The radiation seem to help his left hip.  It does not seem to be hurting as much over there.  It is now hurting in the lower left leg.  He did have a Doppler test done down in [place].  This was ordered by Dr. Clovis Riley of Radiation Oncology.  Thankfully, the Doppler was negative.  It was still not clear at all as to whether or not Keith Duffy has active lymphoma or if he just has osteopenia issues from therapy and past lymphoma effects on his bone marrow.  He has had a recent bout of thrush.  He is on ketoconazole.  He does use Biotene.  His blood sugars actually have not been too high.  This is unusual for him.  He is eating fairly well.  He has had no abdominal pain.  He has had no cough or shortness of breath.  He has had no problems with bowels or bladder.  He has had no headache.  Currently, his performance status is ECOG 1.  This is probably the best that I have actually seen him look in couple of months.  PHYSICAL EXAMINATION:  General:  This is a fairly well-developed, well- nourished, white gentleman in no obvious distress.  Vital Signs: Temperature of 98.2, pulse 87, respiratory rate 18, blood pressure 111/59, weight is 168 pounds.  Head and Neck:  Normocephalic, atraumatic skull.  He has no ocular or oral lesions.  There is no scleral icterus. He does have couple of areas on his tongue, which look like fungus infection.  No adenopathy noted in the neck or supraclavicular regions. Lungs:  Clear bilaterally.  Cardiac:  Regular rate and rhythm with a normal S1, S2.  There are no murmurs, rubs, or bruits.  Abdomen:  Soft. He has good bowel sounds.   There is no fluid wave.  There is no palpable abdominal mass.  There is no palpable hepatosplenomegaly.  Back:  No tenderness over the spine, ribs, or hips.  He has good range of motion of his hips.  Extremities:  Some slight tenderness along the lateral aspect of the left lower leg.  There is no venous cord noticed.  Has a negative Homans sign.  He has decent range of motion of his joints. Skin:  No erythema or swelling.  Neurologic:  No focal neurological deficits are noted.  LABORATORY STUDIES:  White cell count is 1, hemoglobin 10.7, hematocrit 32.5, platelet count is 201.  His sodium 140, potassium 4.1, BUN 16, creatinine 1.2.  Glucose 173.  Calcium 9.1 with an albumin of 3.5.  IMPRESSION:  Keith Duffy is a 67 year old gentleman.  He presented with high-risk diffuse large cell lymphoma.  This probably emanated from low- grade lymphoma that he had many years ago.  Again, I think we have made good progress with respect to his lymphoma. I was still not convinced that he has residual disease.  Unfortunately, we cannot treat him today.  Even though this is his last treatment, I just do not think that his blood counts are good enough that we can do this safely and we will have to move his treatment back 1 week.  I will then plan for  a followup PET scan and bone marrow test.  We will get these set up at the end of January or early February of 2015.  The bone marrow biopsy will be done on the 3rd of February at [facility].  I spent about 45 minutes with Keith Duffy and his wife.  I told them that this is a tricky situation that the next scans and bone marrow that we do will really indicate what we have to do next.  Apparently, he had a very high IPI score when he presented.  As such, even if he is in a complete remission, he is at significant risk for recurrence.  That being the case, one might make a good case for getting him to autologous stem cell transplant in first  remission.  If he is not in remission, then I would treat him with salvage chemotherapy and then try to get him to transplant.  I am still not sure why he has this pain in the left lower leg.  The hip pain certainly is better now.  We will have him come back for labs and chemotherapy next week.  I will get his scans and bone marrow set up.  I will plan to see him back once the bone marrow is done and the scans are done.  I have to give Keith Duffy lot of credit for as well as he has done considering the incredibly rough state that he was in when we first saw him.    ______________________________ [name], M.D. PRE/MEDQ  D:  02/08/2013  T:  02/09/2013  Job:  [id number]

## 2013-02-12 ENCOUNTER — Other Ambulatory Visit: Payer: Self-pay | Admitting: Nurse Practitioner

## 2013-02-12 DIAGNOSIS — C8590 Non-Hodgkin lymphoma, unspecified, unspecified site: Secondary | ICD-10-CM

## 2013-02-12 DIAGNOSIS — C799 Secondary malignant neoplasm of unspecified site: Secondary | ICD-10-CM

## 2013-02-15 ENCOUNTER — Other Ambulatory Visit: Payer: Self-pay | Admitting: Oncology

## 2013-02-15 ENCOUNTER — Other Ambulatory Visit (HOSPITAL_BASED_OUTPATIENT_CLINIC_OR_DEPARTMENT_OTHER): Payer: Medicare Other | Admitting: Lab

## 2013-02-15 ENCOUNTER — Ambulatory Visit (HOSPITAL_BASED_OUTPATIENT_CLINIC_OR_DEPARTMENT_OTHER): Payer: Medicare Other

## 2013-02-15 VITALS — BP 139/86 | HR 91 | Temp 97.2°F | Resp 20

## 2013-02-15 DIAGNOSIS — C8589 Other specified types of non-Hodgkin lymphoma, extranodal and solid organ sites: Secondary | ICD-10-CM

## 2013-02-15 DIAGNOSIS — Z5112 Encounter for antineoplastic immunotherapy: Secondary | ICD-10-CM

## 2013-02-15 DIAGNOSIS — C799 Secondary malignant neoplasm of unspecified site: Secondary | ICD-10-CM

## 2013-02-15 DIAGNOSIS — Z5111 Encounter for antineoplastic chemotherapy: Secondary | ICD-10-CM

## 2013-02-15 DIAGNOSIS — C8590 Non-Hodgkin lymphoma, unspecified, unspecified site: Secondary | ICD-10-CM

## 2013-02-15 DIAGNOSIS — C8330 Diffuse large B-cell lymphoma, unspecified site: Secondary | ICD-10-CM

## 2013-02-15 LAB — COMPREHENSIVE METABOLIC PANEL
Albumin: 4.4 g/dL (ref 3.5–5.2)
BUN: 17 mg/dL (ref 6–23)
CO2: 26 mEq/L (ref 19–32)
Calcium: 9.7 mg/dL (ref 8.4–10.5)
Chloride: 101 mEq/L (ref 96–112)
Glucose, Bld: 144 mg/dL — ABNORMAL HIGH (ref 70–99)
Potassium: 4.3 mEq/L (ref 3.5–5.3)
Sodium: 138 mEq/L (ref 135–145)
Total Protein: 6.8 g/dL (ref 6.0–8.3)

## 2013-02-15 LAB — CBC WITH DIFFERENTIAL (CANCER CENTER ONLY)
BASO#: 0 10*3/uL (ref 0.0–0.2)
Eosinophils Absolute: 0 10*3/uL (ref 0.0–0.5)
HGB: 12.4 g/dL — ABNORMAL LOW (ref 13.0–17.1)
LYMPH#: 0.6 10*3/uL — ABNORMAL LOW (ref 0.9–3.3)
LYMPH%: 19.2 % (ref 14.0–48.0)
MCH: 31.3 pg (ref 28.0–33.4)
MCV: 94 fL (ref 82–98)
MONO#: 0.7 10*3/uL (ref 0.1–0.9)
MONO%: 24.1 % — ABNORMAL HIGH (ref 0.0–13.0)
Platelets: 197 10*3/uL (ref 145–400)
RBC: 3.96 10*6/uL — ABNORMAL LOW (ref 4.20–5.70)
WBC: 3.1 10*3/uL — ABNORMAL LOW (ref 4.0–10.0)

## 2013-02-15 LAB — LACTATE DEHYDROGENASE: LDH: 156 U/L (ref 94–250)

## 2013-02-15 MED ORDER — VINCRISTINE SULFATE CHEMO INJECTION 1 MG/ML
2.0000 mg | Freq: Once | INTRAVENOUS | Status: AC
  Administered 2013-02-15: 2 mg via INTRAVENOUS
  Filled 2013-02-15: qty 2

## 2013-02-15 MED ORDER — DEXAMETHASONE SODIUM PHOSPHATE 20 MG/5ML IJ SOLN
20.0000 mg | Freq: Once | INTRAMUSCULAR | Status: AC
  Administered 2013-02-15: 20 mg via INTRAVENOUS

## 2013-02-15 MED ORDER — SODIUM CHLORIDE 0.9 % IJ SOLN
10.0000 mL | INTRAMUSCULAR | Status: DC | PRN
  Administered 2013-02-15: 10 mL
  Filled 2013-02-15: qty 10

## 2013-02-15 MED ORDER — DIPHENHYDRAMINE HCL 25 MG PO CAPS
50.0000 mg | ORAL_CAPSULE | Freq: Once | ORAL | Status: AC
  Administered 2013-02-15: 50 mg via ORAL

## 2013-02-15 MED ORDER — SODIUM CHLORIDE 0.9 % IV SOLN
750.0000 mg/m2 | Freq: Once | INTRAVENOUS | Status: AC
  Administered 2013-02-15: 1440 mg via INTRAVENOUS
  Filled 2013-02-15: qty 72

## 2013-02-15 MED ORDER — DIPHENHYDRAMINE HCL 25 MG PO CAPS
ORAL_CAPSULE | ORAL | Status: AC
  Filled 2013-02-15: qty 2

## 2013-02-15 MED ORDER — ACETAMINOPHEN 325 MG PO TABS
ORAL_TABLET | ORAL | Status: AC
  Filled 2013-02-15: qty 2

## 2013-02-15 MED ORDER — SODIUM CHLORIDE 0.9 % IV SOLN
Freq: Once | INTRAVENOUS | Status: AC
  Administered 2013-02-15: 10:00:00 via INTRAVENOUS

## 2013-02-15 MED ORDER — DOXORUBICIN HCL CHEMO IV INJECTION 2 MG/ML
50.0000 mg/m2 | Freq: Once | INTRAVENOUS | Status: AC
  Administered 2013-02-15: 96 mg via INTRAVENOUS
  Filled 2013-02-15: qty 48

## 2013-02-15 MED ORDER — ONDANSETRON 16 MG/50ML IVPB (CHCC)
INTRAVENOUS | Status: AC
  Filled 2013-02-15: qty 16

## 2013-02-15 MED ORDER — DEXAMETHASONE SODIUM PHOSPHATE 20 MG/5ML IJ SOLN
INTRAMUSCULAR | Status: AC
  Filled 2013-02-15: qty 5

## 2013-02-15 MED ORDER — SULFAMETHOXAZOLE-TRIMETHOPRIM 800-160 MG PO TABS: 1.0000 | ORAL_TABLET | ORAL | Status: DC

## 2013-02-15 MED ORDER — ACETAMINOPHEN 325 MG PO TABS
650.0000 mg | ORAL_TABLET | Freq: Once | ORAL | Status: AC
  Administered 2013-02-15: 650 mg via ORAL

## 2013-02-15 MED ORDER — ONDANSETRON 16 MG/50ML IVPB (CHCC)
16.0000 mg | Freq: Once | INTRAVENOUS | Status: AC
  Administered 2013-02-15: 16 mg via INTRAVENOUS

## 2013-02-15 MED ORDER — HEPARIN SOD (PORK) LOCK FLUSH 100 UNIT/ML IV SOLN
500.0000 [IU] | Freq: Once | INTRAVENOUS | Status: AC | PRN
  Administered 2013-02-15: 500 [IU]
  Filled 2013-02-15: qty 5

## 2013-02-15 MED ORDER — FENTANYL 12 MCG/HR TD PT72: 12.5000 ug | MEDICATED_PATCH | TRANSDERMAL | Status: DC

## 2013-02-15 MED ORDER — SODIUM CHLORIDE 0.9 % IV SOLN
375.0000 mg/m2 | Freq: Once | INTRAVENOUS | Status: AC
  Administered 2013-02-15: 700 mg via INTRAVENOUS
  Filled 2013-02-15: qty 70

## 2013-02-15 NOTE — Patient Instructions (Signed)
[facility] Discharge Instructions for Patients Receiving Chemotherapy  Today you received the following chemotherapy agents Rituxan, Adriamycin, Vincristine, Cytoxan.   To help prevent nausea and vomiting after your treatment, we encourage you to take your nausea medication as prescribed.    If you develop nausea and vomiting that is not controlled by your nausea medication, call the clinic.   BELOW ARE SYMPTOMS THAT SHOULD BE REPORTED IMMEDIATELY:  *FEVER GREATER THAN 100.5 F  *CHILLS WITH OR WITHOUT FEVER  NAUSEA AND VOMITING THAT IS NOT CONTROLLED WITH YOUR NAUSEA MEDICATION  *UNUSUAL SHORTNESS OF BREATH  *UNUSUAL BRUISING OR BLEEDING  TENDERNESS IN MOUTH AND THROAT WITH OR WITHOUT PRESENCE OF ULCERS  *URINARY PROBLEMS  *BOWEL PROBLEMS  UNUSUAL RASH Items with * indicate a potential emergency and should be followed up as soon as possible.  Feel free to call the clinic you have any questions or concerns. The clinic phone number is [phone].    

## 2013-02-15 NOTE — Telephone Encounter (Signed)
Pt's wife requests Bactrim refill, sent refill info to [facility].

## 2013-02-16 ENCOUNTER — Ambulatory Visit (HOSPITAL_BASED_OUTPATIENT_CLINIC_OR_DEPARTMENT_OTHER): Payer: Medicare Other

## 2013-02-16 VITALS — BP 145/81 | HR 83 | Temp 97.2°F | Resp 16

## 2013-02-16 DIAGNOSIS — C8589 Other specified types of non-Hodgkin lymphoma, extranodal and solid organ sites: Secondary | ICD-10-CM

## 2013-02-16 DIAGNOSIS — R29898 Other symptoms and signs involving the musculoskeletal system: Secondary | ICD-10-CM

## 2013-02-16 DIAGNOSIS — Z5189 Encounter for other specified aftercare: Secondary | ICD-10-CM

## 2013-02-16 DIAGNOSIS — C799 Secondary malignant neoplasm of unspecified site: Secondary | ICD-10-CM

## 2013-02-16 MED ORDER — PEGFILGRASTIM INJECTION 6 MG/0.6ML
6.0000 mg | Freq: Once | SUBCUTANEOUS | Status: AC
  Administered 2013-02-16: 6 mg via SUBCUTANEOUS

## 2013-02-16 MED ORDER — PEGFILGRASTIM INJECTION 6 MG/0.6ML
SUBCUTANEOUS | Status: AC
  Filled 2013-02-16: qty 0.6

## 2013-02-16 NOTE — Patient Instructions (Signed)

## 2013-02-23 ENCOUNTER — Other Ambulatory Visit: Payer: Self-pay | Admitting: *Deleted

## 2013-02-23 DIAGNOSIS — C8590 Non-Hodgkin lymphoma, unspecified, unspecified site: Secondary | ICD-10-CM

## 2013-02-23 MED ORDER — FAMCICLOVIR 250 MG PO TABS: 250.0000 mg | ORAL_TABLET | Freq: Every day | ORAL | Status: DC

## 2013-02-24 ENCOUNTER — Other Ambulatory Visit: Payer: Self-pay | Admitting: Hematology & Oncology

## 2013-02-24 ENCOUNTER — Ambulatory Visit (HOSPITAL_COMMUNITY)
Admission: RE | Admit: 2013-02-24 | Discharge: 2013-02-24 | Disposition: A | Discharge status: Home / Self Care | Payer: Medicare Other | Source: Ambulatory Visit | Attending: Hematology & Oncology | Admitting: Hematology & Oncology

## 2013-02-24 DIAGNOSIS — C8590 Non-Hodgkin lymphoma, unspecified, unspecified site: Secondary | ICD-10-CM

## 2013-02-24 DIAGNOSIS — M5137 Other intervertebral disc degeneration, lumbosacral region: Secondary | ICD-10-CM | POA: Insufficient documentation

## 2013-02-24 DIAGNOSIS — M51379 Other intervertebral disc degeneration, lumbosacral region without mention of lumbar back pain or lower extremity pain: Secondary | ICD-10-CM | POA: Insufficient documentation

## 2013-02-24 DIAGNOSIS — R29898 Other symptoms and signs involving the musculoskeletal system: Secondary | ICD-10-CM

## 2013-02-24 DIAGNOSIS — C8589 Other specified types of non-Hodgkin lymphoma, extranodal and solid organ sites: Secondary | ICD-10-CM | POA: Insufficient documentation

## 2013-02-24 MED ORDER — GADOBENATE DIMEGLUMINE 529 MG/ML IV SOLN
15.0000 mL | Freq: Once | INTRAVENOUS | Status: AC | PRN
  Administered 2013-02-24: 15 mL via INTRAVENOUS

## 2013-02-25 ENCOUNTER — Telehealth: Payer: Self-pay | Admitting: Hematology & Oncology

## 2013-02-25 NOTE — Telephone Encounter (Signed)
Pt aware of 1-10 MRI at 2pm. He is aware to check in, in the ER as out patient

## 2013-02-27 ENCOUNTER — Ambulatory Visit (HOSPITAL_BASED_OUTPATIENT_CLINIC_OR_DEPARTMENT_OTHER)
Admission: RE | Admit: 2013-02-27 | Discharge: 2013-02-27 | Disposition: A | Discharge status: Home / Self Care | Payer: Medicare Other | Source: Ambulatory Visit | Attending: Hematology & Oncology | Admitting: Hematology & Oncology

## 2013-02-27 DIAGNOSIS — Z87898 Personal history of other specified conditions: Secondary | ICD-10-CM | POA: Insufficient documentation

## 2013-02-27 DIAGNOSIS — Z8673 Personal history of transient ischemic attack (TIA), and cerebral infarction without residual deficits: Secondary | ICD-10-CM | POA: Insufficient documentation

## 2013-02-27 DIAGNOSIS — R29898 Other symptoms and signs involving the musculoskeletal system: Secondary | ICD-10-CM | POA: Insufficient documentation

## 2013-02-27 DIAGNOSIS — C8590 Non-Hodgkin lymphoma, unspecified, unspecified site: Secondary | ICD-10-CM

## 2013-02-27 MED ORDER — GADOBENATE DIMEGLUMINE 529 MG/ML IV SOLN: 15.0000 mL | Freq: Once | INTRAVENOUS | Status: AC | PRN

## 2013-03-02 ENCOUNTER — Other Ambulatory Visit: Payer: Self-pay | Admitting: Hematology & Oncology

## 2013-03-02 DIAGNOSIS — C8590 Non-Hodgkin lymphoma, unspecified, unspecified site: Secondary | ICD-10-CM

## 2013-03-02 MED ORDER — OXYCODONE HCL 10 MG PO TABS: 10.0000 mg | ORAL_TABLET | ORAL | Status: DC | PRN

## 2013-03-04 ENCOUNTER — Encounter (HOSPITAL_COMMUNITY): Payer: Self-pay | Admitting: Pharmacy Technician

## 2013-03-05 ENCOUNTER — Other Ambulatory Visit (HOSPITAL_COMMUNITY): Payer: Medicare Other | Admitting: Radiology

## 2013-03-05 ENCOUNTER — Encounter (HOSPITAL_COMMUNITY): Payer: Self-pay

## 2013-03-05 ENCOUNTER — Ambulatory Visit (HOSPITAL_COMMUNITY)
Admission: RE | Admit: 2013-03-05 | Discharge: 2013-03-05 | Disposition: A | Discharge status: Home / Self Care | Payer: Medicare Other | Source: Ambulatory Visit | Attending: Hematology & Oncology | Admitting: Hematology & Oncology

## 2013-03-05 DIAGNOSIS — C8589 Other specified types of non-Hodgkin lymphoma, extranodal and solid organ sites: Secondary | ICD-10-CM | POA: Insufficient documentation

## 2013-03-05 DIAGNOSIS — C8590 Non-Hodgkin lymphoma, unspecified, unspecified site: Secondary | ICD-10-CM

## 2013-03-05 LAB — GLUCOSE, CAPILLARY: Glucose-Capillary: 139 mg/dL — ABNORMAL HIGH (ref 70–99)

## 2013-03-05 NOTE — Discharge Instructions (Signed)
Lumbar Puncture °A lumbar puncture, or spinal tap, is a procedure in which a small amount of the fluid that surrounds the brain and spinal cord is removed and examined. The fluid is called the cerebrospinal fluid. This procedure may be done to:  °· Help diagnose various problems, such as meningitis, encephalitis, multiple sclerosis, and AIDS.   °· Remove fluid and relieve pressure that occurs with certain types of headaches.   °· Look for bleeding within the brain and spinal cord areas (central nervous system).   °· Place medicine into the spinal fluid.   °LET YOUR HEALTH CARE PROVIDER KNOW ABOUT: °· Any allergies you have. °· All medicines you are taking, including vitamins, herbs, eye drops, creams, and over-the-counter medicines. °· Previous problems you or members of your family have had with the use of anesthetics. °· Any blood disorders you have. °· Previous surgeries you have had. °· Medical conditions you have. °RISKS AND COMPLICATIONS °Generally, this is a safe procedure. However, as with any procedure, complications can occur. Possible complications include:  °· Spinal headache. This is a severe headache that occurs when there is a leak of spinal fluid. A spinal headache causes discomfort but is not dangerous. If it persists, another procedure may be done to treat the headache. °· Bleeding. This most often occurs in people with bleeding disorders. These are disorders in which the blood does not clot normally.   °· Infection at the insertion site that can spread to the bone or spinal fluid.  °· Formation of a spinal cord tumor (rare). °· Brain herniation or movement of the brain into the spinal cord (rare). °· Inability to move (extremely rare). °BEFORE THE PROCEDURE °· You may have blood tests done. These tests can help tell how well your kidneys and liver are working. They can also show how well your blood clots.   °· If you take blood thinners (anticoagulant medicine), ask your health care provider if  and when you should stop taking them.   °· Your health care provider may order a CT scan of your brain. °· Make arrangements for someone to drive you home after the procedure.     °PROCEDURE °· You will be positioned so that the spaces between the bones of the spine (vertebrae) are as wide as possible. This will make it easier to pass the needle into the spinal canal.  °· Depending on your age and size, you may lie on your side, curled up with your knees under your chin. Or, you may sit with your head resting on a pillow that is placed at waist level. °· The skin covering the lower back (or lumbar region) will be cleaned.   °· The skin may be numbed with medicine. °· You may be given pain medicine or a medicine to help you relax (sedative).   °· A small needle will be inserted in the skin until it enters the space that contains the spinal fluid. The needle will not enter the spinal cord.   °· The spinal fluid will be collected into tubes.   °· The needle will be withdrawn, and a bandage will be placed on the site.   °AFTER THE PROCEDURE °· You will remain lying down for 1 hour or for as long as your health care provider suggests.   °· The spinal fluid will be sent to a laboratory to be examined. The results of the examination may be available before you go home. °· A test, called a culture, may be taken of the spinal fluid if your health care provider thinks you have an infection. If cultures   were taken for exam, the results will usually be available in a couple of days.  Document Released: 02/02/2000 Document Revised: 11/25/2012 Document Reviewed: 10/12/2012 [facility] Patient Information 2014 [place].  Lie flat as much as possible for the rest of the day.

## 2013-03-05 NOTE — Procedures (Signed)
PROCEDURE: Informed consent was obtained from the patient prior to the procedure, including potential complications of headache, allergy, and pain. With the patient prone, the lower back was prepped with Betadine. 1% Lidocaine was used for local anesthesia. Lumbar puncture was performed at the [L4-5] level using a [20] gauge needle with return of [clear] CSF. [11.5]ml of CSF were obtained for laboratory studies. The patient tolerated the procedure well and there were no apparent complications. IMPRESSION: [Successful lumbar puncture under fluorocopy.]

## 2013-03-08 ENCOUNTER — Emergency Department (HOSPITAL_COMMUNITY)
Admission: EM | Admit: 2013-03-08 | Discharge: 2013-03-08 | Disposition: A | Discharge status: Home / Self Care | Payer: Medicare Other | Attending: Emergency Medicine | Admitting: Emergency Medicine

## 2013-03-08 ENCOUNTER — Encounter (HOSPITAL_COMMUNITY): Payer: Self-pay | Admitting: Emergency Medicine

## 2013-03-08 DIAGNOSIS — Z794 Long term (current) use of insulin: Secondary | ICD-10-CM | POA: Insufficient documentation

## 2013-03-08 DIAGNOSIS — G510 Bell's palsy: Secondary | ICD-10-CM

## 2013-03-08 DIAGNOSIS — Z79899 Other long term (current) drug therapy: Secondary | ICD-10-CM | POA: Insufficient documentation

## 2013-03-08 DIAGNOSIS — I10 Essential (primary) hypertension: Secondary | ICD-10-CM | POA: Insufficient documentation

## 2013-03-08 DIAGNOSIS — E119 Type 2 diabetes mellitus without complications: Secondary | ICD-10-CM | POA: Insufficient documentation

## 2013-03-08 DIAGNOSIS — M545 Low back pain, unspecified: Secondary | ICD-10-CM | POA: Insufficient documentation

## 2013-03-08 DIAGNOSIS — Z87898 Personal history of other specified conditions: Secondary | ICD-10-CM | POA: Insufficient documentation

## 2013-03-08 DIAGNOSIS — M79609 Pain in unspecified limb: Secondary | ICD-10-CM | POA: Insufficient documentation

## 2013-03-08 DIAGNOSIS — Z791 Long term (current) use of non-steroidal anti-inflammatories (NSAID): Secondary | ICD-10-CM | POA: Insufficient documentation

## 2013-03-08 LAB — GLUCOSE, CAPILLARY: GLUCOSE-CAPILLARY: 116 mg/dL — AB (ref 70–99)

## 2013-03-08 MED ORDER — PREDNISONE 20 MG PO TABS: ORAL_TABLET | ORAL | Status: DC

## 2013-03-08 NOTE — Discharge Instructions (Signed)
Bell's Palsy  Bell's palsy is a condition in which the muscles on one side of the face cannot move (paralysis). This is because the nerves in the face are paralyzed. It is most often thought to be caused by a virus. The virus causes swelling of the nerve that controls movement on one side of the face. The nerve travels through a tight space surrounded by bone. When the nerve swells, it can be compressed by the bone. This results in damage to the protective covering around the nerve. This damage interferes with how the nerve communicates with the muscles of the face. As a result, it can cause weakness or paralysis of the facial muscles.   Injury (trauma), tumor, and surgery may cause Bell's palsy, but most of the time the cause is unknown. It is a relatively common condition. It starts suddenly (abrupt onset) with the paralysis usually ending within 2 days. Bell's palsy is not dangerous. But because the eye does not close properly, you may need care to keep the eye from getting dry. This can include splinting (to keep the eye shut) or moistening with artificial tears. Bell's palsy very seldom occurs on both sides of the face at the same time.  SYMPTOMS    Eyebrow sagging.   Drooping of the eyelid and corner of the mouth.   Inability to close one eye.   Loss of taste on the front of the tongue.   Sensitivity to loud noises.  TREATMENT   The treatment is usually non-surgical. If the patient is seen within the first 24 to 48 hours, a short course of steroids may be prescribed, in an attempt to shorten the length of the condition. Antiviral medicines may also be used with the steroids, but it is unclear if they are helpful.   You will need to protect your eye, if you cannot close it. The cornea (clear covering over your eye) will become dry and can be damaged. Artificial tears can be used to keep your eye moist. Glasses or an eye patch should be worn to protect your eye.  PROGNOSIS   Recovery is variable, ranging  from days to months. Although the problem usually goes away completely (about 80% of cases resolve), predicting the outcome is impossible. Most people improve within 3 weeks of when the symptoms began. Improvement may continue for 3 to 6 months. A small number of people have moderate to severe weakness that is permanent.   HOME CARE INSTRUCTIONS    If your caregiver prescribed medication to reduce swelling in the nerve, use as directed. Do not stop taking the medication unless directed by your caregiver.   Use moisturizing eye drops as needed to prevent drying of your eye, as directed by your caregiver.   Protect your eye, as directed by your caregiver.   Use facial massage and exercises, as directed by your caregiver.   Perform your normal activities, and get your normal rest.  SEEK IMMEDIATE MEDICAL CARE IF:    There is pain, redness or irritation in the eye.   You or your child has an oral temperature above 102 F (38.9 C), not controlled by medicine.  MAKE SURE YOU:    Understand these instructions.   Will watch your condition.   Will get help right away if you are not doing well or get worse.  Document Released: 02/04/2005 Document Revised: 04/29/2011 Document Reviewed: 02/13/2009  [employer] Patient Information 2014 [employer].

## 2013-03-08 NOTE — ED Notes (Signed)
Pt has right side facial droop that Saturday. Had LP done on Friday. Has been having left sided leg weakness x 1 months. Has hx of non hodkins lymphoma. Strength equal bilaterally in upper extremities.

## 2013-03-08 NOTE — ED Provider Notes (Signed)
CSN: [id number]     Arrival date & time 03/08/13  1047 History   First MD Initiated Contact with Patient 03/08/13 1057     Chief Complaint  Patient presents with  . Facial Droop    right   (Consider location/radiation/quality/duration/timing/severity/associated sxs/prior Treatment) HPI Comments: Patient presents to the ER for evaluation of right-sided facial droop. Patient reports his symptoms began 3 days ago. He has had numbness and drooping of the right side of his face, increased tearing from his right eye with eye pain. He has not had any headache.  Patient also reports left leg pain and weakness. He has had lumbar pain as well. This has been ongoing for a month. It is being worked up by his oncologist. He had MRI of lumbar spine, MRI and had performed a little over a week ago for this. He also underwent lumbar puncture 4 days ago, results are not back.   Past Medical History  Diagnosis Date  . Back pain   . Hypertension   . Diabetes mellitus without complication   . Cancer     non hodkins lymphoma   History reviewed. No pertinent past surgical history. Family History  Problem Relation Age of Onset  . Heart attack Father   . Heart attack Maternal Grandfather    History  Substance Use Topics  . Smoking status: Never Smoker   . Smokeless tobacco: Not on file  . Alcohol Use: No    Review of Systems  Eyes: Positive for pain (And tearing, right eye).  Musculoskeletal: Positive for back pain.  Neurological: Positive for weakness (Right face) and numbness (Right face).  All other systems reviewed and are negative.    Allergies  Aspartame and phenylalanine and Codeine  Home Medications   Current Outpatient Rx  Name  Route  Sig  Dispense  Refill  . acetaminophen (TYLENOL) 500 MG tablet   Oral   Take 1,000 mg by mouth every 6 (six) hours as needed for pain.         . famciclovir (FAMVIR) 250 MG tablet   Oral   Take 250 mg by mouth daily. To prevent shingles        . fentaNYL (DURAGESIC - DOSED MCG/HR) 12 MCG/HR   Transdermal   Place 1 patch (12.5 mcg total) onto the skin every 3 (three) days.   10 patch   0   . insulin aspart (NOVOLOG FLEXPEN) 100 UNIT/ML SOPN FlexPen   Subcutaneous   Inject 0-9 Units into the skin 3 (three) times daily with meals. CBG < 70: eat or drink something sweet & recheck blood sugar. CBG 70 - 120: 0 units  CBG 121 - 150: 1 unit  CBG 151 - 200: 2 units  CBG 201 - 250: 3 units  CBG 251 - 300: 5 units  CBG 301 - 350: 7 units  CBG 351 - 400: 9 units  CBG > 400: call MD   5 pen   0   . insulin glargine (LANTUS) 100 units/mL SOLN   Subcutaneous   Inject 25 Units into the skin at bedtime.         . lidocaine-prilocaine (EMLA) cream   Topical   Apply 1 application topically daily as needed (For port-a-cath.).         Marland Kitchen meloxicam (MOBIC) 15 MG tablet   Oral   Take 15 mg by mouth every morning.         . metoprolol tartrate (LOPRESSOR) 25 MG tablet  Oral   Take 25 mg by mouth 2 (two) times daily.         . Oxycodone HCl 10 MG TABS   Oral   Take 1-2 tablets (10-20 mg total) by mouth every 4 (four) hours as needed. breakthrough pain   180 tablet   0    BP 154/78  Pulse 86  Temp(Src) 97.5 F (36.4 C) (Oral)  Resp 22  SpO2 100% Physical Exam  Constitutional: He is oriented to person, place, and time. He appears well-developed and well-nourished. No distress.  HENT:  Head: Normocephalic and atraumatic.  Right Ear: Hearing normal.  Left Ear: Hearing normal.  Nose: Nose normal.  Mouth/Throat: Oropharynx is clear and moist and mucous membranes are normal.  Eyes: EOM are normal. Pupils are equal, round, and reactive to light. Right eye exhibits no chemosis, no discharge and no exudate. Right conjunctiva is injected (increased tearing).  Neck: Normal range of motion. Neck supple.  Cardiovascular: Regular rhythm, S1 normal and S2 normal.  Exam reveals no gallop and no friction rub.   No murmur  heard. Pulmonary/Chest: Effort normal and breath sounds normal. No respiratory distress. He exhibits no tenderness.  Abdominal: Soft. Normal appearance and bowel sounds are normal. There is no hepatosplenomegaly. There is no tenderness. There is no rebound, no guarding, no tenderness at McBurney's point and negative Murphy's sign. No hernia.  Musculoskeletal: Normal range of motion.  Neurological: He is alert and oriented to person, place, and time. He has normal strength. A cranial nerve deficit (Right facial nerve palsy) is present. No sensory deficit. Coordination normal. GCS eye subscore is 4. GCS verbal subscore is 5. GCS motor subscore is 6.  Skin: Skin is warm, dry and intact. No rash noted. No cyanosis.  Psychiatric: He has a normal mood and affect. His speech is normal and behavior is normal. Thought content normal.    ED Course  Procedures (including critical care time) Labs Review Labs Reviewed - No data to display Imaging Review No results found.  EKG Interpretation   None       MDM  Diagnosis: Bell's palsy  Patient presents to the ER for evaluation of 3 days of right-sided facial droop. Symptoms are isolated to the face and include eyelid and forehead indicating that this is Bell's palsy, not a stroke or CNS problem. The remainder of his neurologic exam is normal.  I do not see any indication for imaging. Patient had MRI of the head and lumbar spine one week ago because of his leg problem. He has a history of non-Hodgkin's lymphoma. Patient has had weakness and pain in the left leg and had lumbar puncture performed 4 days ago. This was for flow cytology to evaluate for lymphoma in the spine. I do not see any way that we Bell's palsy as a complication of this procedure. He does not have headache or signs of lumbar puncture headache.  Case was discussed with Doctor Marin Olp. He agrees that the patient should be treated with prednisone. He is already on Famvir, no concern for  herpetic causes.    Orpah Greek, MD 03/08/13 1149

## 2013-03-09 ENCOUNTER — Encounter: Payer: Self-pay | Admitting: Hematology & Oncology

## 2013-03-09 ENCOUNTER — Ambulatory Visit (HOSPITAL_BASED_OUTPATIENT_CLINIC_OR_DEPARTMENT_OTHER): Payer: Medicare Other | Admitting: Hematology & Oncology

## 2013-03-09 VITALS — BP 108/65 | HR 81 | Temp 97.0°F | Resp 18 | Ht 70.0 in | Wt 161.0 lb

## 2013-03-09 DIAGNOSIS — C8590 Non-Hodgkin lymphoma, unspecified, unspecified site: Secondary | ICD-10-CM

## 2013-03-09 DIAGNOSIS — C799 Secondary malignant neoplasm of unspecified site: Secondary | ICD-10-CM

## 2013-03-09 DIAGNOSIS — C8589 Other specified types of non-Hodgkin lymphoma, extranodal and solid organ sites: Secondary | ICD-10-CM

## 2013-03-09 MED ORDER — MELOXICAM 15 MG PO TABS: 15.0000 mg | ORAL_TABLET | Freq: Every morning | ORAL | Status: DC

## 2013-03-09 MED ORDER — FENTANYL 12 MCG/HR TD PT72: 12.5000 ug | MEDICATED_PATCH | TRANSDERMAL | Status: DC

## 2013-03-11 ENCOUNTER — Other Ambulatory Visit: Payer: Self-pay | Admitting: Neurosurgery

## 2013-03-11 ENCOUNTER — Telehealth: Payer: Self-pay | Admitting: *Deleted

## 2013-03-11 ENCOUNTER — Encounter (HOSPITAL_COMMUNITY): Payer: Self-pay | Admitting: Pharmacy Technician

## 2013-03-11 NOTE — Telephone Encounter (Signed)
Called bed placement at [facility] long for chemotherapy bed beginning 03/12/13.  Called patient to let him know to wait for bed control to call him in the morning for possible admssion.

## 2013-03-11 NOTE — Telephone Encounter (Signed)
Manuela Schwartz from [facility] called to let us know that patients Keith Duffy is scheduled for Jan 26 at [facility] at 2:45.  Patient will be transported to [facility] from [facility] at that time. Dr. Niel Hummer aware of this.

## 2013-03-12 ENCOUNTER — Other Ambulatory Visit: Payer: Self-pay

## 2013-03-12 ENCOUNTER — Encounter (HOSPITAL_COMMUNITY): Payer: Self-pay

## 2013-03-12 ENCOUNTER — Other Ambulatory Visit: Payer: Self-pay | Admitting: Hematology & Oncology

## 2013-03-12 ENCOUNTER — Inpatient Hospital Stay (HOSPITAL_COMMUNITY)
Admission: RE | Admit: 2013-03-12 | Discharge: 2013-03-22 | DRG: 827 | Disposition: A | Discharge status: Home / Self Care | Payer: Medicare Other | Source: Ambulatory Visit | Attending: Hematology & Oncology | Admitting: Hematology & Oncology

## 2013-03-12 DIAGNOSIS — Z794 Long term (current) use of insulin: Secondary | ICD-10-CM

## 2013-03-12 DIAGNOSIS — Z5111 Encounter for antineoplastic chemotherapy: Principal | ICD-10-CM

## 2013-03-12 DIAGNOSIS — C8589 Other specified types of non-Hodgkin lymphoma, extranodal and solid organ sites: Secondary | ICD-10-CM | POA: Diagnosis present

## 2013-03-12 DIAGNOSIS — M216X9 Other acquired deformities of unspecified foot: Secondary | ICD-10-CM | POA: Diagnosis present

## 2013-03-12 DIAGNOSIS — I420 Dilated cardiomyopathy: Secondary | ICD-10-CM

## 2013-03-12 DIAGNOSIS — D61818 Other pancytopenia: Secondary | ICD-10-CM

## 2013-03-12 DIAGNOSIS — T451X5A Adverse effect of antineoplastic and immunosuppressive drugs, initial encounter: Secondary | ICD-10-CM | POA: Diagnosis not present

## 2013-03-12 DIAGNOSIS — R7401 Elevation of levels of liver transaminase levels: Secondary | ICD-10-CM

## 2013-03-12 DIAGNOSIS — D638 Anemia in other chronic diseases classified elsewhere: Secondary | ICD-10-CM

## 2013-03-12 DIAGNOSIS — D6481 Anemia due to antineoplastic chemotherapy: Secondary | ICD-10-CM | POA: Diagnosis present

## 2013-03-12 DIAGNOSIS — D702 Other drug-induced agranulocytosis: Secondary | ICD-10-CM | POA: Diagnosis present

## 2013-03-12 DIAGNOSIS — E871 Hypo-osmolality and hyponatremia: Secondary | ICD-10-CM | POA: Diagnosis not present

## 2013-03-12 DIAGNOSIS — E1165 Type 2 diabetes mellitus with hyperglycemia: Secondary | ICD-10-CM

## 2013-03-12 DIAGNOSIS — C8590 Non-Hodgkin lymphoma, unspecified, unspecified site: Secondary | ICD-10-CM

## 2013-03-12 DIAGNOSIS — D709 Neutropenia, unspecified: Secondary | ICD-10-CM

## 2013-03-12 DIAGNOSIS — B370 Candidal stomatitis: Secondary | ICD-10-CM | POA: Diagnosis not present

## 2013-03-12 DIAGNOSIS — C801 Malignant (primary) neoplasm, unspecified: Secondary | ICD-10-CM

## 2013-03-12 DIAGNOSIS — R740 Nonspecific elevation of levels of transaminase and lactic acid dehydrogenase [LDH]: Secondary | ICD-10-CM

## 2013-03-12 DIAGNOSIS — R339 Retention of urine, unspecified: Secondary | ICD-10-CM | POA: Diagnosis not present

## 2013-03-12 DIAGNOSIS — Z8249 Family history of ischemic heart disease and other diseases of the circulatory system: Secondary | ICD-10-CM

## 2013-03-12 DIAGNOSIS — R000 Tachycardia, unspecified: Secondary | ICD-10-CM

## 2013-03-12 DIAGNOSIS — I959 Hypotension, unspecified: Secondary | ICD-10-CM

## 2013-03-12 DIAGNOSIS — I10 Essential (primary) hypertension: Secondary | ICD-10-CM | POA: Diagnosis present

## 2013-03-12 DIAGNOSIS — G510 Bell's palsy: Secondary | ICD-10-CM | POA: Diagnosis present

## 2013-03-12 DIAGNOSIS — D630 Anemia in neoplastic disease: Secondary | ICD-10-CM | POA: Diagnosis present

## 2013-03-12 DIAGNOSIS — R5383 Other fatigue: Secondary | ICD-10-CM

## 2013-03-12 DIAGNOSIS — R109 Unspecified abdominal pain: Secondary | ICD-10-CM | POA: Diagnosis present

## 2013-03-12 DIAGNOSIS — C8339 Primary central nervous system lymphoma: Secondary | ICD-10-CM | POA: Diagnosis present

## 2013-03-12 DIAGNOSIS — C799 Secondary malignant neoplasm of unspecified site: Secondary | ICD-10-CM

## 2013-03-12 DIAGNOSIS — R112 Nausea with vomiting, unspecified: Secondary | ICD-10-CM | POA: Diagnosis not present

## 2013-03-12 DIAGNOSIS — E43 Unspecified severe protein-calorie malnutrition: Secondary | ICD-10-CM

## 2013-03-12 DIAGNOSIS — IMO0002 Reserved for concepts with insufficient information to code with codable children: Secondary | ICD-10-CM

## 2013-03-12 DIAGNOSIS — R5381 Other malaise: Secondary | ICD-10-CM | POA: Diagnosis present

## 2013-03-12 DIAGNOSIS — R5081 Fever presenting with conditions classified elsewhere: Secondary | ICD-10-CM

## 2013-03-12 DIAGNOSIS — E119 Type 2 diabetes mellitus without complications: Secondary | ICD-10-CM

## 2013-03-12 DIAGNOSIS — R531 Weakness: Secondary | ICD-10-CM

## 2013-03-12 DIAGNOSIS — C7949 Secondary malignant neoplasm of other parts of nervous system: Secondary | ICD-10-CM

## 2013-03-12 DIAGNOSIS — R197 Diarrhea, unspecified: Secondary | ICD-10-CM | POA: Diagnosis not present

## 2013-03-12 LAB — CBC WITH DIFFERENTIAL/PLATELET
BASOS ABS: 0 10*3/uL (ref 0.0–0.1)
Basophils Relative: 0 % (ref 0–1)
EOS ABS: 0 10*3/uL (ref 0.0–0.7)
Eosinophils Relative: 0 % (ref 0–5)
HCT: 35.2 % — ABNORMAL LOW (ref 39.0–52.0)
Hemoglobin: 12.4 g/dL — ABNORMAL LOW (ref 13.0–17.0)
LYMPHS ABS: 1.4 10*3/uL (ref 0.7–4.0)
Lymphocytes Relative: 12 % (ref 12–46)
MCH: 32.5 pg (ref 26.0–34.0)
MCHC: 35.2 g/dL (ref 30.0–36.0)
MCV: 92.4 fL (ref 78.0–100.0)
Monocytes Absolute: 0.8 10*3/uL (ref 0.1–1.0)
Monocytes Relative: 7 % (ref 3–12)
NEUTROS PCT: 81 % — AB (ref 43–77)
Neutro Abs: 9.3 10*3/uL — ABNORMAL HIGH (ref 1.7–7.7)
PLATELETS: 199 10*3/uL (ref 150–400)
RBC: 3.81 MIL/uL — ABNORMAL LOW (ref 4.22–5.81)
RDW: 15.1 % (ref 11.5–15.5)
WBC: 11.5 10*3/uL — AB (ref 4.0–10.5)

## 2013-03-12 LAB — COMPREHENSIVE METABOLIC PANEL
ALBUMIN: 3.7 g/dL (ref 3.5–5.2)
ALT: 16 U/L (ref 0–53)
AST: 13 U/L (ref 0–37)
Alkaline Phosphatase: 107 U/L (ref 39–117)
BUN: 21 mg/dL (ref 6–23)
CO2: 26 meq/L (ref 19–32)
Calcium: 9.4 mg/dL (ref 8.4–10.5)
Chloride: 97 mEq/L (ref 96–112)
Creatinine, Ser: 0.81 mg/dL (ref 0.50–1.35)
GFR calc Af Amer: >90 mL/min (ref >90)
GFR calc non Af Amer: >90 mL/min (ref >90)
Glucose, Bld: 148 mg/dL — ABNORMAL HIGH (ref 70–99)
Potassium: 3.7 mEq/L (ref 3.7–5.3)
Sodium: 136 mEq/L — ABNORMAL LOW (ref 137–147)
Total Bilirubin: 0.5 mg/dL (ref 0.3–1.2)
Total Protein: 6.6 g/dL (ref 6.0–8.3)

## 2013-03-12 LAB — GLUCOSE, CAPILLARY: Glucose-Capillary: 118 mg/dL — ABNORMAL HIGH (ref 70–99)

## 2013-03-12 LAB — MAGNESIUM: MAGNESIUM: 2.1 mg/dL (ref 1.5–2.5)

## 2013-03-12 LAB — PHOSPHORUS: PHOSPHORUS: 3.8 mg/dL (ref 2.3–4.6)

## 2013-03-12 MED ORDER — INSULIN ASPART 100 UNIT/ML ~~LOC~~ SOLN
6.0000 [IU] | Freq: Three times a day (TID) | SUBCUTANEOUS | Status: DC
  Administered 2013-03-14 – 2013-03-19 (×7): 6 [IU] via SUBCUTANEOUS

## 2013-03-12 MED ORDER — SODIUM BICARBONATE/SODIUM CHLORIDE MOUTHWASH
Freq: Every day | OROMUCOSAL | Status: DC
  Administered 2013-03-13 (×4): via OROMUCOSAL
  Administered 2013-03-13: 1 via OROMUCOSAL
  Administered 2013-03-13 – 2013-03-22 (×39): via OROMUCOSAL
  Filled 2013-03-12 (×3): qty 1000

## 2013-03-12 MED ORDER — INSULIN ASPART 100 UNIT/ML ~~LOC~~ SOLN
0.0000 [IU] | Freq: Three times a day (TID) | SUBCUTANEOUS | Status: DC
  Administered 2013-03-13 (×2): 3 [IU] via SUBCUTANEOUS
  Administered 2013-03-14: 4 [IU] via SUBCUTANEOUS
  Administered 2013-03-14: 7 [IU] via SUBCUTANEOUS
  Administered 2013-03-14: 3 [IU] via SUBCUTANEOUS
  Administered 2013-03-16 – 2013-03-17 (×4): 4 [IU] via SUBCUTANEOUS
  Administered 2013-03-18: 3 [IU] via SUBCUTANEOUS
  Administered 2013-03-18 – 2013-03-19 (×2): 4 [IU] via SUBCUTANEOUS

## 2013-03-12 MED ORDER — SODIUM BICARBONATE 8.4 % IV SOLN
INTRAVENOUS | Status: DC
  Administered 2013-03-12 – 2013-03-14 (×3): via INTRAVENOUS
  Filled 2013-03-12 (×8): qty 1000

## 2013-03-12 MED ORDER — INSULIN ASPART 100 UNIT/ML ~~LOC~~ SOLN: 0.0000 [IU] | Freq: Three times a day (TID) | SUBCUTANEOUS | Status: DC

## 2013-03-12 MED ORDER — METOPROLOL TARTRATE 25 MG PO TABS
25.0000 mg | ORAL_TABLET | Freq: Two times a day (BID) | ORAL | Status: DC
  Administered 2013-03-12 – 2013-03-22 (×19): 25 mg via ORAL
  Filled 2013-03-12 (×25): qty 1

## 2013-03-12 MED ORDER — BIOTENE DRY MOUTH MT LIQD
15.0000 mL | Freq: Every day | OROMUCOSAL | Status: DC
  Administered 2013-03-13 – 2013-03-22 (×42): 15 mL via OROMUCOSAL

## 2013-03-12 MED ORDER — SODIUM CHLORIDE 0.45 % IV SOLN: INTRAVENOUS | Status: DC

## 2013-03-12 MED ORDER — ENOXAPARIN SODIUM 40 MG/0.4ML ~~LOC~~ SOLN
40.0000 mg | SUBCUTANEOUS | Status: DC
  Administered 2013-03-12 – 2013-03-13 (×2): 40 mg via SUBCUTANEOUS
  Filled 2013-03-12 (×3): qty 0.4

## 2013-03-12 MED ORDER — OXYCODONE HCL 5 MG PO TABS
10.0000 mg | ORAL_TABLET | ORAL | Status: DC | PRN
  Administered 2013-03-12: 10 mg via ORAL
  Administered 2013-03-13 – 2013-03-18 (×4): 20 mg via ORAL
  Administered 2013-03-19: 10 mg via ORAL
  Filled 2013-03-12: qty 2
  Filled 2013-03-12 (×5): qty 4

## 2013-03-12 MED ORDER — FENTANYL 12 MCG/HR TD PT72
12.5000 ug | MEDICATED_PATCH | TRANSDERMAL | Status: DC
  Administered 2013-03-14 – 2013-03-22 (×5): 12.5 ug via TRANSDERMAL
  Filled 2013-03-12 (×5): qty 1

## 2013-03-12 MED ORDER — INSULIN GLARGINE 100 UNIT/ML ~~LOC~~ SOLN
25.0000 [IU] | Freq: Every day | SUBCUTANEOUS | Status: DC
  Administered 2013-03-12 – 2013-03-21 (×10): 25 [IU] via SUBCUTANEOUS
  Filled 2013-03-12 (×12): qty 0.25

## 2013-03-12 MED ORDER — FAMCICLOVIR 500 MG PO TABS
500.0000 mg | ORAL_TABLET | Freq: Every day | ORAL | Status: DC
  Administered 2013-03-13 – 2013-03-22 (×9): 500 mg via ORAL
  Filled 2013-03-12 (×11): qty 1

## 2013-03-12 MED ORDER — HYDROMORPHONE HCL PF 1 MG/ML IJ SOLN: 1.0000 mg | INTRAMUSCULAR | Status: DC | PRN

## 2013-03-12 NOTE — Progress Notes (Signed)
This office note has been dictated.

## 2013-03-12 NOTE — Progress Notes (Signed)
Patient arrived to unit at this time. Alert and orientedx3. Pleasant. Placed patient comfortably in bed.- Sandie Ano RN

## 2013-03-13 DIAGNOSIS — R339 Retention of urine, unspecified: Secondary | ICD-10-CM

## 2013-03-13 DIAGNOSIS — E876 Hypokalemia: Secondary | ICD-10-CM

## 2013-03-13 LAB — GLUCOSE, CAPILLARY
GLUCOSE-CAPILLARY: 127 mg/dL — AB (ref 70–99)
Glucose-Capillary: 98 mg/dL (ref 70–99)
Glucose-Capillary: 149 mg/dL — ABNORMAL HIGH (ref 70–99)
Glucose-Capillary: 187 mg/dL — ABNORMAL HIGH (ref 70–99)

## 2013-03-13 LAB — URINALYSIS, DIPSTICK ONLY
BILIRUBIN URINE: NEGATIVE
Glucose, UA: 100 mg/dL — AB
Hgb urine dipstick: NEGATIVE
KETONES UR: NEGATIVE mg/dL
LEUKOCYTES UA: NEGATIVE
Nitrite: NEGATIVE
PH: 7.5 (ref 5.0–8.0)
PROTEIN: NEGATIVE mg/dL
Specific Gravity, Urine: 1.014 (ref 1.005–1.030)
Urobilinogen, UA: 0.2 mg/dL (ref 0.0–1.0)

## 2013-03-13 LAB — URINALYSIS, ROUTINE W REFLEX MICROSCOPIC
BILIRUBIN URINE: NEGATIVE
Glucose, UA: NEGATIVE mg/dL
HGB URINE DIPSTICK: NEGATIVE
Ketones, ur: NEGATIVE mg/dL
Leukocytes, UA: NEGATIVE
NITRITE: NEGATIVE
PROTEIN: NEGATIVE mg/dL
SPECIFIC GRAVITY, URINE: 1.013 (ref 1.005–1.030)
Urobilinogen, UA: 0.2 mg/dL (ref 0.0–1.0)
pH: 7 (ref 5.0–8.0)

## 2013-03-13 LAB — BASIC METABOLIC PANEL
BUN: 20 mg/dL (ref 6–23)
CALCIUM: 8.9 mg/dL (ref 8.4–10.5)
CHLORIDE: 97 meq/L (ref 96–112)
CO2: 22 meq/L (ref 19–32)
CREATININE: 0.91 mg/dL (ref 0.50–1.35)
GFR calc Af Amer: >90 mL/min (ref >90)
GFR calc non Af Amer: 87 mL/min — ABNORMAL LOW (ref >90)
Glucose, Bld: 111 mg/dL — ABNORMAL HIGH (ref 70–99)
Potassium: 3 mEq/L — ABNORMAL LOW (ref 3.7–5.3)
Sodium: 135 mEq/L — ABNORMAL LOW (ref 137–147)

## 2013-03-13 MED ORDER — METHOTREXATE SODIUM CHEMO INJECTION 25 MG/ML PF
6.9000 g/m2 | Freq: Once | INTRAMUSCULAR | Status: DC
  Filled 2013-03-13: qty 524.4

## 2013-03-13 MED ORDER — LEUCOVORIN CALCIUM INJECTION 100 MG: 15.0000 mg/m2 | INTRAMUSCULAR | Status: DC

## 2013-03-13 MED ORDER — ONDANSETRON 8 MG/NS 50 ML IVPB
8.0000 mg | Freq: Once | INTRAVENOUS | Status: AC
  Administered 2013-03-13: 8 mg via INTRAVENOUS
  Filled 2013-03-13: qty 8

## 2013-03-13 MED ORDER — PROCHLORPERAZINE MALEATE 10 MG PO TABS: 10.0000 mg | ORAL_TABLET | Freq: Four times a day (QID) | ORAL | Status: DC | PRN

## 2013-03-13 MED ORDER — THIOTEPA CHEMO INJECTION 15 MG
35.0000 mg/m2 | Freq: Once | INTRAVENOUS | Status: AC
  Administered 2013-03-13: 66.56 mg via INTRAVENOUS
  Filled 2013-03-13 (×3): qty 6.4

## 2013-03-13 MED ORDER — HOT PACK MISC ONCOLOGY
1.0000 | Freq: Once | Status: AC | PRN
  Filled 2013-03-13: qty 1

## 2013-03-13 MED ORDER — ONDANSETRON HCL 4 MG PO TABS: 8.0000 mg | ORAL_TABLET | Freq: Two times a day (BID) | ORAL | Status: DC | PRN

## 2013-03-13 MED ORDER — SODIUM CHLORIDE 0.9 % IV SOLN
16.0000 mg | INTRAVENOUS | Status: DC
  Filled 2013-03-13: qty 8

## 2013-03-13 MED ORDER — SODIUM CHLORIDE 0.9 % IV SOLN
1.5000 g/m2 | Freq: Once | INTRAVENOUS | Status: DC
  Filled 2013-03-13: qty 114

## 2013-03-13 MED ORDER — ACETAMINOPHEN 325 MG PO TABS
650.0000 mg | ORAL_TABLET | Freq: Once | ORAL | Status: AC
  Administered 2013-03-13: 650 mg via ORAL
  Filled 2013-03-13: qty 2

## 2013-03-13 MED ORDER — VINCRISTINE SULFATE CHEMO INJECTION 1 MG/ML
2.0000 mg | Freq: Once | INTRAVENOUS | Status: AC
  Administered 2013-03-13: 2 mg via INTRAVENOUS
  Filled 2013-03-13 (×2): qty 2

## 2013-03-13 MED ORDER — PROCHLORPERAZINE MALEATE 10 MG PO TABS
10.0000 mg | ORAL_TABLET | Freq: Four times a day (QID) | ORAL | Status: DC | PRN
  Administered 2013-03-13 – 2013-03-15 (×3): 10 mg via ORAL
  Filled 2013-03-13 (×4): qty 1

## 2013-03-13 MED ORDER — SODIUM CHLORIDE 0.9 % IV SOLN
INTRAVENOUS | Status: DC
  Administered 2013-03-13: 16:00:00 via INTRAVENOUS
  Administered 2013-03-14: 250 mL via INTRAVENOUS

## 2013-03-13 MED ORDER — ONDANSETRON HCL 8 MG PO TABS: 8.0000 mg | ORAL_TABLET | Freq: Two times a day (BID) | ORAL | Status: DC | PRN

## 2013-03-13 MED ORDER — LEUCOVORIN CALCIUM INJECTION 100 MG: 15.0000 mg/m2 | Freq: Four times a day (QID) | INTRAMUSCULAR | Status: DC

## 2013-03-13 MED ORDER — DEXAMETHASONE SODIUM PHOSPHATE 10 MG/ML IJ SOLN
40.0000 mg | INTRAMUSCULAR | Status: DC
  Administered 2013-03-13 – 2013-03-16 (×3): 40 mg via INTRAVENOUS
  Filled 2013-03-13 (×5): qty 4

## 2013-03-13 MED ORDER — DIPHENHYDRAMINE HCL 50 MG/ML IJ SOLN
25.0000 mg | Freq: Once | INTRAMUSCULAR | Status: AC
  Administered 2013-03-13: 25 mg via INTRAVENOUS
  Filled 2013-03-13: qty 1

## 2013-03-13 MED ORDER — HEPARIN SOD (PORK) LOCK FLUSH 100 UNIT/ML IV SOLN: 250.0000 [IU] | Freq: Once | INTRAVENOUS | Status: AC | PRN

## 2013-03-13 MED ORDER — HEPARIN SOD (PORK) LOCK FLUSH 100 UNIT/ML IV SOLN: 500.0000 [IU] | Freq: Once | INTRAVENOUS | Status: AC | PRN

## 2013-03-13 MED ORDER — SODIUM CHLORIDE 0.9 % IJ SOLN: 10.0000 mL | INTRAMUSCULAR | Status: DC | PRN

## 2013-03-13 MED ORDER — SODIUM CHLORIDE 0.9 % IV SOLN
500.0000 mg/m2 | Freq: Once | INTRAVENOUS | Status: AC
  Administered 2013-03-13: 1000 mg via INTRAVENOUS
  Filled 2013-03-13 (×2): qty 100

## 2013-03-13 MED ORDER — POTASSIUM CHLORIDE 10 MEQ/50ML IV SOLN
10.0000 meq | INTRAVENOUS | Status: AC
  Administered 2013-03-13 (×4): 10 meq via INTRAVENOUS
  Filled 2013-03-13 (×4): qty 50

## 2013-03-13 MED ORDER — LORAZEPAM 0.5 MG PO TABS: 0.5000 mg | ORAL_TABLET | Freq: Four times a day (QID) | ORAL | Status: DC | PRN

## 2013-03-13 MED ORDER — TAMSULOSIN HCL 0.4 MG PO CAPS
0.4000 mg | ORAL_CAPSULE | Freq: Every day | ORAL | Status: DC
  Administered 2013-03-13 – 2013-03-22 (×9): 0.4 mg via ORAL
  Filled 2013-03-13 (×11): qty 1

## 2013-03-13 MED ORDER — ARTIFICIAL TEARS OP OINT
TOPICAL_OINTMENT | Freq: Three times a day (TID) | OPHTHALMIC | Status: DC
  Administered 2013-03-13: 1 via OPHTHALMIC
  Administered 2013-03-13 – 2013-03-21 (×13): via OPHTHALMIC
  Filled 2013-03-13 (×3): qty 3.5

## 2013-03-13 MED ORDER — ALTEPLASE 2 MG IJ SOLR
2.0000 mg | Freq: Once | INTRAMUSCULAR | Status: AC | PRN
  Filled 2013-03-13: qty 2

## 2013-03-13 MED ORDER — SODIUM CHLORIDE 0.9 % IJ SOLN: 3.0000 mL | INTRAMUSCULAR | Status: DC | PRN

## 2013-03-13 NOTE — Progress Notes (Signed)
Pt 's wife notified RN that pt complained of having trouble starting to urinate, was able to urinate x 2 this night but very small and was unable to catch in hat to measure. Rn ordered for a bladder  Scan but pt was able to urinate at that point and states he does not feel that fullness anymore. Will leave scan by pt bedside and advised him to call if he feels full again and unable to urinate and i will scan him and make MD aware. Will monitor closely.

## 2013-03-13 NOTE — Progress Notes (Signed)
Pt was successful in urinating had 250cc clear yellow urine. States he feels comfortable now. Bladder scan him had 314cc. Will notify md in morning about trouble starting urine and would keep strict intake and output.

## 2013-03-13 NOTE — Progress Notes (Signed)
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  CURRENT THERAPY:  Observation.  INTERIM HISTORY:  Mr. Schaner unfortunately now has CNS involvement by his lymphoma.  He had this left foot drop.  We did an MRI.  MRI of the spine and brain did not show significant at first.  We then went ahead and did a spinal tap on him.  This was done on January 16th.  The pathology report (NZB [id number]) showed non-Hodgkin B- cell lymphoma.  Flow cytometry confirmed a monoclonal population of cells.  He now has a right Bell's palsy.  Again, this all goes along with his lymphoma.  I suspect that he probably has systemic disease.  He was set up for a PET scan to be done next week.  He had radiation therapy to the left hip because of bony pain.  His blood sugars have been doing fairly well.  His appetite has been okay.  He has had pain issues.  He is on fentanyl patch and oxycodone for this.  He has had no fever.  There is no bowel or bladder incontinence.  PHYSICAL EXAMINATION:  General:  This is a fairly well-developed, well- nourished white gentleman, in no obvious distress.  Vital Signs: Temperature of 97, pulse 81, respiratory 18, blood pressure 108/65, weight is 161 pounds.  Head and Neck:  The right facial nerve palsy.  He has no oral lesions.  He has good gag reflex.  No adenopathy noted in the neck.  Lungs:  Clear bilaterally.  Cardiac:  Regular rate and rhythm with no murmurs, rubs, or bruits.  Abdomen:  Soft.  He has good bowel sounds.  There is no fluid wave.  There is no palpable abdominal mass. There is no palpable hepatosplenomegaly.  Back:  No tenderness over the spine, ribs, or hips.  Extremities:  Left foot drop.  He has decent strength in his extremities, otherwise.  Skin:  No rashes, ecchymosis, or petechia.  Labs were not done this visit.  IMPRESSION:  Mr. Grudzien is a 68 year old gentleman.  He  presented with diffuse large cell non-Hodgkin lymphoma.  This was back in July.  He had a fairly high risk disease at the time.  He had a good response to chemotherapy.  Unfortunately, I think we now looking at progression of his disease.  I had a long talk with he and his wife.  His sister also came in.  I spent about an hour with him.  This certainly is a serious problem.  I told him that we could treat this, but that we were not going to cure this.  We should be able to get the lymphoma burden down from the spinal fluid.  However, it typically will come back.  He would like to think about the treatment.  This certainly would be reasonable.  I think if we were to treat him, I will give high-dose methotrexate based on chemotherapy.  Also would consider intrathecal chemotherapy.  I talked to him about the possibility and Ommaya if he would like.  Again, he is going to think about this.  I just feel bad for Mr. Glenford Peers.  He has done so much.  He has tried so hard.  He will let us know what he wishes to do.  Followup in the office will be based upon on his decisions about any therapy.    ______________________________ Volanda Napoleon, M.D. PRE/MEDQ  D:  03/12/2013  T:  03/13/2013  Job:  [id number]

## 2013-03-13 NOTE — Progress Notes (Signed)
Pt  Continue to have very little output and felt uncomfortable, RN had an order to in and out cath pt. Had 400 cc output when in and ou cath. Pt staes he  feels much comfortable now.

## 2013-03-13 NOTE — Progress Notes (Signed)
[name] is having issues with some urinary retention. He had a hard time going to the bathroom this morning. He is never had a problem with this. He had in and out catheter with 400 cc obtained. I will start him on some Flomax. I do not see any obvious medication that he is on that could do this. As such, it is worried about this being part of his CNS lymphoma relapse.  His potassium is a little low today. We'll give him IV potassium.  His blood sugars are okay. He's had no issues with this so 4.  There's been no cough. He's had no sweats. He has the right facial nerve palsy. This is about the same.  On his physical exam, vital signs show temperature of 97 6. Pulse 88. Blood pressure 147/84. In exam shows the right facial nerve palsy. He has no oral lesions. Neck is supple with no adenopathy. Lungs are clear bilaterally. Cardiac exam regular rate and rhythm with a normal S1 and S2. There are no murmurs rubs or bruits. Abdomen is soft. He has good bowel sounds. There is no fluid wave. There is no palpable pedal split no megaly back exam no tenderness. Extremities shows the left foot drop. He has decent strength in his legs. He has good range of motion of his joints. Neurological exam shows the right cranial nerve 7 palsy in the left foot drop.  His labs show a potassium of 3. BUN 20 creatinine 0.91.  He we'll start chemotherapy today for the non-Hodgkin's lymphoma relapse. He'll get Rituxan. He may also get the thiotepa and vincristine.  We are checking his urine pH. We will hopefully give methotrexate on Sunday. I need to make sure his urine pH is above 7.5.  Hopefully, the Flomax will help with his urinary issue. If not, he probably will need a Foley catheter.  I will see about some ointment or eyedrops for her right eye.  I appreciate the great care that he is getting from the staff on [place].!!  Keith Duffy.  1 Keith Duffy 3:8

## 2013-03-13 NOTE — Progress Notes (Signed)
Patient completed chemotherapy with out difficulty.

## 2013-03-14 DIAGNOSIS — D6481 Anemia due to antineoplastic chemotherapy: Secondary | ICD-10-CM

## 2013-03-14 DIAGNOSIS — E871 Hypo-osmolality and hyponatremia: Secondary | ICD-10-CM

## 2013-03-14 DIAGNOSIS — T451X5A Adverse effect of antineoplastic and immunosuppressive drugs, initial encounter: Secondary | ICD-10-CM

## 2013-03-14 DIAGNOSIS — C8589 Other specified types of non-Hodgkin lymphoma, extranodal and solid organ sites: Secondary | ICD-10-CM

## 2013-03-14 DIAGNOSIS — R7309 Other abnormal glucose: Secondary | ICD-10-CM

## 2013-03-14 LAB — URINALYSIS, ROUTINE W REFLEX MICROSCOPIC
BILIRUBIN URINE: NEGATIVE
Glucose, UA: >1000 mg/dL — AB
HGB URINE DIPSTICK: NEGATIVE
KETONES UR: NEGATIVE mg/dL
Leukocytes, UA: NEGATIVE
Nitrite: NEGATIVE
Protein, ur: NEGATIVE mg/dL
Specific Gravity, Urine: 1.025 (ref 1.005–1.030)
UROBILINOGEN UA: 0.2 mg/dL (ref 0.0–1.0)
pH: 6 (ref 5.0–8.0)

## 2013-03-14 LAB — URINALYSIS, DIPSTICK ONLY
BILIRUBIN URINE: NEGATIVE
BILIRUBIN URINE: NEGATIVE
BILIRUBIN URINE: NEGATIVE
Bilirubin Urine: NEGATIVE
Bilirubin Urine: NEGATIVE
Glucose, UA: >1000 mg/dL — AB
Glucose, UA: >1000 mg/dL — AB
Glucose, UA: >1000 mg/dL — AB
Glucose, UA: >1000 mg/dL — AB
HGB URINE DIPSTICK: NEGATIVE
HGB URINE DIPSTICK: NEGATIVE
Hgb urine dipstick: NEGATIVE
Hgb urine dipstick: NEGATIVE
Hgb urine dipstick: NEGATIVE
KETONES UR: NEGATIVE mg/dL
Ketones, ur: NEGATIVE mg/dL
Ketones, ur: NEGATIVE mg/dL
Ketones, ur: NEGATIVE mg/dL
Ketones, ur: NEGATIVE mg/dL
LEUKOCYTES UA: NEGATIVE
LEUKOCYTES UA: NEGATIVE
LEUKOCYTES UA: NEGATIVE
Leukocytes, UA: NEGATIVE
Leukocytes, UA: NEGATIVE
NITRITE: NEGATIVE
NITRITE: NEGATIVE
NITRITE: NEGATIVE
Nitrite: NEGATIVE
Nitrite: NEGATIVE
PH: 6.5 (ref 5.0–8.0)
PH: 6.5 (ref 5.0–8.0)
PH: 6.5 (ref 5.0–8.0)
PROTEIN: NEGATIVE mg/dL
Protein, ur: NEGATIVE mg/dL
Protein, ur: NEGATIVE mg/dL
Protein, ur: NEGATIVE mg/dL
Protein, ur: NEGATIVE mg/dL
SPECIFIC GRAVITY, URINE: 1.02 (ref 1.005–1.030)
SPECIFIC GRAVITY, URINE: 1.023 (ref 1.005–1.030)
SPECIFIC GRAVITY, URINE: 1.025 (ref 1.005–1.030)
Specific Gravity, Urine: 1.028 (ref 1.005–1.030)
Specific Gravity, Urine: 1.027 (ref 1.005–1.030)
UROBILINOGEN UA: 0.2 mg/dL (ref 0.0–1.0)
UROBILINOGEN UA: 0.2 mg/dL (ref 0.0–1.0)
Urobilinogen, UA: 1 mg/dL (ref 0.0–1.0)
Urobilinogen, UA: 0.2 mg/dL (ref 0.0–1.0)
Urobilinogen, UA: 0.2 mg/dL (ref 0.0–1.0)
pH: 6.5 (ref 5.0–8.0)
pH: 7 (ref 5.0–8.0)

## 2013-03-14 LAB — CBC
HCT: 31.4 % — ABNORMAL LOW (ref 39.0–52.0)
Hemoglobin: 10.9 g/dL — ABNORMAL LOW (ref 13.0–17.0)
MCH: 32.2 pg (ref 26.0–34.0)
MCHC: 34.7 g/dL (ref 30.0–36.0)
MCV: 92.6 fL (ref 78.0–100.0)
Platelets: 150 10*3/uL (ref 150–400)
RBC: 3.39 MIL/uL — ABNORMAL LOW (ref 4.22–5.81)
RDW: 15 % (ref 11.5–15.5)
WBC: 4.9 10*3/uL (ref 4.0–10.5)

## 2013-03-14 LAB — GLUCOSE, CAPILLARY
GLUCOSE-CAPILLARY: 147 mg/dL — AB (ref 70–99)
GLUCOSE-CAPILLARY: 190 mg/dL — AB (ref 70–99)
Glucose-Capillary: 166 mg/dL — ABNORMAL HIGH (ref 70–99)
Glucose-Capillary: 213 mg/dL — ABNORMAL HIGH (ref 70–99)
Glucose-Capillary: 186 mg/dL — ABNORMAL HIGH (ref 70–99)

## 2013-03-14 LAB — URINE MICROSCOPIC-ADD ON

## 2013-03-14 LAB — BASIC METABOLIC PANEL
BUN: 21 mg/dL (ref 6–23)
CALCIUM: 8.5 mg/dL (ref 8.4–10.5)
CO2: 24 mEq/L (ref 19–32)
CREATININE: 0.86 mg/dL (ref 0.50–1.35)
Chloride: 99 mEq/L (ref 96–112)
GFR calc Af Amer: >90 mL/min (ref >90)
GFR, EST NON AFRICAN AMERICAN: 89 mL/min — AB (ref >90)
GLUCOSE: 153 mg/dL — AB (ref 70–99)
Potassium: 4.2 mEq/L (ref 3.7–5.3)
SODIUM: 136 meq/L — AB (ref 137–147)

## 2013-03-14 MED ORDER — ENOXAPARIN SODIUM 40 MG/0.4ML ~~LOC~~ SOLN
40.0000 mg | SUBCUTANEOUS | Status: AC
  Administered 2013-03-14: 40 mg via SUBCUTANEOUS
  Filled 2013-03-14: qty 0.4

## 2013-03-14 MED ORDER — STERILE WATER FOR INJECTION IV SOLN
INTRAVENOUS | Status: AC
  Administered 2013-03-14 – 2013-03-17 (×7): via INTRAVENOUS
  Filled 2013-03-14 (×12): qty 9.7

## 2013-03-14 MED ORDER — CEFAZOLIN SODIUM-DEXTROSE 2-3 GM-% IV SOLR
2.0000 g | INTRAVENOUS | Status: AC
  Administered 2013-03-15: 2 g via INTRAVENOUS

## 2013-03-14 NOTE — Progress Notes (Signed)
Reynard Christoffersen   DOB:66/01/1946   HQ#:[id number]   UXL#:[id number]  Subjective: I spoke with nurse Caren Griffins.  urinary pH was 6 this.   Discussed with pharmacy, increasing bicarb and repeat every 4 hours. No acute overnight events noted. Patient report nausea yesterday and one episode of vomiting.  He was given anti-nausea meds this am.  Wife at bedside.   Objective:  Filed Vitals:   03/14/13 0521  BP: 131/58  Pulse: 68  Temp: 98.5 F (36.9 C)  Resp: 16    Body mass index is 23.98 kg/(m^2).  Intake/Output Summary (Last 24 hours) at 03/14/13 0834 Last data filed at 03/14/13 0700  Gross per 24 hour  Intake 5024.66 ml  Output   1850 ml  Net 3174.66 ml    Laying in bed.   Sclerae unicteric  Oropharynx clear  Lungs clear -- no rales or rhonchi  Heart regular rate and rhythm  Abdomen benign +BS  MSK  no peripheral edema  Neuro, R facial nerve palsy noted  CBG (last 3)   Recent Labs  03/13/13 1707 03/13/13 2148 03/14/13 0731  GLUCAP 149* 187* 147*   Labs:  Lab Results  Component Value Date   WBC 4.9 03/14/2013   HGB 10.9* 03/14/2013   HCT 31.4* 03/14/2013   MCV 92.6 03/14/2013   PLT 150 03/14/2013   NEUTROABS 9.3* 5/36/6440    Basic Metabolic Panel:  Recent Labs Lab 03/12/13 1515 03/13/13 0700 03/14/13 0515  NA 136* 135* 136*  K 3.7 3.0* 4.2  CL 97 97 99  CO2 26 22 24   GLUCOSE 148* 111* 153*  BUN 21 20 21   CREATININE 0.81 0.91 0.86  CALCIUM 9.4 8.9 8.5  MG 2.1  --   --   PHOS 3.8  --   --    GFR Estimated Creatinine Clearance: 85.6 ml/min (by C-G formula based on Cr of 0.86). Liver Function Tests:  Recent Labs Lab 03/12/13 1515  AST 13  ALT 16  ALKPHOS 107  BILITOT 0.5  PROT 6.6  ALBUMIN 3.7    CBC:  Recent Labs Lab 03/12/13 1515 03/14/13 0515  WBC 11.5* 4.9  NEUTROABS 9.3*  --   HGB 12.4* 10.9*  HCT 35.2* 31.4*  MCV 92.4 92.6  PLT 199 150   CBG:  Recent Labs Lab 03/13/13 0736 03/13/13 1243 03/13/13 1707 03/13/13 2148  03/14/13 0731  GLUCAP 98 127* 149* 187* 147*    Results for RICHERD, GRIME (MRN [id number]) as of 03/14/2013 11:36  Ref. Range 03/14/2013 04:46  Color, Urine Latest Range: YELLOW  YELLOW  APPearance Latest Range: CLEAR  CLEAR  Specific Gravity, Urine Latest Range: 1.005-1.030  1.025  pH Latest Range: 5.0-8.0  6.0  Glucose Latest Range: NEGATIVE mg/dL >1000 (A)  Bilirubin Urine Latest Range: NEGATIVE  NEGATIVE  Ketones, ur Latest Range: NEGATIVE mg/dL NEGATIVE  Protein Latest Range: NEGATIVE mg/dL NEGATIVE  Urobilinogen, UA Latest Range: 0.0-1.0 mg/dL 0.2  Nitrite Latest Range: NEGATIVE  NEGATIVE  Leukocytes, UA Latest Range: NEGATIVE  NEGATIVE  Hgb urine dipstick Latest Range: NEGATIVE  NEGATIVE  RBC / HPF Latest Range: <3 RBC/hpf 0-2  Bacteria, UA Latest Range: RARE  RARE    Studies:  No results found.  Assessment: 68 y.o. w history as a noted below:  #1. Relapsed CNS NHL.  --Started R-MTV q 21 day yesterday. Completed without incdidence.  --Scheduled for MTX today.  Urine pH is greater than 7.5 prior to start.  Urinary pH this am 6.  Instructed to  increase bicarb and repeat every 4 hours.  --MTX levels daily following completion of methotrexate infusion until levels are less than 0.05 uM/L.   #2. Anemia secondary #1. --He is asymptomatic.   #3. Mild hyponatremia.  --Continue hydration.   #4. Elevated glucose. -- SSI, Lantus plus novolog.  Continue present care in the setting of #5.   #5. Nutrition. -- Prealbumin 8.1.  Nutrition consult.  Ensure as tolerated.   Armonie Staten, MD 03/14/2013  8:34 AM

## 2013-03-15 ENCOUNTER — Encounter (HOSPITAL_COMMUNITY): Payer: Medicare Other | Admitting: Anesthesiology

## 2013-03-15 ENCOUNTER — Inpatient Hospital Stay (HOSPITAL_COMMUNITY): Payer: Medicare Other | Admitting: Anesthesiology

## 2013-03-15 ENCOUNTER — Encounter (HOSPITAL_COMMUNITY)
Admission: RE | Disposition: A | Discharge status: Home / Self Care | Payer: Self-pay | Source: Ambulatory Visit | Attending: Hematology & Oncology

## 2013-03-15 ENCOUNTER — Encounter (HOSPITAL_COMMUNITY): Payer: Self-pay | Admitting: Anesthesiology

## 2013-03-15 ENCOUNTER — Inpatient Hospital Stay: Admit: 2013-03-15 | Payer: Medicare Other | Admitting: Neurosurgery

## 2013-03-15 DIAGNOSIS — C7949 Secondary malignant neoplasm of other parts of nervous system: Secondary | ICD-10-CM | POA: Diagnosis present

## 2013-03-15 DIAGNOSIS — C801 Malignant (primary) neoplasm, unspecified: Secondary | ICD-10-CM

## 2013-03-15 HISTORY — PX: CHEMO RESERVIOR INSERTION: SHX2099

## 2013-03-15 LAB — CBC
HEMATOCRIT: 29.8 % — AB (ref 39.0–52.0)
Hemoglobin: 10.5 g/dL — ABNORMAL LOW (ref 13.0–17.0)
MCH: 32.6 pg (ref 26.0–34.0)
MCHC: 35.2 g/dL (ref 30.0–36.0)
MCV: 92.5 fL (ref 78.0–100.0)
Platelets: 126 10*3/uL — ABNORMAL LOW (ref 150–400)
RBC: 3.22 MIL/uL — AB (ref 4.22–5.81)
RDW: 14.9 % (ref 11.5–15.5)
WBC: 6.8 10*3/uL (ref 4.0–10.5)

## 2013-03-15 LAB — BASIC METABOLIC PANEL
BUN: 19 mg/dL (ref 6–23)
CO2: 29 mEq/L (ref 19–32)
Calcium: 8.6 mg/dL (ref 8.4–10.5)
Chloride: 97 mEq/L (ref 96–112)
Creatinine, Ser: 0.78 mg/dL (ref 0.50–1.35)
GFR calc Af Amer: >90 mL/min (ref >90)
GFR calc non Af Amer: >90 mL/min (ref >90)
Glucose, Bld: 134 mg/dL — ABNORMAL HIGH (ref 70–99)
Potassium: 3.5 mEq/L — ABNORMAL LOW (ref 3.7–5.3)
Sodium: 137 mEq/L (ref 137–147)

## 2013-03-15 LAB — URINALYSIS, DIPSTICK ONLY
BILIRUBIN URINE: NEGATIVE
Bilirubin Urine: NEGATIVE
GLUCOSE, UA: 100 mg/dL — AB
Glucose, UA: 100 mg/dL — AB
HGB URINE DIPSTICK: NEGATIVE
Hgb urine dipstick: NEGATIVE
Ketones, ur: NEGATIVE mg/dL
Ketones, ur: NEGATIVE mg/dL
Leukocytes, UA: NEGATIVE
Leukocytes, UA: NEGATIVE
Nitrite: NEGATIVE
Nitrite: NEGATIVE
Protein, ur: NEGATIVE mg/dL
Protein, ur: NEGATIVE mg/dL
Specific Gravity, Urine: 1.012 (ref 1.005–1.030)
Specific Gravity, Urine: 1.011 (ref 1.005–1.030)
Urobilinogen, UA: 0.2 mg/dL (ref 0.0–1.0)
Urobilinogen, UA: 0.2 mg/dL (ref 0.0–1.0)
pH: 8 (ref 5.0–8.0)
pH: 8 (ref 5.0–8.0)

## 2013-03-15 LAB — GLUCOSE, CAPILLARY
GLUCOSE-CAPILLARY: 95 mg/dL (ref 70–99)
GLUCOSE-CAPILLARY: 107 mg/dL — AB (ref 70–99)
Glucose-Capillary: 119 mg/dL — ABNORMAL HIGH (ref 70–99)
Glucose-Capillary: 163 mg/dL — ABNORMAL HIGH (ref 70–99)

## 2013-03-15 SURGERY — CHEMO RESERVOIR INSERTION
Anesthesia: General | Site: Head

## 2013-03-15 MED ORDER — LACTATED RINGERS IV SOLN
INTRAVENOUS | Status: DC | PRN
  Administered 2013-03-15: 14:00:00 via INTRAVENOUS

## 2013-03-15 MED ORDER — PROMETHAZINE HCL 25 MG PO TABS: 12.5000 mg | ORAL_TABLET | ORAL | Status: DC | PRN

## 2013-03-15 MED ORDER — BUPIVACAINE-EPINEPHRINE (PF) 0.5% -1:200000 IJ SOLN
INTRAMUSCULAR | Status: DC | PRN
  Administered 2013-03-15: 5 mL

## 2013-03-15 MED ORDER — HYDROMORPHONE HCL PF 1 MG/ML IJ SOLN: 0.2500 mg | INTRAMUSCULAR | Status: DC | PRN

## 2013-03-15 MED ORDER — LABETALOL HCL 5 MG/ML IV SOLN
10.0000 mg | INTRAVENOUS | Status: DC | PRN
  Filled 2013-03-15: qty 8

## 2013-03-15 MED ORDER — FENTANYL CITRATE 0.05 MG/ML IJ SOLN
INTRAMUSCULAR | Status: DC | PRN
  Administered 2013-03-15: 100 ug via INTRAVENOUS
  Administered 2013-03-15 (×2): 50 ug via INTRAVENOUS

## 2013-03-15 MED ORDER — SUCCINYLCHOLINE CHLORIDE 20 MG/ML IJ SOLN
INTRAMUSCULAR | Status: DC | PRN
  Administered 2013-03-15: 100 mg via INTRAVENOUS

## 2013-03-15 MED ORDER — OXYCODONE HCL 5 MG PO TABS: 5.0000 mg | ORAL_TABLET | Freq: Once | ORAL | Status: DC | PRN

## 2013-03-15 MED ORDER — PROPOFOL 10 MG/ML IV BOLUS
INTRAVENOUS | Status: DC | PRN
  Administered 2013-03-15: 100 mg via INTRAVENOUS

## 2013-03-15 MED ORDER — EPHEDRINE SULFATE 50 MG/ML IJ SOLN
INTRAMUSCULAR | Status: DC | PRN
  Administered 2013-03-15 (×6): 5 mg via INTRAVENOUS

## 2013-03-15 MED ORDER — CEFAZOLIN SODIUM-DEXTROSE 2-3 GM-% IV SOLR
INTRAVENOUS | Status: AC
  Filled 2013-03-15: qty 50

## 2013-03-15 MED ORDER — BACITRACIN ZINC 500 UNIT/GM EX OINT
TOPICAL_OINTMENT | CUTANEOUS | Status: DC | PRN
  Administered 2013-03-15: 1 via TOPICAL

## 2013-03-15 MED ORDER — ROCURONIUM BROMIDE 50 MG/5ML IV SOLN
INTRAVENOUS | Status: AC
  Filled 2013-03-15: qty 1

## 2013-03-15 MED ORDER — 0.9 % SODIUM CHLORIDE (POUR BTL) OPTIME
TOPICAL | Status: DC | PRN
  Administered 2013-03-15: 1000 mL

## 2013-03-15 MED ORDER — ONDANSETRON HCL 4 MG/2ML IJ SOLN
INTRAMUSCULAR | Status: AC
  Filled 2013-03-15: qty 2

## 2013-03-15 MED ORDER — ONDANSETRON HCL 4 MG/2ML IJ SOLN
INTRAMUSCULAR | Status: DC | PRN
  Administered 2013-03-15: 4 mg via INTRAVENOUS

## 2013-03-15 MED ORDER — GLYCOPYRROLATE 0.2 MG/ML IJ SOLN
INTRAMUSCULAR | Status: AC
  Filled 2013-03-15: qty 2

## 2013-03-15 MED ORDER — ONDANSETRON HCL 4 MG/2ML IJ SOLN: 4.0000 mg | Freq: Once | INTRAMUSCULAR | Status: DC | PRN

## 2013-03-15 MED ORDER — MEPERIDINE HCL 25 MG/ML IJ SOLN: 6.2500 mg | INTRAMUSCULAR | Status: DC | PRN

## 2013-03-15 MED ORDER — DOCUSATE SODIUM 100 MG PO CAPS
100.0000 mg | ORAL_CAPSULE | Freq: Two times a day (BID) | ORAL | Status: DC
  Administered 2013-03-16 – 2013-03-22 (×9): 100 mg via ORAL
  Filled 2013-03-15 (×16): qty 1

## 2013-03-15 MED ORDER — ACETAMINOPHEN 325 MG PO TABS
650.0000 mg | ORAL_TABLET | ORAL | Status: DC | PRN
  Administered 2013-03-16: 650 mg via ORAL
  Filled 2013-03-15: qty 2

## 2013-03-15 MED ORDER — SUCCINYLCHOLINE CHLORIDE 20 MG/ML IJ SOLN
INTRAMUSCULAR | Status: AC
  Filled 2013-03-15: qty 1

## 2013-03-15 MED ORDER — SODIUM CHLORIDE 0.9 % IR SOLN
Status: DC | PRN
  Administered 2013-03-15: 15:00:00

## 2013-03-15 MED ORDER — MIDAZOLAM HCL 5 MG/5ML IJ SOLN
INTRAMUSCULAR | Status: DC | PRN
  Administered 2013-03-15: 2 mg via INTRAVENOUS

## 2013-03-15 MED ORDER — MIDAZOLAM HCL 2 MG/2ML IJ SOLN
INTRAMUSCULAR | Status: AC
  Filled 2013-03-15: qty 2

## 2013-03-15 MED ORDER — OXYCODONE HCL 5 MG/5ML PO SOLN: 5.0000 mg | Freq: Once | ORAL | Status: DC | PRN

## 2013-03-15 MED ORDER — MORPHINE SULFATE 2 MG/ML IJ SOLN: 1.0000 mg | INTRAMUSCULAR | Status: DC | PRN

## 2013-03-15 MED ORDER — ACETAMINOPHEN 650 MG RE SUPP
650.0000 mg | RECTAL | Status: DC | PRN
Start: 2013-03-15 — End: 2013-03-22

## 2013-03-15 MED ORDER — THROMBIN 5000 UNITS EX SOLR
CUTANEOUS | Status: DC | PRN
  Administered 2013-03-15 (×2): 5000 [IU] via TOPICAL

## 2013-03-15 MED ORDER — ONDANSETRON HCL 4 MG/2ML IJ SOLN
4.0000 mg | INTRAMUSCULAR | Status: DC | PRN
  Administered 2013-03-18 (×2): 4 mg via INTRAVENOUS
  Filled 2013-03-15 (×2): qty 2

## 2013-03-15 MED ORDER — LIDOCAINE HCL (CARDIAC) 20 MG/ML IV SOLN
INTRAVENOUS | Status: DC | PRN
  Administered 2013-03-15: 100 mg via INTRAVENOUS

## 2013-03-15 MED ORDER — HEMOSTATIC AGENTS (NO CHARGE) OPTIME
TOPICAL | Status: DC | PRN
  Administered 2013-03-15: 1 via TOPICAL

## 2013-03-15 MED ORDER — PANTOPRAZOLE SODIUM 40 MG IV SOLR: 40.0000 mg | Freq: Every day | INTRAVENOUS | Status: DC

## 2013-03-15 MED ORDER — ARTIFICIAL TEARS OP OINT
TOPICAL_OINTMENT | OPHTHALMIC | Status: AC
  Filled 2013-03-15: qty 3.5

## 2013-03-15 MED ORDER — CEFAZOLIN SODIUM-DEXTROSE 2-3 GM-% IV SOLR
2.0000 g | Freq: Three times a day (TID) | INTRAVENOUS | Status: AC
  Administered 2013-03-15 – 2013-03-16 (×2): 2 g via INTRAVENOUS
  Filled 2013-03-15 (×2): qty 50

## 2013-03-15 MED ORDER — HYDROCODONE-ACETAMINOPHEN 5-325 MG PO TABS: 1.0000 | ORAL_TABLET | ORAL | Status: DC | PRN

## 2013-03-15 MED ORDER — FENTANYL CITRATE 0.05 MG/ML IJ SOLN
INTRAMUSCULAR | Status: AC
  Filled 2013-03-15: qty 5

## 2013-03-15 MED ORDER — NEOSTIGMINE METHYLSULFATE 1 MG/ML IJ SOLN
INTRAMUSCULAR | Status: AC
  Filled 2013-03-15: qty 10

## 2013-03-15 MED ORDER — ARTIFICIAL TEARS OP OINT
TOPICAL_OINTMENT | OPHTHALMIC | Status: DC | PRN
  Administered 2013-03-15: 1 via OPHTHALMIC

## 2013-03-15 MED ORDER — PROPOFOL 10 MG/ML IV BOLUS
INTRAVENOUS | Status: AC
  Filled 2013-03-15: qty 20

## 2013-03-15 MED ORDER — OXYCODONE HCL 5 MG/5ML PO SOLN
5.0000 mg | Freq: Once | ORAL | Status: DC | PRN
Start: 2013-03-15 — End: 2013-03-15

## 2013-03-15 MED ORDER — ONDANSETRON HCL 4 MG PO TABS: 4.0000 mg | ORAL_TABLET | ORAL | Status: DC | PRN

## 2013-03-15 SURGICAL SUPPLY — 67 items
BAG DECANTER FOR FLEXI CONT (MISCELLANEOUS) ×3 IMPLANT
BANDAGE GAUZE 4  KLING STR (GAUZE/BANDAGES/DRESSINGS) IMPLANT
BANDAGE GAUZE ELAST BULKY 4 IN (GAUZE/BANDAGES/DRESSINGS) IMPLANT
BLADE SURG 10 STRL SS (BLADE) ×3 IMPLANT
BLADE SURG 15 STRL LF DISP TIS (BLADE) ×1 IMPLANT
BLADE SURG 15 STRL SS (BLADE) ×3
BLADE SURG ROTATE 9660 (MISCELLANEOUS) ×3 IMPLANT
BOOT SUTURE AID YELLOW STND (SUTURE) IMPLANT
BUR ACORN 6.0 PRECISION (BURR) ×2 IMPLANT
BUR ACORN 6.0MM PRECISION (BURR) ×1
CANISTER SUCT 3000ML (MISCELLANEOUS) ×3 IMPLANT
CLIP RANEY DISP (INSTRUMENTS) IMPLANT
CONT SPEC 4OZ CLIKSEAL STRL BL (MISCELLANEOUS) IMPLANT
DRAPE INCISE IOBAN 66X45 STRL (DRAPES) ×3 IMPLANT
DRAPE POUCH INSTRU U-SHP 10X18 (DRAPES) ×3 IMPLANT
DRAPE PROXIMA HALF (DRAPES) IMPLANT
DRESSING TELFA 8X3 (GAUZE/BANDAGES/DRESSINGS) ×1 IMPLANT
DRSG OPSITE 4X5.5 SM (GAUZE/BANDAGES/DRESSINGS) ×1 IMPLANT
ELECT CAUTERY BLADE 6.4 (BLADE) ×3 IMPLANT
ELECT REM PT RETURN 9FT ADLT (ELECTROSURGICAL) ×3
ELECTRODE REM PT RTRN 9FT ADLT (ELECTROSURGICAL) ×1 IMPLANT
GAUZE SPONGE 4X4 16PLY XRAY LF (GAUZE/BANDAGES/DRESSINGS) ×3 IMPLANT
GLOVE BIO SURGEON STRL SZ8.5 (GLOVE) ×3 IMPLANT
GLOVE BIOGEL PI IND STRL 7.0 (GLOVE) IMPLANT
GLOVE BIOGEL PI IND STRL 7.5 (GLOVE) IMPLANT
GLOVE BIOGEL PI INDICATOR 7.0 (GLOVE) ×2
GLOVE BIOGEL PI INDICATOR 7.5 (GLOVE) ×4
GLOVE ECLIPSE 7.5 STRL STRAW (GLOVE) ×4 IMPLANT
GLOVE EXAM NITRILE LRG STRL (GLOVE) IMPLANT
GLOVE EXAM NITRILE MD LF STRL (GLOVE) IMPLANT
GLOVE EXAM NITRILE XL STR (GLOVE) IMPLANT
GLOVE EXAM NITRILE XS STR PU (GLOVE) IMPLANT
GLOVE OPTIFIT SS 8.0 STRL (GLOVE) ×3 IMPLANT
GLOVE SURG SS PI 7.0 STRL IVOR (GLOVE) ×4 IMPLANT
GOWN BRE IMP SLV AUR LG STRL (GOWN DISPOSABLE) ×2 IMPLANT
GOWN BRE IMP SLV AUR XL STRL (GOWN DISPOSABLE) ×4 IMPLANT
GOWN STRL REIN 2XL LVL4 (GOWN DISPOSABLE) IMPLANT
HEMOSTAT SURGICEL 2X14 (HEMOSTASIS) IMPLANT
KIT BASIN OR (CUSTOM PROCEDURE TRAY) ×3 IMPLANT
KIT ROOM TURNOVER OR (KITS) ×3 IMPLANT
NDL HYPO 25X1 1.5 SAFETY (NEEDLE) ×1 IMPLANT
NEEDLE HYPO 25X1 1.5 SAFETY (NEEDLE) ×6 IMPLANT
NS IRRIG 1000ML POUR BTL (IV SOLUTION) ×3 IMPLANT
PACK EENT II TURBAN DRAPE (CUSTOM PROCEDURE TRAY) ×3 IMPLANT
PAD ARMBOARD 7.5X6 YLW CONV (MISCELLANEOUS) ×9 IMPLANT
PATTIES SURGICAL .5 X.5 (GAUZE/BANDAGES/DRESSINGS) ×2 IMPLANT
PENCIL BUTTON HOLSTER BLD 10FT (ELECTRODE) ×3 IMPLANT
RESERVOIR CSF FLAT BOTTOM OMMA ×2 IMPLANT
SPONGE GAUZE 4X4 12PLY (GAUZE/BANDAGES/DRESSINGS) ×2 IMPLANT
SPONGE SURGIFOAM ABS GEL 12-7 (HEMOSTASIS) ×2 IMPLANT
STAPLER SKIN PROX WIDE 3.9 (STAPLE) ×3 IMPLANT
SUT BONE WAX W31G (SUTURE) IMPLANT
SUT NURALON 4 0 TR CR/8 (SUTURE) IMPLANT
SUT SILK 0 TIES 10X30 (SUTURE) ×2 IMPLANT
SUT VIC AB 2-0 CP2 18 (SUTURE) ×3 IMPLANT
SUT VIC AB 2-0 CT1 27 (SUTURE) ×3
SUT VIC AB 2-0 CT1 27XBRD (SUTURE) IMPLANT
SYR BULB 3OZ (MISCELLANEOUS) ×3 IMPLANT
SYR CONTROL 10ML LL (SYRINGE) ×3 IMPLANT
SYRINGE 10CC LL (SYRINGE) ×2 IMPLANT
TAPE CLOTH SURG 4X10 WHT LF (GAUZE/BANDAGES/DRESSINGS) ×2 IMPLANT
TOWEL OR 17X24 6PK STRL BLUE (TOWEL DISPOSABLE) ×3 IMPLANT
TOWEL OR 17X26 10 PK STRL BLUE (TOWEL DISPOSABLE) ×3 IMPLANT
TRAY FOLEY CATH 14FRSI W/METER (CATHETERS) IMPLANT
TUBE CONNECTING 12'X1/4 (SUCTIONS) ×2
TUBE CONNECTING 12X1/4 (SUCTIONS) ×3 IMPLANT
WATER STERILE IRR 1000ML POUR (IV SOLUTION) ×3 IMPLANT

## 2013-03-15 NOTE — Progress Notes (Signed)
Patient to be transferred to [facility] today for Ommaya reservoir placement by Dr. Arnoldo Morale.  [facility] notified to transfer patient.  Patient has bed at [facility] on [facility].  Report called to [place], RN.  Patient and his wife made aware of the transfer.  Patient stable for transfer.  Keith Duffy [facility]  03/15/2013  8:09 AM

## 2013-03-15 NOTE — H&P (Signed)
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    HPI: Keith Duffy is an 68 y.o. male with a history of  Diffuse large cell non-Hodgkin lymphoma (IPI score equal 5).The patient is status post 7 cycles of R-CHOP as well as  Palliative radiation therapy to the left hip. He is on  Neulasta for chemotherapy-induced neutropenia Status was complicated by CNS involvement. Per chart notes, after patient presented with left foot drop and Bell's Palsy  at the end of January MRI of the spine and brain did not show significant at first.Spinal tap on 1/16 revealed non-Hodgkin B- cell lymphoma. Flow cytometry confirmed a monoclonal population of cells.(NZB 15-36). Systemic disease was suspected. PET scan is scheduled for 1/28.  He was transferred to [facility] today for Ommaya reservoir placement by Dr. Arnoldo Morale. His urine pH I got to 8 today so no MTX was received.He is to return to [facility] on 1/27      PMH:  Past Medical History  Diagnosis Date  . Back pain   . Hypertension   . Diabetes mellitus without complication   . Cancer     non hodkins lymphoma    Surgeries:  History reviewed. No pertinent past surgical history.  Allergies:  Allergies  Allergen Reactions  . Aspartame And Phenylalanine Diarrhea  . Codeine Other (See Comments)    "Made me go wild when I was young"    Medications:   Prior to Admission:  Prescriptions prior to admission  Medication Sig Dispense Refill  . acetaminophen (TYLENOL) 500 MG tablet Take 1,000 mg by mouth daily as needed (pain).       . famciclovir (FAMVIR) 250 MG tablet Take 250 mg by mouth daily. To prevent shingles      . fentaNYL (DURAGESIC - DOSED MCG/HR) 12 MCG/HR Place 1 patch (12.5 mcg total) onto the skin every other day.  15 patch  0  . insulin aspart (NOVOLOG FLEXPEN)  100 UNIT/ML SOPN FlexPen Inject 0-9 Units into the skin 3 (three) times daily with meals. CBG < 70: eat or drink something sweet & recheck blood sugar. CBG 70 - 120: 0 units  CBG 121 - 150: 1 unit  CBG 151 - 200: 2 units  CBG 201 - 250: 3 units  CBG 251 - 300: 5 units  CBG 301 - 350: 7 units  CBG 351 - 400: 9 units  CBG > 400: call MD  5 pen  0  . Insulin Glargine (LANTUS SOLOSTAR) 100 UNIT/ML Solostar Pen Inject 25 Units into the skin at bedtime.      Marland Kitchen ketoconazole (NIZORAL) 200 MG tablet Take 200 mg by mouth daily.      . meloxicam (MOBIC) 15 MG tablet Take 1 tablet (15 mg total) by mouth every morning.  30 tablet  6  . metoprolol tartrate (LOPRESSOR) 25 MG tablet Take 25 mg by mouth 2 (two) times daily. 1 tablet every morning and 1 tablet at lunch      . Oxycodone HCl 10 MG TABS Take 10-20 mg by mouth every 4 (four) hours as needed (breakthrough pain).      . predniSONE (DELTASONE) 20 MG tablet Take 10-60 mg by mouth daily with breakfast. Tapered course started 03/08/13:  Take 3 tablets (60 mg) for 5 days, then take 2 1/2 (50 mg) tablets on 6th  day, then take 2 tablets (40 mg) on 7th day, then take 1 1/2 tablets (30 mg) on 8th day, then take 1 tablet (20 mg) on 9th day, then take 1/2 tablet (10 mg) on 10th day, then stop        [id number] (DILAUDID) injection, [MAR HOLD]  HYDROmorphone (DILAUDID) injection, [MAR HOLD] LORazepam, meperidine (DEMEROL) injection, ondansetron (ZOFRAN) IV, [MAR HOLD] ondansetron, [MAR HOLD] oxyCODONE, oxyCODONE, oxyCODONE, [MAR HOLD] prochlorperazine, [MAR HOLD] sodium chloride, [MAR HOLD] sodium chloride  ROS: per Dr. Marin Olp: mild nausea.  Still has the right facial nerve palsy. Is urinating well No Fever .His blood sugars have been a little on the high side but I'm not too worried about this. He's had no cough. He's had no rashes. He's tolerated treatment okay so far.  Foot drop still present   Physical Exam : as per Dr. Marin Olp: On his physical exam  this is a fairly well-developed well-nourished white gentleman in no obvious distress. His vital signs show a temperature of 98.5. Pulse 66 and blood pressure 143/66. Head exam shows no scleral icterus. He has the right cranial nerve 7 palsy. There is no oral lesions. He has no adenopathy in his neck. Lungs are clear bilaterally. Cardiac exam regular rate and rhythm with no murmurs rubs or bruits. Abdomen is soft. He has good bowel sounds. There is no fluid wave. There is no palpable pedal spinal megaly extremities shows the left foot drop. He has some trace edema in his legs. Skin exam no rashes   Family History:  Family History  Problem Relation Age of Onset  . Heart attack Father   . Heart attack Maternal Grandfather     Social History:  reports that he has never smoked. He has never used smokeless tobacco. He reports that he does not drink alcohol or use illicit drugs.  Physical Exam:   Filed Vitals:   03/15/13 1200  BP: 161/79  Pulse: 75  Temp:   Resp:     68 y.o. male  in no acute distress A. and O. x3 General well-developed, ill appearing  HEENT: Normocephalic, atraumatic, PERRLA. Oral cavity without thrush or lesions. Neck supple. no thyromegaly, no cervical or supraclavicular adenopathy  Lungs clear bilaterally . No wheezing, rhonchi or rales. Breasts: not examined. Cardiac regular rate and rhythm, no murmur , rubs or gallops Abdomen soft nontender , bowel sounds x4. No organomegaly GU/rectal: deferred. Extremities no clubbing cyanosis or edema. No bruising or petechial rash Neuro: non focal    LABS: CBC   Recent Labs Lab 03/12/13 1515 03/14/13 0515 03/15/13 0450  WBC 11.5* 4.9 6.8  HGB 12.4* 10.9* 10.5*  HCT 35.2* 31.4* 29.8*  PLT 199 150 126*  MCV 92.4 92.6 92.5  MCH 32.5 32.2 32.6  MCHC 35.2 34.7 35.2  RDW 15.1 15.0 14.9  LYMPHSABS 1.4  --   --   MONOABS 0.8  --   --   EOSABS 0.0  --   --   BASOSABS 0.0  --   --        CMP    Recent  Labs Lab 03/12/13 1515 03/13/13 0700 03/14/13 0515 03/15/13 0450  NA 136* 135* 136* 137  K 3.7 3.0* 4.2 3.5*  CL 97 97 99 97  CO2 26 22 24 29   GLUCOSE 148* 111* 153* 134*  BUN 21 20 21 19   CREATININE 0.81 0.91 0.86 0.78  CALCIUM 9.4 8.9 8.5 8.6  MG 2.1  --   --   --  AST 13  --   --   --   ALT 16  --   --   --   ALKPHOS 107  --   --   --   BILITOT 0.5  --   --   --         Component Value Date/Time   BILITOT 0.5 03/12/2013 1515   BILITOT 0.60 02/08/2013 0751      No results found for this basename: INR, PROTIME,  in the last 168 hours  No results found for this basename: DDIMER,  in the last 72 hours    Imaging Studies: No results found.      A/P: 68 y.o. male with a diagnosis of NHL, now with CNS involvement. He has been admitted for Ommaya reservoir placement by Dr. Arnoldo Morale. His urine pH I got to 8 today so no MTX was received.He is to return to [facility] on 1/27 under Oncology Service  Dr. Marin Olp Is to see the patient following this admission note and an addendum is to be written.   Sharene Butters E 03/15/2013 1:18 PM    WERTMAN,SARA E 03/15/2013 1:18 PM   ADDENDUM:  He is admitted for chemotherapy with R-MTV along with intrathecal chemo.  He will need an Ommaya resevoir placed. NSurg will help with this.  He has a port that we will use for chemo.  While in the hospital, we will do bone marrow bx and also try for a PET scan.  His IDDM is an issue -- will aggressively treat this.  He and his wife are so nice.  Theyu realize that we can treat this but that cure will be impossible.  He has a RIGHT CN VII palsy and LEFT foot drop -- this is due to CNS involvment by NHL.  I examined the patient and agree with the above assessment by Clarise Cruz.  [name]

## 2013-03-15 NOTE — Anesthesia Postprocedure Evaluation (Signed)
Anesthesia Post Note  Patient: Keith Duffy  Procedure(s) Performed: Procedure(s) (LRB): CHEMO RESERVOIR INSERTION (N/A)  Anesthesia type: general  Patient location: PACU  Post pain: Pain level controlled  Post assessment: Patient's Cardiovascular Status Stable  Last Vitals:  Filed Vitals:   03/15/13 1600  BP: 135/64  Pulse: 103  Temp: 36.4 C  Resp: 7    Post vital signs: Reviewed and stable  Level of consciousness: sedated  Complications: No apparent anesthesia complications

## 2013-03-15 NOTE — Progress Notes (Signed)
C/o feeling nauseated and "feeling bad" after voiding. Denies dizziness nor pain. Check CBG 119. Patient NPO for surgery today. Given compazine with a few sips of water.

## 2013-03-15 NOTE — Preoperative (Signed)
Beta Blockers   Reason not to administer Beta Blockers:Not Applicable 

## 2013-03-15 NOTE — Progress Notes (Signed)
Mr. Gladney still has not had his methotrexate. His urine pH I got to 8 today. Apparently he is going to [facility] for an Ommaya reservoir. We'll get this put him today. He'll then come back tomorrow and have the methotrexate given.  He's had a little nausea.. Still has the right facial nerve palsy.  Is urinating well. There's been no problems with this. We did go ahead and give him Flomax.  He has had no problems with temperature.  His blood sugars have been a little on the high side but I'm not too worried about this.  He's had no cough. He's had no rashes. He's tolerated treatment okay so far.  On his physical exam this is a fairly well-developed well-nourished white gentleman in no obvious distress. His vital signs show a temperature of 98.5. Pulse 66 and blood pressure 143/66. Head exam shows no scleral icterus. He has the right cranial nerve 7 palsy. There is no oral lesions. He has no adenopathy in his neck. Lungs are clear bilaterally. Cardiac exam regular rate and rhythm with no murmurs rubs or bruits. Abdomen is soft. He has good bowel sounds. There is no fluid wave. There is no palpable pedal spinal megaly extremities shows the left foot drop. He has some trace edema in his legs. Skin exam no rashes.  On his labs, white cell count 6.8. Hemoglobin 10.5 and platelet count of 126. Her sodium is 137. Potassium 3.5.  We will go ahead and get him over to [facility]. Dr. Arnoldo Morale will take care of him over there and then he will come back tomorrow.  We must maintain his IV fluids with the sodium bicarbonate. He was keep his urine output 1 so that we can administer the methotrexate.  Keith E.

## 2013-03-15 NOTE — Progress Notes (Signed)
Patient ID: Keith Duffy, male   DOB: Apr 27, 1945, 68 y.o.   MRN: [id number] Subjective:  The patient is alert and pleasant. He is in no apparent distress.  Objective: Vital signs in last 24 hours: Temp:  [97.8 F (36.6 C)-98.5 F (36.9 C)] 97.8 F (36.6 C) (01/26 1530) Pulse Rate:  [63-106] 106 (01/26 1530) Resp:  [7-16] 7 (01/26 1000) BP: (143-167)/(66-89) 155/80 mmHg (01/26 1530) SpO2:  [92 %-100 %] 100 % (01/26 1530) Weight:  [74.5 kg (164 lb 3.9 oz)] 74.5 kg (164 lb 3.9 oz) (01/26 0458)  Intake/Output from previous day: 01/25 0701 - 01/26 0700 In: 3148.7 [P.O.:1180; I.V.:1968.7] Out: 3710 [Urine:3330] Intake/Output this shift: Total I/O In: 1431.7 [P.O.:30; I.V.:1401.7] Out: 1420 [Urine:1400; Blood:20]  Physical exam the patient is alert and pleasant. He is moving all 4 extremities. He continues to have a right peripheral facial nerve palsy. His dressing has a small bloodstain.  Lab Results:  Recent Labs  03/14/13 0515 03/15/13 0450  WBC 4.9 6.8  HGB 10.9* 10.5*  HCT 31.4* 29.8*  PLT 150 126*   BMET  Recent Labs  03/14/13 0515 03/15/13 0450  NA 136* 137  K 4.2 3.5*  CL 99 97  CO2 24 29  GLUCOSE 153* 134*  BUN 21 19  CREATININE 0.86 0.78  CALCIUM 8.5 8.6    Studies/Results: No results found.  Assessment/Plan: The patient is doing well. He will likely go back to [facility] tomorrow.  LOS: 3 days     Savoy Somerville D 03/15/2013, 3:39 PM

## 2013-03-15 NOTE — Op Note (Signed)
Brief history: The patient is a 68 year old white male who's been diagnosed with lymphoma. 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 I have been asked to place a Ommaya reservoir for CSF access for intrathecal chemotherapy. I discussed the procedure with the patient as well as the risks, benefits, alternatives, and likelihood of achieving our goals with surgery. I advanced all the patient, and his wife's, questions. He has decided proceed with surgery.  Preop diagnosis: Lymphoma, carcinomatous meningitis  Postop diagnosis: The same  Procedure: Placement of right frontal Ommaya reservoir via burr hole  Surgeon: Dr. Earle Gell  Asst.: None  Anesthesia: Gen. endotracheal  Specimens: None  Drains: None  Complications: None  Description of procedure: The patient was brought to the operating room by the anesthesia team. General endotracheal anesthesia was induced. The patient remained in the supine position. His head was supported on the donut. The patient's right frontal scalp was then shaved with clippers and prepared with Betadine scrub and Betadine solution. Sterile drapes were applied. I then injected the area to be incised with Marcaine with epinephrine solution. I used the scalpel to create a curved incision centered in the mid pupillary line at approximately the coronal suture on the right. I used the cerebellar retractor for exposure. I then used the high-speed drill to create a right frontal burr hole. I exposed the underlying dura. I coagulated dura with bipolar electrocautery. I incised the dura with a 15 blade scalpel and coagulated the dural edges. I coagulated the pial surface of the brain. I then cannulated the patient's ventricular system using standard trajectories with a ventricular catheter on the first pass. I then removed the stylette and threaded the catheter another centimeter or 2 deeper into the ventricular system. There was good flow of CSF through the  catheter. I then cut the catheter and connected to the Ommaya reservoir. I secured the connection with a 0 silk suture tie. I then seated the Ommaya reservoir in the burr hole I secured it in place with figure-of-eight 0 Vicryl suture in the periosteum. I then removed the retractors. We irrigated the wound out with bacitracin solution. We reapproximated the patient's galea with interrupted 2-0 Vicryl suture. We reapproximated the skin with stainless steel staples. The wound was then coated with bacitracin ointment. A sterile dressing was applied. The drapes were removed. The patient was subsequently extubated by the anesthesia team. He was then transported to the post anesthesia care unit in stable condition. By report all sponge instrument and needle counts were correct at the end this case.

## 2013-03-15 NOTE — Anesthesia Preprocedure Evaluation (Signed)
Anesthesia Evaluation  Patient identified by MRN, date of birth, ID band Patient awake    Reviewed: Allergy & Precautions, H&P , NPO status , Patient's Chart, lab work & pertinent test results  Airway Mallampati: I TM Distance: >3 FB Neck ROM: Full    Dental   Pulmonary          Cardiovascular hypertension, Pt. on medications     Neuro/Psych    GI/Hepatic   Endo/Other  diabetes, Type 2, Insulin Dependent  Renal/GU      Musculoskeletal   Abdominal   Peds  Hematology   Anesthesia Other Findings   Reproductive/Obstetrics                           Anesthesia Physical Anesthesia Plan  ASA: III  Anesthesia Plan: General   Post-op Pain Management:    Induction: Intravenous  Airway Management Planned: Oral ETT  Additional Equipment:   Intra-op Plan:   Post-operative Plan: Extubation in OR  Informed Consent: I have reviewed the patients History and Physical, chart, labs and discussed the procedure including the risks, benefits and alternatives for the proposed anesthesia with the patient or authorized representative who has indicated his/her understanding and acceptance.     Plan Discussed with: CRNA and Surgeon  Anesthesia Plan Comments:         Anesthesia Quick Evaluation

## 2013-03-15 NOTE — Transfer of Care (Signed)
Immediate Anesthesia Transfer of Care Note  Patient: Keith Duffy  Procedure(s) Performed: Procedure(s) with comments: CHEMO RESERVOIR INSERTION (N/A) - right  Patient Location: PACU  Anesthesia Type:General  Level of Consciousness: awake, alert  and oriented  Airway & Oxygen Therapy: Patient Spontanous Breathing and Patient connected to face mask oxygen  Post-op Assessment: Report given to PACU RN  Post vital signs: Reviewed and stable  Complications: No apparent anesthesia complications

## 2013-03-15 NOTE — Plan of Care (Signed)
Problem: Phase I Progression Outcomes Goal: OOB as tolerated unless otherwise ordered Outcome: Completed/Met Date Met:  03/15/13 Unsteady gait due to LLE weakness (foot drop). Up with assist.

## 2013-03-15 NOTE — Consult Note (Signed)
Reason for Consult: Carcinomatous meningitis Referring Physician: Dr. Burney Gauze  Keith Duffy is an 68 y.o. male.  HPI: The patient is a 68 year old white male who has been diagnosed with lymphoma. His oncologist is Dr. Burney Gauze. The patient has been further worked up with a lumbar puncture which demonstrated carcinomatous meningitis. Dr. Marin Olp has asked me to place an Ommaya reservoir for CSF access. The patient has been transferred from [facility] to [facility] for placement of the reservoir.  Presently the patient is alert and pleasant. He is accompanied by his wife. The patient has had a right peripheral facial palsy for a couple weeks. He is also noted numbness and weakness in his left extensor hallucis longus, dorsi flexors.  Past Medical History  Diagnosis Date  . Back pain   . Hypertension   . Diabetes mellitus without complication   . Cancer     non hodkins lymphoma    History reviewed. No pertinent past surgical history.  Family History  Problem Relation Age of Onset  . Heart attack Father   . Heart attack Maternal Grandfather     Social History:  reports that he has never smoked. He has never used smokeless tobacco. He reports that he does not drink alcohol or use illicit drugs.  Allergies:  Allergies  Allergen Reactions  . Aspartame And Phenylalanine Diarrhea  . Codeine Other (See Comments)    "Made me go wild when I was young"    Medications:  I have reviewed the patient's current medications. Prior to Admission:  Prescriptions prior to admission  Medication Sig Dispense Refill  . acetaminophen (TYLENOL) 500 MG tablet Take 1,000 mg by mouth daily as needed (pain).       . famciclovir (FAMVIR) 250 MG tablet Take 250 mg by mouth daily. To prevent shingles      . fentaNYL (DURAGESIC - DOSED MCG/HR) 12 MCG/HR Place 1 patch (12.5 mcg total) onto the skin every other day.  15 patch  0  . insulin aspart (NOVOLOG FLEXPEN) 100 UNIT/ML  SOPN FlexPen Inject 0-9 Units into the skin 3 (three) times daily with meals. CBG < 70: eat or drink something sweet & recheck blood sugar. CBG 70 - 120: 0 units  CBG 121 - 150: 1 unit  CBG 151 - 200: 2 units  CBG 201 - 250: 3 units  CBG 251 - 300: 5 units  CBG 301 - 350: 7 units  CBG 351 - 400: 9 units  CBG > 400: call MD  5 pen  0  . Insulin Glargine (LANTUS SOLOSTAR) 100 UNIT/ML Solostar Pen Inject 25 Units into the skin at bedtime.      Marland Kitchen ketoconazole (NIZORAL) 200 MG tablet Take 200 mg by mouth daily.      . meloxicam (MOBIC) 15 MG tablet Take 1 tablet (15 mg total) by mouth every morning.  30 tablet  6  . metoprolol tartrate (LOPRESSOR) 25 MG tablet Take 25 mg by mouth 2 (two) times daily. 1 tablet every morning and 1 tablet at lunch      . Oxycodone HCl 10 MG TABS Take 10-20 mg by mouth every 4 (four) hours as needed (breakthrough pain).      . predniSONE (DELTASONE) 20 MG tablet Take 10-60 mg by mouth daily with breakfast. Tapered course started 03/08/13:  Take 3 tablets (60 mg) for 5 days, then take 2 1/2 (50 mg) tablets on 6th day, then take 2 tablets (40 mg) on 7th day,  then take 1 1/2 tablets (30 mg) on 8th day, then take 1 tablet (20 mg) on 9th day, then take 1/2 tablet (10 mg) on 10th day, then stop       Scheduled: . [facility] HOLD] antiseptic oral rinse  15 mL Mouth Rinse 6 X Daily  . [facility] HOLD] artificial tears   Right Eye Q8H  . [MAR HOLD] dexamethasone  40 mg Intravenous Q24H  . [facility] HOLD] famciclovir  500 mg Oral Daily  . [facility] HOLD] fentaNYL  12.5 mcg Transdermal Q48H  . [MAR HOLD] insulin aspart  0-20 Units Subcutaneous TID WC  . [MAR HOLD] insulin aspart  6 Units Subcutaneous TID WC  . [MAR HOLD] insulin glargine  25 Units Subcutaneous QHS  . [facility] HOLD] metoprolol tartrate  25 mg Oral BID  . [facility] HOLD] sodium bicarbonate/sodium chloride   Mouth Rinse 6 X Daily  . [facility] HOLD] tamsulosin  0.4 mg Oral QPC breakfast   Continuous: . sodium chloride 250 mL (03/14/13 0519)  .   sodium bicarbonate infusion 1/4 NS 1000 mL 100 mL/hr at 03/15/13 1200   TML:YYTKPTWSFKCLE (DILAUDID) injection, [MAR HOLD]  HYDROmorphone (DILAUDID) injection, [MAR HOLD] LORazepam, meperidine (DEMEROL) injection, ondansetron (ZOFRAN) IV, [MAR HOLD] ondansetron, [MAR HOLD] oxyCODONE, oxyCODONE, oxyCODONE, [MAR HOLD] prochlorperazine, [MAR HOLD] sodium chloride, [MAR HOLD] sodium chloride Anti-infectives   Start     Dose/Rate Route Frequency Ordered Stop   03/13/13 1000  [MAR Hold]  famciclovir [facility]) tablet 500 mg     (On MAR Hold since 03/15/13 1238)   500 mg Oral Daily 03/12/13 1705         Results for orders placed during the hospital encounter of 03/12/13 (from the past 48 hour(s))  GLUCOSE, CAPILLARY     Status: Abnormal   Collection Time    03/13/13  5:07 PM      Result Value Range   Glucose-Capillary 149 (*) 70 - 99 mg/dL   Comment 1 Documented in Chart     Comment 2 Notify RN    URINALYSIS, DIPSTICK ONLY     Status: Abnormal   Collection Time    03/13/13  6:04 PM      Result Value Range   Specific Gravity, Urine 1.014  1.005 - 1.030   pH 7.5  5.0 - 8.0   Glucose, UA 100 (*) NEGATIVE mg/dL   Hgb urine dipstick NEGATIVE  NEGATIVE   Bilirubin Urine NEGATIVE  NEGATIVE   Ketones, ur NEGATIVE  NEGATIVE mg/dL   Protein, ur NEGATIVE  NEGATIVE mg/dL   Urobilinogen, UA 0.2  0.0 - 1.0 mg/dL   Nitrite NEGATIVE  NEGATIVE   Leukocytes, UA NEGATIVE  NEGATIVE  GLUCOSE, CAPILLARY     Status: Abnormal   Collection Time    03/13/13  9:48 PM      Result Value Range   Glucose-Capillary 187 (*) 70 - 99 mg/dL   Comment 1 Notify RN    URINALYSIS, ROUTINE W REFLEX MICROSCOPIC     Status: Abnormal   Collection Time    03/14/13  4:46 AM      Result Value Range   Color, Urine YELLOW  YELLOW   APPearance CLEAR  CLEAR   Specific Gravity, Urine 1.025  1.005 - 1.030   pH 6.0  5.0 - 8.0   Glucose, UA >1000 (*) NEGATIVE mg/dL   Hgb urine dipstick NEGATIVE  NEGATIVE   Bilirubin Urine NEGATIVE   NEGATIVE   Ketones, ur NEGATIVE  NEGATIVE mg/dL   Protein,  ur NEGATIVE  NEGATIVE mg/dL   Urobilinogen, UA 0.2  0.0 - 1.0 mg/dL   Nitrite NEGATIVE  NEGATIVE   Leukocytes, UA NEGATIVE  NEGATIVE  URINE MICROSCOPIC-ADD ON     Status: None   Collection Time    03/14/13  4:46 AM      Result Value Range   RBC / HPF 0-2  <3 RBC/hpf   Bacteria, UA RARE  RARE  CBC     Status: Abnormal   Collection Time    03/14/13  5:15 AM      Result Value Range   WBC 4.9  4.0 - 10.5 K/uL   RBC 3.39 (*) 4.22 - 5.81 MIL/uL   Hemoglobin 10.9 (*) 13.0 - 17.0 g/dL   HCT 31.4 (*) 39.0 - 52.0 %   MCV 92.6  78.0 - 100.0 fL   MCH 32.2  26.0 - 34.0 pg   MCHC 34.7  30.0 - 36.0 g/dL   RDW 15.0  11.5 - 15.5 %   Platelets 150  150 - 400 K/uL   Comment: DELTA CHECK NOTED     SPECIMEN CHECKED FOR CLOTS     REPEATED TO VERIFY  BASIC METABOLIC PANEL     Status: Abnormal   Collection Time    03/14/13  5:15 AM      Result Value Range   Sodium 136 (*) 137 - 147 mEq/L   Potassium 4.2  3.7 - 5.3 mEq/L   Comment: DELTA CHECK NOTED     REPEATED TO VERIFY     NO VISIBLE HEMOLYSIS   Chloride 99  96 - 112 mEq/L   CO2 24  19 - 32 mEq/L   Glucose, Bld 153 (*) 70 - 99 mg/dL   BUN 21  6 - 23 mg/dL   Creatinine, Ser 0.86  0.50 - 1.35 mg/dL   Calcium 8.5  8.4 - 10.5 mg/dL   GFR calc non Af Amer 89 (*) >90 mL/min   GFR calc Af Amer >90  >90 mL/min   Comment: (NOTE)     The eGFR has been calculated using the CKD EPI equation.     This calculation has not been validated in all clinical situations.     eGFR's persistently <90 mL/min signify possible Chronic Kidney     Disease.  GLUCOSE, CAPILLARY     Status: Abnormal   Collection Time    03/14/13  7:31 AM      Result Value Range   Glucose-Capillary 147 (*) 70 - 99 mg/dL   Comment 1 Documented in Chart     Comment 2 Notify RN    GLUCOSE, CAPILLARY     Status: Abnormal   Collection Time    03/14/13  8:57 AM      Result Value Range   Glucose-Capillary 190 (*) 70 - 99  mg/dL   Comment 1 Documented in Chart     Comment 2 Notify RN    GLUCOSE, CAPILLARY     Status: Abnormal   Collection Time    03/14/13 12:00 PM      Result Value Range   Glucose-Capillary 166 (*) 70 - 99 mg/dL   Comment 1 Documented in Chart     Comment 2 Notify RN    URINALYSIS, DIPSTICK ONLY     Status: Abnormal   Collection Time    03/14/13 12:06 PM      Result Value Range   Specific Gravity, Urine 1.020  1.005 - 1.030   pH 6.5  5.0 - 8.0   Glucose, UA >1000 (*) NEGATIVE mg/dL   Hgb urine dipstick NEGATIVE  NEGATIVE   Bilirubin Urine NEGATIVE  NEGATIVE   Ketones, ur NEGATIVE  NEGATIVE mg/dL   Protein, ur NEGATIVE  NEGATIVE mg/dL   Urobilinogen, UA 0.2  0.0 - 1.0 mg/dL   Nitrite NEGATIVE  NEGATIVE   Leukocytes, UA NEGATIVE  NEGATIVE  URINALYSIS, DIPSTICK ONLY     Status: Abnormal   Collection Time    03/14/13  2:03 PM      Result Value Range   Specific Gravity, Urine 1.023  1.005 - 1.030   pH 6.5  5.0 - 8.0   Glucose, UA >1000 (*) NEGATIVE mg/dL   Hgb urine dipstick NEGATIVE  NEGATIVE   Bilirubin Urine NEGATIVE  NEGATIVE   Ketones, ur NEGATIVE  NEGATIVE mg/dL   Protein, ur NEGATIVE  NEGATIVE mg/dL   Urobilinogen, UA 0.2  0.0 - 1.0 mg/dL   Nitrite NEGATIVE  NEGATIVE   Leukocytes, UA NEGATIVE  NEGATIVE  URINALYSIS, DIPSTICK ONLY     Status: Abnormal   Collection Time    03/14/13  4:16 PM      Result Value Range   Specific Gravity, Urine 1.028  1.005 - 1.030   pH 6.5  5.0 - 8.0   Glucose, UA >1000 (*) NEGATIVE mg/dL   Hgb urine dipstick NEGATIVE  NEGATIVE   Bilirubin Urine NEGATIVE  NEGATIVE   Ketones, ur NEGATIVE  NEGATIVE mg/dL   Protein, ur NEGATIVE  NEGATIVE mg/dL   Urobilinogen, UA 1.0  0.0 - 1.0 mg/dL   Nitrite NEGATIVE  NEGATIVE   Leukocytes, UA NEGATIVE  NEGATIVE  GLUCOSE, CAPILLARY     Status: Abnormal   Collection Time    03/14/13  5:28 PM      Result Value Range   Glucose-Capillary 213 (*) 70 - 99 mg/dL   Comment 1 Documented in Chart     Comment 2  Notify RN    URINALYSIS, DIPSTICK ONLY     Status: Abnormal   Collection Time    03/14/13  7:46 PM      Result Value Range   Specific Gravity, Urine 1.027  1.005 - 1.030   pH 6.5  5.0 - 8.0   Glucose, UA >1000 (*) NEGATIVE mg/dL   Hgb urine dipstick NEGATIVE  NEGATIVE   Bilirubin Urine NEGATIVE  NEGATIVE   Ketones, ur NEGATIVE  NEGATIVE mg/dL   Protein, ur NEGATIVE  NEGATIVE mg/dL   Urobilinogen, UA 0.2  0.0 - 1.0 mg/dL   Nitrite NEGATIVE  NEGATIVE   Leukocytes, UA NEGATIVE  NEGATIVE  GLUCOSE, CAPILLARY     Status: Abnormal   Collection Time    03/14/13  9:56 PM      Result Value Range   Glucose-Capillary 186 (*) 70 - 99 mg/dL   Comment 1 Notify RN    URINALYSIS, DIPSTICK ONLY     Status: Abnormal   Collection Time    03/14/13 10:00 PM      Result Value Range   Specific Gravity, Urine 1.025  1.005 - 1.030   pH 7.0  5.0 - 8.0   Glucose, UA >1000 (*) NEGATIVE mg/dL   Hgb urine dipstick NEGATIVE  NEGATIVE   Bilirubin Urine NEGATIVE  NEGATIVE   Ketones, ur NEGATIVE  NEGATIVE mg/dL   Protein, ur NEGATIVE  NEGATIVE mg/dL   Urobilinogen, UA 0.2  0.0 - 1.0 mg/dL   Nitrite NEGATIVE  NEGATIVE   Leukocytes, UA NEGATIVE  NEGATIVE  CBC     Status: Abnormal   Collection Time    03/15/13  4:50 AM      Result Value Range   WBC 6.8  4.0 - 10.5 K/uL   RBC 3.22 (*) 4.22 - 5.81 MIL/uL   Hemoglobin 10.5 (*) 13.0 - 17.0 g/dL   HCT 29.8 (*) 39.0 - 52.0 %   MCV 92.5  78.0 - 100.0 fL   MCH 32.6  26.0 - 34.0 pg   MCHC 35.2  30.0 - 36.0 g/dL   RDW 14.9  11.5 - 15.5 %   Platelets 126 (*) 150 - 400 K/uL  BASIC METABOLIC PANEL     Status: Abnormal   Collection Time    03/15/13  4:50 AM      Result Value Range   Sodium 137  137 - 147 mEq/L   Potassium 3.5 (*) 3.7 - 5.3 mEq/L   Chloride 97  96 - 112 mEq/L   CO2 29  19 - 32 mEq/L   Glucose, Bld 134 (*) 70 - 99 mg/dL   BUN 19  6 - 23 mg/dL   Creatinine, Ser 0.78  0.50 - 1.35 mg/dL   Calcium 8.6  8.4 - 10.5 mg/dL   GFR calc non Af Amer >90   >90 mL/min   GFR calc Af Amer >90  >90 mL/min   Comment: (NOTE)     The eGFR has been calculated using the CKD EPI equation.     This calculation has not been validated in all clinical situations.     eGFR's persistently <90 mL/min signify possible Chronic Kidney     Disease.  URINALYSIS, DIPSTICK ONLY     Status: Abnormal   Collection Time    03/15/13  4:51 AM      Result Value Range   Specific Gravity, Urine 1.012  1.005 - 1.030   pH 8.0  5.0 - 8.0   Glucose, UA 100 (*) NEGATIVE mg/dL   Hgb urine dipstick NEGATIVE  NEGATIVE   Bilirubin Urine NEGATIVE  NEGATIVE   Ketones, ur NEGATIVE  NEGATIVE mg/dL   Protein, ur NEGATIVE  NEGATIVE mg/dL   Urobilinogen, UA 0.2  0.0 - 1.0 mg/dL   Nitrite NEGATIVE  NEGATIVE   Leukocytes, UA NEGATIVE  NEGATIVE  URINALYSIS, DIPSTICK ONLY     Status: Abnormal   Collection Time    03/15/13  5:24 AM      Result Value Range   Specific Gravity, Urine 1.011  1.005 - 1.030   pH 8.0  5.0 - 8.0   Glucose, UA 100 (*) NEGATIVE mg/dL   Hgb urine dipstick NEGATIVE  NEGATIVE   Bilirubin Urine NEGATIVE  NEGATIVE   Ketones, ur NEGATIVE  NEGATIVE mg/dL   Protein, ur NEGATIVE  NEGATIVE mg/dL   Urobilinogen, UA 0.2  0.0 - 1.0 mg/dL   Nitrite NEGATIVE  NEGATIVE   Leukocytes, UA NEGATIVE  NEGATIVE  GLUCOSE, CAPILLARY     Status: Abnormal   Collection Time    03/15/13  7:19 AM      Result Value Range   Glucose-Capillary 119 (*) 70 - 99 mg/dL  GLUCOSE, CAPILLARY     Status: None   Collection Time    03/15/13 12:08 PM      Result Value Range   Glucose-Capillary 95  70 - 99 mg/dL    No results found.  ROS: As above Blood pressure 161/79, pulse 75, temperature 98.5 F (36.9 C), temperature source Oral, resp. rate 7, height 5'  9" (1.753 m), weight 74.5 kg (164 lb 3.9 oz), SpO2 96.00%. Physical Exam  General: An alert and pleasant 68 year old white male with a right facial palsy.  HEENT: Normocephalic, atraumatic, pupils equal.  Neurologic exam: The  patient is alert and oriented x3, cranial nerves II through XII were examined bilaterally and grossly normal except as above has a right per full facial nerve palsy. His vision and hearing are grossly normal. The patient's motor strength is 5 over 5 his bilateral deltoid, biceps, triceps, hand grip, quadriceps, right gastrocnemius and extensor hallucis longus. The patient has weakness in his left EHL at 0/5, he has slight weakness in his left gastrocnemius. Sensory exam demonstrates decreased light-touch sensation in the left L5 distribution. Cerebellar function is intact to rapid alternating movements of the upper extremities bilaterally.  Assessment/Plan: Carcinomatous meningitis, lymphoma: I have discussed the situation with the patient and his wife. We have discussed the need for intrathecal chemotherapy. We have discussed the options of repeated lumbar punctures versus placement of a Ommaya reservoir. I described the surgery to him. We have discussed the risks of surgery including the risks of anesthesia, hemorrhage, seizures, stroke, infection, catheter malplacement or malfunction, medical risk, etc. I've answered all the patient, and his wife's, questions. He would like to proceed with surgery.  Mikelle Myrick D 03/15/2013, 2:10 PM

## 2013-03-15 NOTE — Progress Notes (Signed)
Dr Arnoldo Morale called to give order to make patient NPO after 6am in the morning 1/26. Spoke to Dr about chemotherapy and delay in starting methotrexate. Dr Arnoldo Morale to discuss with Dr Marin Olp in am if delay of surgery needed.

## 2013-03-16 ENCOUNTER — Inpatient Hospital Stay (HOSPITAL_COMMUNITY): Payer: Medicare Other

## 2013-03-16 ENCOUNTER — Encounter (HOSPITAL_COMMUNITY): Payer: Self-pay | Admitting: Radiology

## 2013-03-16 DIAGNOSIS — M216X9 Other acquired deformities of unspecified foot: Secondary | ICD-10-CM

## 2013-03-16 LAB — URINALYSIS, DIPSTICK ONLY
BILIRUBIN URINE: NEGATIVE
BILIRUBIN URINE: NEGATIVE
Bilirubin Urine: NEGATIVE
Bilirubin Urine: NEGATIVE
Bilirubin Urine: NEGATIVE
Bilirubin Urine: NEGATIVE
Bilirubin Urine: NEGATIVE
GLUCOSE, UA: NEGATIVE mg/dL
GLUCOSE, UA: NEGATIVE mg/dL
Glucose, UA: 100 mg/dL — AB
Glucose, UA: 250 mg/dL — AB
Glucose, UA: >1000 mg/dL — AB
Glucose, UA: NEGATIVE mg/dL
Glucose, UA: NEGATIVE mg/dL
HGB URINE DIPSTICK: NEGATIVE
HGB URINE DIPSTICK: NEGATIVE
Hgb urine dipstick: NEGATIVE
Hgb urine dipstick: NEGATIVE
Hgb urine dipstick: NEGATIVE
Hgb urine dipstick: NEGATIVE
Hgb urine dipstick: NEGATIVE
KETONES UR: NEGATIVE mg/dL
KETONES UR: NEGATIVE mg/dL
Ketones, ur: NEGATIVE mg/dL
Ketones, ur: NEGATIVE mg/dL
Ketones, ur: NEGATIVE mg/dL
Ketones, ur: NEGATIVE mg/dL
Ketones, ur: NEGATIVE mg/dL
LEUKOCYTES UA: NEGATIVE
LEUKOCYTES UA: NEGATIVE
Leukocytes, UA: NEGATIVE
Leukocytes, UA: NEGATIVE
Leukocytes, UA: NEGATIVE
Leukocytes, UA: NEGATIVE
Leukocytes, UA: NEGATIVE
NITRITE: NEGATIVE
NITRITE: NEGATIVE
NITRITE: NEGATIVE
Nitrite: NEGATIVE
Nitrite: NEGATIVE
Nitrite: NEGATIVE
Nitrite: NEGATIVE
PH: 8.5 — AB (ref 5.0–8.0)
PROTEIN: NEGATIVE mg/dL
PROTEIN: NEGATIVE mg/dL
PROTEIN: 30 mg/dL — AB
PROTEIN: 30 mg/dL — AB
Protein, ur: NEGATIVE mg/dL
Protein, ur: NEGATIVE mg/dL
Protein, ur: NEGATIVE mg/dL
SPECIFIC GRAVITY, URINE: 1.01 (ref 1.005–1.030)
SPECIFIC GRAVITY, URINE: 1.011 (ref 1.005–1.030)
SPECIFIC GRAVITY, URINE: 1.01 (ref 1.005–1.030)
Specific Gravity, Urine: 1.011 (ref 1.005–1.030)
Specific Gravity, Urine: 1.012 (ref 1.005–1.030)
Specific Gravity, Urine: 1.014 (ref 1.005–1.030)
Specific Gravity, Urine: 1.015 (ref 1.005–1.030)
UROBILINOGEN UA: 0.2 mg/dL (ref 0.0–1.0)
UROBILINOGEN UA: 0.2 mg/dL (ref 0.0–1.0)
UROBILINOGEN UA: 0.2 mg/dL (ref 0.0–1.0)
UROBILINOGEN UA: 0.2 mg/dL (ref 0.0–1.0)
Urobilinogen, UA: 0.2 mg/dL (ref 0.0–1.0)
Urobilinogen, UA: 0.2 mg/dL (ref 0.0–1.0)
Urobilinogen, UA: 0.2 mg/dL (ref 0.0–1.0)
pH: 7.5 (ref 5.0–8.0)
pH: 8 (ref 5.0–8.0)
pH: 8.5 — ABNORMAL HIGH (ref 5.0–8.0)
pH: 8.5 — ABNORMAL HIGH (ref 5.0–8.0)
pH: 8.5 — ABNORMAL HIGH (ref 5.0–8.0)
pH: 8.5 — ABNORMAL HIGH (ref 5.0–8.0)

## 2013-03-16 LAB — CBC
HCT: 31.1 % — ABNORMAL LOW (ref 39.0–52.0)
HEMOGLOBIN: 10.9 g/dL — AB (ref 13.0–17.0)
MCH: 32.8 pg (ref 26.0–34.0)
MCHC: 35 g/dL (ref 30.0–36.0)
MCV: 93.7 fL (ref 78.0–100.0)
Platelets: 102 10*3/uL — ABNORMAL LOW (ref 150–400)
RBC: 3.32 MIL/uL — ABNORMAL LOW (ref 4.22–5.81)
RDW: 15.3 % (ref 11.5–15.5)
WBC: 5.4 10*3/uL (ref 4.0–10.5)

## 2013-03-16 LAB — GLUCOSE, CAPILLARY
GLUCOSE-CAPILLARY: 193 mg/dL — AB (ref 70–99)
GLUCOSE-CAPILLARY: 223 mg/dL — AB (ref 70–99)
Glucose-Capillary: 95 mg/dL (ref 70–99)
Glucose-Capillary: 62 mg/dL — ABNORMAL LOW (ref 70–99)
Glucose-Capillary: 85 mg/dL (ref 70–99)

## 2013-03-16 LAB — BASIC METABOLIC PANEL
BUN: 15 mg/dL (ref 6–23)
CHLORIDE: 95 meq/L — AB (ref 96–112)
CO2: 31 mEq/L (ref 19–32)
CREATININE: 0.82 mg/dL (ref 0.50–1.35)
Calcium: 8.4 mg/dL (ref 8.4–10.5)
GFR calc Af Amer: >90 mL/min (ref >90)
GFR calc non Af Amer: >90 mL/min (ref >90)
GLUCOSE: 93 mg/dL (ref 70–99)
POTASSIUM: 2.9 meq/L — AB (ref 3.7–5.3)
Sodium: 139 mEq/L (ref 137–147)

## 2013-03-16 MED ORDER — SODIUM CHLORIDE 0.9 % IV SOLN
1.5000 g/m2 | Freq: Once | INTRAVENOUS | Status: AC
  Administered 2013-03-16: 2.85 g via INTRAVENOUS
  Filled 2013-03-16 (×3): qty 114

## 2013-03-16 MED ORDER — POTASSIUM CHLORIDE CRYS ER 20 MEQ PO TBCR
40.0000 meq | EXTENDED_RELEASE_TABLET | Freq: Two times a day (BID) | ORAL | Status: DC
  Administered 2013-03-16 – 2013-03-21 (×12): 40 meq via ORAL
  Filled 2013-03-16 (×15): qty 2

## 2013-03-16 MED ORDER — SODIUM CHLORIDE 0.9 % IJ SOLN: 3.0000 mL | INTRAMUSCULAR | Status: DC | PRN

## 2013-03-16 MED ORDER — LEUCOVORIN CALCIUM INJECTION 100 MG
15.0000 mg/m2 | INTRAMUSCULAR | Status: AC
  Administered 2013-03-18 – 2013-03-19 (×9): 28 mg via INTRAVENOUS
  Filled 2013-03-16 (×9): qty 1.4

## 2013-03-16 MED ORDER — SODIUM CHLORIDE 0.9 % IV SOLN
16.0000 mg | INTRAVENOUS | Status: AC
  Administered 2013-03-16 – 2013-03-17 (×2): 16 mg via INTRAVENOUS
  Filled 2013-03-16 (×3): qty 8

## 2013-03-16 MED ORDER — SODIUM CHLORIDE 0.9 % IJ SOLN
10.0000 mL | INTRAMUSCULAR | Status: DC | PRN
Start: 2013-03-16 — End: 2013-03-22

## 2013-03-16 MED ORDER — ALTEPLASE 2 MG IJ SOLR
2.0000 mg | Freq: Once | INTRAMUSCULAR | Status: AC | PRN
  Filled 2013-03-16: qty 2

## 2013-03-16 MED ORDER — LEUCOVORIN CALCIUM INJECTION 100 MG
15.0000 mg/m2 | Freq: Four times a day (QID) | INTRAMUSCULAR | Status: DC
  Filled 2013-03-16: qty 1.4

## 2013-03-16 MED ORDER — HEPARIN SOD (PORK) LOCK FLUSH 100 UNIT/ML IV SOLN: 500.0000 [IU] | Freq: Once | INTRAVENOUS | Status: AC | PRN

## 2013-03-16 MED ORDER — HEPARIN SOD (PORK) LOCK FLUSH 100 UNIT/ML IV SOLN: 250.0000 [IU] | Freq: Once | INTRAVENOUS | Status: AC | PRN

## 2013-03-16 MED ORDER — SODIUM CHLORIDE 0.9 % IV SOLN
INTRAVENOUS | Status: DC
  Administered 2013-03-16: 17:00:00 via INTRAVENOUS

## 2013-03-16 MED ORDER — SODIUM CHLORIDE 0.9 % IV SOLN
6.9000 g/m2 | Freq: Once | INTRAVENOUS | Status: AC
  Administered 2013-03-16: 13.11 g via INTRAVENOUS
  Filled 2013-03-16: qty 524.4

## 2013-03-16 NOTE — Progress Notes (Signed)
Keith Duffy is his Ommaya reservoir placed. I very much appreciate the help from Dr. Arnoldo Morale. He did as well the complications.  We now can proceed with the methotrexate portion of his chemotherapy.  Keith Duffy still has the Bell's palsy. This really has not changed.  He doesn't have too much pain. He's had no real nausea vomiting. He is somewhat tired. He had had a lot of sleep.  He did have a CT scan of the brain today. This showed placement of the reservoir in the appropriate ventricle.  On his physical exam, his temperature is 97.3. Pulse 83. Blood pressure 129/65. His head exam shows the right cranial nerve 7 palsy. There is no oral lesions. There is no thrush. He has no adenopathy in the neck. Lungs are clear. Cardiac exam regular rate and rhythm with no murmurs rubs or bruits. Abdomen is soft. He has good bowel sounds. There is no palpable abdominal mass. There is no palpable hepatosplenomegaly.   back exam shows no tenderness over the spine but. Extremities shows no clubbing cyanosis or edema. His strength is 4/5 bilaterally. Skin exam no rashes. Neurological exam shows the cranial nerve 7 dysfunction on the right in the left foot drop.  His labs show a white cell count 5.4. Hemoglobin 11. Platelet count is 102. Potassium 2.9. Glucose 93.  We will proceed with the methotrexate. His urine pH is 8.5. He has good kidney function.  I will see about physical therapy stopping by to try to help with the left foot drop.  I'm not sure if he did have a PET scan done. He was scheduled for one tomorrow as an outpatient. It would be nice to get this done while he is in the hospital if possible.  He probably needs to have a bone marrow biopsy done. We'll see about this be done as an inpatient.  I still anticipate another for 5 days in the hospital as we need to follow his methotrexate levels.  [name]  [place]illipians 4:13

## 2013-03-16 NOTE — Progress Notes (Signed)
Patient ID: Keith Duffy, male   DOB: Aug 16, 1945, 68 y.o.   MRN: [id number] Subjective:  The patient is alert and pleasant. He looks well. He has no complaints.  Objective: Vital signs in last 24 hours: Temp:  [97.5 F (36.4 C)-98.6 F (37 C)] 98.6 F (37 C) (01/27 0339) Pulse Rate:  [63-137] 72 (01/27 0700) Resp:  [7-25] 21 (01/27 0700) BP: (107-167)/(51-108) 143/71 mmHg (01/27 0700) SpO2:  [85 %-100 %] 97 % (01/27 0700) Weight:  [74.3 kg (163 lb 12.8 oz)] 74.3 kg (163 lb 12.8 oz) (01/27 0414)  Intake/Output from previous day: 01/26 0701 - 01/27 0700 In: 3031.7 [P.O.:30; I.V.:2901.7; IV Piggyback:100] Out: 4120 [Urine:4100; Blood:20] Intake/Output this shift:    Physical exam the patient is alert and oriented. He continues to have a right facial palsy without change. His dressing has a small old bloodstain.  Lab Results:  Recent Labs  03/15/13 0450 03/16/13 0305  WBC 6.8 5.4  HGB 10.5* 10.9*  HCT 29.8* 31.1*  PLT 126* 102*   BMET  Recent Labs  03/15/13 0450 03/16/13 0305  NA 137 139  K 3.5* 2.9*  CL 97 95*  CO2 29 31  GLUCOSE 134* 93  BUN 19 15  CREATININE 0.78 0.82  CALCIUM 8.6 8.4    Studies/Results: No results found.  Assessment/Plan: Postop day #1: I will check a head CT. If it looks good we will transfer him back to [facility]. Marin Olp.  LOS: 4 days     Abem Shaddix D 03/16/2013, 7:52 AM

## 2013-03-16 NOTE — Progress Notes (Signed)
Report given to Kirkland Hun, RN at [facility]. Will call care-link. Will continue to monitor.

## 2013-03-16 NOTE — Progress Notes (Signed)
CRITICAL VALUE ALERT  Critical value received:  K+ 2.9   Date of notification:  03/16/2013  Time of notification:  0420  Critical value read back:Yes  Nurse who received alert:  Thayer Ohm, R.N.  MD notified (1st page):  Dr. Ellene Route  Time of first page:  0423  MD notified (2nd page): Dr. Ellene Route  Time of second page:0500  Responding MD:  Dr. Ellene Route  Time MD responded:  [phone]

## 2013-03-16 NOTE — Progress Notes (Signed)
Pt being transferred to [facility] via [place]. Report called to Veva Holes, RN on [place]. Report given to [place].

## 2013-03-16 NOTE — Progress Notes (Signed)
Notified Dr. Arnoldo Morale of CT results. CT review by Dr. Arnoldo Morale. Transfer orders in place. Will continue to monitor.

## 2013-03-17 ENCOUNTER — Encounter (HOSPITAL_COMMUNITY): Admission: RE | Admit: 2013-03-17 | Payer: Medicare Other | Source: Ambulatory Visit

## 2013-03-17 LAB — URINALYSIS, DIPSTICK ONLY
BILIRUBIN URINE: NEGATIVE
BILIRUBIN URINE: NEGATIVE
Bilirubin Urine: NEGATIVE
Bilirubin Urine: NEGATIVE
Bilirubin Urine: NEGATIVE
Bilirubin Urine: NEGATIVE
Bilirubin Urine: NEGATIVE
GLUCOSE, UA: 100 mg/dL — AB
Glucose, UA: 250 mg/dL — AB
Glucose, UA: 250 mg/dL — AB
Glucose, UA: 500 mg/dL — AB
Glucose, UA: 500 mg/dL — AB
Glucose, UA: 500 mg/dL — AB
Glucose, UA: >1000 mg/dL — AB
Hgb urine dipstick: NEGATIVE
Hgb urine dipstick: NEGATIVE
Hgb urine dipstick: NEGATIVE
Hgb urine dipstick: NEGATIVE
Hgb urine dipstick: NEGATIVE
Hgb urine dipstick: NEGATIVE
Hgb urine dipstick: NEGATIVE
KETONES UR: NEGATIVE mg/dL
KETONES UR: NEGATIVE mg/dL
KETONES UR: NEGATIVE mg/dL
KETONES UR: NEGATIVE mg/dL
Ketones, ur: NEGATIVE mg/dL
Ketones, ur: NEGATIVE mg/dL
Ketones, ur: NEGATIVE mg/dL
LEUKOCYTES UA: NEGATIVE
LEUKOCYTES UA: NEGATIVE
LEUKOCYTES UA: NEGATIVE
LEUKOCYTES UA: NEGATIVE
Leukocytes, UA: NEGATIVE
Leukocytes, UA: NEGATIVE
Leukocytes, UA: NEGATIVE
NITRITE: NEGATIVE
NITRITE: NEGATIVE
NITRITE: NEGATIVE
NITRITE: NEGATIVE
Nitrite: NEGATIVE
Nitrite: NEGATIVE
Nitrite: NEGATIVE
PH: 7.5 (ref 5.0–8.0)
PH: 8 (ref 5.0–8.0)
PH: 7.5 (ref 5.0–8.0)
PROTEIN: 30 mg/dL — AB
PROTEIN: 30 mg/dL — AB
PROTEIN: 30 mg/dL — AB
Protein, ur: 30 mg/dL — AB
Protein, ur: 30 mg/dL — AB
Protein, ur: 30 mg/dL — AB
Protein, ur: NEGATIVE mg/dL
SPECIFIC GRAVITY, URINE: 1.016 (ref 1.005–1.030)
SPECIFIC GRAVITY, URINE: 1.017 (ref 1.005–1.030)
SPECIFIC GRAVITY, URINE: 1.018 (ref 1.005–1.030)
Specific Gravity, Urine: 1.011 (ref 1.005–1.030)
Specific Gravity, Urine: 1.014 (ref 1.005–1.030)
Specific Gravity, Urine: 1.015 (ref 1.005–1.030)
Specific Gravity, Urine: 1.012 (ref 1.005–1.030)
UROBILINOGEN UA: 0.2 mg/dL (ref 0.0–1.0)
UROBILINOGEN UA: 0.2 mg/dL (ref 0.0–1.0)
UROBILINOGEN UA: 0.2 mg/dL (ref 0.0–1.0)
UROBILINOGEN UA: 0.2 mg/dL (ref 0.0–1.0)
Urobilinogen, UA: 0.2 mg/dL (ref 0.0–1.0)
Urobilinogen, UA: 0.2 mg/dL (ref 0.0–1.0)
Urobilinogen, UA: 0.2 mg/dL (ref 0.0–1.0)
pH: 7.5 (ref 5.0–8.0)
pH: 8 (ref 5.0–8.0)
pH: 8 (ref 5.0–8.0)
pH: 8 (ref 5.0–8.0)

## 2013-03-17 LAB — BASIC METABOLIC PANEL
BUN: 17 mg/dL (ref 6–23)
CALCIUM: 8.6 mg/dL (ref 8.4–10.5)
CO2: 24 mEq/L (ref 19–32)
Chloride: 96 mEq/L (ref 96–112)
Creatinine, Ser: 0.84 mg/dL (ref 0.50–1.35)
GFR, EST NON AFRICAN AMERICAN: 90 mL/min — AB (ref >90)
GLUCOSE: 193 mg/dL — AB (ref 70–99)
POTASSIUM: 3.9 meq/L (ref 3.7–5.3)
SODIUM: 135 meq/L — AB (ref 137–147)

## 2013-03-17 LAB — GLUCOSE, CAPILLARY
GLUCOSE-CAPILLARY: 175 mg/dL — AB (ref 70–99)
Glucose-Capillary: 174 mg/dL — ABNORMAL HIGH (ref 70–99)
Glucose-Capillary: 164 mg/dL — ABNORMAL HIGH (ref 70–99)
Glucose-Capillary: 187 mg/dL — ABNORMAL HIGH (ref 70–99)

## 2013-03-17 LAB — CBC
HCT: 32.4 % — ABNORMAL LOW (ref 39.0–52.0)
HEMOGLOBIN: 11.3 g/dL — AB (ref 13.0–17.0)
MCH: 32 pg (ref 26.0–34.0)
MCHC: 34.9 g/dL (ref 30.0–36.0)
MCV: 91.8 fL (ref 78.0–100.0)
PLATELETS: 118 10*3/uL — AB (ref 150–400)
RBC: 3.53 MIL/uL — ABNORMAL LOW (ref 4.22–5.81)
RDW: 14.5 % (ref 11.5–15.5)
WBC: 6.9 10*3/uL (ref 4.0–10.5)

## 2013-03-17 MED ORDER — DEXAMETHASONE SODIUM PHOSPHATE 10 MG/ML IJ SOLN
40.0000 mg | INTRAMUSCULAR | Status: DC
  Administered 2013-03-17: 40 mg via INTRAVENOUS
  Filled 2013-03-17: qty 4

## 2013-03-17 MED ORDER — STERILE WATER FOR INJECTION IV SOLN
INTRAVENOUS | Status: DC
Start: 2013-03-17 — End: 2013-03-22
  Administered 2013-03-17 – 2013-03-21 (×4): via INTRAVENOUS
  Filled 2013-03-17 (×13): qty 9.7

## 2013-03-17 NOTE — Progress Notes (Signed)
[name] is now back to [facility]. He is on a methotrexate infusion.  He has some low blood sugar issues yesterday. Am I sure as to why that was. This morning his blood sugar was 223.  Is urinating well. He is having some diarrhea which she thinks is from the orange juice that he had for his hypoglycemia.  I did put in a consult for physical therapy to help with the left foot drop. Hopefully, they will see him today and give [place] some suggestions as to how to improve his mobility.  Hopefully, he we'll get his PET scan. This might be low but tough her to accomplish as an inpatient. Again, he was scheduled as an outpatient.  He's had may be some slight improvement in the right Bell's palsy.  His appetite has been okay. He's had a little nausea but no vomiting.  His urine pH continues to be alkaline.  He's had no cough. Pain wise, he's been doing fairly well.  His vital signs are good. Blood pressure little on the high side at 175 are 76. He is afebrile. His oral exam shows no mucositis. There is no adenopathy in the neck. Lungs are clear. Cardiac exam regular rate and rhythm with no murmurs rubs or bruits. Abdomen is soft. Has good bowel sounds. There is no fluid wave. There is no liver or spleen tip. Extremities shows no clubbing. Strength is 4/5 in his legs. Neurological exam shows the right cranial nerve 7 palsy and the left foot drop.  His labs show his white cell count 36.9. Hemoglobin 11.3. Platelet count 118. BUN 17 creatinine 0.84. Potassium 3.9.  We will continue the methotrexate infusion. He will this is up today. He we'll then start the leucovorin rescue.  We will continue to watch his blood sugars.  [name]  1 Chronicles 16:11

## 2013-03-17 NOTE — Progress Notes (Signed)
Patient ID: Keith Duffy, male   DOB: 09/26/1945, 68 y.o.   MRN: [id number] Request received for CT guided bone marrow biopsy on pt with history of NHL with CNS involvement/carcinomatous meningitis. Pt is s/p right frontal Ommaya reservoir placement on 1/26 by Dr. Arnoldo Morale. 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 Additional PMH as below. Exam: pt awake/alert; noted right CN VII palsy ; rt frontal Ommaya reservoir covered by bandage; chest- CTA bilat; clean, intact rt chest wall PAC; heart- RRR; abd- soft, +BS,NT; ext-left foot drop; no sig edema.    Filed Vitals:   03/16/13 1117 03/16/13 1210 03/16/13 2150 03/17/13 0537  BP:  151/76 160/83 175/76  Pulse: 83 84 85 70  Temp:  98.2 F (36.8 C) 98.3 F (36.8 C) 97.3 F (36.3 C)  TempSrc:  Oral Oral Oral  Resp: $Remo'13 18 18 18  'nUhvR$ Height:  $Remove'5\' 9"'lnDiRTz$  (1.753 m)    Weight:  161 lb (73.029 kg)  161 lb (73.029 kg)  SpO2: 100% 100% 99% 100%   Past Medical History  Diagnosis Date  . Back pain   . Hypertension   . Diabetes mellitus without complication   . Cancer     non hodkins lymphoma   Past Surgical History  Procedure Laterality Date  . Chemo reservior insertion N/A 03/15/2013    Procedure: CHEMO RESERVOIR INSERTION;  Surgeon: Ophelia Charter, MD;  Location: [facility];  Service: Neurosurgery;  Laterality: N/A;  right   Ct Head Wo Contrast  03/16/2013   CLINICAL DATA:  68 year old male with history of lymphoma. Reservoir placement. Initial encounter.  EXAM: CT HEAD WITHOUT CONTRAST  TECHNIQUE: Contiguous axial images were obtained from the base of the skull through the vertex without intravenous contrast.  COMPARISON:  Brain MRI 02/27/2013.  FINDINGS: Recent postoperative changes to the right convexity scalp soft tissues. Skin staples in place. Subcutaneous partially gas containing reservoir in place. Trace subcutaneous gas. Underlying cranial burr hole. Otherwise No acute osseous abnormality identified. Negative paranasal sinuses and  mastoids. Negative orbits soft tissues.  Small volume pneumoventricle with right frontal approach ventriculostomy terminating at the septum pellucidum at the level of the body of the right lateral ventricle. No ventriculomegaly.  No acute intracranial hemorrhage identified. No midline shift, mass effect, or evidence of intracranial mass lesion. Basilar cisterns are patent. No evidence of cortically based acute infarction identified. Stable small hypodensity at the posterior limb of the left internal capsule. No suspicious intracranial vascular hyperdensity.  IMPRESSION: 1. Status post recent placement of right frontal convexity reservoir and frontal approach ventriculostomy. Small volume pneumoventricle. 2. Otherwise stable brain.   Electronically Signed   By: Lars Pinks M.D.   On: 03/16/2013 11:15   Mr Jeri Cos BM Contrast  02/27/2013   CLINICAL DATA:  History of lymphoma.  Left leg weakness.  EXAM: MRI HEAD WITHOUT AND WITH CONTRAST  TECHNIQUE: Multiplanar, multiecho pulse sequences of the brain and surrounding structures were obtained without and with intravenous contrast.  CONTRAST:  15 cc MultiHance  COMPARISON:  08/24/2012  FINDINGS: Diffusion imaging does not show any acute or subacute infarction. The brainstem and cerebellum are unremarkable. 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 The brain otherwise appears normal. No evidence of mass lesion, hemorrhage, hydrocephalus or extra-axial collection. No abnormal enhancement occurs. No pituitary mass. No inflammatory sinus disease. I do  not believe that the marrow pattern as demonstrated is pathologic.  IMPRESSION: No evidence of residual or recurrent lymphoma. Normal study with exception of a couple of old small vessel infarctions in the left basal ganglia/posterior limb internal capsule.   Electronically Signed   By: Nelson Chimes  M.D.   On: 02/27/2013 15:31   Mr Lumbar Spine W Wo Contrast  02/24/2013   CLINICAL DATA:  Back pain. Known metastatic disease. Non-Hodgkin's lymphoma.  EXAM: MRI LUMBAR SPINE WITHOUT AND WITH CONTRAST  TECHNIQUE: Multiplanar and multiecho pulse sequences of the lumbar spine were obtained without and with intravenous contrast.  CONTRAST:  51mL MULTIHANCE GADOBENATE DIMEGLUMINE 529 MG/ML IV SOLN  COMPARISON:  08/20/2012.  FINDINGS: Normal and stable alignment of the lumbar vertebral bodies. The osseous lymphoma is much improved in appearance when compared to the prior study. No pathologic fractures identified. The conus medullaris terminates at L1-2. The facets are normally aligned. No pars defects. No significant paraspinal or retroperitoneal process. No retroperitoneal lymphadenopathy.  No significant disc protrusions, spinal or foraminal stenosis. There is degenerative disc disease at L5-S1 and mild right foraminal encroachment.  IMPRESSION: Significant improved appearance of the osseous lymphoma when compared to prior examination. No new disease and no complicating features such as pathologic fracture.  Stable degenerative disc disease at L5-S1 with mild right foraminal encroachment. No significant disc protrusions, spinal or foraminal stenosis.   Electronically Signed   By: Kalman Jewels M.D.   On: 02/24/2013 10:00   Dg Fluoro Guide Lumbar Puncture  03/05/2013   CLINICAL DATA:  Lymphoma.  EXAM: DIAGNOSTIC LUMBAR PUNCTURE UNDER FLUOROSCOPIC GUIDANCE  FLUOROSCOPY TIME:  0 min 36 seconds.  PROCEDURE: Informed consent was obtained from the patient prior to the procedure, including potential complications of headache, allergy, and pain. With the patient prone, the lower back was prepped with Betadine. 1% Lidocaine was used for local anesthesia. Lumbar puncture was performed at the L4-5 level using a 20 gauge needle with return of clear CSF. 11.59ml of CSF were obtained for laboratory studies. The patient  tolerated the procedure well and there were no apparent complications.  IMPRESSION: Successful lumbar puncture under fluoroscopy.   Electronically Signed   By: Lorin Picket M.D.   On: 03/05/2013 11:02  Results for orders placed during the hospital encounter of 03/12/13  CBC WITH DIFFERENTIAL      Result Value Range   WBC 11.5 (*) 4.0 - 10.5 K/uL   RBC 3.81 (*) 4.22 - 5.81 MIL/uL   Hemoglobin 12.4 (*) 13.0 - 17.0 g/dL   HCT 35.2 (*) 39.0 - 52.0 %   MCV 92.4  78.0 - 100.0 fL   MCH 32.5  26.0 - 34.0 pg   MCHC 35.2  30.0 - 36.0 g/dL   RDW 15.1  11.5 - 15.5 %   Platelets 199  150 - 400 K/uL   Neutrophils Relative % 81 (*) 43 - 77 %   Neutro Abs 9.3 (*) 1.7 - 7.7 K/uL   Lymphocytes Relative 12  12 - 46 %   Lymphs Abs 1.4  0.7 - 4.0 K/uL   Monocytes Relative 7  3 - 12 %   Monocytes Absolute 0.8  0.1 - 1.0 K/uL   Eosinophils Relative 0  0 - 5 %   Eosinophils Absolute 0.0  0.0 - 0.7 K/uL   Basophils Relative 0  0 - 1 %   Basophils Absolute 0.0  0.0 - 0.1 K/uL   Smear Review MORPHOLOGY UNREMARKABLE  COMPREHENSIVE METABOLIC PANEL      Result Value Range   Sodium 136 (*) 137 - 147 mEq/L   Potassium 3.7  3.7 - 5.3 mEq/L   Chloride 97  96 - 112 mEq/L   CO2 26  19 - 32 mEq/L   Glucose, Bld 148 (*) 70 - 99 mg/dL   BUN 21  6 - 23 mg/dL   Creatinine, Ser 0.81  0.50 - 1.35 mg/dL   Calcium 9.4  8.4 - 10.5 mg/dL   Total Protein 6.6  6.0 - 8.3 g/dL   Albumin 3.7  3.5 - 5.2 g/dL   AST 13  0 - 37 U/L   ALT 16  0 - 53 U/L   Alkaline Phosphatase 107  39 - 117 U/L   Total Bilirubin 0.5  0.3 - 1.2 mg/dL   GFR calc non Af Amer >90  >90 mL/min   GFR calc Af Amer >90  >90 mL/min  MAGNESIUM      Result Value Range   Magnesium 2.1  1.5 - 2.5 mg/dL  PHOSPHORUS      Result Value Range   Phosphorus 3.8  2.3 - 4.6 mg/dL  URINALYSIS, ROUTINE W REFLEX MICROSCOPIC      Result Value Range   Color, Urine YELLOW  YELLOW   APPearance CLEAR  CLEAR   Specific Gravity, Urine 1.013  1.005 - 1.030   pH 7.0   5.0 - 8.0   Glucose, UA NEGATIVE  NEGATIVE mg/dL   Hgb urine dipstick NEGATIVE  NEGATIVE   Bilirubin Urine NEGATIVE  NEGATIVE   Ketones, ur NEGATIVE  NEGATIVE mg/dL   Protein, ur NEGATIVE  NEGATIVE mg/dL   Urobilinogen, UA 0.2  0.0 - 1.0 mg/dL   Nitrite NEGATIVE  NEGATIVE   Leukocytes, UA NEGATIVE  NEGATIVE  GLUCOSE, CAPILLARY      Result Value Range   Glucose-Capillary 118 (*) 70 - 99 mg/dL   Comment 1 Notify RN    BASIC METABOLIC PANEL      Result Value Range   Sodium 135 (*) 137 - 147 mEq/L   Potassium 3.0 (*) 3.7 - 5.3 mEq/L   Chloride 97  96 - 112 mEq/L   CO2 22  19 - 32 mEq/L   Glucose, Bld 111 (*) 70 - 99 mg/dL   BUN 20  6 - 23 mg/dL   Creatinine, Ser 0.91  0.50 - 1.35 mg/dL   Calcium 8.9  8.4 - 10.5 mg/dL   GFR calc non Af Amer 87 (*) >90 mL/min   GFR calc Af Amer >90  >90 mL/min  GLUCOSE, CAPILLARY      Result Value Range   Glucose-Capillary 98  70 - 99 mg/dL   Comment 1 Documented in Chart     Comment 2 Notify RN    GLUCOSE, CAPILLARY      Result Value Range   Glucose-Capillary 127 (*) 70 - 99 mg/dL   Comment 1 Documented in Chart     Comment 2 Notify RN    URINALYSIS, ROUTINE W REFLEX MICROSCOPIC      Result Value Range   Color, Urine YELLOW  YELLOW   APPearance CLEAR  CLEAR   Specific Gravity, Urine 1.025  1.005 - 1.030   pH 6.0  5.0 - 8.0   Glucose, UA >1000 (*) NEGATIVE mg/dL   Hgb urine dipstick NEGATIVE  NEGATIVE   Bilirubin Urine NEGATIVE  NEGATIVE   Ketones, ur NEGATIVE  NEGATIVE mg/dL   Protein, ur NEGATIVE  NEGATIVE mg/dL   Urobilinogen, UA 0.2  0.0 - 1.0 mg/dL   Nitrite NEGATIVE  NEGATIVE   Leukocytes, UA NEGATIVE  NEGATIVE  CBC      Result Value Range   WBC 4.9  4.0 - 10.5 K/uL   RBC 3.39 (*) 4.22 - 5.81 MIL/uL   Hemoglobin 10.9 (*) 13.0 - 17.0 g/dL   HCT 31.4 (*) 39.0 - 52.0 %   MCV 92.6  78.0 - 100.0 fL   MCH 32.2  26.0 - 34.0 pg   MCHC 34.7  30.0 - 36.0 g/dL   RDW 15.0  11.5 - 15.5 %   Platelets 150  150 - 400 K/uL  BASIC METABOLIC  PANEL      Result Value Range   Sodium 136 (*) 137 - 147 mEq/L   Potassium 4.2  3.7 - 5.3 mEq/L   Chloride 99  96 - 112 mEq/L   CO2 24  19 - 32 mEq/L   Glucose, Bld 153 (*) 70 - 99 mg/dL   BUN 21  6 - 23 mg/dL   Creatinine, Ser 0.86  0.50 - 1.35 mg/dL   Calcium 8.5  8.4 - 10.5 mg/dL   GFR calc non Af Amer 89 (*) >90 mL/min   GFR calc Af Amer >90  >90 mL/min  GLUCOSE, CAPILLARY      Result Value Range   Glucose-Capillary 149 (*) 70 - 99 mg/dL   Comment 1 Documented in Chart     Comment 2 Notify RN    URINALYSIS, DIPSTICK ONLY      Result Value Range   Specific Gravity, Urine 1.014  1.005 - 1.030   pH 7.5  5.0 - 8.0   Glucose, UA 100 (*) NEGATIVE mg/dL   Hgb urine dipstick NEGATIVE  NEGATIVE   Bilirubin Urine NEGATIVE  NEGATIVE   Ketones, ur NEGATIVE  NEGATIVE mg/dL   Protein, ur NEGATIVE  NEGATIVE mg/dL   Urobilinogen, UA 0.2  0.0 - 1.0 mg/dL   Nitrite NEGATIVE  NEGATIVE   Leukocytes, UA NEGATIVE  NEGATIVE  GLUCOSE, CAPILLARY      Result Value Range   Glucose-Capillary 187 (*) 70 - 99 mg/dL   Comment 1 Notify RN    URINE MICROSCOPIC-ADD ON      Result Value Range   RBC / HPF 0-2  <3 RBC/hpf   Bacteria, UA RARE  RARE  GLUCOSE, CAPILLARY      Result Value Range   Glucose-Capillary 147 (*) 70 - 99 mg/dL   Comment 1 Documented in Chart     Comment 2 Notify RN    URINALYSIS, DIPSTICK ONLY      Result Value Range   Specific Gravity, Urine 1.020  1.005 - 1.030   pH 6.5  5.0 - 8.0   Glucose, UA >1000 (*) NEGATIVE mg/dL   Hgb urine dipstick NEGATIVE  NEGATIVE   Bilirubin Urine NEGATIVE  NEGATIVE   Ketones, ur NEGATIVE  NEGATIVE mg/dL   Protein, ur NEGATIVE  NEGATIVE mg/dL   Urobilinogen, UA 0.2  0.0 - 1.0 mg/dL   Nitrite NEGATIVE  NEGATIVE   Leukocytes, UA NEGATIVE  NEGATIVE  URINALYSIS, DIPSTICK ONLY      Result Value Range   Specific Gravity, Urine 1.023  1.005 - 1.030   pH 6.5  5.0 - 8.0   Glucose, UA >1000 (*) NEGATIVE mg/dL   Hgb urine dipstick NEGATIVE  NEGATIVE    Bilirubin Urine NEGATIVE  NEGATIVE   Ketones, ur NEGATIVE  NEGATIVE mg/dL  Protein, ur NEGATIVE  NEGATIVE mg/dL   Urobilinogen, UA 0.2  0.0 - 1.0 mg/dL   Nitrite NEGATIVE  NEGATIVE   Leukocytes, UA NEGATIVE  NEGATIVE  URINALYSIS, DIPSTICK ONLY      Result Value Range   Specific Gravity, Urine 1.028  1.005 - 1.030   pH 6.5  5.0 - 8.0   Glucose, UA >1000 (*) NEGATIVE mg/dL   Hgb urine dipstick NEGATIVE  NEGATIVE   Bilirubin Urine NEGATIVE  NEGATIVE   Ketones, ur NEGATIVE  NEGATIVE mg/dL   Protein, ur NEGATIVE  NEGATIVE mg/dL   Urobilinogen, UA 1.0  0.0 - 1.0 mg/dL   Nitrite NEGATIVE  NEGATIVE   Leukocytes, UA NEGATIVE  NEGATIVE  GLUCOSE, CAPILLARY      Result Value Range   Glucose-Capillary 190 (*) 70 - 99 mg/dL   Comment 1 Documented in Chart     Comment 2 Notify RN    GLUCOSE, CAPILLARY      Result Value Range   Glucose-Capillary 166 (*) 70 - 99 mg/dL   Comment 1 Documented in Chart     Comment 2 Notify RN    CBC      Result Value Range   WBC 6.8  4.0 - 10.5 K/uL   RBC 3.22 (*) 4.22 - 5.81 MIL/uL   Hemoglobin 10.5 (*) 13.0 - 17.0 g/dL   HCT 29.8 (*) 39.0 - 52.0 %   MCV 92.5  78.0 - 100.0 fL   MCH 32.6  26.0 - 34.0 pg   MCHC 35.2  30.0 - 36.0 g/dL   RDW 14.9  11.5 - 15.5 %   Platelets 126 (*) 150 - 400 K/uL  BASIC METABOLIC PANEL      Result Value Range   Sodium 137  137 - 147 mEq/L   Potassium 3.5 (*) 3.7 - 5.3 mEq/L   Chloride 97  96 - 112 mEq/L   CO2 29  19 - 32 mEq/L   Glucose, Bld 134 (*) 70 - 99 mg/dL   BUN 19  6 - 23 mg/dL   Creatinine, Ser 0.78  0.50 - 1.35 mg/dL   Calcium 8.6  8.4 - 10.5 mg/dL   GFR calc non Af Amer >90  >90 mL/min   GFR calc Af Amer >90  >90 mL/min  GLUCOSE, CAPILLARY      Result Value Range   Glucose-Capillary 213 (*) 70 - 99 mg/dL   Comment 1 Documented in Chart     Comment 2 Notify RN    URINALYSIS, DIPSTICK ONLY      Result Value Range   Specific Gravity, Urine 1.025  1.005 - 1.030   pH 7.0  5.0 - 8.0   Glucose, UA >1000  (*) NEGATIVE mg/dL   Hgb urine dipstick NEGATIVE  NEGATIVE   Bilirubin Urine NEGATIVE  NEGATIVE   Ketones, ur NEGATIVE  NEGATIVE mg/dL   Protein, ur NEGATIVE  NEGATIVE mg/dL   Urobilinogen, UA 0.2  0.0 - 1.0 mg/dL   Nitrite NEGATIVE  NEGATIVE   Leukocytes, UA NEGATIVE  NEGATIVE  URINALYSIS, DIPSTICK ONLY      Result Value Range   Specific Gravity, Urine 1.027  1.005 - 1.030   pH 6.5  5.0 - 8.0   Glucose, UA >1000 (*) NEGATIVE mg/dL   Hgb urine dipstick NEGATIVE  NEGATIVE   Bilirubin Urine NEGATIVE  NEGATIVE   Ketones, ur NEGATIVE  NEGATIVE mg/dL   Protein, ur NEGATIVE  NEGATIVE mg/dL   Urobilinogen, UA 0.2  0.0 -  1.0 mg/dL   Nitrite NEGATIVE  NEGATIVE   Leukocytes, UA NEGATIVE  NEGATIVE  GLUCOSE, CAPILLARY      Result Value Range   Glucose-Capillary 186 (*) 70 - 99 mg/dL   Comment 1 Notify RN    URINALYSIS, DIPSTICK ONLY      Result Value Range   Specific Gravity, Urine 1.012  1.005 - 1.030   pH 8.0  5.0 - 8.0   Glucose, UA 100 (*) NEGATIVE mg/dL   Hgb urine dipstick NEGATIVE  NEGATIVE   Bilirubin Urine NEGATIVE  NEGATIVE   Ketones, ur NEGATIVE  NEGATIVE mg/dL   Protein, ur NEGATIVE  NEGATIVE mg/dL   Urobilinogen, UA 0.2  0.0 - 1.0 mg/dL   Nitrite NEGATIVE  NEGATIVE   Leukocytes, UA NEGATIVE  NEGATIVE  URINALYSIS, DIPSTICK ONLY      Result Value Range   Specific Gravity, Urine 1.010  1.005 - 1.030   pH 8.5 (*) 5.0 - 8.0   Glucose, UA NEGATIVE  NEGATIVE mg/dL   Hgb urine dipstick NEGATIVE  NEGATIVE   Bilirubin Urine NEGATIVE  NEGATIVE   Ketones, ur NEGATIVE  NEGATIVE mg/dL   Protein, ur NEGATIVE  NEGATIVE mg/dL   Urobilinogen, UA 0.2  0.0 - 1.0 mg/dL   Nitrite NEGATIVE  NEGATIVE   Leukocytes, UA NEGATIVE  NEGATIVE  URINALYSIS, DIPSTICK ONLY      Result Value Range   Specific Gravity, Urine 1.011  1.005 - 1.030   pH 8.0  5.0 - 8.0   Glucose, UA 100 (*) NEGATIVE mg/dL   Hgb urine dipstick NEGATIVE  NEGATIVE   Bilirubin Urine NEGATIVE  NEGATIVE   Ketones, ur  NEGATIVE  NEGATIVE mg/dL   Protein, ur NEGATIVE  NEGATIVE mg/dL   Urobilinogen, UA 0.2  0.0 - 1.0 mg/dL   Nitrite NEGATIVE  NEGATIVE   Leukocytes, UA NEGATIVE  NEGATIVE  GLUCOSE, CAPILLARY      Result Value Range   Glucose-Capillary 119 (*) 70 - 99 mg/dL  URINALYSIS, DIPSTICK ONLY      Result Value Range   Specific Gravity, Urine 1.011  1.005 - 1.030   pH 8.0  5.0 - 8.0   Glucose, UA 100 (*) NEGATIVE mg/dL   Hgb urine dipstick NEGATIVE  NEGATIVE   Bilirubin Urine NEGATIVE  NEGATIVE   Ketones, ur NEGATIVE  NEGATIVE mg/dL   Protein, ur NEGATIVE  NEGATIVE mg/dL   Urobilinogen, UA 0.2  0.0 - 1.0 mg/dL   Nitrite NEGATIVE  NEGATIVE   Leukocytes, UA NEGATIVE  NEGATIVE  GLUCOSE, CAPILLARY      Result Value Range   Glucose-Capillary 95  70 - 99 mg/dL  URINALYSIS, DIPSTICK ONLY      Result Value Range   Specific Gravity, Urine 1.011  1.005 - 1.030   pH 8.5 (*) 5.0 - 8.0   Glucose, UA NEGATIVE  NEGATIVE mg/dL   Hgb urine dipstick NEGATIVE  NEGATIVE   Bilirubin Urine NEGATIVE  NEGATIVE   Ketones, ur NEGATIVE  NEGATIVE mg/dL   Protein, ur NEGATIVE  NEGATIVE mg/dL   Urobilinogen, UA 0.2  0.0 - 1.0 mg/dL   Nitrite NEGATIVE  NEGATIVE   Leukocytes, UA NEGATIVE  NEGATIVE  CBC      Result Value Range   WBC 5.4  4.0 - 10.5 K/uL   RBC 3.32 (*) 4.22 - 5.81 MIL/uL   Hemoglobin 10.9 (*) 13.0 - 17.0 g/dL   HCT 31.1 (*) 39.0 - 52.0 %   MCV 93.7  78.0 - 100.0 fL  MCH 32.8  26.0 - 34.0 pg   MCHC 35.0  30.0 - 36.0 g/dL   RDW 15.3  11.5 - 15.5 %   Platelets 102 (*) 150 - 400 K/uL  BASIC METABOLIC PANEL      Result Value Range   Sodium 139  137 - 147 mEq/L   Potassium 2.9 (*) 3.7 - 5.3 mEq/L   Chloride 95 (*) 96 - 112 mEq/L   CO2 31  19 - 32 mEq/L   Glucose, Bld 93  70 - 99 mg/dL   BUN 15  6 - 23 mg/dL   Creatinine, Ser 0.82  0.50 - 1.35 mg/dL   Calcium 8.4  8.4 - 10.5 mg/dL   GFR calc non Af Amer >90  >90 mL/min   GFR calc Af Amer >90  >90 mL/min  GLUCOSE, CAPILLARY      Result Value  Range   Glucose-Capillary 107 (*) 70 - 99 mg/dL   Comment 1 Notify RN     Comment 2 Documented in Chart    URINALYSIS, DIPSTICK ONLY      Result Value Range   Specific Gravity, Urine 1.010  1.005 - 1.030   pH 8.5 (*) 5.0 - 8.0   Glucose, UA NEGATIVE  NEGATIVE mg/dL   Hgb urine dipstick NEGATIVE  NEGATIVE   Bilirubin Urine NEGATIVE  NEGATIVE   Ketones, ur NEGATIVE  NEGATIVE mg/dL   Protein, ur NEGATIVE  NEGATIVE mg/dL   Urobilinogen, UA 0.2  0.0 - 1.0 mg/dL   Nitrite NEGATIVE  NEGATIVE   Leukocytes, UA NEGATIVE  NEGATIVE  GLUCOSE, CAPILLARY      Result Value Range   Glucose-Capillary 163 (*) 70 - 99 mg/dL   Comment 1 Notify RN     Comment 2 Documented in Chart    URINALYSIS, DIPSTICK ONLY      Result Value Range   Specific Gravity, Urine 1.012  1.005 - 1.030   pH 8.5 (*) 5.0 - 8.0   Glucose, UA NEGATIVE  NEGATIVE mg/dL   Hgb urine dipstick NEGATIVE  NEGATIVE   Bilirubin Urine NEGATIVE  NEGATIVE   Ketones, ur NEGATIVE  NEGATIVE mg/dL   Protein, ur NEGATIVE  NEGATIVE mg/dL   Urobilinogen, UA 0.2  0.0 - 1.0 mg/dL   Nitrite NEGATIVE  NEGATIVE   Leukocytes, UA NEGATIVE  NEGATIVE  URINALYSIS, DIPSTICK ONLY      Result Value Range   Specific Gravity, Urine 1.014  1.005 - 1.030   pH 8.5 (*) 5.0 - 8.0   Glucose, UA 250 (*) NEGATIVE mg/dL   Hgb urine dipstick NEGATIVE  NEGATIVE   Bilirubin Urine NEGATIVE  NEGATIVE   Ketones, ur NEGATIVE  NEGATIVE mg/dL   Protein, ur 30 (*) NEGATIVE mg/dL   Urobilinogen, UA 0.2  0.0 - 1.0 mg/dL   Nitrite NEGATIVE  NEGATIVE   Leukocytes, UA NEGATIVE  NEGATIVE  URINALYSIS, DIPSTICK ONLY      Result Value Range   Specific Gravity, Urine 1.015  1.005 - 1.030   pH 7.5  5.0 - 8.0   Glucose, UA >1000 (*) NEGATIVE mg/dL   Hgb urine dipstick NEGATIVE  NEGATIVE   Bilirubin Urine NEGATIVE  NEGATIVE   Ketones, ur NEGATIVE  NEGATIVE mg/dL   Protein, ur 30 (*) NEGATIVE mg/dL   Urobilinogen, UA 0.2  0.0 - 1.0 mg/dL   Nitrite NEGATIVE  NEGATIVE    Leukocytes, UA NEGATIVE  NEGATIVE  GLUCOSE, CAPILLARY      Result Value Range   Glucose-Capillary 95  70 - 99 mg/dL  URINALYSIS, DIPSTICK ONLY      Result Value Range   Specific Gravity, Urine 1.017  1.005 - 1.030   pH 7.5  5.0 - 8.0   Glucose, UA >1000 (*) NEGATIVE mg/dL   Hgb urine dipstick NEGATIVE  NEGATIVE   Bilirubin Urine NEGATIVE  NEGATIVE   Ketones, ur NEGATIVE  NEGATIVE mg/dL   Protein, ur 30 (*) NEGATIVE mg/dL   Urobilinogen, UA 0.2  0.0 - 1.0 mg/dL   Nitrite NEGATIVE  NEGATIVE   Leukocytes, UA NEGATIVE  NEGATIVE  GLUCOSE, CAPILLARY      Result Value Range   Glucose-Capillary 62 (*) 70 - 99 mg/dL   Comment 1 Documented in Chart     Comment 2 Notify RN    GLUCOSE, CAPILLARY      Result Value Range   Glucose-Capillary 85  70 - 99 mg/dL   Comment 1 Documented in Chart     Comment 2 Notify RN    URINALYSIS, DIPSTICK ONLY      Result Value Range   Specific Gravity, Urine 1.018  1.005 - 1.030   pH 7.5  5.0 - 8.0   Glucose, UA 500 (*) NEGATIVE mg/dL   Hgb urine dipstick NEGATIVE  NEGATIVE   Bilirubin Urine NEGATIVE  NEGATIVE   Ketones, ur NEGATIVE  NEGATIVE mg/dL   Protein, ur 30 (*) NEGATIVE mg/dL   Urobilinogen, UA 0.2  0.0 - 1.0 mg/dL   Nitrite NEGATIVE  NEGATIVE   Leukocytes, UA NEGATIVE  NEGATIVE  CBC      Result Value Range   WBC 6.9  4.0 - 10.5 K/uL   RBC 3.53 (*) 4.22 - 5.81 MIL/uL   Hemoglobin 11.3 (*) 13.0 - 17.0 g/dL   HCT 32.4 (*) 39.0 - 52.0 %   MCV 91.8  78.0 - 100.0 fL   MCH 32.0  26.0 - 34.0 pg   MCHC 34.9  30.0 - 36.0 g/dL   RDW 14.5  11.5 - 15.5 %   Platelets 118 (*) 150 - 400 K/uL  BASIC METABOLIC PANEL      Result Value Range   Sodium 135 (*) 137 - 147 mEq/L   Potassium 3.9  3.7 - 5.3 mEq/L   Chloride 96  96 - 112 mEq/L   CO2 24  19 - 32 mEq/L   Glucose, Bld 193 (*) 70 - 99 mg/dL   BUN 17  6 - 23 mg/dL   Creatinine, Ser 0.84  0.50 - 1.35 mg/dL   Calcium 8.6  8.4 - 10.5 mg/dL   GFR calc non Af Amer 90 (*) >90 mL/min   GFR calc Af Amer  >90  >90 mL/min  GLUCOSE, CAPILLARY      Result Value Range   Glucose-Capillary 193 (*) 70 - 99 mg/dL   Comment 1 Documented in Chart     Comment 2 Notify RN    URINALYSIS, DIPSTICK ONLY      Result Value Range   Specific Gravity, Urine 1.014  1.005 - 1.030   pH 8.0  5.0 - 8.0   Glucose, UA 250 (*) NEGATIVE mg/dL   Hgb urine dipstick NEGATIVE  NEGATIVE   Bilirubin Urine NEGATIVE  NEGATIVE   Ketones, ur NEGATIVE  NEGATIVE mg/dL   Protein, ur 30 (*) NEGATIVE mg/dL   Urobilinogen, UA 0.2  0.0 - 1.0 mg/dL   Nitrite NEGATIVE  NEGATIVE   Leukocytes, UA NEGATIVE  NEGATIVE  GLUCOSE, CAPILLARY      Result Value  Range   Glucose-Capillary 223 (*) 70 - 99 mg/dL   Comment 1 Notify RN    URINALYSIS, DIPSTICK ONLY      Result Value Range   Specific Gravity, Urine 1.016  1.005 - 1.030   pH 8.0  5.0 - 8.0   Glucose, UA 500 (*) NEGATIVE mg/dL   Hgb urine dipstick NEGATIVE  NEGATIVE   Bilirubin Urine NEGATIVE  NEGATIVE   Ketones, ur NEGATIVE  NEGATIVE mg/dL   Protein, ur 30 (*) NEGATIVE mg/dL   Urobilinogen, UA 0.2  0.0 - 1.0 mg/dL   Nitrite NEGATIVE  NEGATIVE   Leukocytes, UA NEGATIVE  NEGATIVE  GLUCOSE, CAPILLARY      Result Value Range   Glucose-Capillary 175 (*) 70 - 99 mg/dL   A/P: Pt with history of NHL with CNS involvement/carcinomatous meningitis. Pt is s/p right frontal Ommaya reservoir placement on 1/26 by Dr. Arnoldo Morale. 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 Plan is for CT guided bone marrow biopsy on 1/30. Details/risks of procedure d/w pt/wife with their understanding and consent.

## 2013-03-17 NOTE — Progress Notes (Signed)
PT Cancellation Note  Patient Details Name: Keith Duffy MRN: [id number] DOB: [birth year]   Cancelled Treatment:    Reason Eval/Treat Not Completed: Medical issues which prohibited therapy (pt reports he is on 24 hour chemo and feels too sick. will check back tomorrow.)   Claretha Cooper 03/17/2013, 11:58 AM

## 2013-03-17 NOTE — Progress Notes (Signed)
Inpatient Diabetes Program Recommendations  AACE/ADA: New Consensus Statement on Inpatient Glycemic Control (2013)  Target Ranges:  Prepandial:   less than 140 mg/dL      Peak postprandial:   less than 180 mg/dL (1-2 hours)      Critically ill patients:  140 - 180 mg/dL   Results for Keith Duffy, Keith Duffy (MRN [id number]) as of 03/17/2013 12:02  Ref. Range 03/16/2013 08:05 03/16/2013 12:27 03/16/2013 12:48 03/16/2013 18:04 03/16/2013 21:34 03/17/2013 07:40 03/17/2013 11:30  Glucose-Capillary Latest Range: 70-99 mg/dL 95 62 (L) 85 193 (H) 223 (H) 175 (H) 174 (H)   Diabetes history: DM2 Outpatient Diabetes medications: Lantus 25 units QHS and Novolog 0-9 TID with meals Current orders for Inpatient glycemic control: Lantus 25 units QHS, Novolog 0-20 units TID with meals, and Novolog 6 units TID with meals for meal coverage  Inpatient Diabetes Program Recommendations Diet: May want to change diet from Regular to Carb Modified Diabetic diet. A1C: Please consider ordering an A1C to evaluate glycemic control over the past 2-3 months.  Last A1C in the chart was 8.9% on 08/26/12.   Note: Noted patient did not receive Decadron 40 mg on 1/26 and noted CBG of 62 mg/dl on 1/27 at 12:27.  Would recommend continuing with current orders for glycemic control at this time since Decadron 40 mg is now ordered Q24H.  MD may want to change diet to Carb Modified and order an A1C.  Will continue to follow.  Thanks, Barnie Alderman, RN, MSN, CCRN Diabetes Coordinator Inpatient Diabetes Program [phone] (Team Pager) [phone] ([facility] office) [phone] [facility] office)

## 2013-03-18 LAB — URINALYSIS, DIPSTICK ONLY
BILIRUBIN URINE: NEGATIVE
BILIRUBIN URINE: NEGATIVE
Bilirubin Urine: NEGATIVE
Bilirubin Urine: NEGATIVE
Bilirubin Urine: NEGATIVE
Bilirubin Urine: NEGATIVE
GLUCOSE, UA: 250 mg/dL — AB
Glucose, UA: NEGATIVE mg/dL
Glucose, UA: NEGATIVE mg/dL
Glucose, UA: 500 mg/dL — AB
Glucose, UA: 250 mg/dL — AB
Glucose, UA: 500 mg/dL — AB
HGB URINE DIPSTICK: NEGATIVE
HGB URINE DIPSTICK: NEGATIVE
HGB URINE DIPSTICK: NEGATIVE
HGB URINE DIPSTICK: NEGATIVE
Hgb urine dipstick: NEGATIVE
Hgb urine dipstick: NEGATIVE
KETONES UR: NEGATIVE mg/dL
KETONES UR: NEGATIVE mg/dL
KETONES UR: NEGATIVE mg/dL
KETONES UR: NEGATIVE mg/dL
KETONES UR: NEGATIVE mg/dL
Ketones, ur: NEGATIVE mg/dL
LEUKOCYTES UA: NEGATIVE
LEUKOCYTES UA: NEGATIVE
Leukocytes, UA: NEGATIVE
Leukocytes, UA: NEGATIVE
Leukocytes, UA: NEGATIVE
Leukocytes, UA: NEGATIVE
NITRITE: NEGATIVE
Nitrite: NEGATIVE
Nitrite: NEGATIVE
Nitrite: NEGATIVE
Nitrite: NEGATIVE
Nitrite: NEGATIVE
PH: 7 (ref 5.0–8.0)
PROTEIN: NEGATIVE mg/dL
PROTEIN: NEGATIVE mg/dL
PROTEIN: NEGATIVE mg/dL
PROTEIN: NEGATIVE mg/dL
PROTEIN: NEGATIVE mg/dL
PROTEIN: NEGATIVE mg/dL
Specific Gravity, Urine: 1.012 (ref 1.005–1.030)
Specific Gravity, Urine: 1.012 (ref 1.005–1.030)
Specific Gravity, Urine: 1.013 (ref 1.005–1.030)
Specific Gravity, Urine: 1.013 (ref 1.005–1.030)
Specific Gravity, Urine: 1.014 (ref 1.005–1.030)
Specific Gravity, Urine: 1.015 (ref 1.005–1.030)
UROBILINOGEN UA: 0.2 mg/dL (ref 0.0–1.0)
UROBILINOGEN UA: 1 mg/dL (ref 0.0–1.0)
Urobilinogen, UA: 0.2 mg/dL (ref 0.0–1.0)
Urobilinogen, UA: 0.2 mg/dL (ref 0.0–1.0)
Urobilinogen, UA: 1 mg/dL (ref 0.0–1.0)
Urobilinogen, UA: 1 mg/dL (ref 0.0–1.0)
pH: 7.5 (ref 5.0–8.0)
pH: 8 (ref 5.0–8.0)
pH: 8 (ref 5.0–8.0)
pH: 8 (ref 5.0–8.0)
pH: 8.5 — ABNORMAL HIGH (ref 5.0–8.0)

## 2013-03-18 LAB — BASIC METABOLIC PANEL
BUN: 22 mg/dL (ref 6–23)
CALCIUM: 8.7 mg/dL (ref 8.4–10.5)
CO2: 27 meq/L (ref 19–32)
CREATININE: 0.92 mg/dL (ref 0.50–1.35)
Chloride: 98 mEq/L (ref 96–112)
GFR, EST NON AFRICAN AMERICAN: 87 mL/min — AB (ref >90)
Glucose, Bld: 100 mg/dL — ABNORMAL HIGH (ref 70–99)
Potassium: 4 mEq/L (ref 3.7–5.3)
SODIUM: 135 meq/L — AB (ref 137–147)

## 2013-03-18 LAB — CBC
HCT: 32.7 % — ABNORMAL LOW (ref 39.0–52.0)
Hemoglobin: 11.2 g/dL — ABNORMAL LOW (ref 13.0–17.0)
MCH: 31.8 pg (ref 26.0–34.0)
MCHC: 34.3 g/dL (ref 30.0–36.0)
MCV: 92.9 fL (ref 78.0–100.0)
PLATELETS: 140 10*3/uL — AB (ref 150–400)
RBC: 3.52 MIL/uL — ABNORMAL LOW (ref 4.22–5.81)
RDW: 14.9 % (ref 11.5–15.5)
WBC: 10.7 10*3/uL — ABNORMAL HIGH (ref 4.0–10.5)

## 2013-03-18 LAB — GLUCOSE, CAPILLARY
GLUCOSE-CAPILLARY: 114 mg/dL — AB (ref 70–99)
GLUCOSE-CAPILLARY: 150 mg/dL — AB (ref 70–99)
Glucose-Capillary: 146 mg/dL — ABNORMAL HIGH (ref 70–99)
Glucose-Capillary: 181 mg/dL — ABNORMAL HIGH (ref 70–99)

## 2013-03-18 LAB — METHOTREXATE: METHOTREXATE: 15

## 2013-03-18 MED ORDER — ENOXAPARIN SODIUM 40 MG/0.4ML ~~LOC~~ SOLN
40.0000 mg | SUBCUTANEOUS | Status: DC
  Administered 2013-03-18 – 2013-03-21 (×4): 40 mg via SUBCUTANEOUS
  Filled 2013-03-18 (×5): qty 0.4

## 2013-03-18 MED ORDER — LEUCOVORIN CALCIUM INJECTION 100 MG
15.0000 mg/m2 | Freq: Four times a day (QID) | INTRAMUSCULAR | Status: DC
  Filled 2013-03-18: qty 1.4

## 2013-03-18 NOTE — Progress Notes (Signed)
The methotrexate infusion has completed. He started the leucovorin this morning. She will get this every 3 hours. We will check his methotrexate level tomorrow.  He may have his bone marrow test today.  His blood sugars have been a little on the high side. I'm not too worried about this. I do appreciate the diabetes coordinators help [place] out.  His have a little diarrhea. This is  getting better.  He's had no bleeding. He's had no headache. He's had no swallowing difficulties.  Is urinating well. His urine is still basic.  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  Hopefully, he will be able to do physical therapy today.  We cannot do the PET scan as an inpatient. I appreciate radiology trying to do this for [place].  On his physical exam, his blood pressure is 163/92. I am really not too worried about this. Temperature is 98.1. Oral exam is unremarkable. There is no mucositis. Lungs are clear. Cardiac exam regular rate and rhythm with no murmurs rubs or bruits. Abdomen is soft. Has good bowel sounds. There is no fluid wave. There is no palpable hepatospleno megaly.  extremities shows continued weakness in his legs. This is symmetric and chronic. Neurological exam shows the stable right cranial nerve 7 palsy left foot drop.  We will now await the methotrexate to get down below are cut off level. I told him that this likely will be Sunday. Once he gets below 0.05, then we can see about let him go home.  While he is hospitalized, I will try to do the Ommaya reservoir installation of liposomal araC.  Harriette Ohara 17:14

## 2013-03-18 NOTE — Evaluation (Signed)
Physical Therapy Evaluation Patient Details Name: Keith Duffy MRN: [id number] DOB: 05-05-1945 Today's Date: 03/18/2013 Time: 1040-4591 PT Time Calculation (min): 16 min  PT Assessment / Plan / Recommendation History of Present Illness  Pt with history of NHL with CNS involvement/carcinomatous meningitis. Pt is s/p right frontal Ommaya reservoir placement on 1/26 by Dr. Lovell Sheehan. 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 Plan is for CT guided bone marrow biopsy on 1/30.  Clinical Impression  Pt admitted with NHL. Pt currently with functional limitations due to the deficits listed below (see PT Problem List).  Pt will benefit from skilled PT to increase their independence and safety with mobility to allow discharge to the venue listed below.  Pt reports L foot drop since July last year and has not improved so used ace wrap to provide L DF during ambulation which pt reports more comfortable and confident walking and improvement in gait pattern observed.  Pt would benefit from prosthetist referral for L AFO to assist with improvement in gait pattern and safety.       PT Assessment  Patient needs continued PT services    Follow Up Recommendations  Home health PT    Does the patient have the potential to tolerate intense rehabilitation      Barriers to Discharge        Equipment Recommendations  Other (comment) (pt states he has access to RW)    Recommendations for Other Services     Frequency Min 3X/week    Precautions / Restrictions Precautions Precautions: Fall Precaution Comments: L foot drop   Pertinent Vitals/Pain Only c/o feeling rough however agreeable to therapy to tolerance      Mobility  Bed Mobility Overal bed mobility: Modified Independent Transfers Overall transfer level: Needs assistance Equipment used: Rolling walker (2 wheeled) Transfers: Sit to/from Stand Sit to Stand: Min guard General transfer comment: verbal cues for hand  placement Ambulation/Gait Ambulation/Gait assistance: Min guard Ambulation Distance (Feet): 80 Feet Assistive device: Rolling walker (2 wheeled) Gait Pattern/deviations: Step-through pattern;Decreased dorsiflexion - left General Gait Details: pt ambulated in room and observed increased hip/knee flexion to compensate for L foot drop, applied ace wrap to L ankle to maintain more DF (like an AFO) and had pt ambulate in hallway with RW with much improvement in gait pattern and pt reports he feels more comfortable walking    Exercises     PT Diagnosis: Abnormality of gait  PT Problem List: Decreased balance;Decreased mobility;Decreased knowledge of use of DME PT Treatment Interventions: DME instruction;Gait training;Functional mobility training;Therapeutic activities;Therapeutic exercise;Patient/family education;Neuromuscular re-education;Balance training     PT Goals(Current goals can be found in the care plan section) Acute Rehab PT Goals PT Goal Formulation: With patient Time For Goal Achievement: 03/25/13 Potential to Achieve Goals: Good  Visit Information  Last PT Received On: 03/18/13 Assistance Needed: +1 History of Present Illness: Pt with history of NHL with CNS involvement/carcinomatous meningitis. Pt is s/p right frontal Ommaya reservoir placement on 1/26 by Dr. Lovell Sheehan. 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 Plan is for CT guided bone marrow biopsy on 1/30.       Prior Functioning  Home Living Family/patient expects to be discharged to:: Private residence Living Arrangements: Spouse/significant other Available Help at Discharge: Family;Available 24 hours/day Type of Home: House Home Layout: One level Home Equipment: None Prior Function Level of Independence: Independent Comments: recently has been furniture walking or having spouse assist, spouse reports poor  balance Communication Communication: No difficulties    Cognition   Cognition Arousal/Alertness: Awake/alert Behavior During Therapy: WFL for tasks assessed/performed Overall Cognitive Status: Within Functional Limits for tasks assessed    Extremity/Trunk Assessment Lower Extremity Assessment Lower Extremity Assessment: LLE deficits/detail LLE Deficits / Details: L foot drop, unable to perform DF against gravity LLE Sensation: history of peripheral neuropathy (per pt)   Balance    End of Session PT - End of Session Activity Tolerance: Patient tolerated treatment well Patient left: in bed;with family/visitor present Nurse Communication: Mobility status  GP     Cresencio Reesor,KATHrine E 03/18/2013, 11:56 AM Carmelia Bake, PT, DPT 03/18/2013 Pager: [phone]

## 2013-03-19 ENCOUNTER — Ambulatory Visit (HOSPITAL_COMMUNITY): Payer: Medicare Other

## 2013-03-19 LAB — BASIC METABOLIC PANEL
BUN: 20 mg/dL (ref 6–23)
CALCIUM: 8.4 mg/dL (ref 8.4–10.5)
CO2: 25 mEq/L (ref 19–32)
CREATININE: 0.89 mg/dL (ref 0.50–1.35)
Chloride: 98 mEq/L (ref 96–112)
GFR calc Af Amer: >90 mL/min (ref >90)
GFR, EST NON AFRICAN AMERICAN: 88 mL/min — AB (ref >90)
GLUCOSE: 112 mg/dL — AB (ref 70–99)
Potassium: 4.1 mEq/L (ref 3.7–5.3)
SODIUM: 135 meq/L — AB (ref 137–147)

## 2013-03-19 LAB — URINALYSIS, DIPSTICK ONLY
BILIRUBIN URINE: NEGATIVE
Bilirubin Urine: NEGATIVE
Bilirubin Urine: NEGATIVE
Bilirubin Urine: NEGATIVE
Bilirubin Urine: NEGATIVE
GLUCOSE, UA: NEGATIVE mg/dL
GLUCOSE, UA: 250 mg/dL — AB
Glucose, UA: 100 mg/dL — AB
Glucose, UA: NEGATIVE mg/dL
Glucose, UA: NEGATIVE mg/dL
Hgb urine dipstick: NEGATIVE
Hgb urine dipstick: NEGATIVE
Hgb urine dipstick: NEGATIVE
Hgb urine dipstick: NEGATIVE
Hgb urine dipstick: NEGATIVE
KETONES UR: NEGATIVE mg/dL
KETONES UR: NEGATIVE mg/dL
KETONES UR: NEGATIVE mg/dL
Ketones, ur: NEGATIVE mg/dL
Ketones, ur: NEGATIVE mg/dL
LEUKOCYTES UA: NEGATIVE
LEUKOCYTES UA: NEGATIVE
Leukocytes, UA: NEGATIVE
Leukocytes, UA: NEGATIVE
Leukocytes, UA: NEGATIVE
NITRITE: NEGATIVE
Nitrite: NEGATIVE
Nitrite: NEGATIVE
Nitrite: NEGATIVE
Nitrite: NEGATIVE
PH: 8 (ref 5.0–8.0)
PH: 8 (ref 5.0–8.0)
PH: 6.5 (ref 5.0–8.0)
PROTEIN: NEGATIVE mg/dL
Protein, ur: NEGATIVE mg/dL
Protein, ur: NEGATIVE mg/dL
Protein, ur: NEGATIVE mg/dL
Protein, ur: NEGATIVE mg/dL
SPECIFIC GRAVITY, URINE: 1.01 (ref 1.005–1.030)
SPECIFIC GRAVITY, URINE: 1.012 (ref 1.005–1.030)
SPECIFIC GRAVITY, URINE: 1.014 (ref 1.005–1.030)
Specific Gravity, Urine: 1.011 (ref 1.005–1.030)
Specific Gravity, Urine: 1.011 (ref 1.005–1.030)
UROBILINOGEN UA: 0.2 mg/dL (ref 0.0–1.0)
UROBILINOGEN UA: 1 mg/dL (ref 0.0–1.0)
Urobilinogen, UA: 0.2 mg/dL (ref 0.0–1.0)
Urobilinogen, UA: 0.2 mg/dL (ref 0.0–1.0)
Urobilinogen, UA: 0.2 mg/dL (ref 0.0–1.0)
pH: 7.5 (ref 5.0–8.0)
pH: 8 (ref 5.0–8.0)

## 2013-03-19 LAB — GLUCOSE, CAPILLARY
GLUCOSE-CAPILLARY: 94 mg/dL (ref 70–99)
GLUCOSE-CAPILLARY: 193 mg/dL — AB (ref 70–99)
Glucose-Capillary: 106 mg/dL — ABNORMAL HIGH (ref 70–99)
Glucose-Capillary: 87 mg/dL (ref 70–99)
Glucose-Capillary: 141 mg/dL — ABNORMAL HIGH (ref 70–99)

## 2013-03-19 LAB — BONE MARROW EXAM

## 2013-03-19 LAB — CBC
HCT: 31.2 % — ABNORMAL LOW (ref 39.0–52.0)
HEMOGLOBIN: 10.8 g/dL — AB (ref 13.0–17.0)
MCH: 32.4 pg (ref 26.0–34.0)
MCHC: 34.6 g/dL (ref 30.0–36.0)
MCV: 93.7 fL (ref 78.0–100.0)
Platelets: 126 10*3/uL — ABNORMAL LOW (ref 150–400)
RBC: 3.33 MIL/uL — ABNORMAL LOW (ref 4.22–5.81)
RDW: 15.1 % (ref 11.5–15.5)
WBC: 8.5 10*3/uL (ref 4.0–10.5)

## 2013-03-19 LAB — PROTIME-INR
INR: 0.98 (ref 0.00–1.49)
Prothrombin Time: 12.8 seconds (ref 11.6–15.2)

## 2013-03-19 LAB — APTT: aPTT: 26 seconds (ref 24–37)

## 2013-03-19 LAB — METHOTREXATE: Methotrexate: 0.94

## 2013-03-19 MED ORDER — LEUCOVORIN CALCIUM INJECTION 100 MG
15.0000 mg/m2 | Freq: Four times a day (QID) | INTRAMUSCULAR | Status: DC
  Administered 2013-03-19 – 2013-03-21 (×9): 28 mg via INTRAVENOUS
  Filled 2013-03-19 (×17): qty 1.4

## 2013-03-19 MED ORDER — FENTANYL CITRATE 0.05 MG/ML IJ SOLN
INTRAMUSCULAR | Status: AC | PRN
  Administered 2013-03-19 (×2): 50 ug via INTRAVENOUS

## 2013-03-19 MED ORDER — HYDROCORTISONE SOD SUCCINATE 100 MG PF FOR IT USE: Freq: Once | INTRATHECAL | Status: DC

## 2013-03-19 MED ORDER — PROCHLORPERAZINE EDISYLATE 5 MG/ML IJ SOLN
10.0000 mg | Freq: Once | INTRAMUSCULAR | Status: AC
  Administered 2013-03-20: 10 mg via INTRAVENOUS
  Filled 2013-03-19: qty 2

## 2013-03-19 MED ORDER — CYTARABINE LIPOSOME CHEMO INTRATHECAL 50 MG/5ML
50.0000 mg | Freq: Once | INTRATHECAL | Status: AC
  Administered 2013-03-20: 50 mg via INTRATHECAL
  Filled 2013-03-19: qty 5

## 2013-03-19 MED ORDER — PROCHLORPERAZINE EDISYLATE 5 MG/ML IJ SOLN: 10.0000 mg | Freq: Once | INTRAMUSCULAR | Status: DC

## 2013-03-19 MED ORDER — FLUCONAZOLE IN SODIUM CHLORIDE 200-0.9 MG/100ML-% IV SOLN
200.0000 mg | INTRAVENOUS | Status: DC
  Administered 2013-03-19 – 2013-03-20 (×2): 200 mg via INTRAVENOUS
  Filled 2013-03-19 (×2): qty 100

## 2013-03-19 MED ORDER — MIDAZOLAM HCL 2 MG/2ML IJ SOLN
INTRAMUSCULAR | Status: AC | PRN
  Administered 2013-03-19 (×2): 1 mg via INTRAVENOUS

## 2013-03-19 MED ORDER — LEVOFLOXACIN 500 MG PO TABS
500.0000 mg | ORAL_TABLET | Freq: Every day | ORAL | Status: DC
  Administered 2013-03-19 – 2013-03-22 (×4): 500 mg via ORAL
  Filled 2013-03-19 (×4): qty 1

## 2013-03-19 MED ORDER — DEXAMETHASONE 4 MG PO TABS
4.0000 mg | ORAL_TABLET | Freq: Two times a day (BID) | ORAL | Status: DC
  Administered 2013-03-19 – 2013-03-22 (×6): 4 mg via ORAL
  Filled 2013-03-19 (×8): qty 1

## 2013-03-19 MED ORDER — FENTANYL CITRATE 0.05 MG/ML IJ SOLN
INTRAMUSCULAR | Status: AC
  Filled 2013-03-19: qty 6

## 2013-03-19 MED ORDER — MIDAZOLAM HCL 2 MG/2ML IJ SOLN
INTRAMUSCULAR | Status: AC
  Filled 2013-03-19: qty 6

## 2013-03-19 MED ORDER — PANTOPRAZOLE SODIUM 40 MG IV SOLR
40.0000 mg | Freq: Two times a day (BID) | INTRAVENOUS | Status: DC
  Administered 2013-03-19 – 2013-03-21 (×6): 40 mg via INTRAVENOUS
  Filled 2013-03-19 (×11): qty 40

## 2013-03-19 NOTE — Procedures (Signed)
Technically successful CT guided bone marrow aspiration and biopsy of left iliac crest. No immediate complications. Awaiting pathology report.    

## 2013-03-19 NOTE — Progress Notes (Signed)
He had a rough day. Some nausea.  (+) oral thrush-start Diflucan.  Will add Protonix.  Need to start  Levaquin.  The Ommaya site looks good.  I will see about doing Depocyt in the AM.  I will call pharmacy.   For BM bx today.  Will check MTX level today - this will give me a good idea on discharge.  Blood sugars are ok.  No diarrhea. Urinating well.  VSS.  Oral:  (+) thrush. Lungs:  Clear. Cor:  RRR Abd:  Soft. (+) BS (-) tender Ext:  Weak.  Muscle atrophy bilat. CNS:  ? Slightly better CN VII palsy.  Continue to support post chemo.  I appreciate PT's help!!!  OUTSTANDING job by staff on 3E!!!  [name]  Phillipians 2:3

## 2013-03-19 NOTE — Progress Notes (Signed)
Patient refused left foot brace at this time, states "i want it to work on its own." biotech and Dr Marin Olp made aware.

## 2013-03-20 DIAGNOSIS — B370 Candidal stomatitis: Secondary | ICD-10-CM

## 2013-03-20 DIAGNOSIS — I998 Other disorder of circulatory system: Secondary | ICD-10-CM

## 2013-03-20 LAB — BASIC METABOLIC PANEL
BUN: 22 mg/dL (ref 6–23)
CALCIUM: 8.7 mg/dL (ref 8.4–10.5)
CO2: 24 mEq/L (ref 19–32)
Chloride: 101 mEq/L (ref 96–112)
Creatinine, Ser: 0.9 mg/dL (ref 0.50–1.35)
GFR calc non Af Amer: 87 mL/min — ABNORMAL LOW (ref >90)
GLUCOSE: 140 mg/dL — AB (ref 70–99)
POTASSIUM: 4.8 meq/L (ref 3.7–5.3)
Sodium: 136 mEq/L — ABNORMAL LOW (ref 137–147)

## 2013-03-20 LAB — URINALYSIS, DIPSTICK ONLY
BILIRUBIN URINE: NEGATIVE
BILIRUBIN URINE: NEGATIVE
BILIRUBIN URINE: NEGATIVE
BILIRUBIN URINE: NEGATIVE
Bilirubin Urine: NEGATIVE
Bilirubin Urine: NEGATIVE
GLUCOSE, UA: 250 mg/dL — AB
GLUCOSE, UA: 500 mg/dL — AB
GLUCOSE, UA: 500 mg/dL — AB
Glucose, UA: 100 mg/dL — AB
Glucose, UA: 100 mg/dL — AB
Glucose, UA: 250 mg/dL — AB
HGB URINE DIPSTICK: NEGATIVE
HGB URINE DIPSTICK: NEGATIVE
HGB URINE DIPSTICK: NEGATIVE
Hgb urine dipstick: NEGATIVE
Hgb urine dipstick: NEGATIVE
Hgb urine dipstick: NEGATIVE
KETONES UR: NEGATIVE mg/dL
KETONES UR: NEGATIVE mg/dL
Ketones, ur: NEGATIVE mg/dL
Ketones, ur: NEGATIVE mg/dL
Ketones, ur: NEGATIVE mg/dL
Ketones, ur: NEGATIVE mg/dL
LEUKOCYTES UA: NEGATIVE
LEUKOCYTES UA: NEGATIVE
LEUKOCYTES UA: NEGATIVE
Leukocytes, UA: NEGATIVE
Leukocytes, UA: NEGATIVE
Leukocytes, UA: NEGATIVE
NITRITE: NEGATIVE
Nitrite: NEGATIVE
Nitrite: NEGATIVE
Nitrite: NEGATIVE
Nitrite: NEGATIVE
Nitrite: NEGATIVE
PH: 8 (ref 5.0–8.0)
PH: 7.5 (ref 5.0–8.0)
PH: 6 (ref 5.0–8.0)
PROTEIN: NEGATIVE mg/dL
Protein, ur: NEGATIVE mg/dL
Protein, ur: NEGATIVE mg/dL
Protein, ur: NEGATIVE mg/dL
Protein, ur: NEGATIVE mg/dL
Protein, ur: NEGATIVE mg/dL
SPECIFIC GRAVITY, URINE: 1.012 (ref 1.005–1.030)
SPECIFIC GRAVITY, URINE: 1.013 (ref 1.005–1.030)
SPECIFIC GRAVITY, URINE: 1.014 (ref 1.005–1.030)
Specific Gravity, Urine: 1.013 (ref 1.005–1.030)
Specific Gravity, Urine: 1.016 (ref 1.005–1.030)
Specific Gravity, Urine: 1.014 (ref 1.005–1.030)
UROBILINOGEN UA: 0.2 mg/dL (ref 0.0–1.0)
UROBILINOGEN UA: 0.2 mg/dL (ref 0.0–1.0)
UROBILINOGEN UA: 0.2 mg/dL (ref 0.0–1.0)
Urobilinogen, UA: 1 mg/dL (ref 0.0–1.0)
Urobilinogen, UA: 1 mg/dL (ref 0.0–1.0)
Urobilinogen, UA: 1 mg/dL (ref 0.0–1.0)
pH: 6.5 (ref 5.0–8.0)
pH: 7.5 (ref 5.0–8.0)
pH: 7.5 (ref 5.0–8.0)

## 2013-03-20 LAB — GLUCOSE, CAPILLARY
GLUCOSE-CAPILLARY: 178 mg/dL — AB (ref 70–99)
Glucose-Capillary: 120 mg/dL — ABNORMAL HIGH (ref 70–99)
Glucose-Capillary: 192 mg/dL — ABNORMAL HIGH (ref 70–99)
Glucose-Capillary: 148 mg/dL — ABNORMAL HIGH (ref 70–99)

## 2013-03-20 LAB — METHOTREXATE: METHOTREXATE: 0.68

## 2013-03-20 LAB — CBC
HCT: 29.8 % — ABNORMAL LOW (ref 39.0–52.0)
HEMOGLOBIN: 10.1 g/dL — AB (ref 13.0–17.0)
MCH: 32.1 pg (ref 26.0–34.0)
MCHC: 33.9 g/dL (ref 30.0–36.0)
MCV: 94.6 fL (ref 78.0–100.0)
PLATELETS: 126 10*3/uL — AB (ref 150–400)
RBC: 3.15 MIL/uL — ABNORMAL LOW (ref 4.22–5.81)
RDW: 14.9 % (ref 11.5–15.5)
WBC: 6.1 10*3/uL (ref 4.0–10.5)

## 2013-03-20 MED ORDER — TAMSULOSIN HCL 0.4 MG PO CAPS: 0.4000 mg | ORAL_CAPSULE | Freq: Every day | ORAL | Status: DC

## 2013-03-20 MED ORDER — ONDANSETRON HCL 4 MG PO TABS: 4.0000 mg | ORAL_TABLET | ORAL | Status: DC | PRN

## 2013-03-20 MED ORDER — LEVOFLOXACIN 500 MG PO TABS: 500.0000 mg | ORAL_TABLET | Freq: Every day | ORAL | Status: DC

## 2013-03-20 MED ORDER — SODIUM CHLORIDE 0.9 % IV SOLN
25.0000 mg | Freq: Three times a day (TID) | INTRAVENOUS | Status: DC | PRN
  Administered 2013-03-20: 25 mg via INTRAVENOUS
  Filled 2013-03-20: qty 1

## 2013-03-20 MED ORDER — INSULIN ASPART 100 UNIT/ML ~~LOC~~ SOLN
4.0000 [IU] | Freq: Three times a day (TID) | SUBCUTANEOUS | Status: DC
  Administered 2013-03-20: 4 [IU] via SUBCUTANEOUS

## 2013-03-20 MED ORDER — INSULIN ASPART 100 UNIT/ML ~~LOC~~ SOLN: 0.0000 [IU] | Freq: Three times a day (TID) | SUBCUTANEOUS | Status: DC

## 2013-03-20 MED ORDER — DSS 100 MG PO CAPS: 100.0000 mg | ORAL_CAPSULE | Freq: Two times a day (BID) | ORAL | Status: DC

## 2013-03-20 MED ORDER — FLUCONAZOLE 100 MG PO TABS
100.0000 mg | ORAL_TABLET | Freq: Every day | ORAL | Status: DC
  Administered 2013-03-21 – 2013-03-22 (×2): 100 mg via ORAL
  Filled 2013-03-20 (×3): qty 1

## 2013-03-20 MED ORDER — FLUCONAZOLE 100 MG PO TABS
100.0000 mg | ORAL_TABLET | Freq: Every day | ORAL | Status: DC
  Filled 2013-03-20 (×2): qty 1

## 2013-03-20 MED ORDER — SODIUM BICARBONATE/SODIUM CHLORIDE MOUTHWASH: 1.0000 "application " | Freq: Every day | OROMUCOSAL | Status: DC

## 2013-03-20 MED ORDER — FLUCONAZOLE 100 MG PO TABS: 100.0000 mg | ORAL_TABLET | Freq: Every day | ORAL | Status: DC

## 2013-03-20 MED ORDER — BIOTENE DRY MOUTH MT LIQD: 15.0000 mL | Freq: Every day | OROMUCOSAL | Status: DC

## 2013-03-20 MED ORDER — INSULIN ASPART 100 UNIT/ML ~~LOC~~ SOLN
0.0000 [IU] | Freq: Three times a day (TID) | SUBCUTANEOUS | Status: DC
  Administered 2013-03-20 (×2): 3 [IU] via SUBCUTANEOUS
  Administered 2013-03-21: 2 [IU] via SUBCUTANEOUS

## 2013-03-20 MED ORDER — ARTIFICIAL TEARS OP OINT: TOPICAL_OINTMENT | Freq: Three times a day (TID) | OPHTHALMIC | Status: DC

## 2013-03-20 MED ORDER — LORAZEPAM 0.5 MG PO TABS: 0.5000 mg | ORAL_TABLET | Freq: Four times a day (QID) | ORAL | Status: AC | PRN

## 2013-03-20 NOTE — Progress Notes (Signed)
Mr. Scarlata looks a lot better. He feels better. The bone marrow test went well yesterday.  His appetite maybe a little bit better. He does not have the abdominal pain and that he had. I started him on a proton pump inhibitor.  He does have thrush. I put him on some Diflucan.  He still urinating a lot. There's no diarrhea.  Is a maybe a little bit more active.  His blood sugars have been a little on the lower side. We may have to readjust his sliding scale insulin regimen.  He's had no fever. There's no headache. His pain seems to be a low but better.  On physical exam, vital signs show temperature of 97 8. Pulse 79. Blood pressure 135/78. Head and neck exam shows no ocular or oral lesions. He still has the right cranial nerve 7 palsy. I do not see thrush. There is no adenopathy in the neck. Lungs are clear. Cardiac exam regular rate and rhythm. Abdomen is soft. Has good bowel sounds. Neurological exam shows no focal neurological deficits. He does have the right cranial nerve 7 palsy. This maybe a little less prominent. He does have the left foot drop. Skin exam shows some scattered ecchymoses.  His laboratory studies shows a once a count 6.1. Hemoglobin 10.1. Platelet count 126. BUN 22 and creatinine 0.9. Potassium 4.8.  I did do an injection of liposomal araC (DepoCyt) into his Ommaya reservoir. I did is sterilely. I obtained 3 cc of clear spinal fluid. I injected 50 mg of DepoCyt. I placed a Band-Aid on the Ommaya site. He tolerated this well.  His methotrexate level yesterday was 0.94. Hopefully today, he will be close to 0.05. If so, then we might be able to discharge him in today.  He does look better than yesterday and I am happy about this.  Keith E.  Ephesians 6:9-10

## 2013-03-20 NOTE — Discharge Instructions (Signed)
Bone Marrow Aspiration, Bone Marrow Biopsy Care After Read the instructions outlined below and refer to this sheet in the next few weeks. These discharge instructions provide you with general information on caring for yourself after you leave the hospital. Your caregiver may also give you specific instructions. While your treatment has been planned according to the most current medical practices available, unavoidable complications occasionally occur. If you have any problems or questions after discharge, call your caregiver. FINDING OUT THE RESULTS OF YOUR TEST Not all test results are available during your visit. If your test results are not back during the visit, make an appointment with your caregiver to find out the results. Do not assume everything is normal if you have not heard from your caregiver or the medical facility. It is important for you to follow up on all of your test results.  HOME CARE INSTRUCTIONS  You have had sedation and may be sleepy or dizzy. Your thinking may not be as clear as usual. For the next 24 hours:  Only take over-the-counter or prescription medicines for pain, discomfort, and or fever as directed by your caregiver.  Do not drink alcohol.  Do not smoke.  Do not drive.  Do not make important legal decisions.  Do not operate heavy machinery.  Do not care for small children by yourself.  Keep your dressing clean and dry. You may replace dressing with a bandage after 24 hours.  You may take a bath or shower after 24 hours.  Use an ice pack for 20 minutes every 2 hours while awake for pain as needed. SEEK MEDICAL CARE IF:   There is redness, swelling, or increasing pain at the biopsy site.  There is pus coming from the biopsy site.  There is drainage from a biopsy site lasting longer than one day.  An unexplained oral temperature above 102 F (38.9 C) develops. SEEK IMMEDIATE MEDICAL CARE IF:   You develop a rash.  You have difficulty  breathing.  You develop any reaction or side effects to medications given. Document Released: 08/24/2004 Document Revised: 04/29/2011 Document Reviewed: 02/02/2008 [facility] Patient Information 2014 [place].  Call if you have a temperature over 101*.  Call if bleeding, bad pain, vomiting, seizures, weakness, diarrhea.  Keep drinking a lot of fluid.

## 2013-03-21 LAB — URINALYSIS, DIPSTICK ONLY
BILIRUBIN URINE: NEGATIVE
Bilirubin Urine: NEGATIVE
Bilirubin Urine: NEGATIVE
Bilirubin Urine: NEGATIVE
Bilirubin Urine: NEGATIVE
GLUCOSE, UA: NEGATIVE mg/dL
GLUCOSE, UA: NEGATIVE mg/dL
Glucose, UA: 100 mg/dL — AB
Glucose, UA: 100 mg/dL — AB
Glucose, UA: NEGATIVE mg/dL
Hgb urine dipstick: NEGATIVE
Hgb urine dipstick: NEGATIVE
Hgb urine dipstick: NEGATIVE
Hgb urine dipstick: NEGATIVE
Hgb urine dipstick: NEGATIVE
KETONES UR: NEGATIVE mg/dL
KETONES UR: NEGATIVE mg/dL
KETONES UR: NEGATIVE mg/dL
KETONES UR: NEGATIVE mg/dL
Ketones, ur: NEGATIVE mg/dL
LEUKOCYTES UA: NEGATIVE
Leukocytes, UA: NEGATIVE
Leukocytes, UA: NEGATIVE
Leukocytes, UA: NEGATIVE
Leukocytes, UA: NEGATIVE
NITRITE: NEGATIVE
NITRITE: NEGATIVE
NITRITE: NEGATIVE
Nitrite: NEGATIVE
Nitrite: NEGATIVE
PH: 8 (ref 5.0–8.0)
PH: 8 (ref 5.0–8.0)
PROTEIN: NEGATIVE mg/dL
PROTEIN: NEGATIVE mg/dL
PROTEIN: NEGATIVE mg/dL
Protein, ur: NEGATIVE mg/dL
Protein, ur: NEGATIVE mg/dL
SPECIFIC GRAVITY, URINE: 1.011 (ref 1.005–1.030)
Specific Gravity, Urine: 1.013 (ref 1.005–1.030)
Specific Gravity, Urine: 1.014 (ref 1.005–1.030)
Specific Gravity, Urine: 1.015 (ref 1.005–1.030)
Specific Gravity, Urine: 1.012 (ref 1.005–1.030)
UROBILINOGEN UA: 1 mg/dL (ref 0.0–1.0)
UROBILINOGEN UA: 0.2 mg/dL (ref 0.0–1.0)
UROBILINOGEN UA: 0.2 mg/dL (ref 0.0–1.0)
Urobilinogen, UA: 0.2 mg/dL (ref 0.0–1.0)
Urobilinogen, UA: 0.2 mg/dL (ref 0.0–1.0)
pH: 7.5 (ref 5.0–8.0)
pH: 8 (ref 5.0–8.0)
pH: 8.5 — ABNORMAL HIGH (ref 5.0–8.0)

## 2013-03-21 LAB — GLUCOSE, CAPILLARY
GLUCOSE-CAPILLARY: 138 mg/dL — AB (ref 70–99)
Glucose-Capillary: 121 mg/dL — ABNORMAL HIGH (ref 70–99)
Glucose-Capillary: 100 mg/dL — ABNORMAL HIGH (ref 70–99)
Glucose-Capillary: 221 mg/dL — ABNORMAL HIGH (ref 70–99)

## 2013-03-21 LAB — BASIC METABOLIC PANEL
BUN: 25 mg/dL — ABNORMAL HIGH (ref 6–23)
CHLORIDE: 100 meq/L (ref 96–112)
CO2: 24 mEq/L (ref 19–32)
Calcium: 8.6 mg/dL (ref 8.4–10.5)
Creatinine, Ser: 0.96 mg/dL (ref 0.50–1.35)
GFR calc Af Amer: >90 mL/min (ref >90)
GFR, EST NON AFRICAN AMERICAN: 85 mL/min — AB (ref >90)
Glucose, Bld: 138 mg/dL — ABNORMAL HIGH (ref 70–99)
Potassium: 4.7 mEq/L (ref 3.7–5.3)
SODIUM: 135 meq/L — AB (ref 137–147)

## 2013-03-21 LAB — CBC
HCT: 27.2 % — ABNORMAL LOW (ref 39.0–52.0)
Hemoglobin: 9.4 g/dL — ABNORMAL LOW (ref 13.0–17.0)
MCH: 32.6 pg (ref 26.0–34.0)
MCHC: 34.6 g/dL (ref 30.0–36.0)
MCV: 94.4 fL (ref 78.0–100.0)
Platelets: 123 10*3/uL — ABNORMAL LOW (ref 150–400)
RBC: 2.88 MIL/uL — ABNORMAL LOW (ref 4.22–5.81)
RDW: 14.7 % (ref 11.5–15.5)
WBC: 3.4 10*3/uL — ABNORMAL LOW (ref 4.0–10.5)

## 2013-03-21 LAB — METHOTREXATE: METHOTREXATE: 0.5

## 2013-03-21 MED ORDER — ENSURE COMPLETE PO LIQD
237.0000 mL | Freq: Two times a day (BID) | ORAL | Status: DC
  Administered 2013-03-21: 237 mL via ORAL

## 2013-03-21 MED ORDER — LEUCOVORIN CALCIUM INJECTION 100 MG
20.0000 mg/m2 | INTRAMUSCULAR | Status: DC
  Administered 2013-03-21 – 2013-03-22 (×6): 38 mg via INTRAVENOUS
  Filled 2013-03-21 (×8): qty 1.9

## 2013-03-21 NOTE — Progress Notes (Signed)
Keith Duffy   DOB:November 07, 1945   QI#:[id number]   LOV#:[id number]  Subjective: Patient with non-Hodgkin CNS lymphoma. He has received methotrexate high-dose. He also has a Ommaya reservoir and received liposomal ara-C yesterday. He was to be discharged to home today per her instructions  from his primary oncologist. Unfortunately patient's methotrexate level is 0.5. This is not low enough for him to safely be discharged to home. I have spoken to the patient regarding this. He is very upset today but he will stay. We will continue fluids as well as leucovorin rescue.   Objective:  Filed Vitals:   03/21/13 0945  BP: 145/78  Pulse: 66  Temp:   Resp:     Body mass index is 23.89 kg/(m^2).  Intake/Output Summary (Last 24 hours) at 03/21/13 1204 Last data filed at 03/21/13 0900  Gross per 24 hour  Intake 1441.67 ml  Output   1425 ml  Net  16.67 ml     Sclerae unicteric  Oropharynx clear  No peripheral adenopathy  Lungs clear -- no rales or rhonchi  Heart regular rate and rhythm  Abdomen benign  MSK no focal spinal tenderness, no peripheral edema  Neuro nonfocal   CBG (last 3)   Recent Labs  03/20/13 1801 03/20/13 2144 03/21/13 0836  GLUCAP 178* 148* 121*     Labs:  Lab Results  Component Value Date   WBC 3.4* 03/21/2013   HGB 9.4* 03/21/2013   HCT 27.2* 03/21/2013   MCV 94.4 03/21/2013   PLT 123* 03/21/2013   NEUTROABS 9.3* 03/12/2013    Urine Studies No results found for this basename: UACOL, UAPR, USPG, UPH, UTP, UGL, UKET, UBIL, UHGB, UNIT, UROB, ULEU, UEPI, UWBC, URBC, UBAC, CAST, CRYS, UCOM, BILUA,  in the last 72 hours  Basic Metabolic Panel:  Recent Labs Lab 03/17/13 0325 03/18/13 0530 03/19/13 0542 03/20/13 0500 03/21/13 0605  NA 135* 135* 135* 136* 135*  K 3.9 4.0 4.1 4.8 4.7  CL 96 98 98 101 100  CO2 24 27 25 24 24   GLUCOSE 193* 100* 112* 140* 138*  BUN 17 22 20 22  25*  CREATININE 0.84 0.92 0.89 0.90 0.96  CALCIUM 8.6 8.7 8.4 8.7 8.6   GFR Estimated  Creatinine Clearance: 76.7 ml/min (by C-G formula based on Cr of 0.96). Liver Function Tests: No results found for this basename: AST, ALT, ALKPHOS, BILITOT, PROT, ALBUMIN,  in the last 168 hours No results found for this basename: LIPASE, AMYLASE,  in the last 168 hours No results found for this basename: AMMONIA,  in the last 168 hours Coagulation profile  Recent Labs Lab 03/19/13 0542  INR 0.98    CBC:  Recent Labs Lab 03/17/13 0325 03/18/13 0530 03/19/13 0542 03/20/13 0500 03/21/13 0605  WBC 6.9 10.7* 8.5 6.1 3.4*  HGB 11.3* 11.2* 10.8* 10.1* 9.4*  HCT 32.4* 32.7* 31.2* 29.8* 27.2*  MCV 91.8 92.9 93.7 94.6 94.4  PLT 118* 140* 126* 126* 123*   Cardiac Enzymes: No results found for this basename: CKTOTAL, CKMB, CKMBINDEX, TROPONINI,  in the last 168 hours BNP: No components found with this basename: POCBNP,  CBG:  Recent Labs Lab 03/20/13 0800 03/20/13 1152 03/20/13 1801 03/20/13 2144 03/21/13 0836  GLUCAP 120* 192* 178* 148* 121*   D-Dimer No results found for this basename: DDIMER,  in the last 72 hours Hgb A1c No results found for this basename: HGBA1C,  in the last 72 hours Lipid Profile No results found for this basename: CHOL, HDL, LDLCALC, TRIG,  CHOLHDL, LDLDIRECT,  in the last 72 hours Thyroid function studies No results found for this basename: TSH, T4TOTAL, FREET3, T3FREE, THYROIDAB,  in the last 72 hours Anemia work up No results found for this basename: VITAMINB12, FOLATE, FERRITIN, TIBC, IRON, RETICCTPCT,  in the last 72 hours Microbiology No results found for this or any previous visit (from the past 240 hour(s)).    Studies:  No results found.  Assessment/plan: 68 y.o. with non-Hodgkin CNS lymphoma. He is status post high-dose methotrexate. He is currently on leucovorin rescue. His methotrexate level is 0.5 which is still high. Therefore he will not be discharged. We will continue leucovorin rescue as well as aggressive hydration. Patient  did receive liposomal ara-C in his Ommaya reservoir yesterday. He tolerated the dose very well. His blood counts look stable today. He has no evidence of bleeding. We will check another methotrexate level. If it is 0.05 and he can be discharged to home tomorrow.   Humphrey Rolls, Anaiza Behrens 03/21/2013

## 2013-03-21 NOTE — Progress Notes (Addendum)
Nutrition Brief Note  Consult received. Patient would like Ensure supplements.   Spoke with patient and wife, who state that the patient drinks Ensure at home because they make him feel stronger. However, they were anticipating d/c home today. Patient will now have to stay overnight due to methotrexate level, with likely d/c tomorrow.   RD will order Ensure shakes BID for patient while he is still here.   Wt Readings from Last 15 Encounters:  03/18/13 161 lb 13.1 oz (73.4 kg)  03/18/13 161 lb 13.1 oz (73.4 kg)  03/09/13 161 lb (73.029 kg)  03/05/13 168 lb (76.204 kg)  02/08/13 168 lb (76.204 kg)  01/18/13 173 lb (78.472 kg)  11/27/12 173 lb 15.1 oz (78.9 kg)  11/18/12 178 lb (80.74 kg)  10/28/12 181 lb (82.101 kg)  10/08/12 174 lb (78.926 kg)  09/10/12 162 lb (73.483 kg)  09/05/12 157 lb 1.6 oz (71.26 kg)  09/01/12 161 lb (73.029 kg)  08/26/12 172 lb 4.8 oz (78.155 kg)    Body mass index is 23.89 kg/(m^2). Patient meets criteria for normal weight based on current BMI.   Current diet order is Regular, patient is consuming approximately 50-75% of meals at this time. Labs and medications reviewed.   No nutrition interventions warranted at this time. If nutrition issues arise, please consult RD.   Larey Seat, RD, LDN Pager #: [phone] After-Hours Pager #: [phone]

## 2013-03-22 ENCOUNTER — Other Ambulatory Visit: Payer: Self-pay | Admitting: Hematology & Oncology

## 2013-03-22 ENCOUNTER — Telehealth: Payer: Self-pay | Admitting: Hematology & Oncology

## 2013-03-22 DIAGNOSIS — C8590 Non-Hodgkin lymphoma, unspecified, unspecified site: Secondary | ICD-10-CM

## 2013-03-22 LAB — URINALYSIS, DIPSTICK ONLY
BILIRUBIN URINE: NEGATIVE
Bilirubin Urine: NEGATIVE
Bilirubin Urine: NEGATIVE
Glucose, UA: 500 mg/dL — AB
Glucose, UA: 250 mg/dL — AB
Glucose, UA: 100 mg/dL — AB
HGB URINE DIPSTICK: NEGATIVE
Hgb urine dipstick: NEGATIVE
Hgb urine dipstick: NEGATIVE
KETONES UR: NEGATIVE mg/dL
Ketones, ur: NEGATIVE mg/dL
Ketones, ur: NEGATIVE mg/dL
LEUKOCYTES UA: NEGATIVE
Leukocytes, UA: NEGATIVE
Leukocytes, UA: NEGATIVE
NITRITE: NEGATIVE
Nitrite: NEGATIVE
Nitrite: NEGATIVE
PH: 8 (ref 5.0–8.0)
Protein, ur: NEGATIVE mg/dL
Protein, ur: NEGATIVE mg/dL
Protein, ur: NEGATIVE mg/dL
Specific Gravity, Urine: 1.016 (ref 1.005–1.030)
Specific Gravity, Urine: 1.013 (ref 1.005–1.030)
Specific Gravity, Urine: 1.012 (ref 1.005–1.030)
UROBILINOGEN UA: 0.2 mg/dL (ref 0.0–1.0)
Urobilinogen, UA: 0.2 mg/dL (ref 0.0–1.0)
Urobilinogen, UA: 0.2 mg/dL (ref 0.0–1.0)
pH: 7.5 (ref 5.0–8.0)
pH: 8 (ref 5.0–8.0)

## 2013-03-22 LAB — CBC
HCT: 30.4 % — ABNORMAL LOW (ref 39.0–52.0)
Hemoglobin: 10.4 g/dL — ABNORMAL LOW (ref 13.0–17.0)
MCH: 31.9 pg (ref 26.0–34.0)
MCHC: 34.2 g/dL (ref 30.0–36.0)
MCV: 93.3 fL (ref 78.0–100.0)
Platelets: 110 10*3/uL — ABNORMAL LOW (ref 150–400)
RBC: 3.26 MIL/uL — ABNORMAL LOW (ref 4.22–5.81)
RDW: 14.6 % (ref 11.5–15.5)
WBC: 1.1 10*3/uL — AB (ref 4.0–10.5)

## 2013-03-22 LAB — GLUCOSE, CAPILLARY: Glucose-Capillary: 115 mg/dL — ABNORMAL HIGH (ref 70–99)

## 2013-03-22 LAB — BASIC METABOLIC PANEL
BUN: 25 mg/dL — ABNORMAL HIGH (ref 6–23)
CHLORIDE: 99 meq/L (ref 96–112)
CO2: 23 mEq/L (ref 19–32)
Calcium: 9 mg/dL (ref 8.4–10.5)
Creatinine, Ser: 0.95 mg/dL (ref 0.50–1.35)
GFR calc Af Amer: >90 mL/min (ref >90)
GFR calc non Af Amer: 85 mL/min — ABNORMAL LOW (ref >90)
Glucose, Bld: 126 mg/dL — ABNORMAL HIGH (ref 70–99)
POTASSIUM: 4.7 meq/L (ref 3.7–5.3)
Sodium: 135 mEq/L — ABNORMAL LOW (ref 137–147)

## 2013-03-22 LAB — METHOTREXATE: METHOTREXATE: 0.19

## 2013-03-22 MED ORDER — FILGRASTIM 480 MCG/1.6ML IJ SOLN
480.0000 ug | Freq: Once | INTRAMUSCULAR | Status: AC
  Administered 2013-03-22: 480 ug via SUBCUTANEOUS
  Filled 2013-03-22: qty 1.6

## 2013-03-22 MED ORDER — SODIUM CHLORIDE 0.9 % IJ SOLN: 10.0000 mL | INTRAMUSCULAR | Status: DC | PRN

## 2013-03-22 MED ORDER — HEPARIN SOD (PORK) LOCK FLUSH 100 UNIT/ML IV SOLN
500.0000 [IU] | Freq: Once | INTRAVENOUS | Status: AC
  Administered 2013-03-22: 500 [IU] via INTRAVENOUS
  Filled 2013-03-22: qty 5

## 2013-03-22 MED ORDER — SODIUM CHLORIDE 0.9 % IJ SOLN: 10.0000 mL | Freq: Two times a day (BID) | INTRAMUSCULAR | Status: DC

## 2013-03-22 NOTE — Discharge Summary (Signed)
NAME:  Keith Duffy, Keith Duffy NO.:  [id number]  MEDICAL RECORD NO.:  [id number]  LOCATION:                                 FACILITY:  PHYSICIAN:  Volanda Napoleon, M.D.  DATE OF BIRTH:  1946/01/14  DATE OF ADMISSION:  03/12/2013 DATE OF DISCHARGE:  03/22/2013                              DISCHARGE SUMMARY   DIAGNOSES ON DISCHARGE: 1. 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 2. Insertion of Ommaya reservoir by Dr. Arnoldo Morale of Neurosurgery. 3. Initiation of systemic chemotherapy with R-MCV. 4. Installation of DepoCyt into the Ommaya reservoir. 5. Insulin-dependent diabetes. 6. Right cranial nerve VII palsy. 7. Left footdrop. 8. Transient diarrhea. 9. Anemia secondary to chemotherapy and lymphoma. 10.Transient nausea and vomiting from chemotherapy.  CONDITION UPON DISCHARGE:  Stable.  ACTIVITIES:  As tolerated.  DIET:  Without restrictions.  Patient will watch how much sugar he takes in.  FOLLOWUP: 1. The patient will come into the [facility] on     March 23, 2013, for lab work and Neulasta. 2. The patient will see Dr. Marin Olp back in the office in about 10     days.  MEDICATIONS ON DISCHARGE: 1. Dulcolax 200 mg p.o. b.i.d. 2. Diflucan 100 mg p.o. daily. 3. Levaquin 500 mg p.o. daily. 4. Ativan 0.5 mg p.o. q.6 h. p.r.n. 5. Zofran 8 mg p.o. q.12 h. p.r.n. nausea and vomiting. 6. Compazine 10 mg p.o. q.6 h. p.r.n. nausea and vomiting. 7. Flomax 0.4 mg p.o. daily. 8. Sodium bicarb mouth rinse. 9. Famvir 250 mg p.o. daily. 10.Fentanyl patch dose 12.5 mcg to skin every 3 days. 11.Insulin Lantus 25 units subcu at bedtime. 12.Insulin regular as on sliding scale. 13.Mobic 15 mg p.o. daily. 14.Metoprolol 25 mg p.o. b.i.d. 15.Oxycodone 10-20 mg p.o. q.4 h. p.r.n.  HOSPITAL COURSE:  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  He had a Port-A-Cath in  place already.  We accessed the Port-A-Cath.  We started him on IV fluids with sodium bicarb.  It took a bit longer than I had anticipated to get the urine pH up.  We went ahead and started chemotherapy on the 24th.  He got Rituxan at 1000 mg dose.  Vincristine was 2 mg IV push on the 24th.  He got Thiotepa 35 mg/m2 (65 mg) dose on the 24th.  Again, we were awaiting his urine pH to get above 7.  In the meantime, we were controlling his blood sugars.  Dr. Arnoldo Morale was very kind and got him over to [facility] on the 26th for the Ommaya reservoir.  This was placed without any difficulties. Mr. Yoshino came back on the 27th.  Once he got back on the 27th, we are able to start the methotrexate.  He got a bolus dose of 1.5 g/m2.  He then got a 24-hour infusion dose of 6.9 g/m2.  The total dose was 13 g.  We then started him on leucovorin rescue.  He started leucovorin rescue on the 30th.  His methotrexate levels were monitored.  They came down a little bit slowly.  On the 1st, his methotrexate level was  0.5.  I increased his dose and frequency of leucovorin.  He really, really, really wanted to go home on the 2nd.  As such, I felt that we probably could get him home.  On the 31st, I injected 50 mg of deficit into his Ommaya reservoir. This went without any difficulties.  His white cell count did begin to trend downward.  On the 1st, his white cell count was 3.4.  Hemoglobin was 9.4 and platelet count was 123.  His electrolytes looked good.  BUN was 25, creatinine 0.96.  Calcium 8.6.  His blood sugars were little on the higher side, but I was okay with this.  We did get physical therapy with him.  We tried to get a brace for his left footdrop.  He did not want to have this.  He had some abdominal pain.  He had little bit of nausea.  He had little bit of diarrhea.  These were all fairly well controlled.  Again, we were ready for discharge on the 2nd.  DISCHARGE PHYSICAL  EXAMINATION:  General:  Upon discharge, his physical exam shows a fairly well-developed, well-nourished white gentleman in no obvious distress.  He does have the right cranial nerve palsy, but this appears to be a little bit better.  Vital Signs:  Upon discharge showed temperature of 98.3, pulse 83, respiratory rate 16, blood pressure 154/77.  Head and Neck:  No ocular or oral lesions.  He does have the right facial nerve palsy.  There is no mucositis.  There is no adenopathy in the neck.  Lungs:  Clear bilaterally.  Cardiac:  Regular rate and rhythm with a normal S1 and S2.  There are no murmurs, rubs, or bruits.  Abdomen.  He has good bowel sounds.  There is no fluid wave.  There is no palpable hepatosplenomegaly.  Extremities:  No clubbing, cyanosis, or edema. Again, he does have the foot drop with the left foot.  This is about the same.  Skin:  No rashes.  Neurological:  The above-mentioned right cranial nerve VII palsy and the left foot drop.     Volanda Napoleon, M.D.     PRE/MEDQ  D:  03/22/2013  T:  03/22/2013  Job:  [id number]

## 2013-03-22 NOTE — Discharge Summary (Signed)
#  [id number] is d/c note.  Keith Duffy

## 2013-03-22 NOTE — Telephone Encounter (Signed)
Pt aware of 2-13 PET npo 6 hrs and to hold insulin, 2-10 cx and 2-17 MD and tx appointments

## 2013-03-22 NOTE — Progress Notes (Signed)
CRITICAL VALUE ALERT  Critical value received:  W.c 1.1  Date of notification:  03/22/13  Time of notification:  0645  Critical value read back:yes  Nurse who received alert:  Harlow Asa  MD notified (1st page):  Ennever  Time of first page:  0655  MD notified (2nd page):  Time of second page:  Responding MD:  Marin Olp  Time MD responded:  [phone]

## 2013-03-23 ENCOUNTER — Ambulatory Visit (HOSPITAL_BASED_OUTPATIENT_CLINIC_OR_DEPARTMENT_OTHER): Payer: Medicare Other | Admitting: Lab

## 2013-03-23 ENCOUNTER — Other Ambulatory Visit (HOSPITAL_COMMUNITY): Payer: Medicare Other

## 2013-03-23 ENCOUNTER — Ambulatory Visit (HOSPITAL_BASED_OUTPATIENT_CLINIC_OR_DEPARTMENT_OTHER): Payer: Medicare Other

## 2013-03-23 DIAGNOSIS — C8589 Other specified types of non-Hodgkin lymphoma, extranodal and solid organ sites: Secondary | ICD-10-CM

## 2013-03-23 DIAGNOSIS — C7949 Secondary malignant neoplasm of other parts of nervous system: Secondary | ICD-10-CM

## 2013-03-23 DIAGNOSIS — C8590 Non-Hodgkin lymphoma, unspecified, unspecified site: Secondary | ICD-10-CM

## 2013-03-23 DIAGNOSIS — Z5189 Encounter for other specified aftercare: Secondary | ICD-10-CM

## 2013-03-23 DIAGNOSIS — C801 Malignant (primary) neoplasm, unspecified: Principal | ICD-10-CM

## 2013-03-23 LAB — CBC WITH DIFFERENTIAL (CANCER CENTER ONLY)
BASO#: 0 10*3/uL (ref 0.0–0.2)
BASO%: 0 % (ref 0.0–2.0)
EOS ABS: 0.1 10*3/uL (ref 0.0–0.5)
EOS%: 0.9 % (ref 0.0–7.0)
HEMATOCRIT: 31.9 % — AB (ref 38.7–49.9)
HGB: 10.7 g/dL — ABNORMAL LOW (ref 13.0–17.1)
LYMPH#: 0.2 10*3/uL — ABNORMAL LOW (ref 0.9–3.3)
LYMPH%: 2.2 % — AB (ref 14.0–48.0)
MCH: 32.1 pg (ref 28.0–33.4)
MCHC: 33.5 g/dL (ref 32.0–35.9)
MCV: 96 fL (ref 82–98)
MONO#: 0.1 10*3/uL (ref 0.1–0.9)
MONO%: 0.9 % (ref 0.0–13.0)
NEUT#: 7.4 10*3/uL — ABNORMAL HIGH (ref 1.5–6.5)
NEUT%: 96 % — AB (ref 40.0–80.0)
Platelets: 75 10*3/uL — ABNORMAL LOW (ref 145–400)
RBC: 3.33 10*6/uL — AB (ref 4.20–5.70)
RDW: 14.5 % (ref 11.1–15.7)
WBC: 7.7 10*3/uL (ref 4.0–10.0)

## 2013-03-23 LAB — CMP (CANCER CENTER ONLY)
ALK PHOS: 72 U/L (ref 26–84)
ALT(SGPT): 31 U/L (ref 10–47)
AST: 21 U/L (ref 11–38)
Albumin: 3 g/dL — ABNORMAL LOW (ref 3.3–5.5)
BILIRUBIN TOTAL: 0.9 mg/dL (ref 0.20–1.60)
BUN, Bld: 36 mg/dL — ABNORMAL HIGH (ref 7–22)
CO2: 27 mEq/L (ref 18–33)
Calcium: 9.1 mg/dL (ref 8.0–10.3)
Chloride: 103 mEq/L (ref 98–108)
Creat: 1.1 mg/dl (ref 0.6–1.2)
Glucose, Bld: 195 mg/dL — ABNORMAL HIGH (ref 73–118)
Potassium: 4.2 mEq/L (ref 3.3–4.7)
SODIUM: 138 meq/L (ref 128–145)
TOTAL PROTEIN: 6.2 g/dL — AB (ref 6.4–8.1)

## 2013-03-23 MED ORDER — PEGFILGRASTIM INJECTION 6 MG/0.6ML
6.0000 mg | Freq: Once | SUBCUTANEOUS | Status: AC
  Administered 2013-03-23: 6 mg via SUBCUTANEOUS

## 2013-03-23 MED ORDER — VITAMIN B-6 250 MG PO TABS: 250.0000 mg | ORAL_TABLET | Freq: Every day | ORAL | Status: DC

## 2013-03-23 MED ORDER — PEGFILGRASTIM INJECTION 6 MG/0.6ML
SUBCUTANEOUS | Status: AC
  Filled 2013-03-23: qty 0.6

## 2013-03-23 NOTE — Progress Notes (Signed)
Staples removed per Dr. Antonieta Pert orders, tol well, incision site right frontal cranial area- dry, intact. Dressing applied. Discharge instructions given and discussed with patient and wife, verbalized understanding.

## 2013-03-23 NOTE — Patient Instructions (Signed)
Staple Removal Care After The staples used to close your skin have been removed. The wound needs continued care so it can heal completely and without problems. The care described here will need to be done for another 5-10 days unless your caregiver advises otherwise.  HOME CARE INSTRUCTIONS   Keep wound site dry and clean.  If skin adhesive strips were applied after the staples were removed, they will begin to peel off in a few days. If they remain after fourteen days, they may be peeled off and discarded.  If you still have a dressing, change it at least once a day or as instructed by your caregiver. If the bandage sticks, soak it off with warm water. Pat dry with a clean towel. Look for signs of infection (see below).  Reapply cream or ointment according to your caregiver's instruction. This will help prevent infection and keep the bandage from sticking. Use of a non-stick material over the wound and under the dressing or wrap will also help keep the bandage from sticking.  If the bandage becomes wet, dirty or develops a foul smell, change it as soon as possible.  New scars become sunburned easily. Use sunscreens with protection factor (SPF) of at least 15 when out in the sun.  Only take over-the-counter or prescription medicines for pain, discomfort or fever as directed by your caregiver. SEEK IMMEDIATE MEDICAL CARE IF:   There is redness, swelling or increasing pain in the wound.  Pus is coming from the wound.  An unexplained oral temperature above 102 F (38.9 C) develops.  You notice a foul smell coming from the wound or dressing.  There is a breaking open of the suture line (edges not staying together) of the wound edges after staples have been removed. Document Released: 01/18/2008 Document Revised: 04/29/2011 Document Reviewed: 01/18/2008 [facility] Patient Information 2014 [employer]. Pegfilgrastim injection What is this medicine? PEGFILGRASTIM (peg fil GRA stim) helps  the body make more white blood cells. It is used to prevent infection in people with low amounts of white blood cells following cancer treatment. This medicine may be used for other purposes; ask your health care provider or pharmacist if you have questions. COMMON BRAND NAME(S): Neulasta What should I tell my health care provider before I take this medicine? They need to know if you have any of these conditions: -sickle cell disease -an unusual or allergic reaction to pegfilgrastim, filgrastim, E.coli protein, other medicines, foods, dyes, or preservatives -pregnant or trying to get pregnant -breast-feeding How should I use this medicine? This medicine is for injection under the skin. It is usually given by a health care professional in a hospital or clinic setting. If you get this medicine at home, you will be taught how to prepare and give this medicine. Do not shake this medicine. Use exactly as directed. Take your medicine at regular intervals. Do not take your medicine more often than directed. It is important that you put your used needles and syringes in a special sharps container. Do not put them in a trash can. If you do not have a sharps container, call your pharmacist or healthcare provider to get one. Talk to your pediatrician regarding the use of this medicine in children. While this drug may be prescribed for children who weigh more than 45 kg for selected conditions, precautions do apply Overdosage: If you think you have taken too much of this medicine contact a poison control center or emergency room at once. NOTE: This medicine is  only for you. Do not share this medicine with others. What if I miss a dose? If you miss a dose, take it as soon as you can. If it is almost time for your next dose, take only that dose. Do not take double or extra doses. What may interact with this medicine? -lithium -medicines for growth therapy This list may not describe all possible interactions.  Give your health care provider a list of all the medicines, herbs, non-prescription drugs, or dietary supplements you use. Also tell them if you smoke, drink alcohol, or use illegal drugs. Some items may interact with your medicine. What should I watch for while using this medicine? Visit your doctor for regular check ups. You will need important blood work done while you are taking this medicine. What side effects may I notice from receiving this medicine? Side effects that you should report to your doctor or health care professional as soon as possible: -allergic reactions like skin rash, itching or hives, swelling of the face, lips, or tongue -breathing problems -fever -pain, redness, or swelling where injected -shoulder pain -stomach or side pain Side effects that usually do not require medical attention (report to your doctor or health care professional if they continue or are bothersome): -aches, pains -headache -loss of appetite -nausea, vomiting -unusually tired This list may not describe all possible side effects. Call your doctor for medical advice about side effects. You may report side effects to FDA at 1-800-FDA-1088. Where should I keep my medicine? Keep out of the reach of children. Store in a refrigerator between 2 and 8 degrees C (36 and 46 degrees F). Do not freeze. Keep in carton to protect from light. Throw away this medicine if it is left out of the refrigerator for more than 48 hours. Throw away any unused medicine after the expiration date. NOTE: This sheet is a summary. It may not cover all possible information. If you have questions about this medicine, talk to your doctor, pharmacist, or health care provider.  2014, [employer] Standard. (2007-09-07 15:41:44)

## 2013-03-29 LAB — CHROMOSOME ANALYSIS, BONE MARROW

## 2013-03-30 ENCOUNTER — Encounter: Payer: Self-pay | Admitting: Hematology & Oncology

## 2013-03-30 ENCOUNTER — Ambulatory Visit (HOSPITAL_BASED_OUTPATIENT_CLINIC_OR_DEPARTMENT_OTHER): Payer: Medicare Other

## 2013-03-30 ENCOUNTER — Ambulatory Visit (HOSPITAL_BASED_OUTPATIENT_CLINIC_OR_DEPARTMENT_OTHER): Payer: Medicare Other | Admitting: Hematology & Oncology

## 2013-03-30 ENCOUNTER — Other Ambulatory Visit: Payer: Self-pay | Admitting: Hematology & Oncology

## 2013-03-30 ENCOUNTER — Other Ambulatory Visit (HOSPITAL_BASED_OUTPATIENT_CLINIC_OR_DEPARTMENT_OTHER): Payer: Medicare Other | Admitting: Lab

## 2013-03-30 ENCOUNTER — Ambulatory Visit: Payer: Medicare Other | Admitting: Hematology & Oncology

## 2013-03-30 ENCOUNTER — Other Ambulatory Visit: Payer: Medicare Other | Admitting: Lab

## 2013-03-30 ENCOUNTER — Ambulatory Visit (HOSPITAL_COMMUNITY)
Admission: RE | Admit: 2013-03-30 | Discharge: 2013-03-30 | Disposition: A | Discharge status: Home / Self Care | Payer: Medicare Other | Source: Ambulatory Visit | Attending: Hematology & Oncology | Admitting: Hematology & Oncology

## 2013-03-30 VITALS — BP 133/78 | HR 86 | Temp 98.2°F | Resp 18

## 2013-03-30 VITALS — BP 107/56 | HR 93 | Temp 97.6°F | Resp 18 | Ht 67.0 in | Wt 154.0 lb

## 2013-03-30 DIAGNOSIS — D649 Anemia, unspecified: Secondary | ICD-10-CM | POA: Insufficient documentation

## 2013-03-30 DIAGNOSIS — C8590 Non-Hodgkin lymphoma, unspecified, unspecified site: Secondary | ICD-10-CM

## 2013-03-30 DIAGNOSIS — D61818 Other pancytopenia: Secondary | ICD-10-CM

## 2013-03-30 DIAGNOSIS — C799 Secondary malignant neoplasm of unspecified site: Secondary | ICD-10-CM

## 2013-03-30 DIAGNOSIS — C8589 Other specified types of non-Hodgkin lymphoma, extranodal and solid organ sites: Secondary | ICD-10-CM

## 2013-03-30 LAB — CMP (CANCER CENTER ONLY)
ALBUMIN: 2.9 g/dL — AB (ref 3.3–5.5)
ALT(SGPT): 27 U/L (ref 10–47)
AST: 23 U/L (ref 11–38)
Alkaline Phosphatase: 84 U/L (ref 26–84)
BILIRUBIN TOTAL: 0.9 mg/dL (ref 0.20–1.60)
BUN, Bld: 13 mg/dL (ref 7–22)
CO2: 30 mEq/L (ref 18–33)
Calcium: 9.4 mg/dL (ref 8.0–10.3)
Chloride: 99 mEq/L (ref 98–108)
Creat: 0.8 mg/dl (ref 0.6–1.2)
Glucose, Bld: 114 mg/dL (ref 73–118)
POTASSIUM: 4.7 meq/L (ref 3.3–4.7)
Sodium: 136 mEq/L (ref 128–145)
Total Protein: 6.1 g/dL — ABNORMAL LOW (ref 6.4–8.1)

## 2013-03-30 LAB — CBC WITH DIFFERENTIAL (CANCER CENTER ONLY)
BASO#: 0 10*3/uL (ref 0.0–0.2)
BASO%: 0 % (ref 0.0–2.0)
EOS%: 8.5 % — AB (ref 0.0–7.0)
Eosinophils Absolute: 0 10*3/uL (ref 0.0–0.5)
HCT: 22.9 % — ABNORMAL LOW (ref 38.7–49.9)
HEMOGLOBIN: 7.7 g/dL — AB (ref 13.0–17.1)
LYMPH#: 0.1 10*3/uL — ABNORMAL LOW (ref 0.9–3.3)
LYMPH%: 21.3 % (ref 14.0–48.0)
MCH: 31.6 pg (ref 28.0–33.4)
MCHC: 33.6 g/dL (ref 32.0–35.9)
MCV: 94 fL (ref 82–98)
MONO#: 0 10*3/uL — ABNORMAL LOW (ref 0.1–0.9)
MONO%: 4.3 % (ref 0.0–13.0)
NEUT%: 65.9 % (ref 40.0–80.0)
NEUTROS ABS: 0.3 10*3/uL — AB (ref 1.5–6.5)
Platelets: 8 10*3/uL — CL (ref 145–400)
RBC: 2.44 10*6/uL — ABNORMAL LOW (ref 4.20–5.70)
RDW: 13.9 % (ref 11.1–15.7)
WBC: 0.5 10*3/uL — CL (ref 4.0–10.0)

## 2013-03-30 MED ORDER — ACETAMINOPHEN 325 MG PO TABS
650.0000 mg | ORAL_TABLET | Freq: Once | ORAL | Status: AC
  Administered 2013-03-30: 650 mg via ORAL

## 2013-03-30 MED ORDER — SODIUM CHLORIDE 0.9 % IV SOLN
INTRAVENOUS | Status: DC
  Administered 2013-03-30: 13:00:00 via INTRAVENOUS

## 2013-03-30 MED ORDER — DIPHENHYDRAMINE HCL 25 MG PO CAPS
ORAL_CAPSULE | ORAL | Status: AC
  Filled 2013-03-30: qty 1

## 2013-03-30 MED ORDER — FLUCONAZOLE IN SODIUM CHLORIDE 200-0.9 MG/100ML-% IV SOLN
400.0000 mg | Freq: Once | INTRAVENOUS | Status: AC
  Administered 2013-03-30: 400 mg via INTRAVENOUS
  Filled 2013-03-30: qty 200

## 2013-03-30 MED ORDER — DIPHENHYDRAMINE HCL 25 MG PO CAPS
25.0000 mg | ORAL_CAPSULE | Freq: Once | ORAL | Status: AC
  Administered 2013-03-30: 25 mg via ORAL

## 2013-03-30 MED ORDER — ACETAMINOPHEN 325 MG PO TABS
ORAL_TABLET | ORAL | Status: AC
  Filled 2013-03-30: qty 2

## 2013-03-30 MED ORDER — FLUCONAZOLE IN SODIUM CHLORIDE 400-0.9 MG/200ML-% IV SOLN: 400.0000 mg | INTRAVENOUS | Status: DC

## 2013-03-30 MED ORDER — KETOCONAZOLE 200 MG PO TABS
200.0000 mg | ORAL_TABLET | Freq: Every day | ORAL | Status: DC
Start: 2013-03-30 — End: 2013-04-20

## 2013-03-30 NOTE — Addendum Note (Signed)
Addended by: Volanda Napoleon on: 03/30/2013 04:22 PM   Modules accepted: Orders

## 2013-03-30 NOTE — Progress Notes (Signed)
Hematology and Oncology Follow Up Visit  Keith Duffy [id number] Dec 25, 1945 68 y.o. 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  #2 status post DepoCyt injection into his Ommaya reservoir     Interim History:  Keith Duffy is in for in a scheduled visit. His wife called last night. He has not sores. He has been on Diflucan. He is also on acyclovir and Levaquin. He is about 2 weeks out from his chemotherapy and DepoCyt injection.  He is weak. He's a little dehydrated. He's not eating that much because of the mouth sores.  He's had no fever. He's had some nosebleeds. He's had no cough.  His performance status is ECOG 2-3. His blood sugars have actually been a little on the low side. He's had no diarrhea. 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 He seems to be closing his eye a little bit better. 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  He's had no nausea vomiting.  Medications: Current outpatient prescriptions:acetaminophen (TYLENOL) 500 MG tablet, Take 1,000 mg by mouth daily as needed (pain). , Disp: , Rfl: ;  artificial tears (LACRILUBE) OINT ophthalmic ointment, Place into the right eye every 8 (eight) hours., Disp: 1 Tube, Rfl: 4;  docusate sodium 100 MG CAPS, Take 100 mg by mouth 2 (two) times daily., Disp: 60 capsule, Rfl: 4 famciclovir (FAMVIR) 250 MG tablet, Take 250 mg by mouth daily. To prevent shingles, Disp: , Rfl: ;  fentaNYL (DURAGESIC - DOSED MCG/HR) 12 MCG/HR, Place 1 patch (12.5 mcg total) onto the skin every other day., Disp: 15 patch, Rfl: 0;  fluconazole (DIFLUCAN) 100 MG tablet, Take 1 tablet (100 mg total) by mouth daily., Disp: 30 tablet, Rfl: 4 insulin aspart (NOVOLOG FLEXPEN) 100 UNIT/ML SOPN FlexPen, Inject 0-9 Units into the skin 3 (three) times daily with meals. CBG < 70: eat or drink something sweet & recheck blood sugar. CBG 70 - 120: 0 units  CBG 121 - 150: 1  unit  CBG 151 - 200: 2 units  CBG 201 - 250: 3 units  CBG 251 - 300: 5 units  CBG 301 - 350: 7 units  CBG 351 - 400: 9 units  CBG > 400: call MD, Disp: 5 pen, Rfl: 0 Insulin Glargine (LANTUS SOLOSTAR) 100 UNIT/ML Solostar Pen, Inject 25 Units into the skin at bedtime., Disp: , Rfl: ;  levofloxacin (LEVAQUIN) 500 MG tablet, Take 1 tablet (500 mg total) by mouth daily., Disp: 14 tablet, Rfl: 4;  LORazepam (ATIVAN) 0.5 MG tablet, Take 1 tablet (0.5 mg total) by mouth every 6 (six) hours as needed (nausea and vomiting)., Disp: 60 tablet, Rfl: 0 meloxicam (MOBIC) 15 MG tablet, Take 1 tablet (15 mg total) by mouth every morning., Disp: 30 tablet, Rfl: 6;  metoprolol tartrate (LOPRESSOR) 25 MG tablet, Take 25 mg by mouth 2 (two) times daily. 1 tablet every morning and 1 tablet at lunch, Disp: , Rfl: ;  ondansetron (ZOFRAN) 8 MG tablet, Take 1 tablet (8 mg total) by mouth 2 (two) times daily as needed (Nausea or vomiting)., Disp: 30 tablet, Rfl: 1 ONE TOUCH ULTRA TEST test strip, , Disp: , Rfl: ;  Oxycodone HCl 10 MG TABS, Take 10-20 mg by mouth every 4 (four) hours as needed (breakthrough pain)., Disp: , Rfl: ;  prochlorperazine (COMPAZINE) 10 MG tablet, Take 1 tablet (10 mg total) by mouth every 6 (  six) hours as needed (Nausea or vomiting)., Disp: 30 tablet, Rfl: 1;  Pyridoxine HCl (VITAMIN B-6) 250 MG tablet, Take 1 tablet (250 mg total) by mouth daily., Disp: 30 tablet, Rfl: 12 sodium bicarbonate/sodium chloride SOLN, 1 application by Mouth Rinse route 6 (six) times daily., Disp: 1000 mL, Rfl: 4;  tamsulosin (FLOMAX) 0.4 MG CAPS capsule, Take 1 capsule (0.4 mg total) by mouth daily after breakfast., Disp: 30 capsule, Rfl: 4 Current facility-administered medications:0.9 %  sodium chloride infusion, , Intravenous, Continuous, Volanda Napoleon, MD  Allergies:  Allergies  Allergen Reactions  . Aspartame And Phenylalanine Diarrhea  . Codeine Other (See Comments)    "Made me go wild when I was young"    Past  Medical History, Surgical history, Social history, and Family History were reviewed and updated.  Review of Systems: As above  Physical Exam:  height is 5\' 7"  (1.702 m) and weight is 154 lb (69.854 kg). His oral temperature is 97.6 F (36.4 C). His blood pressure is 107/56 and his pulse is 93. His respiration is 18.   This is a chronically ill-appearing white count. He is alert and oriented. 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 He does have some mucositis, mostly on the right buccal mucosa. This may be fungal. There is no ulcerations. No adenopathy noted in the neck. His Ommaya site is intact. Lungs are clear. Cardiac exam regular rate and rhythm. There is no murmurs rubs or bruits. Abdomen is soft. He has decent bowel sounds. There is no guarding or rebound tenderness. Extremities shows bilateral weakness in his legs. This is chronic but a little bit worse today. Skin exam shows dry skin. He has some scattered ecchymoses. There is some petechia. Neurological exam shows the above-mentioned neurological deficits.  Lab Results  Component Value Date   WBC 0.5* 03/30/2013   HGB 7.7* 03/30/2013   HCT 22.9* 03/30/2013   MCV 94 03/30/2013   PLT 8* 03/30/2013     Chemistry      Component Value Date/Time   NA 136 03/30/2013 1103   NA 135* 03/22/2013 0615   K 4.7 03/30/2013 1103   K 4.7 03/22/2013 0615   CL 99 03/30/2013 1103   CL 99 03/22/2013 0615   CO2 30 03/30/2013 1103   CO2 23 03/22/2013 0615   BUN 13 03/30/2013 1103   BUN 25* 03/22/2013 0615   CREATININE 0.8 03/30/2013 1103   CREATININE 0.95 03/22/2013 0615      Component Value Date/Time   CALCIUM 9.4 03/30/2013 1103   CALCIUM 9.0 03/22/2013 0615   ALKPHOS 84 03/30/2013 1103   ALKPHOS 107 03/12/2013 1515   AST 23 03/30/2013 1103   AST 13 03/12/2013 1515   ALT 27 03/30/2013 1103   ALT 16 03/12/2013 1515   BILITOT 0.90 03/30/2013 1103   BILITOT 0.5 03/12/2013 1515         Impression and Plan: Keith Duffy is a 67 year old gentleman with  CNS relapse of his large cell lymphoma. He's had one cycle of treatment. He now has a marked pancytopenia. He did get Neulasta last week. He is on antibiotic and antifungal and antiviral prophylaxis.  He needs platelet transfusion. We will do this in the office today.  I don't think he needs a blood transfusion but this certainly might be something to think about if he does not improve his performance status.  We're trying to keep him out of the hospital. As such, we will do as much as  we can in our office.  As always, his wife is incredibly diligent.  We will try some ketoconazole. This has helped him in the past.  We may want to get him back in a couple of days so we can followup and see how he is doing.    Volanda Napoleon, MD 2/10/20152:27 PM

## 2013-03-30 NOTE — Patient Instructions (Signed)
Platelet Transfusion Information This is information about transfusions of platelets. Platelets are tiny cells made by the bone marrow and found in the blood. When a blood vessel is damaged platelets rush to the damaged area to help form a clot. This begins the healing process. When platelets get very low your blood may have trouble clotting. This may be from:  Illness.  Blood disorder.  Chemotherapy to treat cancer. Often lower platelet counts do not usually cause problems.  Platelets usually last for 7 to 10 days. If they are not used not used in an injury, they are broken down by the liver or spleen. Symptoms of low platelet count include:  Nosebleeds.  Bleeding gums.  Heavy periods.  Bruising and tiny blood spots in the skin.  Pin point spots of bleeding are called (petechiae).  Larger bruises (purpura).  Bleeding can be more serious if it happens in the brain or bowel. Platelet transfusions are often used to keep the platelet count at an acceptable level. Serious bleeding due to low platelets is uncommon. RISKS AND COMPLICATIONS Severe side effects from platelet transfusions are uncommon. Minor reactions may include:  Itching.  Rashes.  High temperature and shivering. Medications are available to stop transfusion reactions. Let your caregivers know if you develop any of the above problems.  If you are having platelet transfusions frequently they may get less effective. This is called becoming refractory to platelets. It is uncommon. This can happen from non-immune causes and immune causes. Non-immune causes include:  High temperatures.  Some medications.  An enlarged spleen. Immune causes happen when your body discovers the platelets are not your own and begin making antibodies against them. The antibodies kill the platelets quickly. Even with platelet transfusions you may still notice problems with bleeding or bruising. Let your caregivers know about this. Other things  can be done to help if this happens.  BEFORE THE PROCEDURE   Your doctors will check your platelet count regularly.  If the platelet count is too low it may be necessary to have a platelet transfusion.  This is more important before certain procedures with a risk of bleeding such as a spinal tap.  Platelet transfusion reduces the risk of bleeding during or after the procedure.  Except in emergencies, giving a transfusion requires a written consent. Before blood is taken from a donor, a complete history is taken to make sure the person has no history of previous diseases, nor engages in risky social behavior. Examples of this are intravenous drug use or sexual activity with multiple partners. This could lead to infected blood or blood products being used. This history is done even in spite of the extensive testing to make sure the blood is safe. All blood products transfused are tested to make sure it is a match for the person getting the blood. It is also checked for infections. Blood is the safest it has ever been. The risk of getting an infection is very low. PROCEDURE  The platelets are stored in small plastic bags which are kept at a low temperature.  Each bag is called a unit and sometimes two units are given. They are given through an intravenous line by drip infusion over about one half hour.  Usually blood is collected from multiple people to get enough to transfuse.  Sometimes, the platelets are collected from a single person. This is done using a special machine that separates the platelets from the blood. The machine is called an apheresis machine. Platelets collected   in this way are called apheresed platelets. Apheresed platelets reduce the risk of becoming sensitive to the platelets. This lowers the chances of having a transfusion reaction.  As it only takes a short time to give the platelets, this treatment can be given in an outpatients department. Platelets can also be given  before or after other treatments. SEEK IMMEDIATE MEDICAL CARE IF: Any of the following symptoms over the next 12 hours or several days:  Shaking chills.  Fever with a temperature greater than 102 F (38.9 C) develops.  Back pain or muscle pain.  People around you feel you are not acting correctly, or you are confused.  Blood in the urine or bowel movements or bleeding from any place in your body.  Shortness of breath, or difficulty breathing.  Dizziness.  Fainting.  You break out in a rash or develop hives.  You have a decrease in the amount of urine you are putting out, or the urine turns a dark color or changes to pink, red, or brown.  A severe headache or stiff neck.  Bruising more easily. Document Released: 12/02/2006 Document Revised: 04/29/2011 Document Reviewed: 12/02/2006 [employer] Patient Information 2014 [employer].  

## 2013-03-31 ENCOUNTER — Telehealth: Payer: Self-pay | Admitting: Hematology & Oncology

## 2013-03-31 LAB — PREPARE PLATELET PHERESIS: Unit division: 0

## 2013-03-31 NOTE — Telephone Encounter (Signed)
Pt aware of 2-12 lab

## 2013-04-01 ENCOUNTER — Encounter: Payer: Self-pay | Admitting: Hematology & Oncology

## 2013-04-01 ENCOUNTER — Ambulatory Visit (HOSPITAL_BASED_OUTPATIENT_CLINIC_OR_DEPARTMENT_OTHER): Payer: Medicare Other | Admitting: Lab

## 2013-04-01 ENCOUNTER — Ambulatory Visit (HOSPITAL_BASED_OUTPATIENT_CLINIC_OR_DEPARTMENT_OTHER): Payer: Medicare Other

## 2013-04-01 VITALS — BP 138/85 | HR 83 | Temp 97.3°F | Resp 20

## 2013-04-01 DIAGNOSIS — R5381 Other malaise: Secondary | ICD-10-CM

## 2013-04-01 DIAGNOSIS — R5383 Other fatigue: Secondary | ICD-10-CM

## 2013-04-01 DIAGNOSIS — C8589 Other specified types of non-Hodgkin lymphoma, extranodal and solid organ sites: Secondary | ICD-10-CM

## 2013-04-01 DIAGNOSIS — C8590 Non-Hodgkin lymphoma, unspecified, unspecified site: Secondary | ICD-10-CM

## 2013-04-01 DIAGNOSIS — C719 Malignant neoplasm of brain, unspecified: Secondary | ICD-10-CM

## 2013-04-01 DIAGNOSIS — C799 Secondary malignant neoplasm of unspecified site: Secondary | ICD-10-CM

## 2013-04-01 LAB — CMP (CANCER CENTER ONLY)
ALT: 23 U/L (ref 10–47)
AST: 18 U/L (ref 11–38)
Albumin: 2.6 g/dL — ABNORMAL LOW (ref 3.3–5.5)
Alkaline Phosphatase: 78 U/L (ref 26–84)
BUN, Bld: 12 mg/dL (ref 7–22)
CALCIUM: 9 mg/dL (ref 8.0–10.3)
CHLORIDE: 101 meq/L (ref 98–108)
CO2: 31 mEq/L (ref 18–33)
Creat: 0.8 mg/dl (ref 0.6–1.2)
Glucose, Bld: 165 mg/dL — ABNORMAL HIGH (ref 73–118)
Potassium: 4 mEq/L (ref 3.3–4.7)
SODIUM: 135 meq/L (ref 128–145)
Total Bilirubin: 0.6 mg/dl (ref 0.20–1.60)
Total Protein: 5.9 g/dL — ABNORMAL LOW (ref 6.4–8.1)

## 2013-04-01 LAB — CBC WITH DIFFERENTIAL (CANCER CENTER ONLY)
BASO#: 0 10*3/uL (ref 0.0–0.2)
BASO%: 2.2 % — AB (ref 0.0–2.0)
EOS%: 6.5 % (ref 0.0–7.0)
Eosinophils Absolute: 0 10*3/uL (ref 0.0–0.5)
HCT: 19.4 % — ABNORMAL LOW (ref 38.7–49.9)
HGB: 6.4 g/dL — CL (ref 13.0–17.1)
LYMPH#: 0.1 10*3/uL — ABNORMAL LOW (ref 0.9–3.3)
LYMPH%: 19.6 % (ref 14.0–48.0)
MCH: 30.9 pg (ref 28.0–33.4)
MCHC: 33 g/dL (ref 32.0–35.9)
MCV: 94 fL (ref 82–98)
MONO#: 0 10*3/uL — AB (ref 0.1–0.9)
MONO%: 4.3 % (ref 0.0–13.0)
NEUT%: 67.4 % (ref 40.0–80.0)
NEUTROS ABS: 0.3 10*3/uL — AB (ref 1.5–6.5)
PLATELETS: 46 10*3/uL — AB (ref 145–400)
RBC: 2.07 10*6/uL — ABNORMAL LOW (ref 4.20–5.70)
RDW: 14 % (ref 11.1–15.7)
WBC: 0.5 10*3/uL — CL (ref 4.0–10.0)

## 2013-04-01 LAB — PREPARE RBC (CROSSMATCH)

## 2013-04-01 LAB — HOLD TUBE, BLOOD BANK - CHCC SATELLITE

## 2013-04-01 MED ORDER — SODIUM CHLORIDE 0.9 % IJ SOLN
10.0000 mL | INTRAMUSCULAR | Status: AC | PRN
  Administered 2013-04-01: 10 mL
  Filled 2013-04-01: qty 10

## 2013-04-01 MED ORDER — HEPARIN SOD (PORK) LOCK FLUSH 100 UNIT/ML IV SOLN
500.0000 [IU] | Freq: Every day | INTRAVENOUS | Status: AC | PRN
  Administered 2013-04-01: 500 [IU]
  Filled 2013-04-01: qty 5

## 2013-04-01 MED ORDER — DIPHENHYDRAMINE HCL 25 MG PO CAPS
ORAL_CAPSULE | ORAL | Status: AC
  Filled 2013-04-01: qty 1

## 2013-04-01 MED ORDER — SODIUM CHLORIDE 0.9 % IV SOLN
Freq: Once | INTRAVENOUS | Status: AC
  Administered 2013-04-01: 10:00:00 via INTRAVENOUS

## 2013-04-01 MED ORDER — ACETAMINOPHEN 325 MG PO TABS
650.0000 mg | ORAL_TABLET | Freq: Once | ORAL | Status: AC
  Administered 2013-04-01: 650 mg via ORAL

## 2013-04-01 MED ORDER — FUROSEMIDE 10 MG/ML IJ SOLN
20.0000 mg | Freq: Once | INTRAMUSCULAR | Status: AC
  Administered 2013-04-01: 10 mg via INTRAVENOUS

## 2013-04-01 MED ORDER — ACETAMINOPHEN 325 MG PO TABS
ORAL_TABLET | ORAL | Status: AC
  Filled 2013-04-01: qty 2

## 2013-04-01 MED ORDER — FUROSEMIDE 10 MG/ML IJ SOLN
INTRAMUSCULAR | Status: AC
  Filled 2013-04-01: qty 4

## 2013-04-01 MED ORDER — DIPHENHYDRAMINE HCL 25 MG PO CAPS
25.0000 mg | ORAL_CAPSULE | Freq: Once | ORAL | Status: AC
  Administered 2013-04-01: 25 mg via ORAL

## 2013-04-01 NOTE — Patient Instructions (Signed)
Blood Transfusion Information WHAT IS A BLOOD TRANSFUSION? A transfusion is the replacement of blood or some of its parts. Blood is made up of multiple cells which provide different functions.  Red blood cells carry oxygen and are used for blood loss replacement.  White blood cells fight against infection.  Platelets control bleeding.  Plasma helps clot blood.  Other blood products are available for specialized needs, such as hemophilia or other clotting disorders. BEFORE THE TRANSFUSION  Who gives blood for transfusions?   You may be able to donate blood to be used at a later date on yourself (autologous donation).  Relatives can be asked to donate blood. This is generally not any safer than if you have received blood from a stranger. The same precautions are taken to ensure safety when a relative's blood is donated.  Healthy volunteers who are fully evaluated to make sure their blood is safe. This is blood bank blood. Transfusion therapy is the safest it has ever been in the practice of medicine. Before blood is taken from a donor, a complete history is taken to make sure that person has no history of diseases nor engages in risky social behavior (examples are intravenous drug use or sexual activity with multiple partners). The donor's travel history is screened to minimize risk of transmitting infections, such as malaria. The donated blood is tested for signs of infectious diseases, such as HIV and hepatitis. The blood is then tested to be sure it is compatible with you in order to minimize the chance of a transfusion reaction. If you or a relative donates blood, this is often done in anticipation of surgery and is not appropriate for emergency situations. It takes many days to process the donated blood. RISKS AND COMPLICATIONS Although transfusion therapy is very safe and saves many lives, the main dangers of transfusion include:   Getting an infectious disease.  Developing a  transfusion reaction. This is an allergic reaction to something in the blood you were given. Every precaution is taken to prevent this. The decision to have a blood transfusion has been considered carefully by your caregiver before blood is given. Blood is not given unless the benefits outweigh the risks. AFTER THE TRANSFUSION  Right after receiving a blood transfusion, you will usually feel much better and more energetic. This is especially true if your red blood cells have gotten low (anemic). The transfusion raises the level of the red blood cells which carry oxygen, and this usually causes an energy increase.  The nurse administering the transfusion will monitor you carefully for complications. HOME CARE INSTRUCTIONS  No special instructions are needed after a transfusion. You may find your energy is better. Speak with your caregiver about any limitations on activity for underlying diseases you may have. SEEK MEDICAL CARE IF:   Your condition is not improving after your transfusion.  You develop redness or irritation at the intravenous (IV) site. SEEK IMMEDIATE MEDICAL CARE IF:  Any of the following symptoms occur over the next 12 hours:  Shaking chills.  You have a temperature by mouth above 102 F (38.9 C), not controlled by medicine.  Chest, back, or muscle pain.  People around you feel you are not acting correctly or are confused.  Shortness of breath or difficulty breathing.  Dizziness and fainting.  You get a rash or develop hives.  You have a decrease in urine output.  Your urine turns a dark color or changes to pink, red, or brown. Any of the following   symptoms occur over the next 10 days:  You have a temperature by mouth above 102 F (38.9 C), not controlled by medicine.  Shortness of breath.  Weakness after normal activity.  The white part of the eye turns yellow (jaundice).  You have a decrease in the amount of urine or are urinating less often.  Your  urine turns a dark color or changes to pink, red, or brown. Document Released: 02/02/2000 Document Revised: 04/29/2011 Document Reviewed: 09/21/2007 [employer] Patient Information 2014 [employer].  

## 2013-04-02 ENCOUNTER — Encounter (HOSPITAL_COMMUNITY): Payer: Self-pay

## 2013-04-02 ENCOUNTER — Encounter: Payer: Self-pay | Admitting: Hematology & Oncology

## 2013-04-02 ENCOUNTER — Encounter (HOSPITAL_COMMUNITY)
Admission: RE | Admit: 2013-04-02 | Discharge: 2013-04-02 | Disposition: A | Discharge status: Home / Self Care | Payer: Medicare Other | Source: Ambulatory Visit | Attending: Hematology & Oncology | Admitting: Hematology & Oncology

## 2013-04-02 DIAGNOSIS — C8589 Other specified types of non-Hodgkin lymphoma, extranodal and solid organ sites: Secondary | ICD-10-CM | POA: Insufficient documentation

## 2013-04-02 DIAGNOSIS — C8590 Non-Hodgkin lymphoma, unspecified, unspecified site: Secondary | ICD-10-CM

## 2013-04-02 LAB — GLUCOSE, CAPILLARY: Glucose-Capillary: 102 mg/dL — ABNORMAL HIGH (ref 70–99)

## 2013-04-02 LAB — TYPE AND SCREEN
ABO/RH(D): O POS
Antibody Screen: NEGATIVE
UNIT DIVISION: 0
UNIT DIVISION: 0

## 2013-04-02 MED ORDER — FLUDEOXYGLUCOSE F - 18 (FDG) INJECTION
9.0000 | Freq: Once | INTRAVENOUS | Status: AC | PRN
  Administered 2013-04-02: 9 via INTRAVENOUS

## 2013-04-06 ENCOUNTER — Ambulatory Visit: Payer: Medicare Other | Admitting: Hematology & Oncology

## 2013-04-06 ENCOUNTER — Ambulatory Visit: Payer: Medicare Other

## 2013-04-06 ENCOUNTER — Other Ambulatory Visit: Payer: Self-pay | Admitting: Nurse Practitioner

## 2013-04-06 ENCOUNTER — Other Ambulatory Visit: Payer: Medicare Other | Admitting: Lab

## 2013-04-06 DIAGNOSIS — C8590 Non-Hodgkin lymphoma, unspecified, unspecified site: Secondary | ICD-10-CM

## 2013-04-06 DIAGNOSIS — C799 Secondary malignant neoplasm of unspecified site: Secondary | ICD-10-CM

## 2013-04-07 ENCOUNTER — Encounter: Payer: Self-pay | Admitting: Hematology & Oncology

## 2013-04-07 ENCOUNTER — Ambulatory Visit (HOSPITAL_BASED_OUTPATIENT_CLINIC_OR_DEPARTMENT_OTHER): Payer: Medicare Other

## 2013-04-07 ENCOUNTER — Other Ambulatory Visit (HOSPITAL_BASED_OUTPATIENT_CLINIC_OR_DEPARTMENT_OTHER): Payer: Medicare Other | Admitting: Lab

## 2013-04-07 ENCOUNTER — Other Ambulatory Visit (HOSPITAL_COMMUNITY)
Admission: RE | Admit: 2013-04-07 | Discharge: 2013-04-07 | Disposition: A | Discharge status: Home / Self Care | Payer: Medicare Other | Source: Ambulatory Visit | Attending: Hematology & Oncology | Admitting: Hematology & Oncology

## 2013-04-07 ENCOUNTER — Ambulatory Visit (HOSPITAL_BASED_OUTPATIENT_CLINIC_OR_DEPARTMENT_OTHER): Payer: Medicare Other | Admitting: Hematology & Oncology

## 2013-04-07 VITALS — BP 119/64 | HR 74 | Temp 97.9°F | Resp 18 | Ht 69.0 in | Wt 155.0 lb

## 2013-04-07 DIAGNOSIS — C801 Malignant (primary) neoplasm, unspecified: Principal | ICD-10-CM

## 2013-04-07 DIAGNOSIS — C799 Secondary malignant neoplasm of unspecified site: Secondary | ICD-10-CM

## 2013-04-07 DIAGNOSIS — C8339 Primary central nervous system lymphoma: Secondary | ICD-10-CM

## 2013-04-07 DIAGNOSIS — Z5111 Encounter for antineoplastic chemotherapy: Secondary | ICD-10-CM

## 2013-04-07 DIAGNOSIS — C7949 Secondary malignant neoplasm of other parts of nervous system: Secondary | ICD-10-CM

## 2013-04-07 DIAGNOSIS — C8590 Non-Hodgkin lymphoma, unspecified, unspecified site: Secondary | ICD-10-CM

## 2013-04-07 DIAGNOSIS — C8589 Other specified types of non-Hodgkin lymphoma, extranodal and solid organ sites: Secondary | ICD-10-CM | POA: Insufficient documentation

## 2013-04-07 LAB — CMP (CANCER CENTER ONLY)
ALBUMIN: 2.9 g/dL — AB (ref 3.3–5.5)
ALK PHOS: 89 U/L — AB (ref 26–84)
ALT(SGPT): 54 U/L — ABNORMAL HIGH (ref 10–47)
AST: 39 U/L — AB (ref 11–38)
BILIRUBIN TOTAL: 0.6 mg/dL (ref 0.20–1.60)
BUN: 11 mg/dL (ref 7–22)
CO2: 30 mEq/L (ref 18–33)
Calcium: 9.1 mg/dL (ref 8.0–10.3)
Chloride: 103 mEq/L (ref 98–108)
Creat: 1 mg/dl (ref 0.6–1.2)
Glucose, Bld: 136 mg/dL — ABNORMAL HIGH (ref 73–118)
POTASSIUM: 3.4 meq/L (ref 3.3–4.7)
Sodium: 140 mEq/L (ref 128–145)
Total Protein: 6.1 g/dL — ABNORMAL LOW (ref 6.4–8.1)

## 2013-04-07 LAB — CBC WITH DIFFERENTIAL (CANCER CENTER ONLY)
BASO#: 0 10*3/uL (ref 0.0–0.2)
BASO%: 0 % (ref 0.0–2.0)
EOS%: 1.7 % (ref 0.0–7.0)
Eosinophils Absolute: 0 10*3/uL (ref 0.0–0.5)
HCT: 29.3 % — ABNORMAL LOW (ref 38.7–49.9)
HGB: 10 g/dL — ABNORMAL LOW (ref 13.0–17.1)
LYMPH#: 0.2 10*3/uL — ABNORMAL LOW (ref 0.9–3.3)
LYMPH%: 10.9 % — ABNORMAL LOW (ref 14.0–48.0)
MCH: 31 pg (ref 28.0–33.4)
MCHC: 34.1 g/dL (ref 32.0–35.9)
MCV: 91 fL (ref 82–98)
MONO#: 0.2 10*3/uL (ref 0.1–0.9)
MONO%: 9.7 % (ref 0.0–13.0)
NEUT%: 77.7 % (ref 40.0–80.0)
NEUTROS ABS: 1.4 10*3/uL — AB (ref 1.5–6.5)
Platelets: 74 10*3/uL — ABNORMAL LOW (ref 145–400)
RBC: 3.23 10*6/uL — ABNORMAL LOW (ref 4.20–5.70)
RDW: 15.5 % (ref 11.1–15.7)
WBC: 1.8 10*3/uL — ABNORMAL LOW (ref 4.0–10.0)

## 2013-04-07 LAB — TECHNOLOGIST REVIEW CHCC SATELLITE

## 2013-04-07 LAB — HOLD TUBE, BLOOD BANK - CHCC SATELLITE

## 2013-04-07 MED ORDER — CYTARABINE LIPOSOME CHEMO INTRATHECAL 50 MG/5ML
50.0000 mg | Freq: Once | INTRATHECAL | Status: AC
  Administered 2013-04-07: 50 mg via INTRATHECAL
  Filled 2013-04-07: qty 5

## 2013-04-07 NOTE — Progress Notes (Signed)
Pt was seen by Dr. Marin Olp today. Received Cytarabine via Ommaya reservoir per Dr. Marin Olp.  Tol well, dsg applied to site with pressure dressing and kerlex.  Instructed to remove in about 4 hours.  Verbalized understanding.

## 2013-04-07 NOTE — Patient Instructions (Signed)
Cytarabine Liposomal intrathecal injection What is this medicine? CYTARABINE LIPOSOMAL (sye TARE a been LIP oh som al) is a chemotherapy drug that is given into the spinal fluid. It is used to treat lymphoma and other cancers that are in the fluid surrounding the brain. This medicine may be used for other purposes; ask your health care provider or pharmacist if you have questions. COMMON BRAND NAME(S): DepoCyt What should I tell my health care provider before I take this medicine? They need to know if you have any of these conditions: -infection -recent or ongoing radiation therapy -an unusual or allergic reaction to cytarabine or ARA-C, other medicines, foods, dyes, or preservatives -pregnant or trying to get pregnant -breast-feeding How should I use this medicine? This medicine is only for injection into the spinal fluid. It is administered in a hospital or clinic by a doctor or health care professional. Talk to your pediatrician regarding the use of this medicine in children. Special care may be needed. Overdosage: If you think you have taken too much of this medicine contact a poison control center or emergency room at once. NOTE: This medicine is only for you. Do not share this medicine with others. What if I miss a dose? It is important not to miss your dose. Call your doctor or health care professional if you are unable to keep an appointment. What may interact with this medicine? Interactions are not expected. This list may not describe all possible interactions. Give your health care provider a list of all the medicines, herbs, non-prescription drugs, or dietary supplements you use. Also tell them if you smoke, drink alcohol, or use illegal drugs. Some items may interact with your medicine. What should I watch for while using this medicine? Visit your doctor for checks on your progress. You will receive dexamethasone for 5 days starting on the day you receive this drug. Take the  dexamethasone exactly as directed as it will help to decrease some of the serious side effects of this drug. Women should inform their doctor if they wish to become pregnant or think they might be pregnant. There is a potential for serious side effects to an unborn child. Talk to your health care professional or pharmacist for more information. Do not breast-feed an infant while taking this medicine. What side effects may I notice from receiving this medicine? Side effects that you should report to your doctor or health care professional as soon as possible: -back pain -feeling faint or lightheaded, falls -fever -headache -neck stiffness and/or pain -seizures -unsteady in walking Side effects that usually do not require medical attention (report to your doctor or health care professional if they continue or are bothersome): -confusion -dizziness -feeling tired and weak This list may not describe all possible side effects. Call your doctor for medical advice about side effects. You may report side effects to FDA at 1-800-FDA-1088. Where should I keep my medicine? This drug is given in a hospital or clinic and will not be stored at home. NOTE: This sheet is a summary. It may not cover all possible information. If you have questions about this medicine, talk to your doctor, pharmacist, or health care provider.  2014, Elsevier/Gold Standard. (2007-05-12 14:48:24)

## 2013-04-07 NOTE — Progress Notes (Signed)
Hematology and Oncology Follow Up Visit  Keith Duffy [id number] [birth year] 68 y.o. 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     Interim History:  Mr.  Duffy is in for followup. He had a real hard time with the chemotherapy. He got profoundly pancytopenic. We had to transfuse him blood and platelets.  He still him a lot better now. He is eating a little bit better. There may be some better facial nerve function on the right. His swallowing okay.  There's no headaches. He's had no seizures. His blood sugars have been under good control.  We did repeat a PET scan on him. This did not show any obvious disease outside the spinal cord. There was some faint activity at L4. Again, no systemic activity was noted on PET scan.  He is getting around but slowly. There is no cough or shortness of breath. He had a mouth fungus but this is improved. No diarrhea noted. No leg swelling. He's had a some nausea with an occasional bout of emesis.  Currently, as before status is ECoG 2.  Medications: Current outpatient prescriptions:acetaminophen (TYLENOL) 500 MG tablet, Take 1,000 mg by mouth daily as needed (pain). , Disp: , Rfl: ;  artificial tears (LACRILUBE) OINT ophthalmic ointment, Place into the right eye every 8 (eight) hours., Disp: 1 Tube, Rfl: 4;  docusate sodium 100 MG CAPS, Take 100 mg by mouth 2 (two) times daily., Disp: 60 capsule, Rfl: 4 famciclovir (FAMVIR) 250 MG tablet, Take 250 mg by mouth daily. To prevent shingles, Disp: , Rfl: ;  fentaNYL (DURAGESIC - DOSED MCG/HR) 12 MCG/HR, Place 1 patch (12.5 mcg total) onto the skin every other day., Disp: 15 patch, Rfl: 0;  ketoconazole (NIZORAL) 200 MG tablet, Take 1 tablet (200 mg total) by mouth daily., Disp: 30 tablet, Rfl:  4 levofloxacin (LEVAQUIN) 500 MG tablet, Take 1 tablet (500 mg total) by mouth daily., Disp: 14 tablet, Rfl: 4;  LORazepam (ATIVAN) 0.5 MG tablet, Take 1 tablet (0.5 mg total) by mouth every 6 (six) hours as needed (nausea and vomiting)., Disp: 60 tablet, Rfl: 0;  meloxicam (MOBIC) 15 MG tablet, Take 1 tablet (15 mg total) by mouth every morning., Disp: 30 tablet, Rfl: 6 metoprolol tartrate (LOPRESSOR) 25 MG tablet, Take 25 mg by mouth 2 (two) times daily. 1 tablet every morning and 1 tablet at lunch, Disp: , Rfl: ;  ondansetron (ZOFRAN) 8 MG tablet, Take 1 tablet (8 mg total) by mouth 2 (two) times daily as needed (Nausea or vomiting)., Disp: 30 tablet, Rfl: 1;  ONE TOUCH ULTRA TEST test strip, , Disp: , Rfl:  Oxycodone HCl 10 MG TABS, Take 10-20 mg by mouth every 4 (four) hours as needed (breakthrough pain)., Disp: , Rfl: ;  Pyridoxine HCl (VITAMIN B-6) 250 MG tablet, Take 1 tablet (250 mg total) by mouth daily., Disp: 30 tablet, Rfl: 12;  sodium bicarbonate/sodium chloride SOLN, 1 application by Mouth Rinse route 6 (six) times daily., Disp: 1000 mL, Rfl: 4;  tamsulosin (FLOMAX) 0.4 MG CAPS capsule, Take 0.4 mg by mouth as needed., Disp: , Rfl:  insulin aspart (NOVOLOG) 100 UNIT/ML FlexPen, Inject into the skin 3 (three) times daily with meals. CBG < 70: eat or drink something sweet & recheck blood sugar.  HOLD CBG 70 - 120: 0 units  CBG 121 - 150: 1 unit  CBG 151 - 200: 2 units  CBG 201 - 250: 3 units  CBG 251 - 300: 5 units  CBG 301 - 350: 7 units  CBG 351 - 400: 9 units  CBG > 400: call MD, Disp: , Rfl:  Insulin Glargine (LANTUS SOLOSTAR) 100 UNIT/ML Solostar Pen, Inject 25 Units into the skin at bedtime. HOLD, Disp: , Rfl: ;  prochlorperazine (COMPAZINE) 10 MG tablet, Take 1 tablet (10 mg total) by mouth every 6 (six) hours as needed (Nausea or vomiting)., Disp: 30 tablet, Rfl: 1  Allergies:  Allergies  Allergen Reactions  . Aspartame And Phenylalanine Diarrhea  . Codeine Other (See Comments)     "Made me go wild when I was young"    Past Medical History, Surgical history, Social history, and Family History were reviewed and updated.  Review of Systems: As above  Physical Exam:  height is 5\' 9"  (1.753 m) and weight is 155 lb (70.308 kg). His oral temperature is 97.9 F (36.6 C). His blood pressure is 119/64 and his pulse is 74. His respiration is 18.   Somewhat chronically ill-appearing white gentleman in no obvious distress. Head and neck exam shows the Ommaya reservoir in the right frontal region. There is some erythema of a round the reservoir. It's not tender or warm. His right eye is closed a little bit better. No oral lesions are noted. No thrush is seen. No adenopathy in the neck. Lungs are clear. Cardiac exam regular rate and rhythm with no murmurs rubs or bruits. Abdomen is soft. Has good bowel sounds. There is no fluid wave. There is no palpable hepato- splenomegaly. Back exam no tenderness over spine ribs or hips. Extremities shows a left foot drop. Has good strength in his legs outside of a plantar flexion of his left foot. Has good pulses. Skin exam no rashes ecchymosis or petechia. Neurological exam shows the right cranial nerve 7 palsy and a left foot drop.  Lab Results  Component Value Date   WBC 1.8* 04/07/2013   HGB 10.0* 04/07/2013   HCT 29.3* 04/07/2013   MCV 91 04/07/2013   PLT 74* 04/07/2013     Chemistry      Component Value Date/Time   NA 140 04/07/2013 1209   NA 135* 03/22/2013 0615   K 3.4 04/07/2013 1209   K 4.7 03/22/2013 0615   CL 103 04/07/2013 1209   CL 99 03/22/2013 0615   CO2 30 04/07/2013 1209   CO2 23 03/22/2013 0615   BUN 11 04/07/2013 1209   BUN 25* 03/22/2013 0615   CREATININE 1.0 04/07/2013 1209   CREATININE 0.95 03/22/2013 0615      Component Value Date/Time   CALCIUM 9.1 04/07/2013 1209   CALCIUM 9.0 03/22/2013 0615   ALKPHOS 89* 04/07/2013 1209   ALKPHOS 107 03/12/2013 1515   AST 39* 04/07/2013 1209   AST 13 03/12/2013 1515   ALT 54* 04/07/2013 1209    ALT 16 03/12/2013 1515   BILITOT 0.60 04/07/2013 1209   BILITOT 0.5 03/12/2013 1515         Impression and Plan: Keith Duffy is a 68 year old gentleman. He has CNS relapse of that his lymphoma. He, thankfully, does not have systemic disease as far as the PET scan shows is.  I went ahead and pan installed 50 mg of DepoCyt into his Ommaya reservoir today. I did a sterilely. I removed 4 cc of  clear spinal fluid. This was sent off for flow cytometry and cytology. Hopefully, this will be negative for malignancy. We did put a small pressure dressing on the Ommaya site to help with bleeding. His platelet count was a little [place] was some bleeding after I took the needle out from the reservoir.  A to his wife to place on topical antibiotic ointment on the Ommaya site where it is slowly erythematous. It doesn't look infected but one never knows. I told her that if this area gets worse to call me and we will have to get him on oral may be given IV antibiotics.  I said that if his spinal fluid is now negative, I would seriously consider another round of systemic chemotherapy as I really believe that this is helping both the CNS lymphoma and any systemic disease. I would readjust his dosing to make it will bit easier for him. I told he would not have to be in the hospital nearly as long.  I will call him when I get the results back from his spinal fluid.  I want him back in 2 weeks so we can do another installation of DepoCyt into the Ommaya reservoir.   Volanda Napoleon, MD 2/18/20156:26 PM

## 2013-04-09 ENCOUNTER — Ambulatory Visit (HOSPITAL_BASED_OUTPATIENT_CLINIC_OR_DEPARTMENT_OTHER): Payer: Medicare Other | Admitting: Hematology & Oncology

## 2013-04-09 ENCOUNTER — Other Ambulatory Visit: Payer: Self-pay | Admitting: *Deleted

## 2013-04-09 ENCOUNTER — Other Ambulatory Visit (HOSPITAL_BASED_OUTPATIENT_CLINIC_OR_DEPARTMENT_OTHER): Payer: Medicare Other | Admitting: Lab

## 2013-04-09 ENCOUNTER — Ambulatory Visit (HOSPITAL_BASED_OUTPATIENT_CLINIC_OR_DEPARTMENT_OTHER): Payer: Medicare Other

## 2013-04-09 DIAGNOSIS — R112 Nausea with vomiting, unspecified: Secondary | ICD-10-CM

## 2013-04-09 DIAGNOSIS — R5381 Other malaise: Secondary | ICD-10-CM

## 2013-04-09 DIAGNOSIS — R5383 Other fatigue: Secondary | ICD-10-CM

## 2013-04-09 DIAGNOSIS — C8589 Other specified types of non-Hodgkin lymphoma, extranodal and solid organ sites: Secondary | ICD-10-CM

## 2013-04-09 DIAGNOSIS — E119 Type 2 diabetes mellitus without complications: Secondary | ICD-10-CM

## 2013-04-09 DIAGNOSIS — C801 Malignant (primary) neoplasm, unspecified: Principal | ICD-10-CM

## 2013-04-09 DIAGNOSIS — C8590 Non-Hodgkin lymphoma, unspecified, unspecified site: Secondary | ICD-10-CM

## 2013-04-09 DIAGNOSIS — R51 Headache: Secondary | ICD-10-CM

## 2013-04-09 DIAGNOSIS — R6883 Chills (without fever): Secondary | ICD-10-CM

## 2013-04-09 DIAGNOSIS — C7949 Secondary malignant neoplasm of other parts of nervous system: Secondary | ICD-10-CM

## 2013-04-09 LAB — CMP (CANCER CENTER ONLY)
ALBUMIN: 3.2 g/dL — AB (ref 3.3–5.5)
ALT(SGPT): 32 U/L (ref 10–47)
AST: 22 U/L (ref 11–38)
Alkaline Phosphatase: 85 U/L — ABNORMAL HIGH (ref 26–84)
BUN: 11 mg/dL (ref 7–22)
CALCIUM: 8.6 mg/dL (ref 8.0–10.3)
CO2: 28 mEq/L (ref 18–33)
Chloride: 101 mEq/L (ref 98–108)
Creat: 0.5 mg/dl — ABNORMAL LOW (ref 0.6–1.2)
GLUCOSE: 126 mg/dL — AB (ref 73–118)
POTASSIUM: 3.2 meq/L — AB (ref 3.3–4.7)
Sodium: 136 mEq/L (ref 128–145)
Total Bilirubin: 0.7 mg/dl (ref 0.20–1.60)
Total Protein: 6 g/dL — ABNORMAL LOW (ref 6.4–8.1)

## 2013-04-09 LAB — CBC WITH DIFFERENTIAL (CANCER CENTER ONLY)
BASO#: 0 10*3/uL (ref 0.0–0.2)
BASO%: 0.3 % (ref 0.0–2.0)
EOS%: 0 % (ref 0.0–7.0)
Eosinophils Absolute: 0 10*3/uL (ref 0.0–0.5)
HCT: 27.8 % — ABNORMAL LOW (ref 38.7–49.9)
HEMOGLOBIN: 9.5 g/dL — AB (ref 13.0–17.1)
LYMPH#: 0.2 10*3/uL — ABNORMAL LOW (ref 0.9–3.3)
LYMPH%: 5.7 % — ABNORMAL LOW (ref 14.0–48.0)
MCH: 31.1 pg (ref 28.0–33.4)
MCHC: 34.2 g/dL (ref 32.0–35.9)
MCV: 91 fL (ref 82–98)
MONO#: 0.3 10*3/uL (ref 0.1–0.9)
MONO%: 8.3 % (ref 0.0–13.0)
NEUT%: 85.7 % — ABNORMAL HIGH (ref 40.0–80.0)
NEUTROS ABS: 3 10*3/uL (ref 1.5–6.5)
Platelets: 87 10*3/uL — ABNORMAL LOW (ref 145–400)
RBC: 3.05 10*6/uL — ABNORMAL LOW (ref 4.20–5.70)
RDW: 16.1 % — ABNORMAL HIGH (ref 11.1–15.7)
WBC: 3.5 10*3/uL — ABNORMAL LOW (ref 4.0–10.0)

## 2013-04-09 LAB — LACTATE DEHYDROGENASE: LDH: 203 U/L (ref 94–250)

## 2013-04-09 LAB — WHOLE BLOOD GLUCOSE - CHCC SATELLITE: GLUCOSE: 121 mg/dL — AB (ref 70–99)

## 2013-04-09 MED ORDER — SODIUM CHLORIDE 0.9 % IV SOLN
Freq: Once | INTRAVENOUS | Status: AC
  Administered 2013-04-09: 11:00:00 via INTRAVENOUS

## 2013-04-09 MED ORDER — METOCLOPRAMIDE HCL 5 MG/ML IJ SOLN
20.0000 mg | Freq: Once | INTRAVENOUS | Status: AC
  Administered 2013-04-09: 20 mg via INTRAVENOUS
  Filled 2013-04-09: qty 4

## 2013-04-09 MED ORDER — DEXAMETHASONE 4 MG PO TABS: 4.0000 mg | ORAL_TABLET | Freq: Three times a day (TID) | ORAL | Status: DC

## 2013-04-09 MED ORDER — DEXAMETHASONE SODIUM PHOSPHATE 20 MG/5ML IJ SOLN
INTRAMUSCULAR | Status: AC
  Filled 2013-04-09: qty 10

## 2013-04-09 MED ORDER — DEXAMETHASONE SODIUM PHOSPHATE 20 MG/5ML IJ SOLN
40.0000 mg | Freq: Once | INTRAMUSCULAR | Status: AC
  Administered 2013-04-09: 40 mg via INTRAVENOUS

## 2013-04-09 NOTE — Patient Instructions (Signed)
Dehydration, Adult Dehydration is when you lose more fluids from the body than you take in. Vital organs like the kidneys, brain, and heart cannot function without a proper amount of fluids and salt. Any loss of fluids from the body can cause dehydration.  CAUSES   Vomiting.  Diarrhea.  Excessive sweating.  Excessive urine output.  Fever. SYMPTOMS  Mild dehydration  Thirst.  Dry lips.  Slightly dry mouth. Moderate dehydration  Very dry mouth.  Sunken eyes.  Skin does not bounce back quickly when lightly pinched and released.  Dark urine and decreased urine production.  Decreased tear production.  Headache. Severe dehydration  Very dry mouth.  Extreme thirst.  Rapid, weak pulse (more than 100 beats per minute at rest).  Cold hands and feet.  Not able to sweat in spite of heat and temperature.  Rapid breathing.  Blue lips.  Confusion and lethargy.  Difficulty being awakened.  Minimal urine production.  No tears. DIAGNOSIS  Your caregiver will diagnose dehydration based on your symptoms and your exam. Blood and urine tests will help confirm the diagnosis. The diagnostic evaluation should also identify the cause of dehydration. TREATMENT  Treatment of mild or moderate dehydration can often be done at home by increasing the amount of fluids that you drink. It is best to drink small amounts of fluid more often. Drinking too much at one time can make vomiting worse. Refer to the home care instructions below. Severe dehydration needs to be treated at the hospital where you will probably be given intravenous (IV) fluids that contain water and electrolytes. HOME CARE INSTRUCTIONS   Ask your caregiver about specific rehydration instructions.  Drink enough fluids to keep your urine clear or pale yellow.  Drink small amounts frequently if you have nausea and vomiting.  Eat as you normally do.  Avoid:  Foods or drinks high in sugar.  Carbonated  drinks.  Juice.  Extremely hot or cold fluids.  Drinks with caffeine.  Fatty, greasy foods.  Alcohol.  Tobacco.  Overeating.  Gelatin desserts.  Wash your hands well to avoid spreading bacteria and viruses.  Only take over-the-counter or prescription medicines for pain, discomfort, or fever as directed by your caregiver.  Ask your caregiver if you should continue all prescribed and over-the-counter medicines.  Keep all follow-up appointments with your caregiver. SEEK MEDICAL CARE IF:  You have abdominal pain and it increases or stays in one area (localizes).  You have a rash, stiff neck, or severe headache.  You are irritable, sleepy, or difficult to awaken.  You are weak, dizzy, or extremely thirsty. SEEK IMMEDIATE MEDICAL CARE IF:   You are unable to keep fluids down or you get worse despite treatment.  You have frequent episodes of vomiting or diarrhea.  You have blood or green matter (bile) in your vomit.  You have blood in your stool or your stool looks black and tarry.  You have not urinated in 6 to 8 hours, or you have only urinated a small amount of very dark urine.  You have a fever.  You faint. MAKE SURE YOU:   Understand these instructions.  Will watch your condition.  Will get help right away if you are not doing well or get worse. Document Released: 02/04/2005 Document Revised: 04/29/2011 Document Reviewed: 09/24/2010 ExitCare Patient Information 2014 [employer].  

## 2013-04-09 NOTE — Progress Notes (Signed)
Earlier in am, patients wife called stating that patient was running a low grade fever around 100.5 and throwing up all night long every 2 hours with complaints of neck pain and headaches. Dr. Marin Olp wanted patient to come in for IV fluids and IV meds.

## 2013-04-11 NOTE — Progress Notes (Signed)
DIAGNOSIS:  CNS relapse of NHL  CURRENT THERAPY:  1.  S/p 1 cycle of R-MTV                                         2. S/p 2 DepoCyt injections   INTERIM HISTORY:  Keith Duffy comes in for an unscheduled visit.  Keith Duffy had DepoCyt into the Ommaya resevoir on 2/18.     Keith Duffy now has c/o n/v, headache, chills, weakness.   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 His symptoms are c/w this.   Keith Duffy still has (+) NHL in the CNS.  There is still no change in the RIGHT facial nerve weakness nor the LEFT foot drop.  Blood sugars are ok.  No bleeding.  Appetite is down a little. No cough.   On his PE. Keith Duffy is afebrile.  HR is 84. BP is 119/64. No new neurological deficits.  (-) oral ulcers. Lungs are clear. Heart is RRR.  Ommaya site is w/o erythema or swelling or warmth.  Labs are ok.  We gave him Decadron and IV fluids.  Keith Duffy felt better. Keith Duffy will go home on oral Decadron for 3-4 days.  I talked with him at length about the CSF results.  I know that Keith Duffy only has had 1 cycle of therapy.  I told him and wife that we should try another cycle of R-MTV.  If CSF still (+), then we really will need to think about palliative care as I would not think any other regimen would help.  I know that stem cell transplant has been used but Keith Duffy is not strong enough for this.  Keith Duffy will think about further chemo.  Keith Duffy felt a lot better upon finishing fluids/steroids.

## 2013-04-12 ENCOUNTER — Encounter: Payer: Self-pay | Admitting: Nurse Practitioner

## 2013-04-12 NOTE — Progress Notes (Signed)
Called pt to see how he was feeling. Per pt "he is feeling good. This is the best 2 days that he has had in a while." Dr. Marin Olp made aware. Pt has been set-up for admission to [facility] on Wed 2/25 for 5 days for his chemo regimen. Wife verbalized understanding.

## 2013-04-14 ENCOUNTER — Other Ambulatory Visit: Payer: Self-pay | Admitting: Hematology & Oncology

## 2013-04-14 ENCOUNTER — Inpatient Hospital Stay (HOSPITAL_COMMUNITY)
Admission: AD | Admit: 2013-04-14 | Discharge: 2013-04-20 | DRG: 847 | Disposition: A | Discharge status: Home / Self Care | Payer: Medicare Other | Source: Ambulatory Visit | Attending: Hematology & Oncology | Admitting: Hematology & Oncology

## 2013-04-14 ENCOUNTER — Encounter (HOSPITAL_COMMUNITY): Payer: Self-pay

## 2013-04-14 DIAGNOSIS — C799 Secondary malignant neoplasm of unspecified site: Secondary | ICD-10-CM

## 2013-04-14 DIAGNOSIS — Z79899 Other long term (current) drug therapy: Secondary | ICD-10-CM

## 2013-04-14 DIAGNOSIS — E876 Hypokalemia: Secondary | ICD-10-CM | POA: Diagnosis not present

## 2013-04-14 DIAGNOSIS — D61818 Other pancytopenia: Secondary | ICD-10-CM

## 2013-04-14 DIAGNOSIS — E119 Type 2 diabetes mellitus without complications: Secondary | ICD-10-CM | POA: Diagnosis present

## 2013-04-14 DIAGNOSIS — G839 Paralytic syndrome, unspecified: Secondary | ICD-10-CM | POA: Diagnosis present

## 2013-04-14 DIAGNOSIS — I959 Hypotension, unspecified: Secondary | ICD-10-CM

## 2013-04-14 DIAGNOSIS — Z9221 Personal history of antineoplastic chemotherapy: Secondary | ICD-10-CM

## 2013-04-14 DIAGNOSIS — D638 Anemia in other chronic diseases classified elsewhere: Secondary | ICD-10-CM

## 2013-04-14 DIAGNOSIS — C8590 Non-Hodgkin lymphoma, unspecified, unspecified site: Secondary | ICD-10-CM

## 2013-04-14 DIAGNOSIS — M216X9 Other acquired deformities of unspecified foot: Secondary | ICD-10-CM | POA: Diagnosis present

## 2013-04-14 DIAGNOSIS — R7401 Elevation of levels of liver transaminase levels: Secondary | ICD-10-CM

## 2013-04-14 DIAGNOSIS — Z794 Long term (current) use of insulin: Secondary | ICD-10-CM

## 2013-04-14 DIAGNOSIS — C8589 Other specified types of non-Hodgkin lymphoma, extranodal and solid organ sites: Secondary | ICD-10-CM

## 2013-04-14 DIAGNOSIS — Z5111 Encounter for antineoplastic chemotherapy: Principal | ICD-10-CM

## 2013-04-14 DIAGNOSIS — M7989 Other specified soft tissue disorders: Secondary | ICD-10-CM | POA: Diagnosis not present

## 2013-04-14 DIAGNOSIS — R740 Nonspecific elevation of levels of transaminase and lactic acid dehydrogenase [LDH]: Secondary | ICD-10-CM

## 2013-04-14 DIAGNOSIS — R197 Diarrhea, unspecified: Secondary | ICD-10-CM | POA: Diagnosis not present

## 2013-04-14 DIAGNOSIS — R339 Retention of urine, unspecified: Secondary | ICD-10-CM | POA: Diagnosis present

## 2013-04-14 DIAGNOSIS — IMO0002 Reserved for concepts with insufficient information to code with codable children: Secondary | ICD-10-CM

## 2013-04-14 LAB — URINALYSIS, DIPSTICK ONLY
Bilirubin Urine: NEGATIVE
Glucose, UA: 100 mg/dL — AB
Hgb urine dipstick: NEGATIVE
KETONES UR: NEGATIVE mg/dL
LEUKOCYTES UA: NEGATIVE
NITRITE: NEGATIVE
Protein, ur: NEGATIVE mg/dL
SPECIFIC GRAVITY, URINE: 1.018 (ref 1.005–1.030)
Urobilinogen, UA: 0.2 mg/dL (ref 0.0–1.0)
pH: 6.5 (ref 5.0–8.0)

## 2013-04-14 LAB — COMPREHENSIVE METABOLIC PANEL
ALBUMIN: 2.9 g/dL — AB (ref 3.5–5.2)
ALT: 23 U/L (ref 0–53)
AST: 19 U/L (ref 0–37)
Alkaline Phosphatase: 82 U/L (ref 39–117)
BUN: 20 mg/dL (ref 6–23)
CALCIUM: 8.6 mg/dL (ref 8.4–10.5)
CO2: 26 mEq/L (ref 19–32)
CREATININE: 0.76 mg/dL (ref 0.50–1.35)
Chloride: 99 mEq/L (ref 96–112)
GFR calc Af Amer: >90 mL/min (ref >90)
GFR calc non Af Amer: >90 mL/min (ref >90)
Glucose, Bld: 152 mg/dL — ABNORMAL HIGH (ref 70–99)
POTASSIUM: 3 meq/L — AB (ref 3.7–5.3)
SODIUM: 140 meq/L (ref 137–147)
TOTAL PROTEIN: 5.5 g/dL — AB (ref 6.0–8.3)
Total Bilirubin: 0.4 mg/dL (ref 0.3–1.2)

## 2013-04-14 LAB — PHOSPHORUS: Phosphorus: 3.6 mg/dL (ref 2.3–4.6)

## 2013-04-14 LAB — CBC
HCT: 28.4 % — ABNORMAL LOW (ref 39.0–52.0)
HEMOGLOBIN: 10 g/dL — AB (ref 13.0–17.0)
MCH: 32.1 pg (ref 26.0–34.0)
MCHC: 35.2 g/dL (ref 30.0–36.0)
MCV: 91 fL (ref 78.0–100.0)
Platelets: 167 10*3/uL (ref 150–400)
RBC: 3.12 MIL/uL — AB (ref 4.22–5.81)
RDW: 18.2 % — ABNORMAL HIGH (ref 11.5–15.5)
WBC: 6.1 10*3/uL (ref 4.0–10.5)

## 2013-04-14 LAB — URINALYSIS, ROUTINE W REFLEX MICROSCOPIC
BILIRUBIN URINE: NEGATIVE
Glucose, UA: NEGATIVE mg/dL
Hgb urine dipstick: NEGATIVE
Ketones, ur: NEGATIVE mg/dL
Leukocytes, UA: NEGATIVE
Nitrite: NEGATIVE
Protein, ur: NEGATIVE mg/dL
SPECIFIC GRAVITY, URINE: 1.012 (ref 1.005–1.030)
UROBILINOGEN UA: 0.2 mg/dL (ref 0.0–1.0)
pH: 7 (ref 5.0–8.0)

## 2013-04-14 LAB — MAGNESIUM: MAGNESIUM: 1.9 mg/dL (ref 1.5–2.5)

## 2013-04-14 MED ORDER — ONDANSETRON HCL 8 MG PO TABS: 8.0000 mg | ORAL_TABLET | Freq: Two times a day (BID) | ORAL | Status: DC | PRN

## 2013-04-14 MED ORDER — ACETAMINOPHEN 325 MG PO TABS
650.0000 mg | ORAL_TABLET | Freq: Once | ORAL | Status: AC
  Administered 2013-04-14: 650 mg via ORAL

## 2013-04-14 MED ORDER — SODIUM CHLORIDE 0.9 % IJ SOLN
10.0000 mL | INTRAMUSCULAR | Status: DC | PRN
Start: 2013-04-14 — End: 2013-04-20

## 2013-04-14 MED ORDER — SODIUM BICARBONATE 650 MG PO TABS
1300.0000 mg | ORAL_TABLET | Freq: Three times a day (TID) | ORAL | Status: DC
  Administered 2013-04-14: 1300 mg via ORAL
  Filled 2013-04-14 (×4): qty 2

## 2013-04-14 MED ORDER — PROCHLORPERAZINE MALEATE 10 MG PO TABS: 10.0000 mg | ORAL_TABLET | Freq: Four times a day (QID) | ORAL | Status: DC | PRN

## 2013-04-14 MED ORDER — SODIUM CHLORIDE 0.9 % IJ SOLN: 3.0000 mL | INTRAMUSCULAR | Status: DC | PRN

## 2013-04-14 MED ORDER — CHLORHEXIDINE GLUCONATE 0.12 % MT SOLN
15.0000 mL | Freq: Four times a day (QID) | OROMUCOSAL | Status: DC
  Administered 2013-04-14 – 2013-04-19 (×16): 15 mL via OROMUCOSAL
  Filled 2013-04-14 (×25): qty 15

## 2013-04-14 MED ORDER — ENOXAPARIN SODIUM 40 MG/0.4ML ~~LOC~~ SOLN
40.0000 mg | SUBCUTANEOUS | Status: DC
  Administered 2013-04-14 – 2013-04-19 (×6): 40 mg via SUBCUTANEOUS
  Filled 2013-04-14 (×7): qty 0.4

## 2013-04-14 MED ORDER — SODIUM CHLORIDE 0.9 % IV SOLN: 250.0000 mL | Freq: Once | INTRAVENOUS | Status: AC

## 2013-04-14 MED ORDER — DOCUSATE SODIUM 100 MG PO CAPS
100.0000 mg | ORAL_CAPSULE | Freq: Every day | ORAL | Status: DC
  Administered 2013-04-15 – 2013-04-16 (×2): 100 mg via ORAL
  Filled 2013-04-14 (×3): qty 1

## 2013-04-14 MED ORDER — THIOTEPA CHEMO INJECTION 15 MG
35.0000 mg/m2 | Freq: Once | INTRAMUSCULAR | Status: AC
  Administered 2013-04-14: 66.56 mg via INTRAVENOUS
  Filled 2013-04-14: qty 6.4

## 2013-04-14 MED ORDER — SODIUM CHLORIDE 0.9 % IJ SOLN: 10.0000 mL | INTRAMUSCULAR | Status: DC | PRN

## 2013-04-14 MED ORDER — LEVOFLOXACIN 500 MG PO TABS
500.0000 mg | ORAL_TABLET | Freq: Every day | ORAL | Status: DC
  Administered 2013-04-15 – 2013-04-19 (×5): 500 mg via ORAL
  Filled 2013-04-14 (×6): qty 1

## 2013-04-14 MED ORDER — STERILE WATER FOR INJECTION IV SOLN
INTRAVENOUS | Status: DC
  Administered 2013-04-14 – 2013-04-20 (×10): via INTRAVENOUS
  Filled 2013-04-14 (×23): qty 9.7

## 2013-04-14 MED ORDER — LORAZEPAM 0.5 MG PO TABS: 0.5000 mg | ORAL_TABLET | Freq: Four times a day (QID) | ORAL | Status: DC | PRN

## 2013-04-14 MED ORDER — SODIUM BICARBONATE/SODIUM CHLORIDE MOUTHWASH
OROMUCOSAL | Status: DC
  Administered 2013-04-14 – 2013-04-20 (×30): via OROMUCOSAL
  Filled 2013-04-14: qty 1000

## 2013-04-14 MED ORDER — METOPROLOL TARTRATE 25 MG PO TABS
25.0000 mg | ORAL_TABLET | Freq: Two times a day (BID) | ORAL | Status: DC
  Administered 2013-04-14 – 2013-04-19 (×11): 25 mg via ORAL
  Filled 2013-04-14 (×13): qty 1

## 2013-04-14 MED ORDER — ARTIFICIAL TEARS OP OINT
TOPICAL_OINTMENT | Freq: Three times a day (TID) | OPHTHALMIC | Status: DC
  Administered 2013-04-14 – 2013-04-20 (×17): via OPHTHALMIC
  Filled 2013-04-14 (×2): qty 3.5

## 2013-04-14 MED ORDER — ENSURE COMPLETE PO LIQD
237.0000 mL | Freq: Two times a day (BID) | ORAL | Status: DC
  Administered 2013-04-15 (×2): 237 mL via ORAL

## 2013-04-14 MED ORDER — OXYCODONE HCL 5 MG PO TABS
10.0000 mg | ORAL_TABLET | ORAL | Status: DC | PRN
Start: 2013-04-14 — End: 2013-04-20
  Administered 2013-04-15 – 2013-04-19 (×5): 10 mg via ORAL
  Filled 2013-04-14 (×5): qty 2

## 2013-04-14 MED ORDER — SODIUM CHLORIDE 0.9 % IV SOLN
500.0000 mg/m2 | Freq: Once | INTRAVENOUS | Status: AC
  Administered 2013-04-14: 1000 mg via INTRAVENOUS
  Filled 2013-04-14: qty 100

## 2013-04-14 MED ORDER — SODIUM CHLORIDE 0.9 % IJ SOLN
INTRAMUSCULAR | Status: AC
  Filled 2013-04-14: qty 20

## 2013-04-14 MED ORDER — SODIUM CHLORIDE 0.9 % IV SOLN
Freq: Once | INTRAVENOUS | Status: AC
  Administered 2013-04-14: 18 mg via INTRAVENOUS
  Filled 2013-04-14: qty 4

## 2013-04-14 MED ORDER — HEPARIN SOD (PORK) LOCK FLUSH 100 UNIT/ML IV SOLN: 250.0000 [IU] | INTRAVENOUS | Status: DC | PRN

## 2013-04-14 MED ORDER — SODIUM CHLORIDE 0.9 % IV SOLN
INTRAVENOUS | Status: DC
  Administered 2013-04-14 – 2013-04-15 (×2): via INTRAVENOUS
  Administered 2013-04-17: 20 mL/h via INTRAVENOUS

## 2013-04-14 MED ORDER — HEPARIN SOD (PORK) LOCK FLUSH 100 UNIT/ML IV SOLN: 500.0000 [IU] | Freq: Once | INTRAVENOUS | Status: AC | PRN

## 2013-04-14 MED ORDER — INSULIN GLARGINE 100 UNIT/ML ~~LOC~~ SOLN
25.0000 [IU] | Freq: Every day | SUBCUTANEOUS | Status: DC
Start: 2013-04-14 — End: 2013-04-20
  Administered 2013-04-14 – 2013-04-19 (×6): 25 [IU] via SUBCUTANEOUS
  Filled 2013-04-14 (×6): qty 0.25

## 2013-04-14 MED ORDER — DIPHENHYDRAMINE HCL 50 MG/ML IJ SOLN
25.0000 mg | Freq: Once | INTRAMUSCULAR | Status: AC
  Administered 2013-04-14: 25 mg via INTRAVENOUS
  Filled 2013-04-14: qty 1

## 2013-04-14 MED ORDER — ALTEPLASE 2 MG IJ SOLR
2.0000 mg | Freq: Once | INTRAMUSCULAR | Status: AC | PRN
  Filled 2013-04-14: qty 2

## 2013-04-14 MED ORDER — KETOCONAZOLE 200 MG PO TABS: 200.0000 mg | ORAL_TABLET | Freq: Every day | ORAL | Status: DC

## 2013-04-14 MED ORDER — SODIUM CHLORIDE 0.9 % IV SOLN: INTRAVENOUS | Status: DC

## 2013-04-14 MED ORDER — VINCRISTINE SULFATE CHEMO INJECTION 1 MG/ML
2.0000 mg | Freq: Once | INTRAVENOUS | Status: AC
  Administered 2013-04-14: 2 mg via INTRAVENOUS
  Filled 2013-04-14: qty 2

## 2013-04-14 MED ORDER — ACETAMINOPHEN 325 MG PO TABS
650.0000 mg | ORAL_TABLET | Freq: Once | ORAL | Status: AC
  Administered 2013-04-14: 650 mg via ORAL
  Filled 2013-04-14: qty 2

## 2013-04-14 MED ORDER — HEPARIN SOD (PORK) LOCK FLUSH 100 UNIT/ML IV SOLN: 500.0000 [IU] | Freq: Every day | INTRAVENOUS | Status: DC | PRN

## 2013-04-14 MED ORDER — SODIUM CHLORIDE 0.9 % IV SOLN
250.0000 mL | Freq: Once | INTRAVENOUS | Status: AC
  Administered 2013-04-14: 250 mL via INTRAVENOUS

## 2013-04-14 MED ORDER — FAMCICLOVIR 500 MG PO TABS
250.0000 mg | ORAL_TABLET | Freq: Every day | ORAL | Status: DC
  Administered 2013-04-15 – 2013-04-19 (×5): 250 mg via ORAL
  Filled 2013-04-14 (×6): qty 0.5

## 2013-04-14 MED ORDER — INSULIN ASPART 100 UNIT/ML ~~LOC~~ SOLN
6.0000 [IU] | Freq: Three times a day (TID) | SUBCUTANEOUS | Status: DC
  Administered 2013-04-15 – 2013-04-19 (×11): 6 [IU] via SUBCUTANEOUS

## 2013-04-14 MED ORDER — FENTANYL 12 MCG/HR TD PT72
12.5000 ug | MEDICATED_PATCH | TRANSDERMAL | Status: DC
  Administered 2013-04-16 – 2013-04-19 (×2): 12.5 ug via TRANSDERMAL
  Filled 2013-04-14 (×2): qty 1

## 2013-04-14 MED ORDER — HEPARIN SOD (PORK) LOCK FLUSH 100 UNIT/ML IV SOLN: 250.0000 [IU] | Freq: Once | INTRAVENOUS | Status: AC | PRN

## 2013-04-14 MED ORDER — TAMSULOSIN HCL 0.4 MG PO CAPS
0.4000 mg | ORAL_CAPSULE | Freq: Every day | ORAL | Status: DC
Start: 2013-04-15 — End: 2013-04-15
  Filled 2013-04-14: qty 1

## 2013-04-14 MED ORDER — ONDANSETRON HCL 4 MG/5ML PO SOLN
8.0000 mg | Freq: Three times a day (TID) | ORAL | Status: DC | PRN
  Administered 2013-04-14: 8 mg via ORAL
  Filled 2013-04-14: qty 10

## 2013-04-14 MED ORDER — FLUCONAZOLE 200 MG PO TABS
200.0000 mg | ORAL_TABLET | Freq: Every day | ORAL | Status: DC
  Administered 2013-04-14 – 2013-04-19 (×6): 200 mg via ORAL
  Filled 2013-04-14 (×7): qty 1

## 2013-04-14 NOTE — Progress Notes (Signed)
D: Patient 30 mins into Rituxan infusion. A: Patient tolerating well, no complaints. Drowsy after benadryl. R: Increased rate to 200 mg/hr.

## 2013-04-14 NOTE — Care Management Note (Signed)
   CARE MANAGEMENT NOTE 04/14/2013  Patient:  [facility]   Account Number:  [id number]  Date Initiated:  04/14/2013  Documentation initiated by:  Brendolyn Stockley  Subjective/Objective Assessment:   68 yo male patient of Dr.Peter Ennever admitted for chemo regimen.     Action/Plan:   Home when stable   Anticipated DC Date:     Anticipated DC Plan:  [place]  CM consult      Choice offered to / List presented to:  NA   DME arranged  NA      DME agency  NA     [place] arranged  NA      [place] agency  NA   Status of service:  Completed, signed off Medicare Important Message given?   (If response is "NO", the following Medicare IM given date fields will be blank) Date Medicare IM given:   Date Additional Medicare IM given:    Discharge Disposition:    Per UR Regulation:  Reviewed for med. necessity/level of care/duration of stay  If discussed at [place] of Stay Meetings, dates discussed:    Comments:  04/14/13 1250 Windle Huebert,MSN,RN [phone] Chart reviewed for utilization of services. No needs identified at this time.

## 2013-04-14 NOTE — Progress Notes (Signed)
INITIAL NUTRITION ASSESSMENT  DOCUMENTATION CODES Per approved criteria  -Severe malnutrition in the context of chronic illness  Pt meets criteria for severe  MALNUTRITION in the context of chronic illness as evidenced by PO intake <75% for >one month,  7.7% weight loss in past 3 months    INTERVENTION: -Recommend chocolate Ensure Complete po BID, each supplement provides 350 kcal and 13 grams of protein -Will add 10AM, 2PM and 8PM snacks -Continue with liberalized diet to encourage PO intake -Will continue to monitor  NUTRITION DIAGNOSIS: Inadequate oral intake related to decreased appetite as evidenced by PO intake <75%, hx of wt loss  Goal: Pt to meet >/= 90% of their estimated nutrition needs   Monitor:  Total protein/energy intake, labs, weights  Reason for Assessment: MST  68 y.o. male  Admitting Dx: <principal problem not specified>  ASSESSMENT: N[place] is an 68 y.o. male with a history of Diffuse large cell non-Hodgkin lymphoma (IPI score equal 5).The patient is status post 7 cycles of R-CHOP as well as Palliative radiation therapy to the left hip. He is on Neulasta for chemotherapy-induced neutropenia  -Pt reported overall weakness and decreased appetite since beginning of chemotherapy treatments. Has been evaluated by RD during previous admit in 1/26 with PO intake ranging from 50-75% -Usual body weight was 210 lbs before cancer dx. Appears to have lost 13 lbs over past 3 months per previous medical records and ongoing weight loss. -Pt tries to eat 3 meals/day but has been eating much smaller amounts, approx 50% of meals. Drinks Ensure once daily. Does not like Glucerna shake supplement -Enjoys snacks, ice cream and peanut butter crackers. Will add for pt to receive snacks TID. Pt open to try MagicCup protein supplement -MD expressed concern with elevated blood glucose, pt with hx of DM2. However, d/t pt's decreased PO intake, pt would benefit from continuing on  liberalized diet to encouraged appetite. Will continue to monitor CBGS  Height: Ht Readings from Last 1 Encounters:  04/14/13 5\' 9"  (1.753 m)    Weight: Wt Readings from Last 1 Encounters:  04/14/13 155 lb (70.308 kg)    Ideal Body Weight: 160 lbs  % Ideal Body Weight: 97%  Wt Readings from Last 10 Encounters:  04/14/13 155 lb (70.308 kg)  04/07/13 155 lb (70.308 kg)  03/30/13 154 lb (69.854 kg)  03/18/13 161 lb 13.1 oz (73.4 kg)  03/18/13 161 lb 13.1 oz (73.4 kg)  03/09/13 161 lb (73.029 kg)  03/05/13 168 lb (76.204 kg)  02/08/13 168 lb (76.204 kg)  01/18/13 173 lb (78.472 kg)  11/27/12 173 lb 15.1 oz (78.9 kg)    Usual Body Weight: 210 lbs  % Usual Body Weight: 74%  BMI:  Body mass index is 22.88 kg/(m^2).  Estimated Nutritional Needs: Kcal: 2671-2458 Protein: 90-110 gram Fluid: 2300 ml/daily  Skin: WDL  Diet Order: General  EDUCATION NEEDS: -No education needs identified at this time  No intake or output data in the 24 hours ending 04/14/13 1433  Last BM: none during admit   Labs:   Recent Labs Lab 04/09/13 1029  NA 136  K 3.2*  CL 101  CO2 28  BUN 11  CREATININE 0.5*  CALCIUM 8.6  GLUCOSE 126*    CBG (last 3)  No results found for this basename: GLUCAP,  in the last 72 hours  Scheduled Meds: . sodium chloride  250 mL Intravenous Once  . sodium chloride  250 mL Intravenous Once  . acetaminophen  650 mg  Oral Once  . acetaminophen  650 mg Oral Once  . diphenhydrAMINE  25 mg Intravenous Once  . enoxaparin (LOVENOX) injection  40 mg Subcutaneous Q24H  . ondansetron (ZOFRAN) with dexamethasone (DECADRON) IV   Intravenous Once  . rituximab  500 mg/m2 (Treatment Plan Actual) Intravenous Once  . sodium chloride      . thiotepa CHEMO IV infusion  35 mg/m2 (Treatment Plan Actual) Intravenous Once  . vinCRIStine (ONCOVIN) CHEMO IV infusion  2 mg Intravenous Once    Continuous Infusions: . sodium chloride    .  sodium bicarbonate infusion  1/4 NS 1000 mL      Past Medical History  Diagnosis Date  . Back pain   . Hypertension   . Diabetes mellitus without complication   . Cancer     non hodkins lymphoma    Past Surgical History  Procedure Laterality Date  . Chemo reservior insertion N/A 03/15/2013    Procedure: CHEMO RESERVOIR INSERTION;  Surgeon: Ophelia Charter, MD;  Location: [place] [facility];  Service: Neurosurgery;  Laterality: N/A;  right    [place] Clinical Dietitian [phone]

## 2013-04-14 NOTE — Progress Notes (Signed)
D: Immediately after initiating the Rituxan at 100 mg/hr., pt became nauseated and vomited stomach contents. A: Order for zofran po received, given to patient. Rituxan interrupted for one hour. R: Rituxan re-initiated at 5 p.m, pt. Resting comfortably.

## 2013-04-14 NOTE — H&P (Signed)
#   [id number] is admit note.  Keith E.  Romans 8:28

## 2013-04-14 NOTE — Progress Notes (Signed)
D. Rituxan ordered per protocol 500 mg/m2. A Confirmed dosing with Dr. Marin Olp. R. Ready to initiate Rituxan

## 2013-04-15 LAB — URINALYSIS, DIPSTICK ONLY
Bilirubin Urine: NEGATIVE
Bilirubin Urine: NEGATIVE
GLUCOSE, UA: 500 mg/dL — AB
Glucose, UA: 250 mg/dL — AB
HGB URINE DIPSTICK: NEGATIVE
Hgb urine dipstick: NEGATIVE
KETONES UR: NEGATIVE mg/dL
Ketones, ur: NEGATIVE mg/dL
LEUKOCYTES UA: NEGATIVE
Leukocytes, UA: NEGATIVE
NITRITE: NEGATIVE
Nitrite: NEGATIVE
PH: 8 (ref 5.0–8.0)
PH: 7.5 (ref 5.0–8.0)
Protein, ur: NEGATIVE mg/dL
Protein, ur: 30 mg/dL — AB
SPECIFIC GRAVITY, URINE: 1.008 (ref 1.005–1.030)
Specific Gravity, Urine: 1.012 (ref 1.005–1.030)
Urobilinogen, UA: 0.2 mg/dL (ref 0.0–1.0)
Urobilinogen, UA: 0.2 mg/dL (ref 0.0–1.0)

## 2013-04-15 LAB — GLUCOSE, CAPILLARY
GLUCOSE-CAPILLARY: 200 mg/dL — AB (ref 70–99)
GLUCOSE-CAPILLARY: 185 mg/dL — AB (ref 70–99)
Glucose-Capillary: 203 mg/dL — ABNORMAL HIGH (ref 70–99)
Glucose-Capillary: 247 mg/dL — ABNORMAL HIGH (ref 70–99)
Glucose-Capillary: 220 mg/dL — ABNORMAL HIGH (ref 70–99)

## 2013-04-15 LAB — BASIC METABOLIC PANEL
BUN: 15 mg/dL (ref 6–23)
CHLORIDE: 95 meq/L — AB (ref 96–112)
CO2: 29 meq/L (ref 19–32)
Calcium: 8.5 mg/dL (ref 8.4–10.5)
Creatinine, Ser: 0.73 mg/dL (ref 0.50–1.35)
GFR calc Af Amer: >90 mL/min (ref >90)
GFR calc non Af Amer: >90 mL/min (ref >90)
GLUCOSE: 176 mg/dL — AB (ref 70–99)
Potassium: 3.5 mEq/L — ABNORMAL LOW (ref 3.7–5.3)
SODIUM: 137 meq/L (ref 137–147)

## 2013-04-15 MED ORDER — SODIUM CHLORIDE 0.9 % IV SOLN
5.5200 g/m2 | Freq: Once | INTRAVENOUS | Status: AC
  Administered 2013-04-15: 10.488 g via INTRAVENOUS
  Filled 2013-04-15: qty 419.52

## 2013-04-15 MED ORDER — HOT PACK MISC ONCOLOGY
1.0000 | Freq: Once | Status: AC | PRN
  Filled 2013-04-15: qty 1

## 2013-04-15 MED ORDER — LEUCOVORIN CALCIUM INJECTION 100 MG: 15.0000 mg/m2 | Freq: Four times a day (QID) | INTRAMUSCULAR | Status: DC

## 2013-04-15 MED ORDER — ONDANSETRON HCL 4 MG/2ML IJ SOLN
INTRAMUSCULAR | Status: AC
  Administered 2013-04-15 – 2013-04-16 (×2): 16 mg via INTRAVENOUS
  Filled 2013-04-15 (×2): qty 8

## 2013-04-15 MED ORDER — SODIUM BICARBONATE 650 MG PO TABS
650.0000 mg | ORAL_TABLET | Freq: Three times a day (TID) | ORAL | Status: DC
  Administered 2013-04-15 – 2013-04-16 (×6): 650 mg via ORAL
  Filled 2013-04-15 (×9): qty 1

## 2013-04-15 MED ORDER — TAMSULOSIN HCL 0.4 MG PO CAPS
0.8000 mg | ORAL_CAPSULE | Freq: Every day | ORAL | Status: DC
  Administered 2013-04-15 – 2013-04-19 (×5): 0.8 mg via ORAL
  Filled 2013-04-15 (×6): qty 2

## 2013-04-15 MED ORDER — LEUCOVORIN CALCIUM INJECTION 100 MG
15.0000 mg/m2 | INTRAMUSCULAR | Status: DC
  Administered 2013-04-17 (×3): 28 mg via INTRAVENOUS
  Filled 2013-04-15 (×5): qty 1.4

## 2013-04-15 MED ORDER — SODIUM CHLORIDE 0.9 % IV SOLN
1.2000 g/m2 | Freq: Once | INTRAVENOUS | Status: AC
  Administered 2013-04-15: 2.28 g via INTRAVENOUS
  Filled 2013-04-15: qty 91.2

## 2013-04-15 NOTE — Progress Notes (Signed)
Patient finished Retuxan around 2220 pm last night.  He was then given premeds before getting Vincristine and Thiotepa.  Patient tolerated chemotherapy well. Good blood return from right chest port.  Peripheral IV running continuous IVF.  No complaints. Will continue to monitor patient.Keith Duffy

## 2013-04-15 NOTE — H&P (Signed)
NAME:  Keith Duffy, Keith Duffy NO.:  [id number]  MEDICAL RECORD NO.:  [id number]  LOCATION:  36                         FACILITY:  [facility]  PHYSICIAN:  Volanda Napoleon, M.D.  DATE OF BIRTH:  02/07/1946  DATE OF ADMISSION:  04/14/2013 DATE OF DISCHARGE:                             HISTORY & PHYSICAL   REASON FOR ADMISSION: 1. Cycle #2 of chemotherapy with R-MVP. 2. 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 3. Right Cranial Nerve 7 Palsy. 4. Left foot drop. 5. Insulin-dependent diabetes.  HISTORY OF PRESENT ILLNESS:  Mr. Lindblad is a very nice 68 year old gentleman.  He presented with advanced diffuse large cell lymphoma back in July 2014.  He was treated with chemotherapy.  He got R-CHOP.  He had 8 cycles.  He got into remission.  He then began to develop neurological symptoms early this year.  He had a right cranial nerve 7 palsy.  He had a foot drop.  Unfortunately, he was found to have CNS involvement by non-Hodgkin lymphoma.  He had his first cycle of chemotherapy with Rituxan- methotrexate/thiotepa/vincristine.  He had this in late January.  He had an Ommaya reservoir placed.  He has had intrathecal DepoCyt through the Ommaya.  He had a hard time with the first cycle of chemotherapy.  He was incredibly pancytopenic and required transfusions.  He was dehydrated. He lost some weight.  He really just felt horrible.  He began to feel better.  We did a 2nd instillation of DepoCyt into the Ommaya reservoir on February 18th.  Unfortunately, the fluid was still positive for lymphoma.  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  We have got him and we put him on some Decadron.  This helped quite a bit.  He now is being admitted for cycle #2 of high-dose methotrexate based chemotherapy.  He feels a whole lot better.  He did have some vomiting this morning.  This may be anticipatory vomiting.  His right cranial nerve 7  dysfunction may be a little bit better.  He has some discomfort in the buttock.  This may be from weight loss and pressure on the ischial tuberosity.  He had a little bit of urinary retention last night.  I am unsure as to why he would have this.  He had it before, but this improved.  We did get him on some Flomax.  He stopped this as his urination was doing better.  He has had no fever.  He has had no bleeding.  He has had no cough.  His appetite is a little bit better.  PAST MEDICAL HISTORY: 1. Remarkable for insulin-dependent diabetes. 2. Intermittent anemia.  ALLERGIES:  Codeine.  MEDICATIONS: 1. Famvir 250 mg p.o. daily. 2. Fentanyl patch 12.5 mcg to skin every 3 days. 3. Lantus insulin 25 units at bedtime. 4. Ketoconazole 200 mg p.o. daily. 5. Levaquin 500 mg p.o. daily. 6. Ativan 0.5 mg q.6 hours p.r.n. 7. Mobic 15 mg p.o. q.a.m. 8. Metoprolol 25 mg p.o. b.i.d. 9. Oxycodone 10-20 mg p.o. q.4 hours p.r.n. 10.Compazine 10 mg p.o. q.6 hours p.r.n. 11.Flomax 0.4 mg p.o. daily.  PHYSICAL EXAMINATION:  GENERAL:  Slightly  thin white gentleman, in no obvious distress.  He is alert and oriented x3. VITAL SIGNS:  Temperature 97.5, pulse is 53, blood pressure 130/63. HEAD AND NECK:  Shows no ocular or oral lesions.  He has no palpable cervical or supraclavicular lymph nodes. LUNGS:  Clear bilaterally. CARDIAC:  Regular rate and rhythm with normal S1 and S2.  There are no murmurs, rubs, or bruits. ABDOMEN:  Soft.  He has good bowel sounds.  There is no fluid wave. There is no palpable hepatosplenomegaly. BACK:  No tenderness over the spine, ribs, or hips. EXTREMITIES:  Show the left foot drop.  He has some muscle atrophy in his legs.  There is some swelling about the left ankle.  NEUROLOGICAL: Shows the right cranial nerve 7 palsy.  He has left foot drop.  LABORATORY STUDIES:  White cell count 6.1, hemoglobin 10, hematocrit 28, platelet count 167.  Sodium 140, potassium 3,  BUN 20, creatinine is 0.976.  Albumin 2.9.  IMPRESSION AND PLAN:  Mr. Steinhoff is a 68 year old gentleman.  He has CNS relapse of non-Hodgkin's lymphoma.  We will try a second cycle of chemotherapy with high-dose methotrexate. I will dose reduce his chemotherapy a little bit just because of his past difficulties.  We will have to make sure his urine has alkalinized.  Pharmacy is helping [place] with this.  He has sodium bicarb in his IV fluids.  We may give him some oral sodium bicarb to try to help get urine alkalinized, so we can do methotrexate tomorrow.  We will get him on his antibiotic prophylactic agents.  We will have to monitor his blood sugars.  We will continue him on IV hydration.  Hopefully, we will be able to start methotrexate tomorrow.  Again, a lot will depend on his urine pH.  We will again, monitor his blood sugars.     Volanda Napoleon, M.D.     PRE/MEDQ  D:  04/14/2013  T:  04/15/2013  Job:  [id number]

## 2013-04-15 NOTE — Progress Notes (Signed)
Inpatient Diabetes Program Recommendations  AACE/ADA: New Consensus Statement on Inpatient Glycemic Control (2013)  Target Ranges:  Prepandial:   less than 140 mg/dL      Peak postprandial:   less than 180 mg/dL (1-2 hours)      Critically ill patients:  140 - 180 mg/dL   Reason for Visit: Hyperglycemia  Diabetes history: Type 2 Outpatient Diabetes medications: Lantus 25 units QHS, Novolog sensitive s/s sensitive Current orders for Inpatient glycemic control: Lantus 25 units QHS and Novolog 5 units tidwc  Inpatient Diabetes Program Recommendations Correction (SSI): Add Novolog sensitive tidwc  Note: Will follow. Thank you. Lorenda Peck, RD, LDN, CDE Inpatient Diabetes Coordinator [phone]

## 2013-04-15 NOTE — Progress Notes (Signed)
Mr. Lincks is doing well. She no problems with Rituxan and ThioTEPA and vincristine. His urine pH is 8. He'll start methotrexate today.  There is no nausea vomiting. He has no headache. I think the Decadron his help in this. His blood sugars were high. He is on insulin.  Is no diarrhea. His low constipated. There's still some urinary issues. I will increase his Flomax.  Is no cough or shortness of breath. His neurological changes are about the same.  He does have some pain in his buttock. I think this is from muscle loss and pressure on his ischial tuberosities.   His blood pressure is 185/79. A lot of this is from his fluids and steroids. He is afebrile. Pulse is 71. Oral exam is without mucositis. Lungs are clear. Cardiac exam regular rate and rhythm. Abdomen is soft. There is no palpable liver or spleen. Extremities shows some edema in his left ankle. He has some weakness in the left foot from his foot drop. Skin shows no rashes.   We will go ahead with his methotrexate today. We will watch his blood pressure.  Appreciate a great care he is getting from the staff on 3 E.!!  [name]  Hebrews 12:12

## 2013-04-16 DIAGNOSIS — M7989 Other specified soft tissue disorders: Secondary | ICD-10-CM

## 2013-04-16 LAB — URINALYSIS, DIPSTICK ONLY
BILIRUBIN URINE: NEGATIVE
Bilirubin Urine: NEGATIVE
GLUCOSE, UA: NEGATIVE mg/dL
GLUCOSE, UA: NEGATIVE mg/dL
HGB URINE DIPSTICK: NEGATIVE
Hgb urine dipstick: NEGATIVE
Ketones, ur: NEGATIVE mg/dL
Ketones, ur: NEGATIVE mg/dL
Leukocytes, UA: NEGATIVE
Leukocytes, UA: NEGATIVE
Nitrite: NEGATIVE
Nitrite: NEGATIVE
PROTEIN: 30 mg/dL — AB
PROTEIN: 30 mg/dL — AB
Specific Gravity, Urine: 1.013 (ref 1.005–1.030)
Specific Gravity, Urine: 1.011 (ref 1.005–1.030)
UROBILINOGEN UA: 1 mg/dL (ref 0.0–1.0)
Urobilinogen, UA: 1 mg/dL (ref 0.0–1.0)
pH: 7.5 (ref 5.0–8.0)
pH: 8 (ref 5.0–8.0)

## 2013-04-16 LAB — GLUCOSE, CAPILLARY
GLUCOSE-CAPILLARY: 107 mg/dL — AB (ref 70–99)
Glucose-Capillary: 145 mg/dL — ABNORMAL HIGH (ref 70–99)
Glucose-Capillary: 173 mg/dL — ABNORMAL HIGH (ref 70–99)
Glucose-Capillary: 178 mg/dL — ABNORMAL HIGH (ref 70–99)

## 2013-04-16 LAB — CBC
HCT: 25.4 % — ABNORMAL LOW (ref 39.0–52.0)
Hemoglobin: 9 g/dL — ABNORMAL LOW (ref 13.0–17.0)
MCH: 31.5 pg (ref 26.0–34.0)
MCHC: 35.4 g/dL (ref 30.0–36.0)
MCV: 88.8 fL (ref 78.0–100.0)
Platelets: 153 10*3/uL (ref 150–400)
RBC: 2.86 MIL/uL — AB (ref 4.22–5.81)
RDW: 18.6 % — ABNORMAL HIGH (ref 11.5–15.5)
WBC: 5 10*3/uL (ref 4.0–10.5)

## 2013-04-16 LAB — COMPREHENSIVE METABOLIC PANEL
ALK PHOS: 76 U/L (ref 39–117)
ALT: 18 U/L (ref 0–53)
AST: 15 U/L (ref 0–37)
Albumin: 2.5 g/dL — ABNORMAL LOW (ref 3.5–5.2)
BUN: 18 mg/dL (ref 6–23)
CALCIUM: 8.2 mg/dL — AB (ref 8.4–10.5)
CHLORIDE: 96 meq/L (ref 96–112)
CO2: 29 meq/L (ref 19–32)
Creatinine, Ser: 0.8 mg/dL (ref 0.50–1.35)
GFR calc Af Amer: >90 mL/min (ref >90)
Glucose, Bld: 149 mg/dL — ABNORMAL HIGH (ref 70–99)
Potassium: 3.2 mEq/L — ABNORMAL LOW (ref 3.7–5.3)
SODIUM: 139 meq/L (ref 137–147)
Total Bilirubin: 0.8 mg/dL (ref 0.3–1.2)
Total Protein: 4.6 g/dL — ABNORMAL LOW (ref 6.0–8.3)

## 2013-04-16 LAB — LACTATE DEHYDROGENASE: LDH: 214 U/L (ref 94–250)

## 2013-04-16 MED ORDER — SODIUM CHLORIDE 0.9 % IV SOLN
Freq: Two times a day (BID) | INTRAVENOUS | Status: AC
  Administered 2013-04-16 – 2013-04-17 (×2): 10 mg via INTRAVENOUS
  Administered 2013-04-17: 16 mg via INTRAVENOUS
  Administered 2013-04-18: 10 mg via INTRAVENOUS
  Administered 2013-04-18 – 2013-04-19 (×2): 16 mg via INTRAVENOUS
  Filled 2013-04-16 (×7): qty 1

## 2013-04-16 MED ORDER — POTASSIUM CHLORIDE 10 MEQ/50ML IV SOLN
10.0000 meq | INTRAVENOUS | Status: AC
  Administered 2013-04-16 (×4): 10 meq via INTRAVENOUS
  Filled 2013-04-16 (×4): qty 50

## 2013-04-16 NOTE — Clinical Documentation Improvement (Signed)
INITIAL NUTRITION ASSESSMENT  04/14/13   Pt meets criteria for severe MALNUTRITION in the context of chronic illness as evidenced by PO intake <75% for >one month, 7.7% weight loss in past 3 months   Please clarify possible clinical conditions for this admission. Thank you.  Severe Malnutrition   Protein Calorie Malnutrition Severe Protein Calorie Malnutrition Emaciation  Cachexia    Other Condition Cannot clinically determine  Supporting Information:Inadequate oral intake related to decreased appetite as evidenced by PO intake <75%, hx of wt loss  Risk Factors:Diffuse large cell non-Hodgkin lymphoma, DM  Signs & Symptoms:weakness and decreased appetite since beginning of chemotherapy treatments  Diagnostics:Height:5\' 9"   Weight:155 lb Body mass index is 22.88   Treatment:INTERVENTION:  -Recommend chocolate Ensure Complete po BID, each supplement provides 350 kcal and 13 grams of protein  -Will add 10AM, 2PM and 8PM snacks  -Continue with liberalized diet to encourage PO intake  -Will continue to monitor Total protein/energy intake, labs, weights    Thank You, Philippa Chester ,RN Clinical Documentation Specialist:  [email or web address]

## 2013-04-16 NOTE — Progress Notes (Signed)
Keith Duffy is doing well with chemotherapy so far. The methotrexate infusion will finish up today. He'll then start leucovorin rescue 12 hours later. We will continue to be aggressive with getting his methotrexate level down..  There is no nausea vomiting. He does have some swelling in his feet. I don't think we have to do any kind of diuretic right now. He is a little ahead and fluid but again I don't think we have to give him any diuretic.  His potassium is down a little bit. I will replace this with IV potassium.  He is having improved pain. He slept well last night.  There is no bleeding. He's had no headache. There's no mouth sores. He's had no cough or shortness of breath.  His vital signs look good. Temperature 98.2. Blood pressure 131/64. Lungs are clear. Cardiac exam regular rate and rhythm. Abdomen is soft. There is no palpable hepatosplenomegaly. Head and neck exam shows the right cranial nerve 7 weakness. His heart is say this might be improved. There is no intraoral lesions. Extremities shows some swelling in his feet only. The swelling is noted in his lower legs. Has good strength. Good range of motion. Skin exam is negative for any ecchymosis or petechia. Neurological exam shows the cranial nerve 7 weakness on the right. He still has a left foot drop.  His labs show white cell count 5. Hemoglobin 9. Platelet count 153. His creatinine 0.8.  Again, his chemotherapy will finish later on this afternoon. He will start leucovorin 12 hours later. We will check a methotrexate level on Sunday.  I appreciate all the care that he is getting on the floor on 3 E.  [name]  Proverbs 3:5-6

## 2013-04-17 DIAGNOSIS — R7309 Other abnormal glucose: Secondary | ICD-10-CM

## 2013-04-17 DIAGNOSIS — R197 Diarrhea, unspecified: Secondary | ICD-10-CM

## 2013-04-17 LAB — GLUCOSE, CAPILLARY
GLUCOSE-CAPILLARY: 144 mg/dL — AB (ref 70–99)
Glucose-Capillary: 148 mg/dL — ABNORMAL HIGH (ref 70–99)
Glucose-Capillary: 194 mg/dL — ABNORMAL HIGH (ref 70–99)
Glucose-Capillary: 150 mg/dL — ABNORMAL HIGH (ref 70–99)

## 2013-04-17 LAB — URINALYSIS, DIPSTICK ONLY
BILIRUBIN URINE: NEGATIVE
BILIRUBIN URINE: NEGATIVE
BILIRUBIN URINE: NEGATIVE
Bilirubin Urine: NEGATIVE
GLUCOSE, UA: 100 mg/dL — AB
GLUCOSE, UA: NEGATIVE mg/dL
Glucose, UA: 100 mg/dL — AB
Glucose, UA: 250 mg/dL — AB
HGB URINE DIPSTICK: NEGATIVE
HGB URINE DIPSTICK: NEGATIVE
HGB URINE DIPSTICK: NEGATIVE
Hgb urine dipstick: NEGATIVE
KETONES UR: NEGATIVE mg/dL
Ketones, ur: NEGATIVE mg/dL
Ketones, ur: NEGATIVE mg/dL
Ketones, ur: NEGATIVE mg/dL
Leukocytes, UA: NEGATIVE
Leukocytes, UA: NEGATIVE
Leukocytes, UA: NEGATIVE
Leukocytes, UA: NEGATIVE
NITRITE: NEGATIVE
NITRITE: NEGATIVE
Nitrite: NEGATIVE
Nitrite: NEGATIVE
PH: 8.5 — AB (ref 5.0–8.0)
PH: 8 (ref 5.0–8.0)
PROTEIN: NEGATIVE mg/dL
Protein, ur: NEGATIVE mg/dL
Protein, ur: NEGATIVE mg/dL
Protein, ur: NEGATIVE mg/dL
SPECIFIC GRAVITY, URINE: 1.01 (ref 1.005–1.030)
SPECIFIC GRAVITY, URINE: 1.014 (ref 1.005–1.030)
SPECIFIC GRAVITY, URINE: 1.014 (ref 1.005–1.030)
Specific Gravity, Urine: 1.008 (ref 1.005–1.030)
UROBILINOGEN UA: 1 mg/dL (ref 0.0–1.0)
UROBILINOGEN UA: 1 mg/dL (ref 0.0–1.0)
Urobilinogen, UA: 0.2 mg/dL (ref 0.0–1.0)
Urobilinogen, UA: 0.2 mg/dL (ref 0.0–1.0)
pH: 8.5 — ABNORMAL HIGH (ref 5.0–8.0)
pH: 8.5 — ABNORMAL HIGH (ref 5.0–8.0)

## 2013-04-17 LAB — COMPREHENSIVE METABOLIC PANEL
ALBUMIN: 2.6 g/dL — AB (ref 3.5–5.2)
ALT: 18 U/L (ref 0–53)
AST: 17 U/L (ref 0–37)
Alkaline Phosphatase: 76 U/L (ref 39–117)
BUN: 20 mg/dL (ref 6–23)
CALCIUM: 8.3 mg/dL — AB (ref 8.4–10.5)
CO2: 32 mEq/L (ref 19–32)
Chloride: 99 mEq/L (ref 96–112)
Creatinine, Ser: 0.91 mg/dL (ref 0.50–1.35)
GFR calc Af Amer: >90 mL/min (ref >90)
GFR, EST NON AFRICAN AMERICAN: 87 mL/min — AB (ref >90)
Glucose, Bld: 113 mg/dL — ABNORMAL HIGH (ref 70–99)
Potassium: 3.3 mEq/L — ABNORMAL LOW (ref 3.7–5.3)
SODIUM: 142 meq/L (ref 137–147)
Total Bilirubin: 0.6 mg/dL (ref 0.3–1.2)
Total Protein: 4.9 g/dL — ABNORMAL LOW (ref 6.0–8.3)

## 2013-04-17 LAB — CBC
HCT: 27 % — ABNORMAL LOW (ref 39.0–52.0)
Hemoglobin: 9.4 g/dL — ABNORMAL LOW (ref 13.0–17.0)
MCH: 31.6 pg (ref 26.0–34.0)
MCHC: 34.8 g/dL (ref 30.0–36.0)
MCV: 90.9 fL (ref 78.0–100.0)
PLATELETS: 151 10*3/uL (ref 150–400)
RBC: 2.97 MIL/uL — ABNORMAL LOW (ref 4.22–5.81)
RDW: 19.3 % — ABNORMAL HIGH (ref 11.5–15.5)
WBC: 4.7 10*3/uL (ref 4.0–10.5)

## 2013-04-17 MED ORDER — POTASSIUM CHLORIDE 10 MEQ/50ML IV SOLN
10.0000 meq | INTRAVENOUS | Status: AC
  Administered 2013-04-17 (×4): 10 meq via INTRAVENOUS
  Filled 2013-04-17 (×4): qty 50

## 2013-04-17 MED ORDER — DOCUSATE SODIUM 100 MG PO CAPS
100.0000 mg | ORAL_CAPSULE | Freq: Two times a day (BID) | ORAL | Status: DC | PRN
  Filled 2013-04-17: qty 1

## 2013-04-17 MED ORDER — LEUCOVORIN CALCIUM INJECTION 100 MG
15.0000 mg/m2 | Freq: Four times a day (QID) | INTRAMUSCULAR | Status: DC
  Administered 2013-04-18: 28 mg via INTRAVENOUS
  Filled 2013-04-17 (×3): qty 1.4

## 2013-04-17 MED ORDER — LEUCOVORIN CALCIUM INJECTION 100 MG
15.0000 mg/m2 | INTRAMUSCULAR | Status: AC
  Administered 2013-04-17 – 2013-04-18 (×9): 28 mg via INTRAVENOUS
  Filled 2013-04-17 (×10): qty 1.4

## 2013-04-17 NOTE — Progress Notes (Signed)
Keith Duffy continues to do well. He completed methotrexate yesterday. He is a leucovorin rescue. We will check his methotrexate level tomorrow morning.  I want to do 12 doses of the every 3 hour leucovorin. With his first cycle of methotrexate, it took quite a while to get his methotrexate level down. As such, they would have to be a little more aggressive with his levels.  There is no nausea vomiting. He had some diarrhea. Will change his Colace.  He still urinating well. His urine pH is 8.5. I will stop his sodium bicarbonate tablets.  He's had no cough. There's no mouth sores.  His vital signs are all stable. Blood pressure 131/62.  His blood sugars are on the high side because of the Decadron. I am not too concerned with these.  His lungs are clear. Cardiac exam regular rate and rhythm. It looks like the right cranial nerve 7 palsy might be a little better. There is no oral lesion. Abdomen is soft. Has good bowel sounds. There is no fluid wave. Extremities shows the continued a foot drop with his left foot. There is still some swelling in his feet. Skin exam shows no rashes.  I will start him on Neupogen today.  His labs look pretty good. Potassium 3.3. I will see the replacements with IV potassium.  Once his methotrexate level is close to 0.05, that we can discharge him.  I see him in the office on Tuesday.

## 2013-04-18 DIAGNOSIS — E119 Type 2 diabetes mellitus without complications: Secondary | ICD-10-CM

## 2013-04-18 LAB — URINALYSIS, DIPSTICK ONLY
BILIRUBIN URINE: NEGATIVE
Bilirubin Urine: NEGATIVE
Glucose, UA: 100 mg/dL — AB
Glucose, UA: 100 mg/dL — AB
HGB URINE DIPSTICK: NEGATIVE
Hgb urine dipstick: NEGATIVE
Ketones, ur: NEGATIVE mg/dL
Ketones, ur: NEGATIVE mg/dL
Leukocytes, UA: NEGATIVE
Leukocytes, UA: NEGATIVE
NITRITE: NEGATIVE
NITRITE: NEGATIVE
PH: 7.5 (ref 5.0–8.0)
Protein, ur: NEGATIVE mg/dL
Protein, ur: NEGATIVE mg/dL
SPECIFIC GRAVITY, URINE: 1.009 (ref 1.005–1.030)
Specific Gravity, Urine: 1.014 (ref 1.005–1.030)
UROBILINOGEN UA: 1 mg/dL (ref 0.0–1.0)
Urobilinogen, UA: 1 mg/dL (ref 0.0–1.0)
pH: 8.5 — ABNORMAL HIGH (ref 5.0–8.0)

## 2013-04-18 LAB — COMPREHENSIVE METABOLIC PANEL
ALBUMIN: 2.5 g/dL — AB (ref 3.5–5.2)
ALT: 21 U/L (ref 0–53)
AST: 17 U/L (ref 0–37)
Alkaline Phosphatase: 67 U/L (ref 39–117)
BUN: 22 mg/dL (ref 6–23)
CO2: 33 mEq/L — ABNORMAL HIGH (ref 19–32)
CREATININE: 0.88 mg/dL (ref 0.50–1.35)
Calcium: 8.3 mg/dL — ABNORMAL LOW (ref 8.4–10.5)
Chloride: 97 mEq/L (ref 96–112)
GFR calc Af Amer: >90 mL/min (ref >90)
GFR calc non Af Amer: 88 mL/min — ABNORMAL LOW (ref >90)
Glucose, Bld: 120 mg/dL — ABNORMAL HIGH (ref 70–99)
POTASSIUM: 3.6 meq/L — AB (ref 3.7–5.3)
Sodium: 139 mEq/L (ref 137–147)
Total Bilirubin: 0.6 mg/dL (ref 0.3–1.2)
Total Protein: 4.5 g/dL — ABNORMAL LOW (ref 6.0–8.3)

## 2013-04-18 LAB — CBC WITH DIFFERENTIAL/PLATELET
BASOS ABS: 0 10*3/uL (ref 0.0–0.1)
BASOS PCT: 0 % (ref 0–1)
Eosinophils Absolute: 0 10*3/uL (ref 0.0–0.7)
Eosinophils Relative: 0 % (ref 0–5)
HCT: 26.5 % — ABNORMAL LOW (ref 39.0–52.0)
Hemoglobin: 9.3 g/dL — ABNORMAL LOW (ref 13.0–17.0)
Lymphocytes Relative: 2 % — ABNORMAL LOW (ref 12–46)
Lymphs Abs: 0.1 10*3/uL — ABNORMAL LOW (ref 0.7–4.0)
MCH: 32.3 pg (ref 26.0–34.0)
MCHC: 35.1 g/dL (ref 30.0–36.0)
MCV: 92 fL (ref 78.0–100.0)
MONO ABS: 0.1 10*3/uL (ref 0.1–1.0)
Monocytes Relative: 2 % — ABNORMAL LOW (ref 3–12)
NEUTROS ABS: 4.4 10*3/uL (ref 1.7–7.7)
NEUTROS PCT: 96 % — AB (ref 43–77)
Platelets: 155 10*3/uL (ref 150–400)
RBC: 2.88 MIL/uL — ABNORMAL LOW (ref 4.22–5.81)
RDW: 19.1 % — AB (ref 11.5–15.5)
WBC: 4.6 10*3/uL (ref 4.0–10.5)

## 2013-04-18 LAB — GLUCOSE, CAPILLARY
GLUCOSE-CAPILLARY: 122 mg/dL — AB (ref 70–99)
GLUCOSE-CAPILLARY: 182 mg/dL — AB (ref 70–99)
Glucose-Capillary: 156 mg/dL — ABNORMAL HIGH (ref 70–99)
Glucose-Capillary: 191 mg/dL — ABNORMAL HIGH (ref 70–99)

## 2013-04-18 LAB — METHOTREXATE
Methotrexate: 2.36
Methotrexate: 2.08

## 2013-04-18 MED ORDER — LEUCOVORIN CALCIUM INJECTION 100 MG
20.0000 mg/m2 | INTRAMUSCULAR | Status: AC
  Administered 2013-04-18 – 2013-04-20 (×8): 38 mg via INTRAVENOUS
  Filled 2013-04-18 (×8): qty 1.9

## 2013-04-18 NOTE — Progress Notes (Signed)
IP PROGRESS NOTE  Subjective:   No complaint. No nausea.  Objective: Vital signs in last 24 hours: Blood pressure 128/60, pulse 62, temperature 97.6 F (36.4 C), temperature source Oral, resp. rate 14, height 5\' 9"  (1.753 m), weight 155 lb (70.308 kg), SpO2 100.00%.  Intake/Output from previous day: 02/28 0701 - 03/01 0700 In: 1703.7 [P.O.:720; I.V.:923.7; IV Piggyback:60] Out: 2150 [Urine:2150]  Physical Exam:  HEENT: No thrush or ulcers Lungs: Slight decrease in breath sounds over the right chest, no respiratory distress Cardiac: Regular rate and rhythm Abdomen: No hepatosplenomegaly Extremities: Trace pitting edema at the ankles bilaterally   Portacath/PICC-without erythema  Lab Results:  Recent Labs  04/17/13 0610 04/18/13 0600  WBC 4.7 4.6  HGB 9.4* 9.3*  HCT 27.0* 26.5*  PLT 151 155    BMET  Recent Labs  04/17/13 0610 04/18/13 0600  NA 142 139  K 3.3* 3.6*  CL 99 97  CO2 32 33*  GLUCOSE 113* 120*  BUN 20 22  CREATININE 0.91 0.88  CALCIUM 8.3* 8.3*   urine pH 8.5 04/18/2013 at 00 19: Methotrexate level 2.36 Studies/Results: No results found.  Medications: I have reviewed the patient's current medications.  Assessment/Plan:  1. CNS lymphoma-admitted for cycle 2 of  R-MVP,the high-dose methotrexate was given on 04/15/2013, now maintained on a leucovorin rescue  2. Diabetes-on Lantus insulin  3. DVT prophylaxis-on Lovenox  The methotrexate level remains elevated. He will continue bicarbonate hydration and the leucovorin rescue. The methotrexate level will be repeated in the a.m. on 04/19/2013.   LOS: 4 days   [place]  04/18/2013, 8:10 AM

## 2013-04-19 DIAGNOSIS — R609 Edema, unspecified: Secondary | ICD-10-CM

## 2013-04-19 DIAGNOSIS — M216X9 Other acquired deformities of unspecified foot: Secondary | ICD-10-CM

## 2013-04-19 LAB — URINALYSIS, DIPSTICK ONLY
Bilirubin Urine: NEGATIVE
Bilirubin Urine: NEGATIVE
Bilirubin Urine: NEGATIVE
GLUCOSE, UA: 100 mg/dL — AB
Glucose, UA: 100 mg/dL — AB
Glucose, UA: 250 mg/dL — AB
HGB URINE DIPSTICK: NEGATIVE
Hgb urine dipstick: NEGATIVE
Hgb urine dipstick: NEGATIVE
KETONES UR: NEGATIVE mg/dL
KETONES UR: NEGATIVE mg/dL
Ketones, ur: NEGATIVE mg/dL
LEUKOCYTES UA: NEGATIVE
Leukocytes, UA: NEGATIVE
Leukocytes, UA: NEGATIVE
Nitrite: NEGATIVE
Nitrite: NEGATIVE
Nitrite: NEGATIVE
PH: 8.5 — AB (ref 5.0–8.0)
PH: 8 (ref 5.0–8.0)
Protein, ur: NEGATIVE mg/dL
Protein, ur: NEGATIVE mg/dL
Protein, ur: NEGATIVE mg/dL
SPECIFIC GRAVITY, URINE: 1.014 (ref 1.005–1.030)
Specific Gravity, Urine: 1.01 (ref 1.005–1.030)
Specific Gravity, Urine: 1.014 (ref 1.005–1.030)
UROBILINOGEN UA: 0.2 mg/dL (ref 0.0–1.0)
Urobilinogen, UA: 1 mg/dL (ref 0.0–1.0)
Urobilinogen, UA: 1 mg/dL (ref 0.0–1.0)
pH: 8.5 — ABNORMAL HIGH (ref 5.0–8.0)

## 2013-04-19 LAB — COMPREHENSIVE METABOLIC PANEL
ALT: 22 U/L (ref 0–53)
AST: 17 U/L (ref 0–37)
Albumin: 2.6 g/dL — ABNORMAL LOW (ref 3.5–5.2)
Alkaline Phosphatase: 69 U/L (ref 39–117)
BUN: 21 mg/dL (ref 6–23)
CO2: 31 meq/L (ref 19–32)
Calcium: 8.5 mg/dL (ref 8.4–10.5)
Chloride: 94 mEq/L — ABNORMAL LOW (ref 96–112)
Creatinine, Ser: 0.81 mg/dL (ref 0.50–1.35)
GFR calc Af Amer: >90 mL/min (ref >90)
Glucose, Bld: 183 mg/dL — ABNORMAL HIGH (ref 70–99)
Potassium: 3.5 mEq/L — ABNORMAL LOW (ref 3.7–5.3)
Sodium: 136 mEq/L — ABNORMAL LOW (ref 137–147)
Total Bilirubin: 0.6 mg/dL (ref 0.3–1.2)
Total Protein: 4.9 g/dL — ABNORMAL LOW (ref 6.0–8.3)

## 2013-04-19 LAB — CBC
HEMATOCRIT: 28.8 % — AB (ref 39.0–52.0)
HEMOGLOBIN: 10 g/dL — AB (ref 13.0–17.0)
MCH: 32.1 pg (ref 26.0–34.0)
MCHC: 34.7 g/dL (ref 30.0–36.0)
MCV: 92.3 fL (ref 78.0–100.0)
Platelets: 151 10*3/uL (ref 150–400)
RBC: 3.12 MIL/uL — ABNORMAL LOW (ref 4.22–5.81)
RDW: 18.9 % — AB (ref 11.5–15.5)
WBC: 5.3 10*3/uL (ref 4.0–10.5)

## 2013-04-19 LAB — GLUCOSE, CAPILLARY
GLUCOSE-CAPILLARY: 230 mg/dL — AB (ref 70–99)
GLUCOSE-CAPILLARY: 161 mg/dL — AB (ref 70–99)
Glucose-Capillary: 143 mg/dL — ABNORMAL HIGH (ref 70–99)
Glucose-Capillary: 181 mg/dL — ABNORMAL HIGH (ref 70–99)

## 2013-04-19 LAB — METHOTREXATE: Methotrexate: 0.94

## 2013-04-19 MED ORDER — CYTARABINE LIPOSOME CHEMO INTRATHECAL 50 MG/5ML
50.0000 mg | Freq: Once | INTRATHECAL | Status: AC
  Administered 2013-04-20: 50 mg via INTRATHECAL
  Filled 2013-04-19: qty 5

## 2013-04-19 MED ORDER — HYDROCORTISONE SOD SUCCINATE 100 MG PF FOR IT USE
50.0000 mg | Freq: Once | INTRAMUSCULAR | Status: DC
Start: 2013-04-20 — End: 2013-04-20
  Filled 2013-04-19: qty 0.5

## 2013-04-19 NOTE — Progress Notes (Signed)
Keith Duffy is still doing okay. He's had no problems so far from the second cycle of chemotherapy. His methotrexate level yesterday was 2.08. Today's level is still pending.  He's had no nausea or vomiting. He's eating better. It looks like the right seventh cranial nerve dysfunction is better.  He's had no problems with diarrhea. She's not had a bowel movement in a couple days.  Pain has not been an issue. He's had no bleeding.  His vital signs look good. He is afebrile. His blood pressure is 150/82. Oral exam shows no mucositis. He has better motion of his right eye. Lungs are clear. Cardiac exam regular in rhythm. Abdomen is soft. Has good bowel sounds. Extremities shows some 1+ edema in his feet. He still has a left footdrop. Skin exam no rashes.  Labs were done today. They're pending.  Again, we have to await the of the methotrexate  level today.  His blood sugars are looking real good. His renal function has been good.  Hopefully , we will be able to discharge him today. If not, then we will do his intrathecal DepoCyt tomorrow.  [name]  Romans 5:3-5

## 2013-04-20 ENCOUNTER — Ambulatory Visit: Payer: Medicare Other

## 2013-04-20 ENCOUNTER — Ambulatory Visit: Payer: Medicare Other | Admitting: Hematology & Oncology

## 2013-04-20 ENCOUNTER — Other Ambulatory Visit: Payer: Self-pay | Admitting: Hematology & Oncology

## 2013-04-20 ENCOUNTER — Other Ambulatory Visit: Payer: Medicare Other | Admitting: Lab

## 2013-04-20 ENCOUNTER — Other Ambulatory Visit: Payer: Self-pay | Admitting: *Deleted

## 2013-04-20 DIAGNOSIS — C8590 Non-Hodgkin lymphoma, unspecified, unspecified site: Secondary | ICD-10-CM

## 2013-04-20 DIAGNOSIS — E876 Hypokalemia: Secondary | ICD-10-CM

## 2013-04-20 DIAGNOSIS — G529 Cranial nerve disorder, unspecified: Secondary | ICD-10-CM

## 2013-04-20 LAB — COMPREHENSIVE METABOLIC PANEL
ALT: 20 U/L (ref 0–53)
AST: 15 U/L (ref 0–37)
Albumin: 2.3 g/dL — ABNORMAL LOW (ref 3.5–5.2)
Alkaline Phosphatase: 57 U/L (ref 39–117)
BUN: 17 mg/dL (ref 6–23)
CALCIUM: 8.2 mg/dL — AB (ref 8.4–10.5)
CO2: 33 mEq/L — ABNORMAL HIGH (ref 19–32)
Chloride: 98 mEq/L (ref 96–112)
Creatinine, Ser: 0.78 mg/dL (ref 0.50–1.35)
GFR calc Af Amer: >90 mL/min (ref >90)
GFR calc non Af Amer: >90 mL/min (ref >90)
Glucose, Bld: 68 mg/dL — ABNORMAL LOW (ref 70–99)
Potassium: 3.3 mEq/L — ABNORMAL LOW (ref 3.7–5.3)
SODIUM: 138 meq/L (ref 137–147)
TOTAL PROTEIN: 4.2 g/dL — AB (ref 6.0–8.3)
Total Bilirubin: 0.5 mg/dL (ref 0.3–1.2)

## 2013-04-20 LAB — CBC WITH DIFFERENTIAL/PLATELET
BASOS ABS: 0 10*3/uL (ref 0.0–0.1)
BASOS PCT: 0 % (ref 0–1)
EOS ABS: 0 10*3/uL (ref 0.0–0.7)
EOS PCT: 0 % (ref 0–5)
HCT: 24.1 % — ABNORMAL LOW (ref 39.0–52.0)
Hemoglobin: 8.7 g/dL — ABNORMAL LOW (ref 13.0–17.0)
LYMPHS PCT: 4 % — AB (ref 12–46)
Lymphs Abs: 0.2 10*3/uL — ABNORMAL LOW (ref 0.7–4.0)
MCH: 33 pg (ref 26.0–34.0)
MCHC: 36.1 g/dL — ABNORMAL HIGH (ref 30.0–36.0)
MCV: 91.3 fL (ref 78.0–100.0)
Monocytes Absolute: 0 10*3/uL — ABNORMAL LOW (ref 0.1–1.0)
Monocytes Relative: 1 % — ABNORMAL LOW (ref 3–12)
Neutro Abs: 3.9 10*3/uL (ref 1.7–7.7)
Neutrophils Relative %: 95 % — ABNORMAL HIGH (ref 43–77)
PLATELETS: 122 10*3/uL — AB (ref 150–400)
RBC: 2.64 MIL/uL — ABNORMAL LOW (ref 4.22–5.81)
RDW: 18.8 % — AB (ref 11.5–15.5)
WBC: 4.1 10*3/uL (ref 4.0–10.5)

## 2013-04-20 LAB — GLUCOSE, CAPILLARY
GLUCOSE-CAPILLARY: 63 mg/dL — AB (ref 70–99)
GLUCOSE-CAPILLARY: 87 mg/dL (ref 70–99)

## 2013-04-20 LAB — URINALYSIS, DIPSTICK ONLY
BILIRUBIN URINE: NEGATIVE
GLUCOSE, UA: NEGATIVE mg/dL
Hgb urine dipstick: NEGATIVE
Ketones, ur: NEGATIVE mg/dL
LEUKOCYTES UA: NEGATIVE
Nitrite: NEGATIVE
PH: 8.5 — AB (ref 5.0–8.0)
Protein, ur: NEGATIVE mg/dL
Specific Gravity, Urine: 1.01 (ref 1.005–1.030)
Urobilinogen, UA: 0.2 mg/dL (ref 0.0–1.0)

## 2013-04-20 LAB — LACTATE DEHYDROGENASE: LDH: 198 U/L (ref 94–250)

## 2013-04-20 LAB — METHOTREXATE: Methotrexate: 0.32

## 2013-04-20 MED ORDER — DEXAMETHASONE 4 MG PO TABS
4.0000 mg | ORAL_TABLET | Freq: Three times a day (TID) | ORAL | Status: DC
  Administered 2013-04-20: 4 mg via ORAL
  Filled 2013-04-20 (×4): qty 1

## 2013-04-20 MED ORDER — TBO-FILGRASTIM 480 MCG/0.8ML ~~LOC~~ SOSY
480.0000 ug | PREFILLED_SYRINGE | Freq: Once | SUBCUTANEOUS | Status: AC
  Administered 2013-04-20: 480 ug via SUBCUTANEOUS
  Filled 2013-04-20: qty 0.8

## 2013-04-20 MED ORDER — DEXAMETHASONE 4 MG PO TABS: 4.0000 mg | ORAL_TABLET | Freq: Three times a day (TID) | ORAL | Status: DC

## 2013-04-20 MED ORDER — KETOCONAZOLE 200 MG PO TABS: 200.0000 mg | ORAL_TABLET | Freq: Every day | ORAL | Status: DC

## 2013-04-20 MED ORDER — LEUCOVORIN CALCIUM INJECTION 100 MG
38.0000 mg | Freq: Once | INTRAMUSCULAR | Status: AC
  Administered 2013-04-20: 38 mg via INTRAVENOUS
  Filled 2013-04-20: qty 1.9

## 2013-04-20 MED ORDER — MELOXICAM 15 MG PO TABS: 15.0000 mg | ORAL_TABLET | Freq: Every morning | ORAL | Status: DC

## 2013-04-20 MED ORDER — HEPARIN SOD (PORK) LOCK FLUSH 100 UNIT/ML IV SOLN
500.0000 [IU] | INTRAVENOUS | Status: AC | PRN
  Administered 2013-04-20: 500 [IU]
  Filled 2013-04-20: qty 5

## 2013-04-20 MED ORDER — FUROSEMIDE 10 MG/ML IJ SOLN
40.0000 mg | Freq: Once | INTRAMUSCULAR | Status: AC
  Administered 2013-04-20: 40 mg via INTRAVENOUS
  Filled 2013-04-20: qty 4

## 2013-04-20 MED ORDER — LEUCOVORIN CALCIUM INJECTION 100 MG
40.0000 mg | Freq: Once | INTRAMUSCULAR | Status: DC
  Filled 2013-04-20: qty 2

## 2013-04-20 MED ORDER — CHLORHEXIDINE GLUCONATE 0.12 % MT SOLN: 15.0000 mL | Freq: Four times a day (QID) | OROMUCOSAL | Status: DC

## 2013-04-20 NOTE — Progress Notes (Signed)
Patient received discharge instructions with patient's wife at bedside and both verbalized understanding without further questions. Patient follow up appointments and medications discussed. Patient belongings packed. Patient to be taken downstairs via wheelchair to be discharged home

## 2013-04-20 NOTE — Progress Notes (Signed)
Patient received discharge instructions with wife at bedside. Patient verbalized understanding of medications, follow up appointments. Patient to be taken downstairs via wheelchair to be discharged home.

## 2013-04-20 NOTE — Progress Notes (Signed)
Inpatient Diabetes Program Recommendations  AACE/ADA: New Consensus Statement on Inpatient Glycemic Control (2013)  Target Ranges:  Prepandial:   less than 140 mg/dL      Peak postprandial:   less than 180 mg/dL (1-2 hours)      Critically ill patients:  140 - 180 mg/dL   Reason for Assessment: Note patient receiving possible last steroid dose associated with chemo today  Note: Glycemic control has been adequate-- especially given steroid use with chemo.  Insulin regimen will need to be reassessed tomorrow with possible need to titrate downward since last dose of steroid may be the solucortef scheduled for 7 am today.  Thank you.  Sharleen Szczesny S. Marcelline Mates, RN, CNS, CDE Inpatient Diabetes Program, team pager [phone]

## 2013-04-20 NOTE — Discharge Summary (Signed)
NAME:  Keith Duffy, Keith Duffy NO.:  [id number]  MEDICAL RECORD NO.:  [id number]  LOCATION:  82                         FACILITY:  [facility]  PHYSICIAN:  Volanda Napoleon, M.D.  DATE OF BIRTH:  06-19-1945  DATE OF ADMISSION:  04/14/2013 DATE OF DISCHARGE:  04/20/2013                              DISCHARGE SUMMARY   DIAGNOSES ON DISCHARGE: 1. 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 2. Cycle #2 of R-MTV. 3. Injection of third cycle of deficit with hydrocortisone. 4. Insulin-dependent diabetes. 5. Right cranial nerve VII palsy. 6. Left footdrop. 7. Transient hypokalemia.  CONDITION ON DISCHARGE:  Stable.  ACTIVITIES:  As tolerated.  DIET:  Without restrictions.  He is diabetic, so he watches his sugar intake.  FOLLOWUP:  The patient will come to the [facility] on April 21, 2013, for Neulasta and lab work.  His medications upon discharge, Peridex mouth rinse 15 mL p.o., Rinse and Spit q.i.d. Decadron 4 mg p.o. q.8 hours x3 days, Colace 100 mg p.o. daily, Famvir 250 mg p.o. daily, fentanyl patch 12.5 mcg to the skin every 3 days, Lantus insulin as scheduled, ketoconazole 200 mg p.o. daily, Levaquin 5 mg p.o. daily, Ativan 0.5 mg q.6 hours p.o. p.r.n. nausea and vomiting, Mobic 15 mg p.o. q.a.m., metoprolol 25 mg p.o. b.i.d., Zofran 8 mg p.o. b.i.d. p.r.n., oxycodone 10-20 mg p.o. q.4 hours p.r.n., Compazine 10 mg p.o. q.6 hours p.r.n., Flomax 0.4 mg p.o. daily as needed, sodium bicarb mouth rinse as needed.  HOSPITAL COURSE:  Mr. Cassada was admitted on the 25th.  He was in for his second cycle of chemotherapy.  He had relapse of his large cell non- Hodgkin lymphoma to the CNS. We accessed his Port-A-Cath.  We went ahead and got him started on IV fluids with sodium bicarb. We started his chemotherapy on the day of admission.  He got Rituxan dose of 1000 mg.  His Thiotepa was given had at a dose of 65 mg. Vincristine was given at 2  mg.  On day #2, his methotrexate was given.  He got a bolus dose of methotrexate of 2.3 g.  This was given over 1 hour.  He then got a 23- hour infusion of 10.5 g.  He tolerated this well.  He did have some potassium issues, and we replaced the potassium. He then got leucovorin rescue starting 12 hours after completion of methotrexate.  He tolerated this well.  His methotrexate levels were little bit slow to come down.  On April 18, 2013, methotrexate level was 2.1.  On April 19, 2013, his methotrexate level was 0.94.  We continued methotrexate dose every 4 hours. He had no nausea and vomiting after chemotherapy.  We gave him Zofran and Decadron on a schedule. We monitored his lab work.  I did put him on some Neupogen.  He got 300 mcg during his hospital course.  On the day of discharge, I gave him a dose of 480 mcg.  He had good urine output.  His urine remained alkaline throughout the hospital stay.  His blood sugars were low on the high side.  These were covered.  It seemed like he  may have had little bit of improvement with his right cranial nerve VII palsy.  It seemed to be able to close his eyes a little bit better.  He had a little bit of diarrhea, I think, on the 27th.  We held his Colace.  On the day of discharge, I did go ahead and give him a dose of DepoCyt into the Ommaya reservoir.  He got dose of 50 mg of DepoCyt.  Also gave him 50 mg of hydrocortisone following DepoCyt to the Ommaya.  He tolerated this well.  He really wanted go home.  I really felt his methotrexate level would be low enough that we could get him home.  I did give him, my final dose of Leucovorin at 40 mg.  His lab work upon discharge looked pretty good.  Potassium was 3.3.  BUN was 17, creatinine 0.83.  His white cell count is 4.1, hemoglobin 8.7, hematocrit 24.1, platelet count 122.  His liver function tests were okay.  His albumin was 2.3.  His LDH was 198.  With his spinal fluid, I did send  this off for cytology.  PHYSICAL EXAMINATION:  VITAL SIGNS:  Upon discharge showed temperature 98.2, pulse 66, blood pressure 132/65. Head and Neck exam:  Improvement of the right cranial nerve VII palsy. He had no mucositis.  There is no scleral icterus. LUNGS:  Clear. CARDIAC:  Regular rate and rhythm with normal S1, S2.  There are no murmurs, rubs, or bruits.  ABDOMEN:  Soft.  He has good bowel sounds. There is no fluid wave.  There is no palpable hepatosplenomegaly. EXTREMITIES:  Some swelling in his feet bilaterally.  There is some swelling in his ankles.  He still has a left foot drop. SKIN:  No rashes. NEUROLOGICAL:  No focal neurological deficits outside of the right cranial nerve VII palsy and left foot drop.     Volanda Napoleon, M.D.     PRE/MEDQ  D:  04/20/2013  T:  04/20/2013  Job:  [id number]

## 2013-04-20 NOTE — Procedures (Signed)
I did a injection into his Ommaya reservoir this morning. We injected DepoCyt and hydrocortisone. The Ommaya site was thoroughly prepped. We used Betadine and alcohol. We used a butterfly needle and obtained clear spinal fluid. I obtained a 4 cc. We sent this off for cytology.  I then injected 50 mg of DepoCyt and 50 mg of hydrocortisone into the Ommaya. This was done without difficulty.  He tolerated this well. I placed a bandage over the injection site.  Keith E.

## 2013-04-20 NOTE — Discharge Summary (Signed)
#   [id number] is d/c summary.  [name]

## 2013-04-21 ENCOUNTER — Other Ambulatory Visit (HOSPITAL_BASED_OUTPATIENT_CLINIC_OR_DEPARTMENT_OTHER): Payer: Medicare Other | Admitting: Lab

## 2013-04-21 ENCOUNTER — Ambulatory Visit (HOSPITAL_BASED_OUTPATIENT_CLINIC_OR_DEPARTMENT_OTHER): Payer: Medicare Other

## 2013-04-21 VITALS — BP 135/65 | HR 64 | Temp 97.6°F | Resp 18

## 2013-04-21 DIAGNOSIS — C7949 Secondary malignant neoplasm of other parts of nervous system: Secondary | ICD-10-CM

## 2013-04-21 DIAGNOSIS — C799 Secondary malignant neoplasm of unspecified site: Secondary | ICD-10-CM

## 2013-04-21 DIAGNOSIS — Z5189 Encounter for other specified aftercare: Secondary | ICD-10-CM

## 2013-04-21 DIAGNOSIS — C8589 Other specified types of non-Hodgkin lymphoma, extranodal and solid organ sites: Secondary | ICD-10-CM

## 2013-04-21 DIAGNOSIS — C801 Malignant (primary) neoplasm, unspecified: Principal | ICD-10-CM

## 2013-04-21 LAB — CBC WITH DIFFERENTIAL (CANCER CENTER ONLY)
BASO#: 0 10*3/uL (ref 0.0–0.2)
BASO%: 0.1 % (ref 0.0–2.0)
EOS%: 0 % (ref 0.0–7.0)
Eosinophils Absolute: 0 10*3/uL (ref 0.0–0.5)
HCT: 29.2 % — ABNORMAL LOW (ref 38.7–49.9)
HEMOGLOBIN: 10 g/dL — AB (ref 13.0–17.1)
LYMPH#: 0.1 10*3/uL — AB (ref 0.9–3.3)
LYMPH%: 0.5 % — ABNORMAL LOW (ref 14.0–48.0)
MCH: 32.5 pg (ref 28.0–33.4)
MCHC: 34.2 g/dL (ref 32.0–35.9)
MCV: 95 fL (ref 82–98)
MONO#: 0.1 10*3/uL (ref 0.1–0.9)
MONO%: 0.5 % (ref 0.0–13.0)
NEUT%: 98.9 % — ABNORMAL HIGH (ref 40.0–80.0)
NEUTROS ABS: 13.6 10*3/uL — AB (ref 1.5–6.5)
PLATELETS: 130 10*3/uL — AB (ref 145–400)
RBC: 3.08 10*6/uL — ABNORMAL LOW (ref 4.20–5.70)
RDW: 19.2 % — ABNORMAL HIGH (ref 11.1–15.7)
WBC: 13.7 10*3/uL — AB (ref 4.0–10.0)

## 2013-04-21 LAB — CMP (CANCER CENTER ONLY)
ALBUMIN: 2.7 g/dL — AB (ref 3.3–5.5)
ALT: 23 U/L (ref 10–47)
AST: 16 U/L (ref 11–38)
Alkaline Phosphatase: 67 U/L (ref 26–84)
BILIRUBIN TOTAL: 0.8 mg/dL (ref 0.20–1.60)
BUN, Bld: 22 mg/dL (ref 7–22)
CHLORIDE: 98 meq/L (ref 98–108)
CO2: 33 meq/L (ref 18–33)
Calcium: 8.7 mg/dL (ref 8.0–10.3)
Creat: 0.9 mg/dl (ref 0.6–1.2)
Glucose, Bld: 241 mg/dL — ABNORMAL HIGH (ref 73–118)
POTASSIUM: 3.5 meq/L (ref 3.3–4.7)
SODIUM: 137 meq/L (ref 128–145)
TOTAL PROTEIN: 5.3 g/dL — AB (ref 6.4–8.1)

## 2013-04-21 LAB — LACTATE DEHYDROGENASE: LDH: 227 U/L (ref 94–250)

## 2013-04-21 LAB — TECHNOLOGIST REVIEW CHCC SATELLITE

## 2013-04-21 MED ORDER — HEPARIN SOD (PORK) LOCK FLUSH 100 UNIT/ML IV SOLN
500.0000 [IU] | Freq: Once | INTRAVENOUS | Status: AC
  Administered 2013-04-21: 500 [IU] via INTRAVENOUS
  Filled 2013-04-21: qty 5

## 2013-04-21 MED ORDER — PEGFILGRASTIM INJECTION 6 MG/0.6ML
6.0000 mg | Freq: Once | SUBCUTANEOUS | Status: AC
  Administered 2013-04-21: 6 mg via SUBCUTANEOUS

## 2013-04-21 MED ORDER — DEXAMETHASONE SODIUM PHOSPHATE 20 MG/5ML IJ SOLN
20.0000 mg | Freq: Once | INTRAMUSCULAR | Status: AC
  Administered 2013-04-21: 20 mg via INTRAVENOUS

## 2013-04-21 MED ORDER — SODIUM CHLORIDE 0.9 % IJ SOLN
10.0000 mL | Freq: Once | INTRAMUSCULAR | Status: AC
  Administered 2013-04-21: 10 mL via INTRAVENOUS
  Filled 2013-04-21: qty 10

## 2013-04-21 MED ORDER — PEGFILGRASTIM INJECTION 6 MG/0.6ML
SUBCUTANEOUS | Status: AC
  Filled 2013-04-21: qty 0.6

## 2013-04-21 MED ORDER — SODIUM CHLORIDE 0.9 % IV SOLN
Freq: Once | INTRAVENOUS | Status: AC
  Administered 2013-04-21: 11:00:00 via INTRAVENOUS

## 2013-04-21 MED ORDER — DEXAMETHASONE SODIUM PHOSPHATE 20 MG/5ML IJ SOLN
INTRAMUSCULAR | Status: AC
  Filled 2013-04-21: qty 5

## 2013-04-21 NOTE — Patient Instructions (Signed)
Dehydration, Adult Dehydration is when you lose more fluids from the body than you take in. Vital organs like the kidneys, brain, and heart cannot function without a proper amount of fluids and salt. Any loss of fluids from the body can cause dehydration.  CAUSES   Vomiting.  Diarrhea.  Excessive sweating.  Excessive urine output.  Fever. SYMPTOMS  Mild dehydration  Thirst.  Dry lips.  Slightly dry mouth. Moderate dehydration  Very dry mouth.  Sunken eyes.  Skin does not bounce back quickly when lightly pinched and released.  Dark urine and decreased urine production.  Decreased tear production.  Headache. Severe dehydration  Very dry mouth.  Extreme thirst.  Rapid, weak pulse (more than 100 beats per minute at rest).  Cold hands and feet.  Not able to sweat in spite of heat and temperature.  Rapid breathing.  Blue lips.  Confusion and lethargy.  Difficulty being awakened.  Minimal urine production.  No tears. DIAGNOSIS  Your caregiver will diagnose dehydration based on your symptoms and your exam. Blood and urine tests will help confirm the diagnosis. The diagnostic evaluation should also identify the cause of dehydration. TREATMENT  Treatment of mild or moderate dehydration can often be done at home by increasing the amount of fluids that you drink. It is best to drink small amounts of fluid more often. Drinking too much at one time can make vomiting worse. Refer to the home care instructions below. Severe dehydration needs to be treated at the hospital where you will probably be given intravenous (IV) fluids that contain water and electrolytes. HOME CARE INSTRUCTIONS   Ask your caregiver about specific rehydration instructions.  Drink enough fluids to keep your urine clear or pale yellow.  Drink small amounts frequently if you have nausea and vomiting.  Eat as you normally do.  Avoid:  Foods or drinks high in sugar.  Carbonated  drinks.  Juice.  Extremely hot or cold fluids.  Drinks with caffeine.  Fatty, greasy foods.  Alcohol.  Tobacco.  Overeating.  Gelatin desserts.  Wash your hands well to avoid spreading bacteria and viruses.  Only take over-the-counter or prescription medicines for pain, discomfort, or fever as directed by your caregiver.  Ask your caregiver if you should continue all prescribed and over-the-counter medicines.  Keep all follow-up appointments with your caregiver. SEEK MEDICAL CARE IF:  You have abdominal pain and it increases or stays in one area (localizes).  You have a rash, stiff neck, or severe headache.  You are irritable, sleepy, or difficult to awaken.  You are weak, dizzy, or extremely thirsty. SEEK IMMEDIATE MEDICAL CARE IF:   You are unable to keep fluids down or you get worse despite treatment.  You have frequent episodes of vomiting or diarrhea.  You have blood or green matter (bile) in your vomit.  You have blood in your stool or your stool looks black and tarry.  You have not urinated in 6 to 8 hours, or you have only urinated a small amount of very dark urine.  You have a fever.  You faint. MAKE SURE YOU:   Understand these instructions.  Will watch your condition.  Will get help right away if you are not doing well or get worse. Document Released: 02/04/2005 Document Revised: 04/29/2011 Document Reviewed: 09/24/2010 ExitCare Patient Information 2014 [employer].  

## 2013-04-25 ENCOUNTER — Emergency Department (HOSPITAL_COMMUNITY): Payer: Medicare Other

## 2013-04-25 ENCOUNTER — Other Ambulatory Visit: Payer: Self-pay

## 2013-04-25 ENCOUNTER — Encounter (HOSPITAL_COMMUNITY): Payer: Self-pay | Admitting: Emergency Medicine

## 2013-04-25 ENCOUNTER — Observation Stay (HOSPITAL_COMMUNITY): Payer: Medicare Other

## 2013-04-25 ENCOUNTER — Inpatient Hospital Stay (HOSPITAL_COMMUNITY)
Admission: EM | Admit: 2013-04-25 | Discharge: 2013-05-10 | DRG: 808 | Disposition: A | Discharge status: Home Health | Payer: Medicare Other | Attending: Hematology & Oncology | Admitting: Hematology & Oncology

## 2013-04-25 DIAGNOSIS — E860 Dehydration: Secondary | ICD-10-CM | POA: Diagnosis present

## 2013-04-25 DIAGNOSIS — R5081 Fever presenting with conditions classified elsewhere: Secondary | ICD-10-CM | POA: Diagnosis present

## 2013-04-25 DIAGNOSIS — M48061 Spinal stenosis, lumbar region without neurogenic claudication: Secondary | ICD-10-CM | POA: Diagnosis present

## 2013-04-25 DIAGNOSIS — R300 Dysuria: Secondary | ICD-10-CM | POA: Diagnosis not present

## 2013-04-25 DIAGNOSIS — C799 Secondary malignant neoplasm of unspecified site: Secondary | ICD-10-CM | POA: Diagnosis present

## 2013-04-25 DIAGNOSIS — G894 Chronic pain syndrome: Secondary | ICD-10-CM | POA: Diagnosis present

## 2013-04-25 DIAGNOSIS — R5381 Other malaise: Secondary | ICD-10-CM | POA: Diagnosis present

## 2013-04-25 DIAGNOSIS — N39498 Other specified urinary incontinence: Secondary | ICD-10-CM | POA: Diagnosis present

## 2013-04-25 DIAGNOSIS — C8589 Other specified types of non-Hodgkin lymphoma, extranodal and solid organ sites: Secondary | ICD-10-CM | POA: Diagnosis present

## 2013-04-25 DIAGNOSIS — K5100 Ulcerative (chronic) pancolitis without complications: Secondary | ICD-10-CM | POA: Diagnosis present

## 2013-04-25 DIAGNOSIS — D638 Anemia in other chronic diseases classified elsewhere: Secondary | ICD-10-CM

## 2013-04-25 DIAGNOSIS — Z79899 Other long term (current) drug therapy: Secondary | ICD-10-CM

## 2013-04-25 DIAGNOSIS — IMO0001 Reserved for inherently not codable concepts without codable children: Secondary | ICD-10-CM

## 2013-04-25 DIAGNOSIS — G510 Bell's palsy: Secondary | ICD-10-CM | POA: Diagnosis present

## 2013-04-25 DIAGNOSIS — C801 Malignant (primary) neoplasm, unspecified: Secondary | ICD-10-CM

## 2013-04-25 DIAGNOSIS — K5900 Constipation, unspecified: Secondary | ICD-10-CM | POA: Diagnosis present

## 2013-04-25 DIAGNOSIS — D709 Neutropenia, unspecified: Secondary | ICD-10-CM | POA: Diagnosis present

## 2013-04-25 DIAGNOSIS — K1370 Unspecified lesions of oral mucosa: Secondary | ICD-10-CM | POA: Diagnosis present

## 2013-04-25 DIAGNOSIS — G8929 Other chronic pain: Secondary | ICD-10-CM | POA: Diagnosis present

## 2013-04-25 DIAGNOSIS — M216X9 Other acquired deformities of unspecified foot: Secondary | ICD-10-CM | POA: Diagnosis present

## 2013-04-25 DIAGNOSIS — R5383 Other fatigue: Secondary | ICD-10-CM

## 2013-04-25 DIAGNOSIS — I10 Essential (primary) hypertension: Secondary | ICD-10-CM | POA: Diagnosis present

## 2013-04-25 DIAGNOSIS — R531 Weakness: Secondary | ICD-10-CM | POA: Diagnosis present

## 2013-04-25 DIAGNOSIS — E1165 Type 2 diabetes mellitus with hyperglycemia: Secondary | ICD-10-CM

## 2013-04-25 DIAGNOSIS — D6481 Anemia due to antineoplastic chemotherapy: Secondary | ICD-10-CM | POA: Diagnosis present

## 2013-04-25 DIAGNOSIS — IMO0002 Reserved for concepts with insufficient information to code with codable children: Secondary | ICD-10-CM

## 2013-04-25 DIAGNOSIS — Z885 Allergy status to narcotic agent status: Secondary | ICD-10-CM

## 2013-04-25 DIAGNOSIS — Z91018 Allergy to other foods: Secondary | ICD-10-CM

## 2013-04-25 DIAGNOSIS — E876 Hypokalemia: Secondary | ICD-10-CM | POA: Diagnosis present

## 2013-04-25 DIAGNOSIS — R112 Nausea with vomiting, unspecified: Secondary | ICD-10-CM

## 2013-04-25 DIAGNOSIS — Z9221 Personal history of antineoplastic chemotherapy: Secondary | ICD-10-CM

## 2013-04-25 DIAGNOSIS — E119 Type 2 diabetes mellitus without complications: Secondary | ICD-10-CM

## 2013-04-25 DIAGNOSIS — Z794 Long term (current) use of insulin: Secondary | ICD-10-CM

## 2013-04-25 DIAGNOSIS — R339 Retention of urine, unspecified: Secondary | ICD-10-CM | POA: Diagnosis not present

## 2013-04-25 DIAGNOSIS — T451X5A Adverse effect of antineoplastic and immunosuppressive drugs, initial encounter: Principal | ICD-10-CM | POA: Diagnosis present

## 2013-04-25 DIAGNOSIS — C8590 Non-Hodgkin lymphoma, unspecified, unspecified site: Secondary | ICD-10-CM | POA: Diagnosis present

## 2013-04-25 DIAGNOSIS — E1142 Type 2 diabetes mellitus with diabetic polyneuropathy: Secondary | ICD-10-CM | POA: Diagnosis present

## 2013-04-25 DIAGNOSIS — D61818 Other pancytopenia: Secondary | ICD-10-CM

## 2013-04-25 DIAGNOSIS — E1149 Type 2 diabetes mellitus with other diabetic neurological complication: Secondary | ICD-10-CM | POA: Diagnosis present

## 2013-04-25 DIAGNOSIS — E43 Unspecified severe protein-calorie malnutrition: Secondary | ICD-10-CM | POA: Diagnosis present

## 2013-04-25 DIAGNOSIS — R64 Cachexia: Secondary | ICD-10-CM | POA: Diagnosis present

## 2013-04-25 DIAGNOSIS — D61810 Antineoplastic chemotherapy induced pancytopenia: Principal | ICD-10-CM | POA: Diagnosis present

## 2013-04-25 DIAGNOSIS — C7949 Secondary malignant neoplasm of other parts of nervous system: Secondary | ICD-10-CM

## 2013-04-25 LAB — I-STAT CHEM 8, ED
BUN: 23 mg/dL (ref 6–23)
CHLORIDE: 99 meq/L (ref 96–112)
Calcium, Ion: 1.12 mmol/L — ABNORMAL LOW (ref 1.13–1.30)
Creatinine, Ser: 0.8 mg/dL (ref 0.50–1.35)
Glucose, Bld: 224 mg/dL — ABNORMAL HIGH (ref 70–99)
HCT: 27 % — ABNORMAL LOW (ref 39.0–52.0)
Hemoglobin: 9.2 g/dL — ABNORMAL LOW (ref 13.0–17.0)
Potassium: 3.4 mEq/L — ABNORMAL LOW (ref 3.7–5.3)
SODIUM: 135 meq/L — AB (ref 137–147)
TCO2: 23 mmol/L (ref 0–100)

## 2013-04-25 LAB — GLUCOSE, CAPILLARY
Glucose-Capillary: 157 mg/dL — ABNORMAL HIGH (ref 70–99)
Glucose-Capillary: 148 mg/dL — ABNORMAL HIGH (ref 70–99)
Glucose-Capillary: 137 mg/dL — ABNORMAL HIGH (ref 70–99)

## 2013-04-25 LAB — COMPREHENSIVE METABOLIC PANEL
ALT: 15 U/L (ref 0–53)
AST: 12 U/L (ref 0–37)
Albumin: 2.8 g/dL — ABNORMAL LOW (ref 3.5–5.2)
Alkaline Phosphatase: 65 U/L (ref 39–117)
BUN: 23 mg/dL (ref 6–23)
CHLORIDE: 98 meq/L (ref 96–112)
CO2: 22 meq/L (ref 19–32)
CREATININE: 0.72 mg/dL (ref 0.50–1.35)
Calcium: 8 mg/dL — ABNORMAL LOW (ref 8.4–10.5)
GFR calc Af Amer: >90 mL/min (ref >90)
Glucose, Bld: 231 mg/dL — ABNORMAL HIGH (ref 70–99)
Potassium: 3.6 mEq/L — ABNORMAL LOW (ref 3.7–5.3)
Sodium: 134 mEq/L — ABNORMAL LOW (ref 137–147)
Total Bilirubin: 1.3 mg/dL — ABNORMAL HIGH (ref 0.3–1.2)
Total Protein: 4.9 g/dL — ABNORMAL LOW (ref 6.0–8.3)

## 2013-04-25 LAB — URINALYSIS, ROUTINE W REFLEX MICROSCOPIC
Bilirubin Urine: NEGATIVE
GLUCOSE, UA: 500 mg/dL — AB
Hgb urine dipstick: NEGATIVE
KETONES UR: NEGATIVE mg/dL
Leukocytes, UA: NEGATIVE
Nitrite: NEGATIVE
PH: 7.5 (ref 5.0–8.0)
Protein, ur: NEGATIVE mg/dL
SPECIFIC GRAVITY, URINE: 1.018 (ref 1.005–1.030)
Urobilinogen, UA: 0.2 mg/dL (ref 0.0–1.0)

## 2013-04-25 LAB — CBC
HCT: 20.1 % — ABNORMAL LOW (ref 39.0–52.0)
Hemoglobin: 7.1 g/dL — ABNORMAL LOW (ref 13.0–17.0)
MCH: 32.6 pg (ref 26.0–34.0)
MCHC: 35.3 g/dL (ref 30.0–36.0)
MCV: 92.2 fL (ref 78.0–100.0)
PLATELETS: 43 10*3/uL — AB (ref 150–400)
RBC: 2.18 MIL/uL — ABNORMAL LOW (ref 4.22–5.81)
RDW: 19.6 % — ABNORMAL HIGH (ref 11.5–15.5)
WBC: 0.2 10*3/uL — AB (ref 4.0–10.5)

## 2013-04-25 LAB — CBG MONITORING, ED: Glucose-Capillary: 196 mg/dL — ABNORMAL HIGH (ref 70–99)

## 2013-04-25 LAB — TROPONIN I

## 2013-04-25 LAB — LIPASE, BLOOD: Lipase: 16 U/L (ref 11–59)

## 2013-04-25 LAB — I-STAT CG4 LACTIC ACID, ED: Lactic Acid, Venous: 2.56 mmol/L — ABNORMAL HIGH (ref 0.5–2.2)

## 2013-04-25 MED ORDER — MELOXICAM 15 MG PO TABS
15.0000 mg | ORAL_TABLET | Freq: Every morning | ORAL | Status: DC
  Administered 2013-04-25: 15 mg via ORAL
  Filled 2013-04-25 (×2): qty 1

## 2013-04-25 MED ORDER — INSULIN GLARGINE 100 UNIT/ML ~~LOC~~ SOLN
25.0000 [IU] | Freq: Every day | SUBCUTANEOUS | Status: DC
  Administered 2013-04-30: 25 [IU] via SUBCUTANEOUS
  Filled 2013-04-25 (×6): qty 0.25

## 2013-04-25 MED ORDER — METOPROLOL TARTRATE 25 MG PO TABS
25.0000 mg | ORAL_TABLET | Freq: Two times a day (BID) | ORAL | Status: DC
  Administered 2013-04-25 – 2013-05-10 (×28): 25 mg via ORAL
  Filled 2013-04-25 (×34): qty 1

## 2013-04-25 MED ORDER — FLEET ENEMA 7-19 GM/118ML RE ENEM: 1.0000 | ENEMA | Freq: Once | RECTAL | Status: AC | PRN

## 2013-04-25 MED ORDER — ACETAMINOPHEN 325 MG PO TABS
650.0000 mg | ORAL_TABLET | Freq: Four times a day (QID) | ORAL | Status: DC | PRN
  Administered 2013-04-26 – 2013-04-28 (×2): 650 mg via ORAL
  Filled 2013-04-25 (×3): qty 2

## 2013-04-25 MED ORDER — ACETAMINOPHEN 650 MG RE SUPP: 650.0000 mg | Freq: Four times a day (QID) | RECTAL | Status: DC | PRN

## 2013-04-25 MED ORDER — SODIUM CHLORIDE 0.9 % IV SOLN
INTRAVENOUS | Status: AC
  Administered 2013-04-25 – 2013-04-29 (×3): via INTRAVENOUS

## 2013-04-25 MED ORDER — ARTIFICIAL TEARS OP OINT
TOPICAL_OINTMENT | Freq: Three times a day (TID) | OPHTHALMIC | Status: DC
  Administered 2013-04-26 – 2013-05-10 (×27): via OPHTHALMIC
  Filled 2013-04-25 (×2): qty 3.5

## 2013-04-25 MED ORDER — INSULIN ASPART 100 UNIT/ML ~~LOC~~ SOLN: 0.0000 [IU] | Freq: Every day | SUBCUTANEOUS | Status: DC

## 2013-04-25 MED ORDER — CIPROFLOXACIN IN D5W 400 MG/200ML IV SOLN
400.0000 mg | Freq: Two times a day (BID) | INTRAVENOUS | Status: DC
  Administered 2013-04-25 – 2013-04-28 (×6): 400 mg via INTRAVENOUS
  Filled 2013-04-25 (×6): qty 200

## 2013-04-25 MED ORDER — ONDANSETRON HCL 4 MG/2ML IJ SOLN
4.0000 mg | Freq: Four times a day (QID) | INTRAMUSCULAR | Status: DC | PRN
  Administered 2013-04-25: 4 mg via INTRAVENOUS
  Filled 2013-04-25: qty 2

## 2013-04-25 MED ORDER — FAMCICLOVIR 500 MG PO TABS
250.0000 mg | ORAL_TABLET | Freq: Every day | ORAL | Status: DC
  Administered 2013-04-25 – 2013-04-26 (×2): 250 mg via ORAL
  Filled 2013-04-25 (×3): qty 0.5

## 2013-04-25 MED ORDER — FENTANYL 12 MCG/HR TD PT72
12.5000 ug | MEDICATED_PATCH | TRANSDERMAL | Status: DC
  Administered 2013-04-27 – 2013-05-06 (×4): 12.5 ug via TRANSDERMAL
  Filled 2013-04-25 (×4): qty 1

## 2013-04-25 MED ORDER — CHLORHEXIDINE GLUCONATE 0.12 % MT SOLN
15.0000 mL | Freq: Four times a day (QID) | OROMUCOSAL | Status: DC
  Filled 2013-04-25 (×8): qty 15

## 2013-04-25 MED ORDER — SORBITOL 70 % SOLN: 30.0000 mL | Freq: Every day | Status: DC | PRN

## 2013-04-25 MED ORDER — METRONIDAZOLE IN NACL 5-0.79 MG/ML-% IV SOLN
500.0000 mg | Freq: Three times a day (TID) | INTRAVENOUS | Status: DC
  Administered 2013-04-25 – 2013-04-28 (×8): 500 mg via INTRAVENOUS
  Filled 2013-04-25 (×9): qty 100

## 2013-04-25 MED ORDER — POLYETHYLENE GLYCOL 3350 17 G PO PACK
17.0000 g | PACK | Freq: Every day | ORAL | Status: DC | PRN
  Filled 2013-04-25: qty 1

## 2013-04-25 MED ORDER — DOCUSATE SODIUM 100 MG PO CAPS
100.0000 mg | ORAL_CAPSULE | Freq: Two times a day (BID) | ORAL | Status: DC
Start: 2013-04-25 — End: 2013-05-04
  Administered 2013-04-25 – 2013-05-03 (×6): 100 mg via ORAL
  Filled 2013-04-25 (×20): qty 1

## 2013-04-25 MED ORDER — KETOCONAZOLE 200 MG PO TABS
200.0000 mg | ORAL_TABLET | Freq: Every day | ORAL | Status: DC
  Administered 2013-04-25: 200 mg via ORAL
  Filled 2013-04-25 (×2): qty 1

## 2013-04-25 MED ORDER — IOHEXOL 300 MG/ML  SOLN
50.0000 mL | Freq: Once | INTRAMUSCULAR | Status: AC | PRN
  Administered 2013-04-25: 50 mL via ORAL

## 2013-04-25 MED ORDER — ONDANSETRON HCL 4 MG/2ML IJ SOLN
4.0000 mg | Freq: Once | INTRAMUSCULAR | Status: AC
  Administered 2013-04-25: 4 mg via INTRAVENOUS
  Filled 2013-04-25: qty 2

## 2013-04-25 MED ORDER — SODIUM BICARBONATE/SODIUM CHLORIDE MOUTHWASH
1.0000 "application " | Freq: Every day | OROMUCOSAL | Status: DC
  Administered 2013-04-25 – 2013-05-10 (×56): 1 via OROMUCOSAL
  Filled 2013-04-25: qty 1000

## 2013-04-25 MED ORDER — OXYCODONE HCL 5 MG PO TABS
10.0000 mg | ORAL_TABLET | ORAL | Status: DC | PRN
  Administered 2013-05-10: 10 mg via ORAL
  Filled 2013-04-25: qty 4

## 2013-04-25 MED ORDER — MORPHINE SULFATE 2 MG/ML IJ SOLN
2.0000 mg | INTRAMUSCULAR | Status: DC | PRN
  Administered 2013-04-26: 2 mg via INTRAVENOUS
  Filled 2013-04-25: qty 1

## 2013-04-25 MED ORDER — INSULIN ASPART 100 UNIT/ML ~~LOC~~ SOLN
0.0000 [IU] | Freq: Three times a day (TID) | SUBCUTANEOUS | Status: DC
  Administered 2013-04-25: 3 [IU] via SUBCUTANEOUS
  Administered 2013-04-25: 2 [IU] via SUBCUTANEOUS
  Administered 2013-04-27 (×2): 3 [IU] via SUBCUTANEOUS
  Administered 2013-04-27: 2 [IU] via SUBCUTANEOUS
  Administered 2013-04-28 – 2013-04-30 (×5): 3 [IU] via SUBCUTANEOUS

## 2013-04-25 MED ORDER — SODIUM CHLORIDE 0.9 % IV SOLN
INTRAVENOUS | Status: DC
  Administered 2013-04-25: 07:00:00 via INTRAVENOUS

## 2013-04-25 MED ORDER — LEVOFLOXACIN 500 MG PO TABS
500.0000 mg | ORAL_TABLET | Freq: Every day | ORAL | Status: DC
  Administered 2013-04-25: 500 mg via ORAL
  Filled 2013-04-25: qty 1

## 2013-04-25 MED ORDER — LORAZEPAM 0.5 MG PO TABS: 0.5000 mg | ORAL_TABLET | Freq: Four times a day (QID) | ORAL | Status: DC | PRN

## 2013-04-25 MED ORDER — IOHEXOL 300 MG/ML  SOLN
100.0000 mL | Freq: Once | INTRAMUSCULAR | Status: AC | PRN
  Administered 2013-04-25: 100 mL via INTRAVENOUS

## 2013-04-25 MED ORDER — PROCHLORPERAZINE MALEATE 10 MG PO TABS: 10.0000 mg | ORAL_TABLET | Freq: Four times a day (QID) | ORAL | Status: DC | PRN

## 2013-04-25 MED ORDER — ONDANSETRON HCL 4 MG PO TABS: 4.0000 mg | ORAL_TABLET | Freq: Four times a day (QID) | ORAL | Status: DC | PRN

## 2013-04-25 MED ORDER — TAMSULOSIN HCL 0.4 MG PO CAPS
0.4000 mg | ORAL_CAPSULE | Freq: Every day | ORAL | Status: DC | PRN
  Administered 2013-04-27: 0.4 mg via ORAL
  Filled 2013-04-25 (×2): qty 1

## 2013-04-25 NOTE — ED Notes (Signed)
Xray bedside to obtain abd xray

## 2013-04-25 NOTE — H&P (Signed)
[facility] History and Physical  Si Jachim MWN:[id number] DOB: 23-Aug-1945 DOA: 04/25/2013  Referring physician: Emergency department PCP: Mateo Flow, MD   Chief Complaint: Weakness  HPI: Keith Duffy is a 68 y.o. male  With a hx of DM and NHL s/p recent admission for chemo 4 days ago who presents to the ED with complaints of worsening generalized weakness and being unable to ambulate on his own. The patient also reported nausea with decreased appetite. In the Ed, the patient was noted to have dry mucus membranes. Renal function was normal. The patient was noted again to be unable to stand in the ED. The hospitalist service was consulted for admission.   Review of Systems:  Per above, the remainder of the 10pt ros reviewed and are neg  Past Medical History  Diagnosis Date  . Back pain   . Hypertension   . Diabetes mellitus without complication   . Cancer     non hodkins lymphoma   Past Surgical History  Procedure Laterality Date  . Chemo reservior insertion N/A 03/15/2013    Procedure: CHEMO RESERVOIR INSERTION;  Surgeon: Ophelia Charter, MD;  Location: [place] [facility];  Service: Neurosurgery;  Laterality: N/A;  right   Social History:  reports that he has never smoked. He has never used smokeless tobacco. He reports that he does not drink alcohol or use illicit drugs.  Allergies  Allergen Reactions  . Aspartame And Phenylalanine Diarrhea  . Codeine Other (See Comments)    "Made me go wild when I was young"    Family History  Problem Relation Age of Onset  . Heart attack Father   . Heart attack Maternal Grandfather      Prior to Admission medications   Medication Sig Start Date End Date Taking? Authorizing Provider  acetaminophen (TYLENOL) 500 MG tablet Take 1,000 mg by mouth daily as needed (pain).    Yes Historical Provider, MD  artificial tears (LACRILUBE) OINT ophthalmic ointment Place into the right eye every 8 (eight) hours. 03/20/13  Yes Volanda Napoleon, MD  chlorhexidine (PERIDEX) 0.12 % solution Use as directed 15 mLs in the mouth or throat 4 (four) times daily. 04/20/13  Yes Volanda Napoleon, MD  docusate sodium (COLACE) 100 MG capsule Take 100 mg by mouth daily.   Yes Historical Provider, MD  famciclovir (FAMVIR) 250 MG tablet Take 250 mg by mouth daily. To prevent shingles   Yes Historical Provider, MD  fentaNYL (DURAGESIC - DOSED MCG/HR) 12 MCG/HR Place 12.5 mcg onto the skin every 3 (three) days.   Yes Historical Provider, MD  insulin aspart (NOVOLOG) 100 UNIT/ML FlexPen Inject into the skin 3 (three) times daily with meals. CBG < 70: eat or drink something sweet & recheck blood sugar.    HOLD CBG 70 - 120: 0 units  CBG 121 - 150: 1 unit  CBG 151 - 200: 2 units  CBG 201 - 250: 3 units  CBG 251 - 300: 5 units  CBG 301 - 350: 7 units  CBG 351 - 400: 9 units  CBG > 400: call MD 08/28/12  Yes Modena Jansky, MD  Insulin Glargine (LANTUS SOLOSTAR) 100 UNIT/ML Solostar Pen Inject 25 Units into the skin at bedtime. HOLD   Yes Historical Provider, MD  ketoconazole (NIZORAL) 200 MG tablet Take 1 tablet (200 mg total) by mouth daily. 04/20/13  Yes Volanda Napoleon, MD  levofloxacin (LEVAQUIN) 500 MG tablet Take 1 tablet (500 mg total) by mouth daily.  03/20/13  Yes Volanda Napoleon, MD  LORazepam (ATIVAN) 0.5 MG tablet Take 1 tablet (0.5 mg total) by mouth every 6 (six) hours as needed (nausea and vomiting). 03/20/13  Yes Volanda Napoleon, MD  meloxicam (MOBIC) 15 MG tablet Take 1 tablet (15 mg total) by mouth every morning. 04/20/13  Yes Volanda Napoleon, MD  metoprolol tartrate (LOPRESSOR) 25 MG tablet Take 25 mg by mouth 2 (two) times daily. 1 tablet every morning and 1 tablet at lunch   Yes Historical Provider, MD  ondansetron (ZOFRAN) 8 MG tablet Take 1 tablet (8 mg total) by mouth 2 (two) times daily as needed (Nausea or vomiting). 03/13/13  Yes Volanda Napoleon, MD  Oxycodone HCl 10 MG TABS Take 10-20 mg by mouth every 4 (four) hours as needed  (breakthrough pain).   Yes Historical Provider, MD  prochlorperazine (COMPAZINE) 10 MG tablet Take 1 tablet (10 mg total) by mouth every 6 (six) hours as needed (Nausea or vomiting). 03/13/13  Yes Volanda Napoleon, MD  sodium bicarbonate/sodium chloride SOLN 1 application by Mouth Rinse route 6 (six) times daily. 03/20/13  Yes Volanda Napoleon, MD  tamsulosin (FLOMAX) 0.4 MG CAPS capsule Take 0.4 mg by mouth daily as needed (for urinary flow).  03/20/13  Yes Volanda Napoleon, MD  dexamethasone (DECADRON) 4 MG tablet Take 1 tablet (4 mg total) by mouth every 8 (eight) hours. 04/20/13   Volanda Napoleon, MD   Physical Exam: Filed Vitals:   04/25/13 0715  BP:   Pulse:   Temp:   Resp: 16    BP 131/78  Pulse 89  Temp(Src) 97.6 F (36.4 C) (Oral)  Resp 16  SpO2 98%  General:  Appears calm and comfortable Eyes: PERRL, normal lids, irises & conjunctiva ENT: grossly normal hearing, lips & tongue Neck: no LAD, masses or thyromegaly Cardiovascular: RRR, no m/r/g. No LE edema. Telemetry: SR, no arrhythmias  Respiratory: CTA bilaterally, no w/r/r. Normal respiratory effort. Abdomen: soft, tenderness throughout, worse over LLQ w/o rebound Skin: no rash or induration seen on limited exam, decreased turgor Musculoskeletal: grossly normal tone BUE/BLE Psychiatric: grossly normal mood and affect, speech fluent and appropriate Neurologic: grossly non-focal.          Labs on Admission:  Basic Metabolic Panel:  Recent Labs Lab 04/19/13 0825 04/20/13 0620 04/21/13 0944 04/25/13 0615 04/25/13 0630  NA 136* 138 137 134* 135*  K 3.5* 3.3* 3.5 3.6* 3.4*  CL 94* 98 98 98 99  CO2 31 33* 33 22  --   GLUCOSE 183* 68* 241* 231* 224*  BUN 21 17 22 23 23   CREATININE 0.81 0.78 0.9 0.72 0.80  CALCIUM 8.5 8.2* 8.7 8.0*  --    Liver Function Tests:  Recent Labs Lab 04/19/13 0825 04/20/13 0620 04/21/13 0944 04/25/13 0615  AST 17 15 16 12   ALT 22 20 23 15   ALKPHOS 69 57 67 65  BILITOT 0.6 0.5 0.80  1.3*  PROT 4.9* 4.2* 5.3* 4.9*  ALBUMIN 2.6* 2.3*  --  2.8*    Recent Labs Lab 04/25/13 0615  LIPASE 16   No results found for this basename: AMMONIA,  in the last 168 hours CBC:  Recent Labs Lab 04/19/13 0825 04/20/13 0620 04/21/13 0944 04/25/13 0615 04/25/13 0630  WBC 5.3 4.1 13.7* 0.2*  --   NEUTROABS  --  3.9 13.6*  --   --   HGB 10.0* 8.7* 10.0* 7.1* 9.2*  HCT 28.8* 24.1* 29.2* 20.1*  27.0*  MCV 92.3 91.3 95 92.2  --   PLT 151 122* 130* 43*  --    Cardiac Enzymes:  Recent Labs Lab 04/25/13 0615  TROPONINI <0.30    BNP (last 3 results) No results found for this basename: PROBNP,  in the last 8760 hours CBG:  Recent Labs Lab 04/19/13 1658 04/19/13 2139 04/20/13 0739 04/20/13 0812 04/25/13 0659  GLUCAP 181* 161* 63* 87 196*    Radiological Exams on Admission: Dg Chest Port 1 View  04/25/2013   CLINICAL DATA:  Weakness.  EXAM: PORTABLE CHEST - 1 VIEW  COMPARISON:  Chest x-ray 11/27/2012.  FINDINGS: Right internal jugular single-lumen power porta cath with tip terminating in the mid superior vena cava. Lung volumes are low. No consolidative airspace disease. No pleural effusions. No pneumothorax. No pulmonary nodule or mass noted. Pulmonary vasculature and the cardiomediastinal silhouette are within normal limits.  IMPRESSION: 1. Low lung volumes without radiographic evidence of acute cardiopulmonary disease.   Electronically Signed   By: Vinnie Langton M.D.   On: 04/25/2013 07:22    Assessment/Plan Principal Problem:   Generalized weakness Active Problems:   Metastatic cancer   DM (diabetes mellitus), type 2, uncontrolled   NHL (non-Hodgkin's lymphoma)   Pancytopenia   Dehydration   1. Generalized weakness 1. Will consult PT/OT 2. Cont with IVF for hydration for possible mild dehydration, suspect secondary to below 3. Admit to med-surg for observation 2. Suspected constipation 1. Hx of chronic pain meds without stimulant laxatives in the setting  of a primarily sedentary lifestyle 2. Pt reports hx of infrequent BM, last was 2-3 days ago 3. Per my own read, decreased bowel gas with mod amounts of stool noted 4. Consider a trial of enema 3. DM 1. Will cont home regimen 2. Cont with SSI coverage 3. Carb modified diet 4. NHL 1. Followed by Oncology 2. S/p recent chemo 4 days ago 5. Pancytopenia 1. Will monitor for now 2. Afebrile 3. Pt is s/p chemo 6. DVT prophylaxis 1. SCD's given plts of <100k  Code Status: Full (must indicate code status--if unknown or must be presumed, indicate so) Family Communication: Pt in room (indicate person spoken with, if applicable, with phone number if by telephone) Disposition Plan: Pending (indicate anticipated LOS)  Time spent: 49min  Jaelene Garciagarcia, [facility] Pager [phone]

## 2013-04-25 NOTE — ED Provider Notes (Signed)
CSN: [id number]     Arrival date & time 04/25/13  0557 History   First MD Initiated Contact with Patient 04/25/13 [phone]     No chief complaint on file.    (Consider location/radiation/quality/duration/timing/severity/associated sxs/prior Treatment) The history is provided by the spouse and the patient. No language interpreter was used.  Keith Duffy is a 68 y/o M with PMHx of Non-Hogdkin lymphoma, DM, HTN, back pain presenting to the ED with generalized weakness, nausea, diarrhea for the past 3 days. Wife reported that patient is a patient of [facility] in [facility], seen by  Dr. Pearletha Alfred. As per wife, reported that patient had 24 chemo administration Thursday into Friday of last week - reported that patient was seen by Oncologist this past Tuesday where another chemo treatment was administered. Reported that Wednesday patient was given Neulasta and 500 Units of IV fluids secondary to patient being dehydrated. Patient reported that he has been able to keep food or fluids down, but he has just been feeling so weak that he has had decreased appetite. Patient reported that he has been having chills. Wife reported that the nausea and emesis has gotten worse over the course of the past 4 hours. Stated that he has been having a mild headache the past couple of days. Patient unable to walk without assistance. Denied fever, chest pain, shortness of breath, difficulty breathing, abdominal pain, melena, hematochezia, urinary issues. PCP Dr. Humphrey Rolls Oncologist Dr. Pearletha Alfred  Past Medical History  Diagnosis Date  . Back pain   . Hypertension   . Diabetes mellitus without complication   . Cancer     non hodkins lymphoma   Past Surgical History  Procedure Laterality Date  . Chemo reservior insertion N/A 03/15/2013    Procedure: CHEMO RESERVOIR INSERTION;  Surgeon: Ophelia Charter, MD;  Location: [place] [facility];  Service: Neurosurgery;  Laterality: N/A;  right   Family History  Problem Relation Age of  Onset  . Heart attack Father   . Heart attack Maternal Grandfather    History  Substance Use Topics  . Smoking status: Never Smoker   . Smokeless tobacco: Never Used     Comment: never smoked  . Alcohol Use: No    Review of Systems  Constitutional: Positive for chills. Negative for fever.  Respiratory: Negative for chest tightness and shortness of breath.   Cardiovascular: Negative for chest pain.  Gastrointestinal: Positive for nausea, vomiting and diarrhea. Negative for abdominal pain, constipation, blood in stool and anal bleeding.  Genitourinary: Negative for dysuria, hematuria and decreased urine volume.  Neurological: Positive for weakness and headaches. Negative for dizziness and numbness.  All other systems reviewed and are negative.      Allergies  Aspartame and phenylalanine and Codeine  Home Medications   Current Outpatient Rx  Name  Route  Sig  Dispense  Refill  . acetaminophen (TYLENOL) 500 MG tablet   Oral   Take 1,000 mg by mouth daily as needed (pain).          Marland Kitchen artificial tears (LACRILUBE) OINT ophthalmic ointment   Right Eye   Place into the right eye every 8 (eight) hours.   1 Tube   4   . chlorhexidine (PERIDEX) 0.12 % solution   Mouth/Throat   Use as directed 15 mLs in the mouth or throat 4 (four) times daily.   120 mL   0   . docusate sodium (COLACE) 100 MG capsule   Oral   Take  100 mg by mouth daily.         . famciclovir (FAMVIR) 250 MG tablet   Oral   Take 250 mg by mouth daily. To prevent shingles         . fentaNYL (DURAGESIC - DOSED MCG/HR) 12 MCG/HR   Transdermal   Place 12.5 mcg onto the skin every 3 (three) days.         . insulin aspart (NOVOLOG) 100 UNIT/ML FlexPen   Subcutaneous   Inject into the skin 3 (three) times daily with meals. CBG < 70: eat or drink something sweet & recheck blood sugar.    HOLD CBG 70 - 120: 0 units  CBG 121 - 150: 1 unit  CBG 151 - 200: 2 units  CBG 201 - 250: 3 units  CBG 251 -  300: 5 units  CBG 301 - 350: 7 units  CBG 351 - 400: 9 units  CBG > 400: call MD         . Insulin Glargine (LANTUS SOLOSTAR) 100 UNIT/ML Solostar Pen   Subcutaneous   Inject 25 Units into the skin at bedtime. HOLD         . ketoconazole (NIZORAL) 200 MG tablet   Oral   Take 1 tablet (200 mg total) by mouth daily.   30 tablet   4   . levofloxacin (LEVAQUIN) 500 MG tablet   Oral   Take 1 tablet (500 mg total) by mouth daily.   14 tablet   4   . LORazepam (ATIVAN) 0.5 MG tablet   Oral   Take 1 tablet (0.5 mg total) by mouth every 6 (six) hours as needed (nausea and vomiting).   60 tablet   0   . meloxicam (MOBIC) 15 MG tablet   Oral   Take 1 tablet (15 mg total) by mouth every morning.   30 tablet   6   . metoprolol tartrate (LOPRESSOR) 25 MG tablet   Oral   Take 25 mg by mouth 2 (two) times daily. 1 tablet every morning and 1 tablet at lunch         . ondansetron (ZOFRAN) 8 MG tablet   Oral   Take 1 tablet (8 mg total) by mouth 2 (two) times daily as needed (Nausea or vomiting).   30 tablet   1   . Oxycodone HCl 10 MG TABS   Oral   Take 10-20 mg by mouth every 4 (four) hours as needed (breakthrough pain).         . prochlorperazine (COMPAZINE) 10 MG tablet   Oral   Take 1 tablet (10 mg total) by mouth every 6 (six) hours as needed (Nausea or vomiting).   30 tablet   1   . sodium bicarbonate/sodium chloride SOLN   Mouth Rinse   1 application by Mouth Rinse route 6 (six) times daily.   1000 mL   4   . tamsulosin (FLOMAX) 0.4 MG CAPS capsule   Oral   Take 0.4 mg by mouth daily as needed (for urinary flow).          Marland Kitchen dexamethasone (DECADRON) 4 MG tablet   Oral   Take 1 tablet (4 mg total) by mouth every 8 (eight) hours.   30 tablet   2    BP 131/78  Pulse 89  Temp(Src) 97.6 F (36.4 C) (Oral)  Resp 16  SpO2 98% Physical Exam  Nursing note and vitals reviewed. Constitutional: He is oriented to person,  place, and time. He appears  well-developed and well-nourished. No distress.  HENT:  Head: Normocephalic and atraumatic.  Mouth/Throat: No oropharyngeal exudate.  Dry mucus membranes  Eyes: Conjunctivae and EOM are normal. Pupils are equal, round, and reactive to light.  Neck: Normal range of motion. Neck supple. No tracheal deviation present.  Negative neck stiffness Negative nuchal rigidity Negative pain upon palpation to the c-spine  Cardiovascular: Regular rhythm and normal heart sounds.  Tachycardia present.  Exam reveals no friction rub.   No murmur heard. Pulses:      Radial pulses are 2+ on the right side, and 2+ on the left side.       Dorsalis pedis pulses are 2+ on the right side, and 2+ on the left side.  Tachycardia upon auscultation  Pulmonary/Chest: Effort normal and breath sounds normal. No respiratory distress. He has no wheezes. He has no rales.  Musculoskeletal: Normal range of motion.  Full ROM to upper and lower extremities without difficulty noted, negative ataxia noted.  Lymphadenopathy:    He has no cervical adenopathy.  Neurological: He is alert and oriented to person, place, and time. No cranial nerve deficit. He exhibits normal muscle tone. Coordination normal.  Cranial nerves III-XII grossly intact Strength 5+/5+ to upper and lower extremities bilaterally with resistance applied, equal distribution noted Equal grip strength noted Drooping noted to the left eye - been there for a couple of months  Skin: Skin is warm and dry. No rash noted. He is not diaphoretic. No erythema. There is pallor.  Psychiatric: He has a normal mood and affect. His behavior is normal. Thought content normal.    ED Course  Procedures (including critical care time)  7:49 AM This provider spoke with Dr. Rhetta Mura, [facility] - discussed case, history, presentation, labs, imaging. Physician recommended abd dg plain film one view that was ordered. Patient to be admitted for dehydration and decreased Hgb and  Hct - as per physician, recommended MedSurg observation.   Results for orders placed during the hospital encounter of 04/25/13  CBC      Result Value Ref Range   WBC 0.2 (*) 4.0 - 10.5 K/uL   RBC 2.18 (*) 4.22 - 5.81 MIL/uL   Hemoglobin 7.1 (*) 13.0 - 17.0 g/dL   HCT 20.1 (*) 39.0 - 52.0 %   MCV 92.2  78.0 - 100.0 fL   MCH 32.6  26.0 - 34.0 pg   MCHC 35.3  30.0 - 36.0 g/dL   RDW 19.6 (*) 11.5 - 15.5 %   Platelets 43 (*) 150 - 400 K/uL  COMPREHENSIVE METABOLIC PANEL      Result Value Ref Range   Sodium 134 (*) 137 - 147 mEq/L   Potassium 3.6 (*) 3.7 - 5.3 mEq/L   Chloride 98  96 - 112 mEq/L   CO2 22  19 - 32 mEq/L   Glucose, Bld 231 (*) 70 - 99 mg/dL   BUN 23  6 - 23 mg/dL   Creatinine, Ser 0.72  0.50 - 1.35 mg/dL   Calcium 8.0 (*) 8.4 - 10.5 mg/dL   Total Protein 4.9 (*) 6.0 - 8.3 g/dL   Albumin 2.8 (*) 3.5 - 5.2 g/dL   AST 12  0 - 37 U/L   ALT 15  0 - 53 U/L   Alkaline Phosphatase 65  39 - 117 U/L   Total Bilirubin 1.3 (*) 0.3 - 1.2 mg/dL   GFR calc non Af Amer >90  >90 mL/min  GFR calc Af Amer >90  >90 mL/min  URINALYSIS, ROUTINE W REFLEX MICROSCOPIC      Result Value Ref Range   Color, Urine YELLOW  YELLOW   APPearance CLEAR  CLEAR   Specific Gravity, Urine 1.018  1.005 - 1.030   pH 7.5  5.0 - 8.0   Glucose, UA 500 (*) NEGATIVE mg/dL   Hgb urine dipstick NEGATIVE  NEGATIVE   Bilirubin Urine NEGATIVE  NEGATIVE   Ketones, ur NEGATIVE  NEGATIVE mg/dL   Protein, ur NEGATIVE  NEGATIVE mg/dL   Urobilinogen, UA 0.2  0.0 - 1.0 mg/dL   Nitrite NEGATIVE  NEGATIVE   Leukocytes, UA NEGATIVE  NEGATIVE  TROPONIN I      Result Value Ref Range   Troponin I <0.30  <0.30 ng/mL  LIPASE, BLOOD      Result Value Ref Range   Lipase 16  11 - 59 U/L  I-STAT CG4 LACTIC ACID, ED      Result Value Ref Range   Lactic Acid, Venous 2.56 (*) 0.5 - 2.2 mmol/L  I-STAT CHEM 8, ED      Result Value Ref Range   Sodium 135 (*) 137 - 147 mEq/L   Potassium 3.4 (*) 3.7 - 5.3 mEq/L   Chloride  99  96 - 112 mEq/L   BUN 23  6 - 23 mg/dL   Creatinine, Ser 0.80  0.50 - 1.35 mg/dL   Glucose, Bld 224 (*) 70 - 99 mg/dL   Calcium, Ion 1.12 (*) 1.13 - 1.30 mmol/L   TCO2 23  0 - 100 mmol/L   Hemoglobin 9.2 (*) 13.0 - 17.0 g/dL   HCT 27.0 (*) 39.0 - 52.0 %  CBG MONITORING, ED      Result Value Ref Range   Glucose-Capillary 196 (*) 70 - 99 mg/dL   Comment 1 Notify RN      Dg Chest Port 1 View  04/25/2013   CLINICAL DATA:  Weakness.  EXAM: PORTABLE CHEST - 1 VIEW  COMPARISON:  Chest x-ray 11/27/2012.  FINDINGS: Right internal jugular single-lumen power porta cath with tip terminating in the mid superior vena cava. Lung volumes are low. No consolidative airspace disease. No pleural effusions. No pneumothorax. No pulmonary nodule or mass noted. Pulmonary vasculature and the cardiomediastinal silhouette are within normal limits.  IMPRESSION: 1. Low lung volumes without radiographic evidence of acute cardiopulmonary disease.   Electronically Signed   By: Vinnie Langton M.D.   On: 04/25/2013 07:22   Labs Review Labs Reviewed  CBC - Abnormal; Notable for the following:    WBC 0.2 (*)    RBC 2.18 (*)    Hemoglobin 7.1 (*)    HCT 20.1 (*)    RDW 19.6 (*)    Platelets 43 (*)    All other components within normal limits  COMPREHENSIVE METABOLIC PANEL - Abnormal; Notable for the following:    Sodium 134 (*)    Potassium 3.6 (*)    Glucose, Bld 231 (*)    Calcium 8.0 (*)    Total Protein 4.9 (*)    Albumin 2.8 (*)    Total Bilirubin 1.3 (*)    All other components within normal limits  URINALYSIS, ROUTINE W REFLEX MICROSCOPIC - Abnormal; Notable for the following:    Glucose, UA 500 (*)    All other components within normal limits  I-STAT CG4 LACTIC ACID, ED - Abnormal; Notable for the following:    Lactic Acid, Venous 2.56 (*)  All other components within normal limits  I-STAT CHEM 8, ED - Abnormal; Notable for the following:    Sodium 135 (*)    Potassium 3.4 (*)    Glucose, Bld  224 (*)    Calcium, Ion 1.12 (*)    Hemoglobin 9.2 (*)    HCT 27.0 (*)    All other components within normal limits  CBG MONITORING, ED - Abnormal; Notable for the following:    Glucose-Capillary 196 (*)    All other components within normal limits  TROPONIN I  LIPASE, BLOOD  TYPE AND SCREEN   Imaging Review Dg Chest Port 1 View  04/25/2013   CLINICAL DATA:  Weakness.  EXAM: PORTABLE CHEST - 1 VIEW  COMPARISON:  Chest x-ray 11/27/2012.  FINDINGS: Right internal jugular single-lumen power porta cath with tip terminating in the mid superior vena cava. Lung volumes are low. No consolidative airspace disease. No pleural effusions. No pneumothorax. No pulmonary nodule or mass noted. Pulmonary vasculature and the cardiomediastinal silhouette are within normal limits.  IMPRESSION: 1. Low lung volumes without radiographic evidence of acute cardiopulmonary disease.   Electronically Signed   By: Vinnie Langton M.D.   On: 04/25/2013 07:22     EKG Interpretation None      MDM   Final diagnoses:  Dehydration  Nausea and vomiting  Non Hodgkin's lymphoma   Medications  0.9 %  sodium chloride infusion ( Intravenous New Bag/Given 04/25/13 0641)  ondansetron (ZOFRAN) injection 4 mg (4 mg Intravenous Given 04/25/13 0640)   Filed Vitals:   04/25/13 0630 04/25/13 0645 04/25/13 0700 04/25/13 0715  BP:      Pulse: 111 108 89   Temp:      TempSrc:      Resp:  17 16 16   SpO2: 100% 99% 98%     Patient presenting to the ED, BIB EMS, due to generalized weakness, nausea, vomiting, diarrhea that has been ongoing for the past 3 days. Patient has history of Non-Hogdkin's lymphoma and is currently on chemotherapy with a 24 hour administration Thursday into Friday of last week, another treatment this past Tuesday and Neulasta Wednesday with IV fluids secondary to dehydration.  Alert and oriented. GCS 15. Pale. Dry mucus membranes noted. Heart rate noted to be tachycardic upon auscultation with normal rhythm.  Lungs clear to auscultation to upper and lower lobes bilaterally. Radial and DP pulses 2+ bilaterally. Cap refill < 3 seconds. Mild swelling noted to the lower extremities - mainly ankles - with negative signs of pitting edema. Equal grip strength noted. Strength intact with equal distribution. Full ROM to upper and lower extremities bilaterally without difficulty noted.  EKG noted sinus tachycardia with a heart rate of 105 bpm. Troponin negative elevation. CBC noted WBC of 0.2 with a RBC count of 2.18. Hgb 7.1 and Hct 20.1. CMP mildly low with sodium of 134 and potassium of 3.6. Glucose 231. Anion gap of 14.0 mEq/L. Lactic acid of 2.56. UA negative for infection - negative nitrites or leukocytes. Negative ketones. Chest xray negative for acute cardiopulmonary disease.   Doubt DKA. IV fluids administered and patient's heart rate has decreased from 112 to 89 bpm. Patient presenting to the ED with dehydration, drop in Hgb and Hct - type and screen ordered. Decreased WBC of 0.2 noted when compared to 4 days ago WBC was 13.7. Discussed case with Dr. Rhetta Mura who recommended patient to be admitted to observation as MedSurg floor. Discussed plan of admission with patient. Discussed with patient  and family plan for admission. Patient and family agreed to plan. Patient stable for transfer.    Jamse Mead, PA-C 04/26/13 1229

## 2013-04-25 NOTE — ED Notes (Signed)
Pt arrived to the ED with a complaint of weakness.  Pt has lymphoma and has been having treatment recently   After his Thursday treatment pt began feeling more weak than normal.  Pt is unable to stand without assistance.

## 2013-04-25 NOTE — Progress Notes (Signed)
Utilization Review Completed.   Ajanae Virag, RN, BSN Nurse Case Manager  

## 2013-04-25 NOTE — ED Notes (Signed)
Xray bedside to perform portable chest

## 2013-04-26 DIAGNOSIS — C7949 Secondary malignant neoplasm of other parts of nervous system: Secondary | ICD-10-CM

## 2013-04-26 DIAGNOSIS — D638 Anemia in other chronic diseases classified elsewhere: Secondary | ICD-10-CM

## 2013-04-26 DIAGNOSIS — C8589 Other specified types of non-Hodgkin lymphoma, extranodal and solid organ sites: Secondary | ICD-10-CM

## 2013-04-26 DIAGNOSIS — C801 Malignant (primary) neoplasm, unspecified: Secondary | ICD-10-CM

## 2013-04-26 DIAGNOSIS — R509 Fever, unspecified: Secondary | ICD-10-CM

## 2013-04-26 LAB — GLUCOSE, CAPILLARY
GLUCOSE-CAPILLARY: 165 mg/dL — AB (ref 70–99)
GLUCOSE-CAPILLARY: 169 mg/dL — AB (ref 70–99)
Glucose-Capillary: 158 mg/dL — ABNORMAL HIGH (ref 70–99)
Glucose-Capillary: 170 mg/dL — ABNORMAL HIGH (ref 70–99)

## 2013-04-26 LAB — URINALYSIS W MICROSCOPIC (NOT AT ARMC)
BILIRUBIN URINE: NEGATIVE
Glucose, UA: 500 mg/dL — AB
Hgb urine dipstick: NEGATIVE
Ketones, ur: NEGATIVE mg/dL
Leukocytes, UA: NEGATIVE
NITRITE: NEGATIVE
PH: 6.5 (ref 5.0–8.0)
Protein, ur: NEGATIVE mg/dL
SPECIFIC GRAVITY, URINE: 1.017 (ref 1.005–1.030)
UROBILINOGEN UA: 0.2 mg/dL (ref 0.0–1.0)

## 2013-04-26 LAB — COMPREHENSIVE METABOLIC PANEL
ALT: 12 U/L (ref 0–53)
AST: 11 U/L (ref 0–37)
Albumin: 2.3 g/dL — ABNORMAL LOW (ref 3.5–5.2)
Alkaline Phosphatase: 53 U/L (ref 39–117)
BUN: 18 mg/dL (ref 6–23)
CO2: 22 meq/L (ref 19–32)
Calcium: 7.5 mg/dL — ABNORMAL LOW (ref 8.4–10.5)
Chloride: 99 mEq/L (ref 96–112)
Creatinine, Ser: 0.7 mg/dL (ref 0.50–1.35)
GFR calc Af Amer: >90 mL/min (ref >90)
Glucose, Bld: 159 mg/dL — ABNORMAL HIGH (ref 70–99)
Potassium: 3.1 mEq/L — ABNORMAL LOW (ref 3.7–5.3)
SODIUM: 134 meq/L — AB (ref 137–147)
TOTAL PROTEIN: 4.2 g/dL — AB (ref 6.0–8.3)
Total Bilirubin: 1.3 mg/dL — ABNORMAL HIGH (ref 0.3–1.2)

## 2013-04-26 LAB — CBC
HEMATOCRIT: 17.9 % — AB (ref 39.0–52.0)
HEMOGLOBIN: 6.5 g/dL — AB (ref 13.0–17.0)
MCH: 33.2 pg (ref 26.0–34.0)
MCHC: 36.3 g/dL — AB (ref 30.0–36.0)
MCV: 91.3 fL (ref 78.0–100.0)
Platelets: 23 10*3/uL — CL (ref 150–400)
RBC: 1.96 MIL/uL — ABNORMAL LOW (ref 4.22–5.81)
RDW: 19.2 % — ABNORMAL HIGH (ref 11.5–15.5)
WBC: 0.1 10*3/uL — CL (ref 4.0–10.5)

## 2013-04-26 LAB — PREPARE RBC (CROSSMATCH)

## 2013-04-26 MED ORDER — PANTOPRAZOLE SODIUM 40 MG IV SOLR
40.0000 mg | Freq: Two times a day (BID) | INTRAVENOUS | Status: DC
  Administered 2013-04-26 – 2013-05-05 (×20): 40 mg via INTRAVENOUS
  Filled 2013-04-26 (×22): qty 40

## 2013-04-26 MED ORDER — POTASSIUM CHLORIDE 10 MEQ/50ML IV SOLN
10.0000 meq | INTRAVENOUS | Status: AC
  Administered 2013-04-26 (×4): 10 meq via INTRAVENOUS
  Filled 2013-04-26 (×4): qty 50

## 2013-04-26 MED ORDER — ONDANSETRON 8 MG/NS 50 ML IVPB
8.0000 mg | Freq: Four times a day (QID) | INTRAVENOUS | Status: DC | PRN
  Filled 2013-04-26 (×2): qty 8

## 2013-04-26 MED ORDER — VITAMINS A & D EX OINT
TOPICAL_OINTMENT | CUTANEOUS | Status: AC
  Filled 2013-04-26: qty 5

## 2013-04-26 MED ORDER — BOOST / RESOURCE BREEZE PO LIQD
1.0000 | Freq: Two times a day (BID) | ORAL | Status: DC
  Administered 2013-04-27: 1 via ORAL

## 2013-04-26 MED ORDER — METHYLPHENIDATE HCL 5 MG PO TABS
5.0000 mg | ORAL_TABLET | Freq: Three times a day (TID) | ORAL | Status: DC
  Administered 2013-04-26 – 2013-05-09 (×36): 5 mg via ORAL
  Filled 2013-04-26 (×36): qty 1

## 2013-04-26 MED ORDER — ONDANSETRON HCL 4 MG PO TABS: 4.0000 mg | ORAL_TABLET | Freq: Four times a day (QID) | ORAL | Status: DC | PRN

## 2013-04-26 NOTE — Progress Notes (Signed)
Pt's temperature now 100.8.  Otherwise patient appears stable.  Dr. Charlies Silvers notified and will give Tylenol.  Zandra Abts [facility]  04/26/2013  5:59 PM

## 2013-04-26 NOTE — Progress Notes (Signed)
Patient's temperature prior to starting blood transfusion was 97.6.  Blood transfusion was started at 1600.  At 1615, patient's temperature was up to 99.9.  This was double checked for accuracy.  Blood transfusion stopped and reaction protocol started.  Lab notified and Dr. Charlies Silvers notified.  Labs were obtained per protocol and also urine specimen will be sent.  After labs are resulted, the blood bank technicians will speak to the medical director to see if okay to pursue another transfusion.  Zandra Abts [facility]  04/26/2013  5:05 PM

## 2013-04-26 NOTE — Progress Notes (Signed)
CRITICAL VALUE ALERT  Critical value received: hgb6.5; plt ct 23  Date of notification: 04/23/13  Time of notification:  8592  Critical value read back:yes  Nurse who received alert:  D.Ouida Sills RN  MD notified (1st page): K Schorr  Time of first page:  [phone]  MD notified (2nd page):  Time of second page:  Responding MD:  k schorr  Time MD responded:  0600  See new orders

## 2013-04-26 NOTE — Consult Note (Signed)
#  [id number] is consult note. Keith E.  Romans 5:3-5

## 2013-04-26 NOTE — Progress Notes (Signed)
PT Cancellation Note  Patient Details Name: Keith Duffy MRN: [id number] DOB: 09-Jul-1945   Cancelled Treatment:    Reason Eval/Treat Not Completed: Medical issues which prohibited therapy--Hgb 6.5. will hold PT until after transfusion(s). will check back another time. thaks.    Weston Anna, MPT Pager: [phone]

## 2013-04-26 NOTE — Progress Notes (Signed)
OT Cancellation Note  Patient Details Name: Keith Duffy MRN: [id number] DOB: [birth year]   Cancelled Treatment:    Reason Eval/Treat Not Completed: Medical issues which prohibited therapy Medical issues which prohibited therapy--Hgb 6.5. will hold OT until after transfusion(s). will check back another time   Betsy Pries 04/26/2013, 11:41 AM

## 2013-04-26 NOTE — Progress Notes (Signed)
UR completed. Patient changed to inpatient- requiring IV antibiotics, IV flagyl and IVF @ 125cc/hr 

## 2013-04-26 NOTE — ED Provider Notes (Signed)
Medical screening examination/treatment/procedure(s) were performed by non-physician practitioner and as supervising physician I was immediately available for consultation/collaboration.   EKG Interpretation None       Teressa Lower, MD 04/26/13 2302

## 2013-04-26 NOTE — Progress Notes (Signed)
INITIAL NUTRITION ASSESSMENT  DOCUMENTATION CODES Per approved criteria  -Severe malnutrition in the context of chronic illness or injury  Pt meets criteria for severe MALNUTRITION in the context of chronic illness as evidenced by PO intake <75% est nutrition needs for > one month, severe muscle wasting and moderate subcutaneous fat loss.   INTERVENTION: Recommend Resource Breeze po BID, each supplement provides 250 kcal and 9 grams of protein Recommend modifying to chocolate Ensure BID as diet advancement tolerated Will continue to monitor  NUTRITION DIAGNOSIS: Inadequate oral intake related to abd pain/nausea as evidenced by PO intake <75% for 7 days.   Goal: Pt to meet >/= 90% of their estimated nutrition needs    Monitor:  Total protein/energy intake, labs, weights GI profile, diet advancement  Reason for Assessment: MST  68 y.o. male  Admitting Dx: Generalized weakness  ASSESSMENT: With a hx of DM and NHL s/p recent admission for chemo 4 days ago who presents to the ED with complaints of worsening generalized weakness and being unable to ambulate on his own. The patient also reported nausea with decreased appetite. In the Ed, the patient was noted to have dry mucus membranes. Renal function was normal. The patient was noted again to be unable to stand in the ED.   -Pt reported minimal intake for past 7 days d/t abd pain. Unable to tolerate any solids or liquids. Confirmed minimal appetite prior to last week period. PO intake ranged from 25-100% during previous admit in 2/28 -Received chemo treatments 4 days ago -Was drinking chocolate Ensure BID but has been unable to to tolerate -Pt reported usual body weight around 155 lbs. Felt that he has lost weight in past week from minimal PO intake. Current weight may not be accurate d/t edemas -On clear liquid diet. Pt with 0% PO intake. Tried to eat broth and jello but this caused more nausea and abd pain -Was willing to try  Resource Breeze BID to assist in meeting est protein needs -Pt DM, but is taking minimal CHO from food sources; therefore Resource Breeze still warranted and appropriate intervention  -Will modify supplement as diet advances -Pt cannot tolerate aspartame. Causes loose stools per wife -Nutrition Focused Physical Exam:  Subcutaneous Fat:  Orbital Region: moderate Upper Arm Region: moderate Thoracic and Lumbar Region: n/a  Muscle:  Temple Region: WDL Clavicle Bone Region: severe Clavicle and Acromion Bone Region: moderate Scapular Bone Region: n/a Dorsal Hand: WDL Patellar Region: n/a Anterior Thigh Region: n/a Posterior Calf Region: n/a  Edema: +1 RLE and +1 LLE     Height: Ht Readings from Last 1 Encounters:  04/25/13 5\' 9"  (1.753 m)    Weight: Wt Readings from Last 1 Encounters:  04/25/13 168 lb 14.4 oz (76.613 kg)    Ideal Body Weight: 160 lbs  % Ideal Body Weight: 168%  Wt Readings from Last 10 Encounters:  04/25/13 168 lb 14.4 oz (76.613 kg)  04/14/13 155 lb (70.308 kg)  04/07/13 155 lb (70.308 kg)  03/30/13 154 lb (69.854 kg)  03/18/13 161 lb 13.1 oz (73.4 kg)  03/18/13 161 lb 13.1 oz (73.4 kg)  03/09/13 161 lb (73.029 kg)  03/05/13 168 lb (76.204 kg)  02/08/13 168 lb (76.204 kg)  01/18/13 173 lb (78.472 kg)    Usual Body Weight: 155 lbs   % Usual Body Weight: 108%  BMI:  Body mass index is 24.93 kg/(m^2).  Estimated Nutritional Needs: (usual body weight used) Kcal: 2100-2300 Protein: 85-100 gram Fluid: >/=2100 ml/daily  Skin: WDL  Diet Order: Clear Liquid  EDUCATION NEEDS: -No education needs identified at this time   Intake/Output Summary (Last 24 hours) at 04/26/13 1515 Last data filed at 04/26/13 1300  Gross per 24 hour  Intake   2435 ml  Output   1175 ml  Net   1260 ml    Last BM: 3/07   Labs:   Recent Labs Lab 04/21/13 0944 04/25/13 0615 04/25/13 0630 04/26/13 0445  NA 137 134* 135* 134*  K 3.5 3.6* 3.4* 3.1*  CL  98 98 99 99  CO2 33 22  --  22  BUN 22 23 23 18   CREATININE 0.9 0.72 0.80 0.70  CALCIUM 8.7 8.0*  --  7.5*  GLUCOSE 241* 231* 224* 159*    CBG (last 3)   Recent Labs  04/25/13 2055 04/26/13 0828 04/26/13 1208  GLUCAP 137* 165* 169*    Scheduled Meds: . artificial tears   Right Eye 3 times per day  . ciprofloxacin  400 mg Intravenous Q12H  . docusate sodium  100 mg Oral BID  . famciclovir  250 mg Oral Daily  . feeding supplement (RESOURCE BREEZE)  1 Container Oral BID BM  . [START ON 04/27/2013] fentaNYL  12.5 mcg Transdermal Q72H  . insulin aspart  0-15 Units Subcutaneous TID WC  . insulin aspart  0-5 Units Subcutaneous QHS  . insulin glargine  25 Units Subcutaneous QHS  . methylphenidate  5 mg Oral TID WC  . metoprolol tartrate  25 mg Oral BID  . metronidazole  500 mg Intravenous Q8H  . pantoprazole (PROTONIX) IV  40 mg Intravenous Q12H  . potassium chloride  10 mEq Intravenous Q1 Hr x 4  . sodium bicarbonate/sodium chloride  1 application Mouth Rinse 6 X Daily  . vitamin A & D        Continuous Infusions: . sodium chloride 100 mL/hr at 04/25/13 1831    Past Medical History  Diagnosis Date  . Back pain   . Hypertension   . Diabetes mellitus without complication   . Cancer     non hodkins lymphoma    Past Surgical History  Procedure Laterality Date  . Chemo reservior insertion N/A 03/15/2013    Procedure: CHEMO RESERVOIR INSERTION;  Surgeon: Ophelia Charter, MD;  Location: [facility];  Service: Neurosurgery;  Laterality: N/A;  right    [place] Clinical Dietitian [phone]

## 2013-04-26 NOTE — Progress Notes (Signed)
[facility] PROGRESS NOTE  Keith Duffy TSV:[id number] DOB: 29-Nov-1945 DOA: 04/25/2013 PCP: Mateo Flow, MD  Brief narrative: 68 y.o. male with past medical history of non-Hodgkin's lymphoma, status post recent admission for chemotherapy, diabetes who presented to [facility] ED 04/25/2013 with worsening generalized weakness, fatigue, poor appetite. Patient was subsequently found to have diffuse wall thickening in the colon compatible with pancolitis, probably infectious. In addition, blood work on admission revealed white blood cell count of 0.2, hemoglobin 7.1, platelets 43, sodium 134 and potassium 3.6. Chest x-ray showed low lung volumes without evidence of acute cardiopulmonary process. Abdominal x-ray showed nonspecific bowel gas pattern.  Assessment/Plan:  Principal Problem:   Generalized weakness - Likely multifactorial secondary to non-Hodgkin's lymphoma, effects of chemotherapy, colitis - Obtain physical therapy evaluation once patient able to participate - Continue Ritalin 5 mg by mouth 3 times a day Active Problems:   Colitis - Continue ciprofloxacin and Flagyl - Continue IV fluids, normal saline at 100 cc an hour - Continue Protonix 40 mg IV every 12 hours   DM (diabetes mellitus), type 2, with complications of neuropathy - Continue NovoLog sliding scale, continue Lantus 25 units at bedtime - Check A1c in the morning with the rest of the blood work   NHL (non-Hodgkin's lymphoma) - Management per oncology   Pancytopenia - Likely secondary to non-Hodgkin's lymphoma and sequela of chemotherapy - In regards to neutropenia, patient has received a lot of support previously. Continue to watch white blood cells - In regards to anemia, patient will receive one unit of PRBC blood transfusion - In regards to platelets, continue to monitor platelet   Severe protein calorie malnutrition - Nutrition consulted - continue feeding supplements   Code Status: full code Family Communication:  family at the bedside Disposition Plan: home when stable   Leisa Lenz, MD  [facility] Pager [phone]  If 7PM-7AM, please contact night-coverage [email or web address] Password [email or web address] 04/26/2013, 1:43 PM   LOS: 1 day   Consultants:  Oncology (Dr. Marin Olp)   Procedures:  None   Antibiotics:  Cipro 04/25/2013 -->  Flagyl 04/25/2013 -->  HPI/Subjective: Feels tired.   Objective: Filed Vitals:   04/25/13 0845 04/25/13 1500 04/25/13 2058 04/26/13 0555  BP: 132/73 149/71 127/75 141/74  Pulse: 106 96 98 94  Temp: 98.9 F (37.2 C) 99.1 F (37.3 C) 98.9 F (37.2 C) 98.8 F (37.1 C)  TempSrc: Oral Oral Oral Oral  Resp: 16 16 16 16   Height: 5\' 9"  (1.753 m)     Weight: 76.613 kg (168 lb 14.4 oz)     SpO2: 99% 100% 98% 99%    Intake/Output Summary (Last 24 hours) at 04/26/13 1343 Last data filed at 04/26/13 0700  Gross per 24 hour  Intake   2435 ml  Output    975 ml  Net   1460 ml    Exam:   General:  Pt is alert, follows commands appropriately, not in acute distress  Cardiovascular: Regular rate and rhythm, S1/S2 appreciated   Respiratory: Clear to auscultation bilaterally, no wheezing, no crackles, no rhonchi  Abdomen: Soft, non tender, non distended, bowel sounds present, no guarding  Extremities: ankle +1 pitting edema, pulses DP and PT palpable bilaterally  Neuro: Grossly nonfocal  Data Reviewed: Basic Metabolic Panel:  Recent Labs Lab 04/20/13 0620 04/21/13 0944 04/25/13 0615 04/25/13 0630 04/26/13 0445  NA 138 137 134* 135* 134*  K 3.3* 3.5 3.6* 3.4* 3.1*  CL 98 98 98 99 99  CO2 33* 33 22  --  22  GLUCOSE 68* 241* 231* 224* 159*  BUN 17 22 23 23 18   CREATININE 0.78 0.9 0.72 0.80 0.70  CALCIUM 8.2* 8.7 8.0*  --  7.5*   Liver Function Tests:  Recent Labs Lab 04/20/13 0620 04/21/13 0944 04/25/13 0615 04/26/13 0445  AST 15 16 12 11   ALT 20 23 15 12   ALKPHOS 57 67 65 53  BILITOT 0.5 0.80 1.3* 1.3*  PROT 4.2* 5.3* 4.9* 4.2*  ALBUMIN  2.3*  --  2.8* 2.3*    Recent Labs Lab 04/25/13 0615  LIPASE 16   No results found for this basename: AMMONIA,  in the last 168 hours CBC:  Recent Labs Lab 04/20/13 0620 04/21/13 0944 04/25/13 0615 04/25/13 0630 04/26/13 0445  WBC 4.1 13.7* 0.2*  --  0.1*  NEUTROABS 3.9 13.6*  --   --   --   HGB 8.7* 10.0* 7.1* 9.2* 6.5*  HCT 24.1* 29.2* 20.1* 27.0* 17.9*  MCV 91.3 95 92.2  --  91.3  PLT 122* 130* 43*  --  23*   Cardiac Enzymes:  Recent Labs Lab 04/25/13 0615  TROPONINI <0.30   BNP: No components found with this basename: POCBNP,  CBG:  Recent Labs Lab 04/25/13 1325 04/25/13 1743 04/25/13 2055 04/26/13 0828 04/26/13 1208  GLUCAP 157* 148* 137* 165* 169*    No results found for this or any previous visit (from the past 240 hour(s)).   Studies: Ct Abdomen Pelvis W Contrast 04/25/2013    IMPRESSION: 1. Diffuse wall thickening in the colon compatible with pancolitis, probably infectious. 2. Trace bilateral pleural effusions and trace ascites. 3. Borderline splenomegaly. 4. Continued marrow heterogeneity compatible with marrow infiltrative process. 5. Hypodense hepatic lesions favor a hemangiomas. Right right renal cyst. 6. Contrast medium in the distal esophagus, suspicious for a gastroesophageal reflux. 7. Left foraminal stenosis at L5-S1 due to facet arthropathy.     Dg Chest Port 1 View 04/25/2013    IMPRESSION: 1. Low lung volumes without radiographic evidence of acute cardiopulmonary disease.    Dg Abd Portable 1v 04/25/2013     IMPRESSION: Nonspecific bowel gas pattern.  Patchy sclerotic densities in the femoral heads. Findings are similar to the prior examination and consistent with history of lymphoma.   Electronically Signed   By: Markus Daft M.D.   On: 04/25/2013 08:51    Scheduled Meds: . ciprofloxacin  400 mg Intravenous Q12H  . docusate sodium  100 mg Oral BID  . famciclovir  250 mg Oral Daily  . feeding supplement   1 Container Oral BID BM  .   fentaNYL  12.5 mcg Transdermal Q72H  . insulin aspart  0-15 Units Subcutaneous TID WC  . insulin aspart  0-5 Units Subcutaneous QHS  . insulin glargine  25 Units Subcutaneous QHS  . methylphenidate  5 mg Oral TID WC  . metoprolol tartrate  25 mg Oral BID  . metronidazole  500 mg Intravenous Q8H  . pantoprazole   40 mg Intravenous Q12H  . sodium bicarbonate  1 application Mouth Rinse 6 X Daily  . vitamin A & D       Continuous Infusions: . sodium chloride 100 mL/hr at 04/25/13 1831

## 2013-04-27 DIAGNOSIS — R197 Diarrhea, unspecified: Secondary | ICD-10-CM

## 2013-04-27 DIAGNOSIS — R5081 Fever presenting with conditions classified elsewhere: Secondary | ICD-10-CM

## 2013-04-27 DIAGNOSIS — D709 Neutropenia, unspecified: Secondary | ICD-10-CM

## 2013-04-27 DIAGNOSIS — D649 Anemia, unspecified: Secondary | ICD-10-CM

## 2013-04-27 LAB — CBC WITH DIFFERENTIAL/PLATELET
Band Neutrophils: 0 % (ref 0–10)
Basophils Absolute: 0 10*3/uL (ref 0.0–0.1)
Basophils Relative: 0 % (ref 0–1)
EOS ABS: 0 10*3/uL (ref 0.0–0.7)
Eosinophils Relative: 0 % (ref 0–5)
HCT: 16.3 % — ABNORMAL LOW (ref 39.0–52.0)
Hemoglobin: 5.9 g/dL — CL (ref 13.0–17.0)
LYMPHS ABS: 0 10*3/uL — AB (ref 0.7–4.0)
LYMPHS PCT: 0 % — AB (ref 12–46)
MCH: 32.6 pg (ref 26.0–34.0)
MCHC: 36.2 g/dL — AB (ref 30.0–36.0)
MCV: 90.1 fL (ref 78.0–100.0)
MONO ABS: 0 10*3/uL — AB (ref 0.1–1.0)
Monocytes Relative: 0 % — ABNORMAL LOW (ref 3–12)
NEUTROS PCT: 0 % — AB (ref 43–77)
Platelets: 14 10*3/uL — CL (ref 150–400)
RBC: 1.81 MIL/uL — ABNORMAL LOW (ref 4.22–5.81)
RDW: 18.7 % — AB (ref 11.5–15.5)
WBC: <0.1 10*3/uL — CL (ref 4.0–10.5)
nRBC: 0 /100 WBC

## 2013-04-27 LAB — GLUCOSE, CAPILLARY
Glucose-Capillary: 191 mg/dL — ABNORMAL HIGH (ref 70–99)
Glucose-Capillary: 179 mg/dL — ABNORMAL HIGH (ref 70–99)
Glucose-Capillary: 128 mg/dL — ABNORMAL HIGH (ref 70–99)
Glucose-Capillary: 166 mg/dL — ABNORMAL HIGH (ref 70–99)

## 2013-04-27 LAB — COMPREHENSIVE METABOLIC PANEL
ALT: 9 U/L (ref 0–53)
AST: 10 U/L (ref 0–37)
Albumin: 2.3 g/dL — ABNORMAL LOW (ref 3.5–5.2)
Alkaline Phosphatase: 51 U/L (ref 39–117)
BUN: 14 mg/dL (ref 6–23)
CALCIUM: 7.5 mg/dL — AB (ref 8.4–10.5)
CO2: 20 meq/L (ref 19–32)
CREATININE: 0.64 mg/dL (ref 0.50–1.35)
Chloride: 100 mEq/L (ref 96–112)
GFR calc Af Amer: >90 mL/min (ref >90)
Glucose, Bld: 186 mg/dL — ABNORMAL HIGH (ref 70–99)
Potassium: 3 mEq/L — ABNORMAL LOW (ref 3.7–5.3)
SODIUM: 134 meq/L — AB (ref 137–147)
TOTAL PROTEIN: 4.3 g/dL — AB (ref 6.0–8.3)
Total Bilirubin: 1.3 mg/dL — ABNORMAL HIGH (ref 0.3–1.2)

## 2013-04-27 LAB — PREPARE RBC (CROSSMATCH)

## 2013-04-27 LAB — TRANSFUSION REACTION
DAT C3: NEGATIVE
POST RXN DAT IGG: NEGATIVE

## 2013-04-27 MED ORDER — DEXTROSE 5 % IV SOLN
5.0000 mg/kg | Freq: Three times a day (TID) | INTRAVENOUS | Status: DC
  Administered 2013-04-27 – 2013-05-04 (×21): 385 mg via INTRAVENOUS
  Filled 2013-04-27 (×25): qty 7.7

## 2013-04-27 MED ORDER — DIPHENHYDRAMINE HCL 25 MG PO CAPS
25.0000 mg | ORAL_CAPSULE | Freq: Four times a day (QID) | ORAL | Status: AC | PRN
  Administered 2013-04-27: 25 mg via ORAL
  Filled 2013-04-27: qty 1

## 2013-04-27 MED ORDER — ACETAMINOPHEN 325 MG PO TABS
650.0000 mg | ORAL_TABLET | Freq: Once | ORAL | Status: AC
  Administered 2013-04-27: 650 mg via ORAL
  Filled 2013-04-27: qty 2

## 2013-04-27 MED ORDER — FUROSEMIDE 10 MG/ML IJ SOLN
10.0000 mg | Freq: Once | INTRAMUSCULAR | Status: AC
  Administered 2013-04-27: 10 mg via INTRAVENOUS
  Filled 2013-04-27 (×2): qty 1

## 2013-04-27 MED ORDER — CHLORHEXIDINE GLUCONATE 0.12 % MT SOLN
10.0000 mL | Freq: Four times a day (QID) | OROMUCOSAL | Status: DC
  Administered 2013-04-27 – 2013-05-09 (×6): 10 mL via OROMUCOSAL
  Filled 2013-04-27 (×56): qty 15

## 2013-04-27 MED ORDER — POTASSIUM CHLORIDE 10 MEQ/50ML IV SOLN
10.0000 meq | INTRAVENOUS | Status: AC
  Administered 2013-04-27 (×6): 10 meq via INTRAVENOUS
  Filled 2013-04-27 (×6): qty 50

## 2013-04-27 MED ORDER — FILGRASTIM 480 MCG/1.6ML IJ SOLN
480.0000 ug | Freq: Every day | INTRAMUSCULAR | Status: DC
Start: 2013-04-27 — End: 2013-04-30
  Administered 2013-04-27 – 2013-04-29 (×3): 480 ug via SUBCUTANEOUS
  Filled 2013-04-27 (×4): qty 1.6

## 2013-04-27 NOTE — Progress Notes (Signed)
CRITICAL VALUE ALERT  Critical value received:  PLT 14,HGB5.9,WBC <0.1  Date of notification:  04/27/13  Time of notification:  0615  Critical value read back:YES  Nurse who received alert:  Lottie Dawson  MD notified (1st page):  [name]  Time of first page:  0620  MD notified (2nd page):  Time of second page:  Responding MD: [name]  Time MD responded:  5537

## 2013-04-27 NOTE — Progress Notes (Signed)
OT Cancellation Note  Patient Details Name: Keith Duffy MRN: [id number] DOB: January 18, 1946   Cancelled Treatment:    Reason Eval/Treat Not Completed: Medical issues which prohibited therapy. Note decreased Hgb to 5.9. Will check back later time.  [place], [place] 04/27/2013, 11:12 AM

## 2013-04-27 NOTE — Consult Note (Signed)
NAME:  ZYAN, COBY NO.:  [id number]  MEDICAL RECORD NO.:  [id number]  LOCATION:  46                         FACILITY:  [facility]  PHYSICIAN:  Volanda Napoleon, M.D.  DATE OF BIRTH:  March 12, 1945  DATE OF CONSULTATION:  04/26/2013 DATE OF DISCHARGE:                                CONSULTATION   REFERRING PHYSICIAN:  Dr. Kathee Delton.  REASON FOR CONSULTATION: 1. Pancytopenia secondary to chemotherapy. 2. 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  HISTORY OF PRESENT ILLNESS:  Mr. Filley is well known to me.  He is a 68 year old gentleman with a history of diffuse large cell non-Hodgkin lymphoma.  He presented back in July 2014.  We got him into remission with systemic chemotherapy with R-CHOP.  He then had a relapse, fairly quickly, into the CNS.  He has had two cycles of high-dose methotrexate- based chemotherapy with Rituxan/methotrexate/thiotepa/vincristine.  He completed his second dose of chemotherapy back on April 20, 2013.  I reduced that dose of chemotherapy.  He does get intrathecal DepoCyt into his Ommaya reservoir.  He got this on April 20, 2013.  His last spinal fluid cytology was negative.  He got the Neulasta.  He is on antibiotic prophylaxis.  He unfortunately has declined once again.  His blood counts have dropped very quickly.  He has had diarrhea.  He was admitted.  He had noted a fever.  He had a few bouts of emesis.  He does have diabetes.  He is insulin dependent.  On admission, the CT scan that was done showed "pancolitis."  There is a borderline splenomegaly.  Everything else looked okay.  He did have moderate left foraminal stenosis at L5 and S1.  He was placed on antibiotics with Flagyl and ciprofloxacin.  At home, he was on Levaquin, ketoconazole, and acyclovir.  Blood work on admission, his white cell count is 0.1.  Hemoglobin 7, hematocrit 20, and platelet count 43,000.  Today, his white  cell count is still 0.1, hemoglobin 6.5, and platelet count 23,000.  His potassium is on the low side at 3.1.  We will replace this.  Glucose is not too elevated.  LFTs looked okay.  Albumin was 2.3.  I very much appreciate Dr. Wyline Copas admitting Mr. Glenford Peers.  Again, he has had no fever.  He has had no bleeding.  He has had diarrhea.  He is just very weak.  PAST MEDICAL HISTORY: 1. Remarkable for the diffuse large cell non-Hodgkin lymphoma. 2. Insulin-dependent diabetes. 3. Hypertension. 4. Left footdrop. 5. Right cranial nerve VII palsy.  ALLERGIES:  CODEINE.  MEDICATIONS:  His admission medications were: 1. Colace 100 mg p.o. daily. 2. Famvir 250 mg p.o. daily. 3. Fentanyl patch 12.5 mcg every 3 days to the skin. 4. Insulin per schedule. 5. Ketoconazole 200 mg p.o. daily. 6. Levaquin 500 mg p.o. daily. 7. Ativan 0.5 mg q.6 hours p.r.n. 8. Mobic 15 mg p.o. daily. 9. Metoprolol 25 mg p.o. b.i.d. 10.Zofran 8 mg q.12 hours p.r.n. 11.Oxycodone 10/20 mg p.o. q.4 hours p.r.n. pain. 12.Compazine 10 mg p.o. q.6 hours p.r.n. 13.Flomax 0.4 mg p.o. daily p.r.n.  SOCIAL HISTORY:  Unremarkable.  PHYSICAL EXAMINATION:  GENERAL:  This is an ill-appearing white gentleman, in no obvious distress. VITAL SIGNS:  Temperature of 98.8, pulse 94, respiratory rate 16, blood pressure 141/74.  Weight is 168 pounds. HEAD AND NECK:  Some dry oral mucosa.  There is no scleral icterus.  He has better function of his right cranial nerve VII.  No adenopathy in the neck. RESPIRATORY:  Lungs sound clear. CARDIAC:  Slightly tachycardic, but regular.  He had no murmurs, rubs, or bruits. ABDOMEN:  Soft.  Bowel sounds are quite decreased.  There is some diffuse tenderness to palpation.  There is no guarding.  He has no fluid wave.  There is no palpable hepatosplenomegaly. EXTREMITIES:  Some muscle atrophy in upper and lower extremities.  He does have some edema in his feet and ankles, this is probably 1+.   He has spontaneous strength in his arms and legs.  He still has left a footdrop. SKIN:  No rashes.  IMPRESSION:  Mr. 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  Again, his last fluid cytology came back negative.  At this time, I am quite happy about it.  I had to believe a lot of his current symptoms are because of the neutropenia.  Again, he got Neulasta after chemotherapy.  I will hold off on Neupogen right now.  Supportive care is appropriate.  IV antibiotics is appropriate.  Blood support with packed red blood cells is indicated right now.  He does not need platelets as he is not bleeding.  He will pull out of this.  This may take a few days until his white cell count begins to improve.  Once his white cell count improves, then we can probably see him improve.  He is going to need physical therapy while in the hospital.  He is not really capable of doing that right now, however.  Nutrition will be key.  He is on clear liquids.  Hopefully, his diet can be advanced gradually.  We will certainly follow along.  Again, I very much appreciate the help from the hospitalist, with Mr. Croft.  His wife is doing a great job with him.  She is really having a tough time right now, however.  There are issues that go beyond his physical health and she has been trying to deal with all this.     Volanda Napoleon, M.D.     PRE/MEDQ  D:  04/26/2013  T:  04/27/2013  Job:  [id number]

## 2013-04-27 NOTE — Progress Notes (Signed)
PT Cancellation Note  Patient Details Name: Westlee Devita MRN: [id number] DOB: [birth year]   Cancelled Treatment:    Reason Eval/Treat Not Completed: Medical issues which prohibited therapy--noted events of yesterday and that currently Hgb is 5.9. Will hold PT at this time and check back after transfusion(s) completed/once pt is medically appropriate. Thanks.    Weston Anna, MPT Pager: [phone]

## 2013-04-27 NOTE — Progress Notes (Signed)
[facility] PROGRESS NOTE  Keith Duffy WNU:[id number] DOB: 18-Jul-1945 DOA: 04/25/2013 PCP: Mateo Flow, MD  Brief narrative: 68 y.o. male with past medical history of non-Hodgkin's lymphoma, status post recent admission for chemotherapy, diabetes who presented to [facility] ED 04/25/2013 with worsening generalized weakness, fatigue, poor appetite. Patient was subsequently found to have diffuse wall thickening in the colon compatible with pancolitis, probably infectious. In addition, blood work on admission revealed white blood cell count of 0.2, hemoglobin 7.1, platelets 43, sodium 134 and potassium 3.6. Chest x-ray showed low lung volumes without evidence of acute cardiopulmonary process. Abdominal x-ray showed nonspecific bowel gas pattern. CT abdomen showed diffuse wall thickening in the colon compatible with pancolitis, probably infectious. Hospital course further complicated due to fever and severe neutropenia.  Assessment/Plan:   Principal Problem:  Generalized weakness in the setting of non-Hodgkin's lymphoma  - Likely multifactorial secondary to non-Hodgkin's lymphoma, effects of chemotherapy, colitis and anemia - Obtain physical therapy evaluation once patient able to participate  - Continue Ritalin 5 mg by mouth 3 times a day   Active Problems:  Colitis  - Continue Ciprofloxacin and Flagyl which was started on the admission - Continue IV fluids, normal saline at 100 cc an hour  - Continue Protonix 40 mg IV every 12 hours  - Pain management with morphine 2 mg IV every 4 hours when necessary severe pain and oxycodone 10-20 mg by mouth every 4 hours as needed for moderate pain DM (diabetes mellitus), type 2, with complications of neuropathy  - Continue NovoLog sliding scale, continue Lantus 25 units at bedtime  - Check A1c in the morning with the rest of the blood work  - CBGs in past 24 hours: 158, 170, 191 NHL (non-Hodgkin's lymphoma)  - Management per oncology  - pt was in  remission at one point with R-CHOP but then had relapse fairly quickly. Pt then underwent 2 cycles with high dose methotrexate based chemotherapy with rituxan/methotrexate/thiotepa/vincristine and has completed his second dose of chemo on April 20, 2013.  Pancytopenia  - Likely secondary to non-Hodgkin's lymphoma and sequela of chemotherapy  Chronic pain syndrome - Patient has fentanyl patch 12.5 mcg, morphine 2 mg IV every 4 hours as needed for severe pain and oxycodone every 4 hours as needed for moderate pain Constipation - MiraLAX daily as needed along with Colace 100 mg by mouth twice a day Febrile neutropenia - Likely sequela of chemotherapy - Patient received Neulasta support with last chemotherapy but per oncology patient will receive Neupogen starting today as he is severely neutropenic - White blood cell count this morning is less than 0.1 Anemia of chronic disease - Secondary to history of malignancy and sequela of chemotherapy - Patient could not get blood transfusion on 04/26/2013 due to spiking fever. Patient will be given Tylenol and Benadryl today and we will proceed with blood transfusion today, goal is for 2 units of PRBC Thrombocytopenia - Secondary to history of malignancy and sequela of chemotherapy - Scattered ecchymosis on upper extremities - Continue to monitor platelet count. No active signs of bleeding. We'll hold off on platelet transfusion for now. 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 Marin Olp)  Procedures:  None  Antiinfectives:  Cipro 04/25/2013 -->  Flagyl 04/25/2013 --> Acyclovir  04/27/2013 -->  Leisa Lenz, MD  [facility] Pager [phone]  If 7PM-7AM, please contact  night-coverage [email or web address] Password [email or web address] 04/27/2013, 10:52 AM   LOS: 2 days    HPI/Subjective: Had spike in fever and did not get blood transfusion yesterday. Very weak.   Objective: Filed Vitals:   04/26/13 1634 04/26/13 1754 04/26/13 2121 04/27/13 0557  BP:   138/66 124/74  Pulse:   96 116  Temp: 99.8 F (37.7 C) 100.8 F (38.2 C) 97.9 F (36.6 C) 98.6 F (37 C)  TempSrc:  Oral Oral Oral  Resp:   16 16  Height:      Weight:      SpO2:   99% 100%    Intake/Output Summary (Last 24 hours) at 04/27/13 1052 Last data filed at 04/27/13 0900  Gross per 24 hour  Intake    740 ml  Output    525 ml  Net    215 ml    Exam:  General: Pt is alert, follows commands appropriately, not in acute distress; buccal mucosa ulcerations noted Cardiovascular: Regular rate and rhythm, S1/S2 appreciated  Respiratory: Clear to auscultation bilaterally, no wheezing, no crackles, no rhonchi  Abdomen: Soft, non tender, non distended, bowel sounds present, no guarding  Extremities: ankle +1 pitting edema, pulses DP and PT palpable bilaterally  Neuro: Grossly nonfocal   Data Reviewed: Basic Metabolic Panel:  Recent Labs Lab 04/21/13 0944 04/25/13 0615 04/25/13 0630 04/26/13 0445 04/27/13 0500  NA 137 134* 135* 134* 134*  K 3.5 3.6* 3.4* 3.1* 3.0*  CL 98 98 99 99 100  CO2 33 22  --  22 20  GLUCOSE 241* 231* 224* 159* 186*  BUN 22 23 23 18 14   CREATININE 0.9 0.72 0.80 0.70 0.64  CALCIUM 8.7 8.0*  --  7.5* 7.5*   Liver Function Tests:  Recent Labs Lab 04/21/13 0944 04/25/13 0615 04/26/13 0445 04/27/13 0500  AST 16 12 11 10   ALT 23 15 12 9   ALKPHOS 67 65 53 51  BILITOT 0.80 1.3* 1.3* 1.3*  PROT 5.3* 4.9* 4.2* 4.3*  ALBUMIN  --  2.8* 2.3* 2.3*    Recent Labs Lab 04/25/13 0615  LIPASE 16   No results found for this basename: AMMONIA,  in the last 168 hours CBC:  Recent Labs Lab 04/21/13 0944 04/25/13 0615 04/25/13 0630 04/26/13 0445 04/27/13 0500  WBC 13.7*  0.2*  --  0.1* <0.1*  NEUTROABS 13.6*  --   --   --   --   HGB 10.0* 7.1* 9.2* 6.5* 5.9*  HCT 29.2* 20.1* 27.0* 17.9* 16.3*  MCV 95 92.2  --  91.3 90.1  PLT 130* 43*  --  23* 14*   Cardiac Enzymes:  Recent Labs Lab 04/25/13 0615  TROPONINI <0.30   BNP: No components found with this basename: POCBNP,  CBG:  Recent Labs Lab 04/26/13 0828 04/26/13 1208 04/26/13 1727 04/26/13 2108 04/27/13 0808  GLUCAP 165* 169* 158* 170* 191*    No results found for this or any previous visit (from the past 240 hour(s)).   Studies: Ct Abdomen Pelvis W Contrast 04/25/2013    IMPRESSION: 1. Diffuse wall thickening in the colon compatible with pancolitis, probably infectious. 2. Trace bilateral pleural effusions and trace ascites. 3. Borderline splenomegaly. 4. Continued marrow heterogeneity compatible with marrow infiltrative process. 5. Hypodense hepatic lesions favor a hemangiomas. Right right renal cyst. 6. Contrast medium in the distal esophagus, suspicious for a gastroesophageal reflux. 7. Left foraminal stenosis  at L5-S1 due to facet arthropathy.   Electronically Signed   By: Sherryl Barters M.D.   On: 04/25/2013 15:59    Scheduled Meds: . acyclovir  5 mg/kg Intravenous 3 times per day  . ciprofloxacin  400 mg Intravenous Q12H  . docusate sodium  100 mg Oral BID  . feeding supplement   1 Container Oral BID BM  . fentaNYL  12.5 mcg Transdermal Q72H  . filgrastim  480 mcg Subcutaneous Daily  . furosemide  10 mg Intravenous Once  . insulin aspart  0-15 Units Subcutaneous TID WC  . insulin aspart  0-5 Units Subcutaneous QHS  . insulin glargine  25 Units Subcutaneous QHS  . methylphenidate  5 mg Oral TID WC  . metoprolol tartrate  25 mg Oral BID  . metronidazole  500 mg Intravenous Q8H  . pantoprazole  40 mg Intravenous Q12H  . potassium chloride  10 mEq Intravenous Q1 Hr x 6  . sodium bicarbonate  1 application Mouth Rinse 6 X Daily   Continuous Infusions: . sodium chloride 100 mL/hr  at 04/25/13 1831

## 2013-04-27 NOTE — Progress Notes (Signed)
Mr. Gift had a temperature spike with blood transfusion. As such, he really was not transfused. He still profoundly neutropenic. I will start Neupogen on him.  Is not unusual to see a little bit of a reaction with blood during neutropenia. I think we have to really pushed through and transfuse him in today.  He is incredibly weak. This is a combination of both the neutropenia and anemia. Again, all start Neupogen on him.  He is taking liquids. I really want to liberalize his diet and I've irregular food is okay.  He's not had diarrhea.  His vital signs show temperature 98.6. Pulse 116. Blood pressure 124/74. Oral exam shows some slight ulcerations in the left buccal mucosa. Lungs are clear. Cardiac exam tachycardic regular. Abdomen soft. Bowel sounds a little more active. No guarding. Not much tenderness to palpation. Extremities shows muscle atrophy in upper and lower extremities. Neurological exam shows improved right cranial nerve 7 function.  His potassium is still low. I will give some more IV potassium.  I will put him on IV acyclovir given his mouth findings and his profound neutropenia.  I just don't see him improving until his white cell count starts to come up.  It is clear that he will not do any further IV chemotherapy. He really has had a tough time due to with dosage reductions.  I very much appreciate the outstanding care and given to him by the hospitalists and the staff on 3 E.!!  [name]  Ephesians 6:9-10

## 2013-04-28 ENCOUNTER — Inpatient Hospital Stay (HOSPITAL_COMMUNITY): Payer: Medicare Other

## 2013-04-28 DIAGNOSIS — E876 Hypokalemia: Secondary | ICD-10-CM | POA: Diagnosis present

## 2013-04-28 DIAGNOSIS — D61818 Other pancytopenia: Secondary | ICD-10-CM

## 2013-04-28 DIAGNOSIS — E43 Unspecified severe protein-calorie malnutrition: Secondary | ICD-10-CM

## 2013-04-28 LAB — COMPREHENSIVE METABOLIC PANEL
ALK PHOS: 50 U/L (ref 39–117)
ALT: 9 U/L (ref 0–53)
AST: 8 U/L (ref 0–37)
Albumin: 2.1 g/dL — ABNORMAL LOW (ref 3.5–5.2)
BILIRUBIN TOTAL: 1.1 mg/dL (ref 0.3–1.2)
BUN: 9 mg/dL (ref 6–23)
CO2: 17 meq/L — AB (ref 19–32)
Calcium: 7.7 mg/dL — ABNORMAL LOW (ref 8.4–10.5)
Chloride: 98 mEq/L (ref 96–112)
Creatinine, Ser: 0.59 mg/dL (ref 0.50–1.35)
GLUCOSE: 171 mg/dL — AB (ref 70–99)
Potassium: 2.9 mEq/L — CL (ref 3.7–5.3)
SODIUM: 132 meq/L — AB (ref 137–147)
Total Protein: 4.6 g/dL — ABNORMAL LOW (ref 6.0–8.3)

## 2013-04-28 LAB — TYPE AND SCREEN
ABO/RH(D): O POS
Antibody Screen: NEGATIVE
UNIT DIVISION: 0
Unit division: 0
Unit division: 0

## 2013-04-28 LAB — CBC WITH DIFFERENTIAL/PLATELET
BAND NEUTROPHILS: 0 % (ref 0–10)
BASOS PCT: 0 % (ref 0–1)
Basophils Absolute: 0 10*3/uL (ref 0.0–0.1)
Eosinophils Absolute: 0 10*3/uL (ref 0.0–0.7)
Eosinophils Relative: 0 % (ref 0–5)
HEMATOCRIT: 21.9 % — AB (ref 39.0–52.0)
Hemoglobin: 7.9 g/dL — ABNORMAL LOW (ref 13.0–17.0)
Lymphocytes Relative: 0 % — ABNORMAL LOW (ref 12–46)
Lymphs Abs: 0 10*3/uL — ABNORMAL LOW (ref 0.7–4.0)
MCH: 31.2 pg (ref 26.0–34.0)
MCHC: 36.1 g/dL — AB (ref 30.0–36.0)
MCV: 86.6 fL (ref 78.0–100.0)
MONO ABS: 0 10*3/uL — AB (ref 0.1–1.0)
Monocytes Relative: 0 % — ABNORMAL LOW (ref 3–12)
Neutrophils Relative %: 0 % — ABNORMAL LOW (ref 43–77)
Platelets: 7 10*3/uL — CL (ref 150–400)
RBC: 2.53 MIL/uL — ABNORMAL LOW (ref 4.22–5.81)
RDW: 16.7 % — ABNORMAL HIGH (ref 11.5–15.5)
WBC: 0.1 10*3/uL — CL (ref 4.0–10.5)
nRBC: 0 /100 WBC

## 2013-04-28 LAB — GLUCOSE, CAPILLARY
GLUCOSE-CAPILLARY: 174 mg/dL — AB (ref 70–99)
Glucose-Capillary: 209 mg/dL — ABNORMAL HIGH (ref 70–99)
Glucose-Capillary: 153 mg/dL — ABNORMAL HIGH (ref 70–99)
Glucose-Capillary: 162 mg/dL — ABNORMAL HIGH (ref 70–99)

## 2013-04-28 LAB — MAGNESIUM: Magnesium: 1.4 mg/dL — ABNORMAL LOW (ref 1.5–2.5)

## 2013-04-28 LAB — HEMOGLOBIN A1C
Hgb A1c MFr Bld: 5.8 % — ABNORMAL HIGH (ref <5.7)
Mean Plasma Glucose: 120 mg/dL — ABNORMAL HIGH (ref <117)

## 2013-04-28 MED ORDER — POTASSIUM CHLORIDE 10 MEQ/50ML IV SOLN
10.0000 meq | INTRAVENOUS | Status: AC
  Administered 2013-04-28 (×4): 10 meq via INTRAVENOUS
  Filled 2013-04-28 (×6): qty 50

## 2013-04-28 MED ORDER — METHYLPREDNISOLONE SODIUM SUCC 125 MG IJ SOLR
125.0000 mg | Freq: Once | INTRAMUSCULAR | Status: AC
  Administered 2013-04-28: 125 mg via INTRAVENOUS
  Filled 2013-04-28: qty 2

## 2013-04-28 MED ORDER — ACETAMINOPHEN 325 MG PO TABS
650.0000 mg | ORAL_TABLET | Freq: Once | ORAL | Status: AC
  Filled 2013-04-28: qty 2

## 2013-04-28 MED ORDER — DIPHENHYDRAMINE HCL 50 MG/ML IJ SOLN
50.0000 mg | Freq: Once | INTRAMUSCULAR | Status: AC
  Administered 2013-04-28: 50 mg via INTRAVENOUS

## 2013-04-28 MED ORDER — SODIUM CHLORIDE 0.9 % IV SOLN
500.0000 mg | Freq: Four times a day (QID) | INTRAVENOUS | Status: DC
  Administered 2013-04-28 – 2013-05-04 (×24): 500 mg via INTRAVENOUS
  Filled 2013-04-28 (×26): qty 500

## 2013-04-28 MED ORDER — DIPHENHYDRAMINE HCL 50 MG/ML IJ SOLN
INTRAMUSCULAR | Status: AC
  Administered 2013-04-28: 50 mg via INTRAVENOUS
  Filled 2013-04-28: qty 1

## 2013-04-28 NOTE — Progress Notes (Signed)
PROGRESS NOTE   Keith Duffy YQI:[id number] DOB: 04-10-1945 DOA: 04/25/2013 PCP: Mateo Flow, MD  Brief narrative: Keith Duffy is an 68 y.o. male a PMH of diabetes, non-Hodgkin's lymphoma status post recent chemotherapy was admitted with pancytopenia and fever on 04/25/13.  Assessment/Plan: Principal Problem:   NHL (non-Hodgkin's lymphoma) with febrile pancytopenia status post chemotherapy Patient has been febrile up to 101.9. Blood and urine cultures sent. Chest x-ray negative for pneumonia today. Neupogen started 04/27/13. Given 2 units of packed red blood cells 04/27/13 and a unit of platelets today. Monitor counts for recovery. Continue Primaxin. Flagyl discontinued since Clostridium difficile unlikely and Primaxin provides the same spectrum of coverage otherwise. Continue acyclovir. Active Problems:   Hypokalemia  6 runs of potassium ordered. Check magnesium.   Generalized weakness Secondary to febrile pancytopenia and occult infection of uncertain source.   Metastatic cancer Management per oncologist.   DM (diabetes mellitus), type 2, uncontrolled Currently being managed with moderate scale SSI and Lantus 25 units each bedtime. CBGs 128-209.   Dehydration Continue IV fluids.   DVT Prophylaxis Continue SCDs.  Code Status: Full. Family Communication: Wife updated at bedside. Disposition Plan: Home when stable.   IV access:  Port-A-Cath right chest  Medical Consultants:  Dr. Charlesetta Garibaldi, Oncology  Other Consultants:  Physical therapy  Occupational therapy  Anti-infectives:  Imipenem 04/28/48--->  Acyclovir 04/27/13--->   Cipro 04/25/13---> 04/28/13  Flagyl 04/25/13---> 04/28/13  Famvir 04/25/13---> 04/27/13  Ketoconazole 04/24/13---> 04/25/13  Levaquin 04/25/13---> 04/25/13   HPI/Subjective: Keith Duffy denies shortness of breath or cough. Did have some dysuria but now has a Foley catheter in place. Minimal diarrhea. No appetite.  Objective: Filed Vitals:   04/27/13 1915 04/27/13 2134 04/28/13 0605 04/28/13 1215  BP: 148/80 136/80 155/77 140/78  Pulse: 98 108 116 98  Temp: 98 F (36.7 C) 99.3 F (37.4 C) 101.9 F (38.8 C) 97.7 F (36.5 C)  TempSrc: Oral Oral Oral Oral  Resp: 20 20 20 16   Height:      Weight:      SpO2:  100% 99% 100%    Intake/Output Summary (Last 24 hours) at 04/28/13 1313 Last data filed at 04/28/13 1230  Gross per 24 hour  Intake 1387.5 ml  Output   1850 ml  Net -462.5 ml    Exam: Gen:  NAD Cardiovascular:  Tachycardic, No M/R/G Respiratory:  Lungs CTAB Gastrointestinal:  Abdomen soft, NT/ND, + BS Extremities:  No C/E/C  Data Reviewed: Basic Metabolic Panel:  Recent Labs Lab 04/25/13 0615 04/25/13 0630 04/26/13 0445 04/27/13 0500 04/28/13 0500  NA 134* 135* 134* 134* 132*  K 3.6* 3.4* 3.1* 3.0* 2.9*  CL 98 99 99 100 98  CO2 22  --  22 20 17*  GLUCOSE 231* 224* 159* 186* 171*  BUN 23 23 18 14 9   CREATININE 0.72 0.80 0.70 0.64 0.59  CALCIUM 8.0*  --  7.5* 7.5* 7.7*   GFR Estimated Creatinine Clearance: 92.1 ml/min (by C-G formula based on Cr of 0.59). Liver Function Tests:  Recent Labs Lab 04/25/13 0615 04/26/13 0445 04/27/13 0500 04/28/13 0500  AST 12 11 10 8   ALT 15 12 9 9   ALKPHOS 65 53 51 50  BILITOT 1.3* 1.3* 1.3* 1.1  PROT 4.9* 4.2* 4.3* 4.6*  ALBUMIN 2.8* 2.3* 2.3* 2.1*    Recent Labs Lab 04/25/13 0615  LIPASE 16   CBC:  Recent Labs Lab 04/25/13 0615 04/25/13 0630 04/26/13 0445 04/27/13 0500 04/28/13 0500  WBC 0.2*  --  0.1* <0.1* 0.1*  HGB 7.1* 9.2* 6.5* 5.9* 7.9*  HCT 20.1* 27.0* 17.9* 16.3* 21.9*  MCV 92.2  --  91.3 90.1 86.6  PLT 43*  --  23* 14* 7*   Cardiac Enzymes:  Recent Labs Lab 04/25/13 0615  TROPONINI <0.30   CBG:  Recent Labs Lab 04/27/13 1212 04/27/13 1721 04/27/13 2139 04/28/13 0833 04/28/13 1137  GLUCAP 179* 128* 166* 209* 174*   Microbiology No results found for this or any previous visit (from the past 240  hour(s)).   Procedures and Diagnostic Studies: Ct Abdomen Pelvis W Contrast  04/25/2013   CLINICAL DATA:  Abdominal pain. Nausea and vomiting. Recent chemotherapy for non-Hodgkin's lymphoma. Reduced bowel sounds.  EXAM: CT ABDOMEN AND PELVIS WITH CONTRAST  TECHNIQUE: Multidetector CT imaging of the abdomen and pelvis was performed using the standard protocol following bolus administration of intravenous contrast.  CONTRAST:  27mL OMNIPAQUE IOHEXOL 300 MG/ML SOLN, 120mL OMNIPAQUE IOHEXOL 300 MG/ML SOLN  COMPARISON:  DG ABD PORTABLE 1V dated 04/25/2013; NM PET IMAGE RESTAG (PS) SKULL BASE TO THIGH dated 04/02/2013; US ABDOMEN COMPLETE dated 08/19/2012; MR L SPINE WO/W CM dated 02/24/2013  FINDINGS: Trace bilateral pleural effusions. Contrast medium in the distal esophagus may reflect reflux. Coronary artery atherosclerotic calcification.  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 Borderline splenomegaly at 410 cc. Pancreas and adrenal glands normal.  Right kidney upper pole cyst. No dilated small bowel. Small amount of pelvic ascites. Appendix normal.  Diffuse large bowel wall thickening favoring pancolitis.  Urinary bladder unremarkable. Abnormal right posterior eccentricity of the prostate gland.  Diffuse marrow heterogeneity is again observed and may reflect infiltrative marrow process. Facet arthropathy causes moderate left foraminal stenosis at L5-S1.  IMPRESSION: 1. Diffuse wall thickening in the colon compatible with pancolitis, probably infectious. 2. Trace bilateral pleural effusions and trace ascites. 3. Borderline splenomegaly. 4. Continued marrow heterogeneity compatible with marrow infiltrative process. 5. Hypodense hepatic lesions favor a hemangiomas. Right right renal cyst. 6. Contrast medium in the distal esophagus, suspicious for a gastroesophageal reflux. 7. Left foraminal stenosis at L5-S1 due to facet arthropathy.   Electronically Signed   By: Sherryl Barters M.D.   On: 04/25/2013 15:59   Nm Pet  Image Restag (ps) Skull Base To Thigh  04/02/2013   CLINICAL DATA:  Subsequent treatment strategy for non-Hodgkin's lymphoma.  EXAM: NUCLEAR MEDICINE PET SKULL BASE TO THIGH  FASTING BLOOD GLUCOSE:  Value: 102 mg/dl  TECHNIQUE: 9.0 mCi F-18 FDG was injected intravenously. Full-ring PET imaging was performed from the skull base to thigh after the radiotracer. CT data was obtained and used for attenuation correction and anatomic localization.  COMPARISON:  CT BIOPSY dated 03/19/2013; MR L SPINE WO/W CM dated 02/24/2013; NM PET IMAGE RESTAG (PS) SKULL BASE TO THIGH dated 11/13/2012  FINDINGS: NECK  No hypermetabolic cervical lymph nodes are identified.There are no lesions of the pharyngeal mucosal space.  CHEST  There are no hypermetabolic mediastinal, hilar or axillary lymph nodes. There is no hypermetabolic pulmonary activity. The lungs are clear. Coronary artery calcifications are noted.  ABDOMEN/PELVIS  There is no hypermetabolic activity within the liver, adrenal glands, spleen or pancreas. There is no hypermetabolic nodal activity. There are stable low-density hepatic lesions on images 116 and 130. There is a stable cyst in the upper pole of the right kidney and stable asymmetric nodularity of the right lobe of the prostate gland.  SKELETON  There is diffuse heterogeneity of the bones consistent with treated lymphoma. No residual hypermetabolic  activity is identified. There is no evidence of pathologic fracture. There is low-level activity within the lumbar spinal canal, demonstrating an SUV max of approximately 4.0. This is nonfocal and could be related to prior lumbar puncture, chemotherapy or inflammation. Residual disease is not completely excluded in this patient who had positive CSF for lymphoma on 03/05/2013.  IMPRESSION: 1. Low-level metabolic activity within the lumbar spinal canal may be related to prior LP or intrathecal chemotherapy. Residual CNS lymphoma cannot be excluded. 2. No other evidence of  metabolically active lymphoma. 3. Diffuse heterogeneity of the bones consistent with treated lymphoma.   Electronically Signed   By: Camie Patience M.D.   On: 04/02/2013 14:24   Dg Chest Port 1 View  04/28/2013   CLINICAL DATA Fever.  Neutropenia.  EXAM PORTABLE CHEST - 1 VIEW  COMPARISON DG CHEST 1V PORT dated 04/25/2013  FINDINGS Heart report in stable position. Mediastinum and hilar structures normal. No focal infiltrates. Mild basilar atelectasis. Heart size normal. No pleural effusion or pneumothorax. No acute osseous abnormality.  IMPRESSION 1. Power port in stable position. 2. Mild bibasilar atelectasis, otherwise negative chest.  SIGNATURE  Electronically Signed   By: [place]   On: 04/28/2013 08:16   Dg Chest Port 1 View  04/25/2013   CLINICAL DATA:  Weakness.  EXAM: PORTABLE CHEST - 1 VIEW  COMPARISON:  Chest x-ray 11/27/2012.  FINDINGS: Right internal jugular single-lumen power porta cath with tip terminating in the mid superior vena cava. Lung volumes are low. No consolidative airspace disease. No pleural effusions. No pneumothorax. No pulmonary nodule or mass noted. Pulmonary vasculature and the cardiomediastinal silhouette are within normal limits.  IMPRESSION: 1. Low lung volumes without radiographic evidence of acute cardiopulmonary disease.   Electronically Signed   By: Vinnie Langton M.D.   On: 04/25/2013 07:22   Dg Abd Portable 1v  04/25/2013   CLINICAL DATA:  Nausea and vomiting. History of non-Hodgkin's lymphoma  EXAM: PORTABLE ABDOMEN - 1 VIEW  COMPARISON:  NM PET IMAGE RESTAG (PS) SKULL BASE TO THIGH dated 04/02/2013  FINDINGS: Patchy sclerotic densities in the pelvis and femoral heads. Findings are similar to the prior examination. There is a small amount of gas and stool in the right colon. Nonobstructive bowel gas pattern.  IMPRESSION: Nonspecific bowel gas pattern.  Patchy sclerotic densities in the femoral heads. Findings are similar to the prior examination and consistent  with history of lymphoma.   Electronically Signed   By: Markus Daft M.D.   On: 04/25/2013 08:51    Scheduled Meds: . acetaminophen  650 mg Oral Once  . acyclovir  5 mg/kg Intravenous 3 times per day  . artificial tears   Right Eye 3 times per day  . chlorhexidine  10 mL Mouth/Throat QID  . docusate sodium  100 mg Oral BID  . feeding supplement (RESOURCE BREEZE)  1 Container Oral BID BM  . fentaNYL  12.5 mcg Transdermal Q72H  . filgrastim  480 mcg Subcutaneous Daily  . imipenem-cilastatin  500 mg Intravenous Q6H  . insulin aspart  0-15 Units Subcutaneous TID WC  . insulin aspart  0-5 Units Subcutaneous QHS  . insulin glargine  25 Units Subcutaneous QHS  . methylphenidate  5 mg Oral TID WC  . metoprolol tartrate  25 mg Oral BID  . pantoprazole (PROTONIX) IV  40 mg Intravenous Q12H  . potassium chloride  10 mEq Intravenous Q1 Hr x 6  . sodium bicarbonate/sodium chloride  1 application Mouth Rinse  6 X Daily   Continuous Infusions: . sodium chloride 100 mL/hr at 04/25/13 1831    Time spent: 35 minutes with > 50% of time discussing current diagnostic test results, clinical impression and plan of care with the patient and his wife including but pancytopenia means and how each of his cell lines have been affected.    LOS: 3 days   [facility] Pager [phone]. If unable to reach me by pager, please call my cell phone at [phone].  *Please note that the hospitalists switch teams on Wednesdays. Please call the flow manager at [phone] if you are having difficulty reaching the hospitalist taking care of this patient as she can update you and provide the most up-to-date pager number of provider caring for the patient. If 8PM-8AM, please contact night-coverage at [email or web address], password [facility]  04/28/2013, 1:13 PM    **Disclaimer: This note was dictated with voice recognition software. Similar sounding words can inadvertently be transcribed and this note may contain  transcription errors which may not have been corrected upon publication of note.**

## 2013-04-28 NOTE — Progress Notes (Signed)
Now with temperature spike of 101.9. We will need to culture blood and urine. Chest x-ray will be done. I will add Primaxin for antibiotics.  He got 2 units of blood. He clearly is not much better. I think that this profound neutropenia is really taken its toll. I added Neupogen yesterday.  Has a Foley catheter in now.  His not eating all that much. He did have some diarrhea. He is on Cipro and Flagyl. I stopped the Cipro.  His potassium 2.9. I will replacers IV potassium.  His platelet count now down to 7. We will give him one unit of platelets. Hemoglobin 7.9.  His blood sugars have been adequate.  His mouth is somewhat dry. He is getting mouth rinses. His mouth burns a little bit.  On his exam, temperature 101.9. Pulse is 116. Blood pressure 155/77. Oral exam shows dry mucosa. There is some slight worsening of the right cranial nerve 7 palsy. Lungs show some scattered crackles. Cardiac exam tachycardia regular. Abdomen soft. Bowel sounds present but decreased. No obvious tenderness to palpation. Extremities shows symmetric weakness in arms and legs. Skin exam shows some scattered ecchymoses. Neurological exam shows no focal neurological deficits.  I really don't think that he is going to get much better until his white cell count comes up. This I really think this was driving his current condition. I hope that is not getting septic. Again he has no white cells. I have him on Neupogen.  I told his wife that I think that the next couple days will really be important. I think that if his white cell count does not come up, then he may get worse.  We'll see was cultures show. We'll see what his chest x-ray shows.  I spent a good half hour with he and his wife. Clearly, his status is much more tenuous right now.  [name]  [name]

## 2013-04-28 NOTE — Progress Notes (Signed)
PT Cancellation Note  Patient Details Name: Keith Duffy MRN: [id number] DOB: 14-Jul-1945   Cancelled Treatment:    Reason Eval/Treat Not Completed: Medical issues which prohibited therapy   Claretha Cooper 04/28/2013, 8:23 AM

## 2013-04-28 NOTE — Progress Notes (Signed)
CRITICAL VALUE ALERT  Critical value received:  PLT 7, WBC 0.1  Date of notification:  04/28/13  Time of notification:  0630  Critical value read back:YES  Nurse who received alert:  Lottie Dawson  MD notified (1st page):  [name]  Time of first page: 0640  MD notified (2nd page):  Time of second page:  Responding MD:  [name]  Time MD responded:  [phone]

## 2013-04-28 NOTE — Progress Notes (Signed)
OT Cancellation Note  Patient Details Name: Keith Duffy MRN: [id number] DOB: 1945/11/15   Cancelled Treatment:    Reason Eval/Treat Not Completed: Medical issues which prohibited therapy.  Noted platelets are 7.  Will check back another day.  Moxie Kalil 04/28/2013, 9:30 AM Lesle Chris, OTR/L [phone] 04/28/2013

## 2013-04-29 DIAGNOSIS — K1230 Oral mucositis (ulcerative), unspecified: Secondary | ICD-10-CM

## 2013-04-29 DIAGNOSIS — K121 Other forms of stomatitis: Secondary | ICD-10-CM

## 2013-04-29 LAB — GLUCOSE, CAPILLARY
Glucose-Capillary: 191 mg/dL — ABNORMAL HIGH (ref 70–99)
Glucose-Capillary: 169 mg/dL — ABNORMAL HIGH (ref 70–99)
Glucose-Capillary: 179 mg/dL — ABNORMAL HIGH (ref 70–99)
Glucose-Capillary: 162 mg/dL — ABNORMAL HIGH (ref 70–99)

## 2013-04-29 LAB — COMPREHENSIVE METABOLIC PANEL
ALBUMIN: 2 g/dL — AB (ref 3.5–5.2)
ALT: 10 U/L (ref 0–53)
AST: 7 U/L (ref 0–37)
Alkaline Phosphatase: 54 U/L (ref 39–117)
BILIRUBIN TOTAL: 0.8 mg/dL (ref 0.3–1.2)
BUN: 12 mg/dL (ref 6–23)
CO2: 18 mEq/L — ABNORMAL LOW (ref 19–32)
Calcium: 7.9 mg/dL — ABNORMAL LOW (ref 8.4–10.5)
Chloride: 104 mEq/L (ref 96–112)
Creatinine, Ser: 0.56 mg/dL (ref 0.50–1.35)
Glucose, Bld: 184 mg/dL — ABNORMAL HIGH (ref 70–99)
POTASSIUM: 3.2 meq/L — AB (ref 3.7–5.3)
SODIUM: 138 meq/L (ref 137–147)
TOTAL PROTEIN: 4.7 g/dL — AB (ref 6.0–8.3)

## 2013-04-29 LAB — URINE CULTURE
Colony Count: NO GROWTH
Culture: NO GROWTH

## 2013-04-29 LAB — CBC WITH DIFFERENTIAL/PLATELET
BASOS ABS: 0 10*3/uL (ref 0.0–0.1)
Band Neutrophils: 0 % (ref 0–10)
Basophils Relative: 0 % (ref 0–1)
EOS ABS: 0 10*3/uL (ref 0.0–0.7)
Eosinophils Relative: 0 % (ref 0–5)
HCT: 20.5 % — ABNORMAL LOW (ref 39.0–52.0)
Hemoglobin: 7.4 g/dL — ABNORMAL LOW (ref 13.0–17.0)
LYMPHS PCT: 0 % — AB (ref 12–46)
Lymphs Abs: 0 10*3/uL — ABNORMAL LOW (ref 0.7–4.0)
MCH: 31 pg (ref 26.0–34.0)
MCHC: 36.1 g/dL — ABNORMAL HIGH (ref 30.0–36.0)
MCV: 85.8 fL (ref 78.0–100.0)
MONOS PCT: 0 % — AB (ref 3–12)
Monocytes Absolute: 0 10*3/uL — ABNORMAL LOW (ref 0.1–1.0)
NEUTROS PCT: 0 % — AB (ref 43–77)
PLATELETS: 25 10*3/uL — AB (ref 150–400)
RBC: 2.39 MIL/uL — ABNORMAL LOW (ref 4.22–5.81)
RDW: 16.8 % — AB (ref 11.5–15.5)
WBC: 0.1 10*3/uL — AB (ref 4.0–10.5)
nRBC: 0 /100 WBC

## 2013-04-29 LAB — PREPARE PLATELET PHERESIS: Unit division: 0

## 2013-04-29 MED ORDER — SACCHAROMYCES BOULARDII 250 MG PO CAPS
250.0000 mg | ORAL_CAPSULE | Freq: Two times a day (BID) | ORAL | Status: DC
  Administered 2013-04-29 – 2013-05-09 (×22): 250 mg via ORAL
  Filled 2013-04-29 (×24): qty 1

## 2013-04-29 MED ORDER — POTASSIUM CHLORIDE 10 MEQ/50ML IV SOLN
10.0000 meq | INTRAVENOUS | Status: AC
  Administered 2013-04-29 (×4): 10 meq via INTRAVENOUS
  Filled 2013-04-29 (×4): qty 50

## 2013-04-29 NOTE — Progress Notes (Signed)
PT Cancellation Note  Patient Details Name: Keith Duffy MRN: [id number] DOB: 1945/07/31   Cancelled Treatment:    Reason Eval/Treat Not Completed: Medical issues which prohibited therapy (WBC too low for mobility)   Marcelino Freestone PT [phone]  04/29/2013, 9:11 AM

## 2013-04-29 NOTE — Progress Notes (Signed)
[name] still very weak. He's not had a temperature for 24 hours. His recent cultures are still pending. Chest x-ray looked okay. There may have been some atelectasis.  His white cell count is still 0.1. He's on Neupogen.  He's had no bleeding. He had 2 episodes of diarrhea. His C. difficile is negative.  His appetite is still marginal. He's had no vomiting.  He is not complaining of pain.  His mouth is still very sore. He has some mucositis. He is on sodium bicarbonate mouth rinses.  His blood sugars have been doing okay.  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  His vital signs are okay. Temperature 96.9. Blood pressure 142/66. Oxygen saturation is 100%. Oral exam does show mucositis on the buccal because of. No adenopathy noted in the neck. Lungs are clear. Cardiac exam regular in rhythm. Abdomen soft. Nontender. No hepato- splenomegaly. Extremities shows muscle atrophy in upper and lower extremities. Skin exam shows ecchymoses scattered throughout. Neurological shows the cranial nerve 7 dysfunction on the right.  His labs, white cell count 0.1. Hemoglobin 7.4. Platelet count 25,000. Potassium 3.2. Calcium 7.9.  We will replace his potassium again.  I'll add a probiotic.  If okay with the hospitalist, I would happy to be the attending now. I appreciate all their help. I think that we can take over as primary and lessen the workload for the hospitalist.  I very much appreciate the outstanding care he's gotten well the staff on 3 E.!!  Keith E.  2 Timothy 1:7

## 2013-04-29 NOTE — Progress Notes (Signed)
Dr. Marin Olp has taken over as primary care provider. Please call [facility] if further assistance needed.  Keith Duffy 04/29/2013 8:29 AM

## 2013-04-29 NOTE — Care Management Note (Signed)
   CARE MANAGEMENT NOTE 04/29/2013  Patient:  [facility]   Account Number:  [id number]  Date Initiated:  04/29/2013  Documentation initiated by:  Ernestina Joe  Subjective/Objective Assessment:   68 yo male admitted with pancytopenia.PCP: Mateo Flow, MD. Oncologist Dr. Marin Olp.     Action/Plan:   Home when stable   Anticipated DC Date:     Anticipated DC Plan:  [place]  CM consult      Choice offered to / List presented to:  NA   DME arranged  NA      DME agency  NA     [place] arranged  NA      [place] agency  NA   Status of service:  In process, will continue to follow Medicare Important Message given?   (If response is "NO", the following Medicare IM given date fields will be blank) Date Medicare IM given:   Date Additional Medicare IM given:    Discharge Disposition:    Per UR Regulation:  Reviewed for med. necessity/level of care/duration of stay  If discussed at [place] of Stay Meetings, dates discussed:    Comments:  04/29/13 [place] [phone] Chart reviewed fo ruitlization of services. No needs assessed at this time.

## 2013-04-29 NOTE — Progress Notes (Signed)
OT Cancellation Note  Patient Details Name: Kylian Loh MRN: [id number] DOB: [birth year]   Cancelled Treatment:    Reason Eval/Treat Not Completed: Medical issues which prohibited therapy.  WBC .1  Will check back.    Harlee Pursifull 04/29/2013, 12:29 PM Lesle Chris, OTR/L [phone] 04/29/2013

## 2013-04-30 LAB — COMPREHENSIVE METABOLIC PANEL
ALT: 10 U/L (ref 0–53)
AST: 9 U/L (ref 0–37)
Albumin: 1.8 g/dL — ABNORMAL LOW (ref 3.5–5.2)
Alkaline Phosphatase: 53 U/L (ref 39–117)
BUN: 16 mg/dL (ref 6–23)
CALCIUM: 7.8 mg/dL — AB (ref 8.4–10.5)
CO2: 20 mEq/L (ref 19–32)
CREATININE: 0.52 mg/dL (ref 0.50–1.35)
Chloride: 105 mEq/L (ref 96–112)
GFR calc Af Amer: >90 mL/min (ref >90)
Glucose, Bld: 159 mg/dL — ABNORMAL HIGH (ref 70–99)
Potassium: 2.9 mEq/L — CL (ref 3.7–5.3)
Sodium: 138 mEq/L (ref 137–147)
Total Bilirubin: 0.7 mg/dL (ref 0.3–1.2)
Total Protein: 4.5 g/dL — ABNORMAL LOW (ref 6.0–8.3)

## 2013-04-30 LAB — CBC WITH DIFFERENTIAL/PLATELET
BASOS ABS: 0 10*3/uL (ref 0.0–0.1)
Band Neutrophils: 0 % (ref 0–10)
Basophils Relative: 0 % (ref 0–1)
EOS ABS: 0 10*3/uL (ref 0.0–0.7)
Eosinophils Relative: 0 % (ref 0–5)
HCT: 19.2 % — ABNORMAL LOW (ref 39.0–52.0)
Hemoglobin: 6.9 g/dL — CL (ref 13.0–17.0)
LYMPHS ABS: 0 10*3/uL — AB (ref 0.7–4.0)
Lymphocytes Relative: 0 % — ABNORMAL LOW (ref 12–46)
MCH: 30.9 pg (ref 26.0–34.0)
MCHC: 35.9 g/dL (ref 30.0–36.0)
MCV: 86.1 fL (ref 78.0–100.0)
MONO ABS: 0 10*3/uL — AB (ref 0.1–1.0)
Monocytes Relative: 0 % — ABNORMAL LOW (ref 3–12)
NEUTROS PCT: 0 % — AB (ref 43–77)
Platelets: 17 10*3/uL — CL (ref 150–400)
RBC: 2.23 MIL/uL — ABNORMAL LOW (ref 4.22–5.81)
RDW: 16.8 % — AB (ref 11.5–15.5)
WBC: 0.1 10*3/uL — CL (ref 4.0–10.5)
nRBC: 0 /100 WBC

## 2013-04-30 LAB — GLUCOSE, CAPILLARY
Glucose-Capillary: 166 mg/dL — ABNORMAL HIGH (ref 70–99)
Glucose-Capillary: 129 mg/dL — ABNORMAL HIGH (ref 70–99)
Glucose-Capillary: 121 mg/dL — ABNORMAL HIGH (ref 70–99)
Glucose-Capillary: 181 mg/dL — ABNORMAL HIGH (ref 70–99)

## 2013-04-30 LAB — PREPARE RBC (CROSSMATCH)

## 2013-04-30 MED ORDER — SODIUM CHLORIDE 0.9 % IJ SOLN
10.0000 mL | Freq: Two times a day (BID) | INTRAMUSCULAR | Status: DC
  Administered 2013-04-30 – 2013-05-05 (×10): 10 mL
  Administered 2013-05-06: 20 mL
  Administered 2013-05-06 – 2013-05-07 (×2): 10 mL
  Administered 2013-05-07: 20 mL
  Administered 2013-05-08 – 2013-05-09 (×4): 10 mL

## 2013-04-30 MED ORDER — MAGNESIUM SULFATE 40 MG/ML IJ SOLN
2.0000 g | Freq: Once | INTRAMUSCULAR | Status: AC
  Administered 2013-04-30: 2 g via INTRAVENOUS
  Filled 2013-04-30: qty 50

## 2013-04-30 MED ORDER — FILGRASTIM 480 MCG/1.6ML IJ SOLN
780.0000 ug | Freq: Every day | INTRAMUSCULAR | Status: DC
  Administered 2013-04-30 – 2013-05-05 (×6): 780 ug via SUBCUTANEOUS
  Filled 2013-04-30 (×8): qty 3.2

## 2013-04-30 MED ORDER — ENSURE COMPLETE PO LIQD
237.0000 mL | Freq: Two times a day (BID) | ORAL | Status: DC
  Administered 2013-05-04 – 2013-05-08 (×6): 237 mL via ORAL

## 2013-04-30 MED ORDER — SODIUM CHLORIDE 0.9 % IV SOLN
INTRAVENOUS | Status: DC
  Administered 2013-04-30: 19:00:00 via INTRAVENOUS

## 2013-04-30 MED ORDER — FUROSEMIDE 10 MG/ML IJ SOLN
20.0000 mg | Freq: Once | INTRAMUSCULAR | Status: AC
  Administered 2013-04-30: 20 mg via INTRAVENOUS
  Filled 2013-04-30: qty 2

## 2013-04-30 MED ORDER — FAT EMULSION 20 % IV EMUL
250.0000 mL | INTRAVENOUS | Status: AC
  Administered 2013-04-30: 250 mL via INTRAVENOUS
  Filled 2013-04-30: qty 250

## 2013-04-30 MED ORDER — INSULIN ASPART 100 UNIT/ML ~~LOC~~ SOLN
0.0000 [IU] | SUBCUTANEOUS | Status: DC
  Administered 2013-04-30: 2 [IU] via SUBCUTANEOUS
  Administered 2013-04-30: 3 [IU] via SUBCUTANEOUS
  Administered 2013-05-01: 2 [IU] via SUBCUTANEOUS
  Administered 2013-05-01: 8 [IU] via SUBCUTANEOUS
  Administered 2013-05-01: 3 [IU] via SUBCUTANEOUS
  Administered 2013-05-01: 2 [IU] via SUBCUTANEOUS
  Administered 2013-05-01: 5 [IU] via SUBCUTANEOUS
  Administered 2013-05-01 – 2013-05-02 (×2): 3 [IU] via SUBCUTANEOUS
  Administered 2013-05-02 (×2): 5 [IU] via SUBCUTANEOUS
  Administered 2013-05-02: 3 [IU] via SUBCUTANEOUS
  Administered 2013-05-02 (×2): 5 [IU] via SUBCUTANEOUS
  Administered 2013-05-03: 3 [IU] via SUBCUTANEOUS
  Administered 2013-05-03: 5 [IU] via SUBCUTANEOUS
  Administered 2013-05-03 – 2013-05-04 (×6): 3 [IU] via SUBCUTANEOUS
  Administered 2013-05-04 (×2): 5 [IU] via SUBCUTANEOUS
  Administered 2013-05-04 – 2013-05-05 (×5): 3 [IU] via SUBCUTANEOUS

## 2013-04-30 MED ORDER — SODIUM CHLORIDE 0.9 % IJ SOLN
10.0000 mL | INTRAMUSCULAR | Status: DC | PRN
  Administered 2013-05-06: 10 mL

## 2013-04-30 MED ORDER — POTASSIUM CHLORIDE 10 MEQ/50ML IV SOLN
10.0000 meq | INTRAVENOUS | Status: AC
  Administered 2013-04-30 (×6): 10 meq via INTRAVENOUS
  Filled 2013-04-30 (×6): qty 50

## 2013-04-30 MED ORDER — ACETAMINOPHEN 325 MG PO TABS
650.0000 mg | ORAL_TABLET | Freq: Once | ORAL | Status: AC
  Administered 2013-04-30: 650 mg via ORAL
  Filled 2013-04-30: qty 2

## 2013-04-30 MED ORDER — TRACE MINERALS CR-CU-F-FE-I-MN-MO-SE-ZN IV SOLN
INTRAVENOUS | Status: AC
  Administered 2013-04-30: 18:00:00 via INTRAVENOUS
  Filled 2013-04-30: qty 1000

## 2013-04-30 NOTE — Progress Notes (Signed)
Please see today's pharmacy consult note (forTNA initiation) from Montel Clock, PharmD.  Clayburn Pert, PharmD, BCPS Pager: [phone] 04/30/2013  10:49 AM

## 2013-04-30 NOTE — Progress Notes (Signed)
NUTRITION FOLLOW UP  Intervention:    TPN per PharmD  Patient is at risk for refeeding syndrome as patient is malnourished. Recommend monitoring potassium, magnesium, and phosphorus for 48 hours  Discontinue Resource Breeze as supplement is acidic and may cause further irritation of mouth sores  Chocolate Ensure Complete BID, each supplement provides 350 kcal and 13 grams protein  Encouraged patient to eat as able and provided patient's wife with recommendations for different PO food options  Recommend appetite stimulant to increase PO intake  Will continue to monitor  Nutrition Dx:   Inadequate oral intake related to abd pain/nausea as evidenced by PO intake <75% for 7 days, ongoing  Goal:   Pt to meet >/= 90% of their estimated nutrition needs, currently unmet  Monitor:   Total protein/energy intake, labs, weights, GI profile, TPN initiation, diet advancement  Assessment:   With a hx of DM and NHL s/p recent admission for chemo 4 days ago who presents to the ED with complaints of worsening generalized weakness and being unable to ambulate on his own. The patient also reported nausea with decreased appetite. In the Ed, the patient was noted to have dry mucus membranes. Renal function was normal. The patient was noted again to be unable to stand in the ED.  From previous nutrition note on 3/9: -Pt reported minimal intake for past 7 days d/t abd pain. Unable to tolerate any solids or liquids. Confirmed minimal appetite prior to last week period. PO intake ranged from 25-100% during previous admit in 2/28  -Received chemo treatments 4 days ago  -Was drinking chocolate Ensure BID but has been unable to to tolerate  -On clear liquid diet. Pt with 0% PO intake. Tried to eat broth and jello but this caused more nausea and abd pain -Pt reported usual body weight around 155 lbs. Felt that he has lost weight in past week from minimal PO intake. Current weight may not be accurate d/t  edemas -Pt cannot tolerate aspartame. Causes loose stools per wife  Per Oncology, patient started on TPN today, 3/13.  Patient is an appropriate candidate for TPN as patient with prolonged nausea, vomiting, and diarrhea and altered GI function.    Per conversation with pharmacist, Clinimix-E 5/15 will start tonight at 40 ml/hr with 20% lipids at 10 ml/hr to provide 1162 kcal (55% of minimum estimated needs) and 48 grams protein (56% of minimum estimated needs). As tolerated, rate will increase to goal of 80 ml/hr to provide 1843 kcal (88% of minimum estimated needs) and 96 grams protein (100% of minimum estimated needs). If glucose control is reasonable and PO intake is still minimal will then transition to Clinimix-E 5/20 at 80 ml/hr with 20% lipids at 10 ml/hr (provides 2170 kcal-103% of minimum estimated needs- and 96 grams protein-113% of minimum estimated needs) to better meet calorie needs. Insulin will be be added to TPN and adjusted as needed. Refeeding labs to be monitored.   Patient was sleeping upon dietetic intern visit. RD spoke with patient's wife at bedside. Patients wife stated that patient can not tolerate any food or liquids other than sips of water due to mouth sores that extend down his esophagus. Wife reported encouraging patient to try different PO supplements and foods (jello, broths). Wife stated that patient is unable to tolerate and will only take 1 bite. RD offered the option to get patient other foods available in the cafeteria and provided aspartame free Boost Glucose Control beverage for patient to try once  awake.   Recommend initiating appetite stimulant so that once mouth sores begin healing, patient will want to start eating a PO diet. Pharmacy contacted MD, who reported that appetite stimulant may be consider. Will also modify Ritalin as this may also suppress appetite  Potassium low at 2.9 mEq/L, receiving potassium chloride No new magnesium and phosphorus  labs Calcium low at 7.8 mg/dL CBGs elevated 179 mg/dL, 162 mg/dL, and 166 mg/dL  Pt continues to meet criteria for severe MALNUTRITION in the context of chronic illness as evidenced by PO intake <75% est nutrition needs for > one month, severe muscle wasting and moderate subcutaneous fat loss.  Height: Ht Readings from Last 1 Encounters:  04/25/13 5\' 9"  (1.753 m)    Weight Status:   Wt Readings from Last 1 Encounters:  04/25/13 168 lb 14.4 oz (76.613 kg)  04/14/13            155 lb (70.3 kg) -Wt from 3/8 is falsely elevated due to edema  Re-estimated needs:  Kcal: 2100-2300  Protein: 85-100 gram  Fluid: >/=2100 ml/daily  Skin: no wounds  Diet Order: General   Intake/Output Summary (Last 24 hours) at 04/30/13 0914 Last data filed at 04/30/13 0600  Gross per 24 hour  Intake 6934.7 ml  Output   1625 ml  Net 5309.7 ml    Last BM: 3/12   Labs:   Recent Labs Lab 04/28/13 0500 04/29/13 0525 04/30/13 0530  NA 132* 138 138  K 2.9* 3.2* 2.9*  CL 98 104 105  CO2 17* 18* 20  BUN 9 12 16   CREATININE 0.59 0.56 0.52  CALCIUM 7.7* 7.9* 7.8*  MG 1.4*  --   --   GLUCOSE 171* 184* 159*    CBG (last 3)   Recent Labs  04/29/13 1657 04/29/13 2124 04/30/13 0741  GLUCAP 179* 162* 166*    Scheduled Meds: . acetaminophen  650 mg Oral Once  . acyclovir  5 mg/kg Intravenous 3 times per day  . artificial tears   Right Eye 3 times per day  . chlorhexidine  10 mL Mouth/Throat QID  . docusate sodium  100 mg Oral BID  . feeding supplement (RESOURCE BREEZE)  1 Container Oral BID BM  . fentaNYL  12.5 mcg Transdermal Q72H  . filgrastim  780 mcg Subcutaneous Daily  . furosemide  20 mg Intravenous Once  . imipenem-cilastatin  500 mg Intravenous Q6H  . insulin aspart  0-15 Units Subcutaneous 6 times per day  . insulin glargine  25 Units Subcutaneous QHS  . magnesium sulfate 1 - 4 g bolus IVPB  2 g Intravenous Once  . methylphenidate  5 mg Oral TID WC  . metoprolol tartrate   25 mg Oral BID  . pantoprazole (PROTONIX) IV  40 mg Intravenous Q12H  . potassium chloride  10 mEq Intravenous Q1 Hr x 6  . saccharomyces boulardii  250 mg Oral BID  . sodium bicarbonate/sodium chloride  1 application Mouth Rinse 6 X Daily    Continuous Infusions: . sodium chloride 100 mL/hr at 04/29/13 2237  . sodium chloride    . Marland KitchenTPN (CLINIMIX-E) Adult     And  . fat emulsion      Claudell Kyle, Dietetic Intern Pager: [phone]  I have reviewed and agree with nutrition follow up completed by Claudell Kyle, [place] Clinical Dietitian [phone]

## 2013-04-30 NOTE — Progress Notes (Signed)
OT Cancellation Note  Patient Details Name: Keith Duffy MRN: [id number] DOB: [birth year]   Cancelled Treatment:    Reason Eval/Treat Not Completed: Medical issues which prohibited therapy.  Will sign off at this time.  Please reorder when pt can benefit.  Thank you.  Meldrick Buttery 04/30/2013, 10:08 AM Lesle Chris, OTR/L [phone] 04/30/2013

## 2013-04-30 NOTE — Progress Notes (Signed)
PARENTERAL NUTRITION CONSULT NOTE - INITIAL  Pharmacy Consult for TNA Indication: malnutrition in setting of Non-Hodgkin's Lymphoma with abd pain, nausea, and oral mucositis  Allergies  Allergen Reactions  . Aspartame And Phenylalanine Diarrhea  . Codeine Other (See Comments)    "Made me go wild when I was young"    Patient Measurements: Height: 5\' 9"  (175.3 cm) Weight: 168 lb 14.4 oz (76.613 kg) IBW/kg (Calculated) : 70.7 . Vital Signs: Temp: 99.3 F (37.4 C) (03/13 0541) Temp src: Axillary (03/13 0541) BP: 144/82 mmHg (03/13 0541) Pulse Rate: 87 (03/13 0541) Intake/Output from previous day: 03/12 0701 - 03/13 0700 In: 7054.7 [P.O.:240; I.V.:6400; IV Piggyback:414.7] Out: 1625 [Urine:1625] Intake/Output from this shift:    Labs:  Recent Labs  04/28/13 0500 04/29/13 0525 04/30/13 0530  WBC 0.1* 0.1* 0.1*  HGB 7.9* 7.4* 6.9*  HCT 21.9* 20.5* 19.2*  PLT 7* 25* 17*     Recent Labs  04/28/13 0500 04/29/13 0525 04/30/13 0530  NA 132* 138 138  K 2.9* 3.2* 2.9*  CL 98 104 105  CO2 17* 18* 20  GLUCOSE 171* 184* 159*  BUN 9 12 16   CREATININE 0.59 0.56 0.52  CALCIUM 7.7* 7.9* 7.8*  MG 1.4*  --   --   PROT 4.6* 4.7* 4.5*  ALBUMIN 2.1* 2.0* 1.8*  AST 8 7 9   ALT 9 10 10   ALKPHOS 50 54 53  BILITOT 1.1 0.8 0.7   Estimated Creatinine Clearance: 92.1 ml/min (by C-G formula based on Cr of 0.52).    Recent Labs  04/29/13 1657 04/29/13 2124 04/30/13 0741  GLUCAP 179* 162* 166*    Medical History: Past Medical History  Diagnosis Date  . Back pain   . Hypertension   . Diabetes mellitus without complication   . Cancer     non hodkins lymphoma    Medications:  Scheduled:  . acetaminophen  650 mg Oral Once  . acyclovir  5 mg/kg Intravenous 3 times per day  . artificial tears   Right Eye 3 times per day  . chlorhexidine  10 mL Mouth/Throat QID  . docusate sodium  100 mg Oral BID  . feeding supplement (RESOURCE BREEZE)  1 Container Oral BID BM  .  fentaNYL  12.5 mcg Transdermal Q72H  . filgrastim  780 mcg Subcutaneous Daily  . furosemide  20 mg Intravenous Once  . imipenem-cilastatin  500 mg Intravenous Q6H  . insulin aspart  0-15 Units Subcutaneous 6 times per day  . insulin glargine  25 Units Subcutaneous QHS  . magnesium sulfate 1 - 4 g bolus IVPB  2 g Intravenous Once  . methylphenidate  5 mg Oral TID WC  . metoprolol tartrate  25 mg Oral BID  . pantoprazole (PROTONIX) IV  40 mg Intravenous Q12H  . potassium chloride  10 mEq Intravenous Q1 Hr x 6  . saccharomyces boulardii  250 mg Oral BID  . sodium bicarbonate/sodium chloride  1 application Mouth Rinse 6 X Daily   Infusions:  . sodium chloride 100 mL/hr at 04/29/13 2237  . sodium chloride    . Marland KitchenTPN (CLINIMIX-E) Adult     And  . fat emulsion     PRN: acetaminophen, acetaminophen, LORazepam, morphine injection, ondansetron (ZOFRAN) IV, ondansetron, oxyCODONE, polyethylene glycol, prochlorperazine, sorbitol, tamsulosin  Insulin Requirements in the past 24 hours:   Patient has h/o insulin-dependent DM.  Novolog 12 sliding scale coverage (receives moderate-scale coverage ac & hs)  Also has Lantus 25 units SQ ordered  daily at bedtime as PTA but patient has been refusing this medication since admission.  Thrombocytopenia noted  Received corticosteroid (SoluMedrol 125mg ) x 1 dose on 3/11.   Current Nutrition:  Regular diet - reportedly taking minimal amounts Resource Breeze BID - not charted since 3/10  Maintenance IVF: NS at 100 mL/hr  Nutritional Goals: (per RD assessment 3/9):  2100 - 2300 KCal/day, 85 - 100 grams protein/day, Fluid >/= 2100 ml/day  TNA Access: Central via implanted port.   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  Malnourished per RD assessment 3/9 and oral intake is reportedly  still poor secondary to abdominal discomfort and possibly oral mucositis.  History of insulin-dependent DM noted.  Achievement of full recommended caloric intake using TNA alone while still maintaining reasonable glucose control will be challenging.     K low:   Replenishment already ordered by attending MD  (KCl 66mEq IV q1h x 6).    Mg borderline on 3/11 at 1.4.   CBGs 162-191 in past 24 hours on sliding scale Novolog.   Anticpate CBGs will rise further on TNA and additional insulin will be needed.  Plan:  1. Magnesium Sulfate 2 grams IV x 1 today. 2. Change CBGs to q4h with Novolog moderate-scale coverage. 3. Encourage patient to allow administration of his ordered Lantus doses starting tonight after TNA begins. 4. At 1800 tonight:  -Begin Clinimix-E 5/15 at 40 mL/hr.  (Using lower-carbohydrate formula than E 5/20 to help keep sugars at a reasonable level).  -Add regular insulin 10 units/L of TNA  -Multivitamins in TNA daily  -Trace Elements in TNA MWF only (due to national shortage)  -Begin Fat Emulsion 20% at 10 mL/hr  -Reduce maintenance IVF to 50 mL/hr 5. As tolerated over the next few days, increase Clinimix rate to goal of 80 mL/hr.  Along with fat emulsion, this will deliver 96 grams protein/day, 1843 KCal/day.  -If glucose control is reasonable and PO intake is minimal, can then consider change to Clinimix-E 5/20 at same rate to meet caloric goals as outline by RD. 6. Follow CBGs and adjust insulin in TNA and sliding scale coverage as needed. 7. Full TNA labs tomorrow, then Mondays and Fridays  -Watch K, Mg, Phos.  Anticipate need for repletion as TNA begins and rate advances. 8. Follow tolerance of PO diet.  Await input and guidance from RD. 9. Pharmacy will follow TNA daily.  Thank you for this Consult!  Montel Clock, PharmD Phone: [phone]  04/30/2013  10:30 AM

## 2013-04-30 NOTE — Progress Notes (Signed)
Physical Therapy Discharge Patient Details Name: Keith Duffy MRN: [id number] DOB: Oct 04, 1945 Today's Date: 04/30/2013 Time:  -     Patient discharged from PT services secondary to medical decline - will need to re-order PT to resume therapy services.Patient has not had an evaluation   Progress and discharge plan discussed with patient and/or caregiver: N/A GP     Marcelino Freestone PT [phone]  04/30/2013, 7:56 AM

## 2013-04-30 NOTE — Progress Notes (Signed)
[name] still looks quite weak. His white cell count still 0.1. I will increase his Epogen dose to 780 mcg.  His hemoglobin is 6.9. I will transfuse him 2 units of blood today.  Is not eating. We were going to have to start him on parenteral nutrition. We will get pharmacy to manage this for [place].  Is not hurting. It was not taking any pain medication.  Cultures so far have still been negative.  He would not be able to get out of bed. He just is very weak. A lot of this is all reflective of his profound and prolonged neutropenia.  He is on Primaxin. He's been afebrile.  He's had no emesis. There is still some diarrhea.  On his physical exam, temperature 99.3. Pulse 87. Blood pressure 144/82. Oxygen saturation 100%. Head and neck exam shows some slightly improved oral mucosa. 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 No adenopathy in the neck. Lungs are clear. Cardiac exam regular in rhythm. No murmurs rubs or bruits. Abdomen is soft. Has good bowel sounds. There is no fluid wave. There is no guarding or rebound tenderness. Extremities shows muscle atrophy in upper and lower extremities. Skin exam shows scattered ecchymoses. 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  Again, the neutropenia is just lasting a lot longer than I thought. This really is causing her a lot of problems. With his immune system being so suppressed, it is really affecting his whole body.  We will try to "force the issue" and increase his Neupogen. I will transfuse him 2 units of blood. Platelet count is 17 so we do not have to transfuse.  His potassium is still low at 2.9. We will replace this.  TNA will start today.  I had a long talk with his wife this morning. She will do her best to try to help him. The staff on previous to is also doing a great job. She is very appreciative of there efforts.  Keith  E.  Duffy 8:28

## 2013-04-30 NOTE — Progress Notes (Signed)
CRITICAL VALUE ALERT  Critical value received:  K 2.9  Date of notification:  04/30/13  Time of notification:  0614  Critical value read back: yes  Nurse who received alert:  Wilhemina Cash, RN  MD notified (1st page):  Oncology  Time of first page: 0606  MD notified (2nd page):  Time of second page:  Responding MD: On call provider  Time MD responded:  [phone]

## 2013-04-30 NOTE — Progress Notes (Signed)
Peripherally Inserted Central Catheter/Midline Placement  The IV Nurse has discussed with the patient and/or persons authorized to consent for the patient, the purpose of this procedure and the potential benefits and risks involved with this procedure.  The benefits include less needle sticks, lab draws from the catheter and patient may be discharged home with the catheter.  Risks include, but not limited to, infection, bleeding, blood clot (thrombus formation), and puncture of an artery; nerve damage and irregular heat beat.  Alternatives to this procedure were also discussed.  PICC/Midline Placement Documentation        Keith Duffy 04/30/2013, 12:39 PM

## 2013-04-30 NOTE — Progress Notes (Signed)
CRITICAL VALUE ALERT  Critical value received: WBC 0.1, Hgb 6.9, Platelets 17  Date of notification:  04/30/13  Time of notification:  0558  Critical value read back: yes  Nurse who received alert:  Otho Bellows, RN  MD notified (1st page):  Oncology  Time of first page: 0606  MD notified (2nd page):  Time of second page:  Responding MD:  On call provider  Time MD responded: [phone]

## 2013-05-01 LAB — CBC WITH DIFFERENTIAL/PLATELET
BASOS PCT: 0 % (ref 0–1)
Band Neutrophils: 0 % (ref 0–10)
Basophils Absolute: 0 10*3/uL (ref 0.0–0.1)
Eosinophils Absolute: 0 10*3/uL (ref 0.0–0.7)
Eosinophils Relative: 0 % (ref 0–5)
HEMATOCRIT: 26.4 % — AB (ref 39.0–52.0)
Hemoglobin: 9.5 g/dL — ABNORMAL LOW (ref 13.0–17.0)
Lymphocytes Relative: 0 % — ABNORMAL LOW (ref 12–46)
Lymphs Abs: 0 10*3/uL — ABNORMAL LOW (ref 0.7–4.0)
MCH: 30.4 pg (ref 26.0–34.0)
MCHC: 36 g/dL (ref 30.0–36.0)
MCV: 84.3 fL (ref 78.0–100.0)
MONO ABS: 0 10*3/uL — AB (ref 0.1–1.0)
MONOS PCT: 0 % — AB (ref 3–12)
NEUTROS PCT: 0 % — AB (ref 43–77)
Platelets: 9 10*3/uL — CL (ref 150–400)
RBC: 3.13 MIL/uL — AB (ref 4.22–5.81)
RDW: 15.4 % (ref 11.5–15.5)
WBC: 0.2 10*3/uL — CL (ref 4.0–10.5)
nRBC: 0 /100 WBC

## 2013-05-01 LAB — COMPREHENSIVE METABOLIC PANEL
ALK PHOS: 67 U/L (ref 39–117)
ALT: 14 U/L (ref 0–53)
AST: 12 U/L (ref 0–37)
Albumin: 1.9 g/dL — ABNORMAL LOW (ref 3.5–5.2)
BUN: 12 mg/dL (ref 6–23)
CALCIUM: 7.9 mg/dL — AB (ref 8.4–10.5)
CO2: 22 meq/L (ref 19–32)
Chloride: 102 mEq/L (ref 96–112)
Creatinine, Ser: 0.46 mg/dL — ABNORMAL LOW (ref 0.50–1.35)
GFR calc Af Amer: >90 mL/min (ref >90)
Glucose, Bld: 134 mg/dL — ABNORMAL HIGH (ref 70–99)
POTASSIUM: 2.5 meq/L — AB (ref 3.7–5.3)
Sodium: 136 mEq/L — ABNORMAL LOW (ref 137–147)
TOTAL PROTEIN: 4.9 g/dL — AB (ref 6.0–8.3)
Total Bilirubin: 0.8 mg/dL (ref 0.3–1.2)

## 2013-05-01 LAB — TYPE AND SCREEN
ABO/RH(D): O POS
Antibody Screen: NEGATIVE
Unit division: 0
Unit division: 0

## 2013-05-01 LAB — GLUCOSE, CAPILLARY
GLUCOSE-CAPILLARY: 247 mg/dL — AB (ref 70–99)
Glucose-Capillary: 124 mg/dL — ABNORMAL HIGH (ref 70–99)
Glucose-Capillary: 147 mg/dL — ABNORMAL HIGH (ref 70–99)
Glucose-Capillary: 152 mg/dL — ABNORMAL HIGH (ref 70–99)
Glucose-Capillary: 170 mg/dL — ABNORMAL HIGH (ref 70–99)
Glucose-Capillary: 265 mg/dL — ABNORMAL HIGH (ref 70–99)

## 2013-05-01 LAB — PHOSPHORUS: PHOSPHORUS: 2.4 mg/dL (ref 2.3–4.6)

## 2013-05-01 LAB — PREALBUMIN: Prealbumin: 10.7 mg/dL — ABNORMAL LOW (ref 17.0–34.0)

## 2013-05-01 LAB — TRIGLYCERIDES: Triglycerides: 169 mg/dL — ABNORMAL HIGH (ref <150)

## 2013-05-01 LAB — MAGNESIUM: Magnesium: 1.8 mg/dL (ref 1.5–2.5)

## 2013-05-01 MED ORDER — POTASSIUM CHLORIDE 10 MEQ/50ML IV SOLN
10.0000 meq | INTRAVENOUS | Status: AC
  Administered 2013-05-01 (×6): 10 meq via INTRAVENOUS
  Filled 2013-05-01 (×6): qty 50

## 2013-05-01 MED ORDER — INSULIN REGULAR HUMAN 100 UNIT/ML IJ SOLN
INTRAMUSCULAR | Status: AC
Start: 2013-05-01 — End: 2013-05-02
  Administered 2013-05-01: 18:00:00 via INTRAVENOUS
  Filled 2013-05-01: qty 1000

## 2013-05-01 MED ORDER — METHYLPREDNISOLONE SODIUM SUCC 125 MG IJ SOLR
125.0000 mg | Freq: Once | INTRAMUSCULAR | Status: AC
  Administered 2013-05-01: 125 mg via INTRAVENOUS
  Filled 2013-05-01: qty 2

## 2013-05-01 MED ORDER — LIDOCAINE VISCOUS 2 % MT SOLN
15.0000 mL | OROMUCOSAL | Status: DC | PRN
  Administered 2013-05-01 – 2013-05-03 (×2): 15 mL via OROMUCOSAL
  Filled 2013-05-01 (×2): qty 15

## 2013-05-01 MED ORDER — M.V.I. ADULT IV INJ
INTRAVENOUS | Status: DC
  Filled 2013-05-01: qty 1000

## 2013-05-01 MED ORDER — ACETAMINOPHEN 325 MG PO TABS
650.0000 mg | ORAL_TABLET | Freq: Once | ORAL | Status: AC
  Administered 2013-05-01: 650 mg via ORAL
  Filled 2013-05-01: qty 2

## 2013-05-01 MED ORDER — FAT EMULSION 20 % IV EMUL
250.0000 mL | INTRAVENOUS | Status: DC
  Filled 2013-05-01: qty 250

## 2013-05-01 MED ORDER — FAT EMULSION 20 % IV EMUL
250.0000 mL | INTRAVENOUS | Status: AC
  Administered 2013-05-01: 250 mL via INTRAVENOUS
  Filled 2013-05-01: qty 250

## 2013-05-01 MED ORDER — POTASSIUM CHLORIDE IN NACL 40-0.9 MEQ/L-% IV SOLN
INTRAVENOUS | Status: DC
  Administered 2013-05-01 – 2013-05-08 (×8): via INTRAVENOUS
  Administered 2013-05-09: 75 mL/h via INTRAVENOUS
  Administered 2013-05-09: via INTRAVENOUS
  Filled 2013-05-01 (×15): qty 1000

## 2013-05-01 NOTE — Progress Notes (Signed)
PARENTERAL NUTRITION CONSULT NOTE - Follow-up  Pharmacy Consult for TNA Indication: malnutrition in setting of Non-Hodgkin's Lymphoma with abd pain, nausea, and oral mucositis  Allergies  Allergen Reactions  . Aspartame And Phenylalanine Diarrhea  . Codeine Other (See Comments)    "Made me go wild when I was young"    Patient Measurements: Height: 5\' 9"  (175.3 cm) Weight: 168 lb 14.4 oz (76.613 kg) IBW/kg (Calculated) : 70.7 . Vital Signs: Temp: 97.9 F (36.6 C) (03/14 0449) Temp src: Axillary (03/14 0449) BP: 142/82 mmHg (03/14 0449) Pulse Rate: 86 (03/14 0449) Intake/Output from previous day: 03/13 0701 - 03/14 0700 In: 3356.9 [I.V.:1403; Blood:657.5; IV Piggyback:723.1; TPN:573.3] Out: 4625 [Urine:4625] Intake/Output from this shift:    Labs:  Recent Labs  04/29/13 0525 04/30/13 0530 05/01/13 0500  WBC 0.1* 0.1* 0.2*  HGB 7.4* 6.9* 9.5*  HCT 20.5* 19.2* 26.4*  PLT 25* 17* 9*     Recent Labs  04/29/13 0525 04/30/13 0530 05/01/13 0500  NA 138 138 136*  K 3.2* 2.9* 2.5*  CL 104 105 102  CO2 18* 20 22  GLUCOSE 184* 159* 134*  BUN 12 16 12   CREATININE 0.56 0.52 0.46*  CALCIUM 7.9* 7.8* 7.9*  MG  --   --  1.8  PHOS  --   --  2.4  PROT 4.7* 4.5* 4.9*  ALBUMIN 2.0* 1.8* 1.9*  AST 7 9 12   ALT 10 10 14   ALKPHOS 54 53 67  BILITOT 0.8 0.7 0.8  TRIG  --   --  169*   Estimated Creatinine Clearance: 92.1 ml/min (by C-G formula based on Cr of 0.46).    Recent Labs  04/30/13 2130 05/01/13 0027 05/01/13 0441  GLUCAP 181* 170* 124*    Medical History: Past Medical History  Diagnosis Date  . Back pain   . Hypertension   . Diabetes mellitus without complication   . Cancer     non hodkins lymphoma    Medications:  Scheduled:  . acetaminophen  650 mg Oral Once  . acyclovir  5 mg/kg Intravenous 3 times per day  . artificial tears   Right Eye 3 times per day  . chlorhexidine  10 mL Mouth/Throat QID  . docusate sodium  100 mg Oral BID  . feeding  supplement (ENSURE COMPLETE)  237 mL Oral BID BM  . fentaNYL  12.5 mcg Transdermal Q72H  . filgrastim  780 mcg Subcutaneous Daily  . imipenem-cilastatin  500 mg Intravenous Q6H  . insulin aspart  0-15 Units Subcutaneous 6 times per day  . methylphenidate  5 mg Oral TID WC  . methylPREDNISolone (SOLU-MEDROL) injection  125 mg Intravenous Once  . metoprolol tartrate  25 mg Oral BID  . pantoprazole (PROTONIX) IV  40 mg Intravenous Q12H  . potassium chloride  10 mEq Intravenous Q1 Hr x 6  . saccharomyces boulardii  250 mg Oral BID  . sodium bicarbonate/sodium chloride  1 application Mouth Rinse 6 X Daily  . sodium chloride  10-40 mL Intracatheter Q12H   Infusions:  . sodium chloride 50 mL/hr at 04/30/13 1858  . Marland KitchenTPN (CLINIMIX-E) Adult 40 mL/hr at 04/30/13 1732   And  . fat emulsion 250 mL (04/30/13 1732)   PRN: acetaminophen, acetaminophen, lidocaine, LORazepam, morphine injection, ondansetron (ZOFRAN) IV, ondansetron, oxyCODONE, polyethylene glycol, prochlorperazine, sodium chloride, sorbitol, tamsulosin  Insulin Requirements in the past 24 hours:  10 units moderate SSI, lantus 25 units QHS x 1 (not d/c'd), 10 units/L insulin in TNA.  Solu-meddrol 125mg  x 1 ordered for 3/14.   Current Nutrition:  Regular diet - reportedly taking minimal amounts Ensure complete between meals: not charted  Maintenance IVF: NS at 50 mL/hr  Nutritional Goals: (per RD assessment 3/9):  2100 - 2300 KCal/day, 85 - 100 grams protein/day, Fluid >/= 2100 ml/day  TNA Access: Central via implanted port.  Renal: At risk for re-feeding syndrome.  K decreased to 2.5 (MD replaced with 6 runs KCl 93mEq/50mL), Na down to 136.  Corr Ca 9.58.  Mg ok after 2g replacement 3/13. SCr low, CrCl 92.   LFTs: WNL Endo: Hx insulin dependent DM.  CBGs 124-181.  Pt previously refusing lantus, but received dose last night.  Order now d/c'd by MD.  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  Malnourished per RD assessment 3/9 and oral intake is reportedly still poor secondary to abdominal discomfort and possibly oral mucositis.  Noted hx of insulin-dependent DM. Achievement of full recommended caloric intake using TNA alone while still maintaining reasonable glucose control will be challenging. Anticpate CBGs will rise further on TNA and additional insulin will be needed.  Will maintain current rate of TNA due to risk of re-feeding syndrome and wait to titrate up until electrolytes are stable.  Noted MD's request to add potassium to TNA, unfortunately unable to adjust electrolytes with current TNA products.  Clinimix E5/15 contains potassium 71mEq/L.    Plan:  At 1800 tonight:  -Continue Clinimix-E 5/15 at 40 mL/hr.  (Using lower-carbohydrate formula than E 5/20 to help keep sugars at a reasonable level).  Titrate as tolerated over several days to goal rate E5/20 Clinimix @ 78ml/hr -Continue regular insulin 10 units/L of TNA  -Multivitamins in TNA daily  -Trace Elements in TNA MWF only (due to national shortage)  -Fat Emulsion 20% at 10 mL/hr  -F/u glucose control closely. When controlled, consider changing to Clinimix-E 5/20 at same rate to meet caloric goals as outline by RD.  Adjust insulin in TNA and sliding scale coverage as needed.  -Check K, Mg, Phos tomorrow and f/u closely.  Anticipate need for repletion as TNA begins and rate advances.  -Add potassium 45mEq/L to NS.      Ralene Bathe, PharmD, BCPS 05/01/2013, 9:41 AM  Pager: [phone]

## 2013-05-01 NOTE — Progress Notes (Signed)
Mr. Bowdish is about the same. He now is on TNA. His mouth has no mucositis. This is reflective of his profound neutropenia.  His white cell count is now 0.2. Hopefully this is a sign that he is starting to recover his white cells. We did increase his Neupogen dose.  His platelets are 9000. We are will give him a unit of platelets.  He's not hurting. His mouth is quite sore. We'll see he have some viscous lidocaine swab onto his mucosal lining in the mouth.  He's had no diarrhea.  There's been no fever. He continues on broad spectrum IV antibiotic coverage.  Vital signs are temperature of 97.9. Pulse 86. Blood pressure 142/82. Oral exam shows the mucositis. It is probably grade 3. Lungs are clear. Cardiac exam regular in rhythm with no murmurs rubs or bruits. Abdomen is soft. Bowel sounds somewhat decreased. There is no fluid wave. There is no palpable hepato- splenomegaly. Extremities shows the muscle atrophy in upper and lower extremities. Neurological exam shows the right facial nerve deficit. He doesn't left foot drop. Skin exam shows scattered ecchymoses and petechia.  His potassium is 2.5. We will give extra potassium. His hemoglobin is 9.5.  Hopefully, we now are seeing a "glimmer" with his white cell count up a little. We will see how this turns over the weekend.  I did speak to him about end-of-life issues. I talked to him about life support. He does understand what I was saying. His wife is with him. He says he will have to think about it. For now, he is full code.  Keith E.  [name]

## 2013-05-02 LAB — CBC WITH DIFFERENTIAL/PLATELET
BASOS ABS: 0 10*3/uL (ref 0.0–0.1)
Basophils Relative: 0 % (ref 0–1)
EOS PCT: 0 % (ref 0–5)
Eosinophils Absolute: 0 10*3/uL (ref 0.0–0.7)
HEMATOCRIT: 27 % — AB (ref 39.0–52.0)
Hemoglobin: 9.7 g/dL — ABNORMAL LOW (ref 13.0–17.0)
LYMPHS ABS: 0.1 10*3/uL — AB (ref 0.7–4.0)
Lymphocytes Relative: 52 % — ABNORMAL HIGH (ref 12–46)
MCH: 30.4 pg (ref 26.0–34.0)
MCHC: 35.9 g/dL (ref 30.0–36.0)
MCV: 84.6 fL (ref 78.0–100.0)
MONO ABS: 0.1 10*3/uL (ref 0.1–1.0)
MONOS PCT: 38 % — AB (ref 3–12)
NEUTROS ABS: 0 10*3/uL — AB (ref 1.7–7.7)
Neutrophils Relative %: 10 % — ABNORMAL LOW (ref 43–77)
PLATELETS: 35 10*3/uL — AB (ref 150–400)
RBC: 3.19 MIL/uL — ABNORMAL LOW (ref 4.22–5.81)
RDW: 15.2 % (ref 11.5–15.5)
WBC: 0.2 10*3/uL — CL (ref 4.0–10.5)

## 2013-05-02 LAB — COMPREHENSIVE METABOLIC PANEL
ALT: 31 U/L (ref 0–53)
AST: 24 U/L (ref 0–37)
Albumin: 2 g/dL — ABNORMAL LOW (ref 3.5–5.2)
Alkaline Phosphatase: 140 U/L — ABNORMAL HIGH (ref 39–117)
BILIRUBIN TOTAL: 0.7 mg/dL (ref 0.3–1.2)
BUN: 18 mg/dL (ref 6–23)
CALCIUM: 8.2 mg/dL — AB (ref 8.4–10.5)
CHLORIDE: 103 meq/L (ref 96–112)
CO2: 22 meq/L (ref 19–32)
Creatinine, Ser: 0.46 mg/dL — ABNORMAL LOW (ref 0.50–1.35)
GFR calc Af Amer: >90 mL/min (ref >90)
Glucose, Bld: 228 mg/dL — ABNORMAL HIGH (ref 70–99)
Potassium: 3.7 mEq/L (ref 3.7–5.3)
Sodium: 137 mEq/L (ref 137–147)
Total Protein: 4.9 g/dL — ABNORMAL LOW (ref 6.0–8.3)

## 2013-05-02 LAB — GLUCOSE, CAPILLARY
GLUCOSE-CAPILLARY: 229 mg/dL — AB (ref 70–99)
GLUCOSE-CAPILLARY: 193 mg/dL — AB (ref 70–99)
GLUCOSE-CAPILLARY: 189 mg/dL — AB (ref 70–99)
Glucose-Capillary: 207 mg/dL — ABNORMAL HIGH (ref 70–99)
Glucose-Capillary: 208 mg/dL — ABNORMAL HIGH (ref 70–99)
Glucose-Capillary: 225 mg/dL — ABNORMAL HIGH (ref 70–99)

## 2013-05-02 LAB — PHOSPHORUS: PHOSPHORUS: 2.4 mg/dL (ref 2.3–4.6)

## 2013-05-02 LAB — PREPARE PLATELET PHERESIS: Unit division: 0

## 2013-05-02 LAB — MAGNESIUM: Magnesium: 1.7 mg/dL (ref 1.5–2.5)

## 2013-05-02 MED ORDER — M.V.I. ADULT IV INJ
INTRAVENOUS | Status: AC
  Administered 2013-05-02: 18:00:00 via INTRAVENOUS
  Filled 2013-05-02: qty 2000

## 2013-05-02 MED ORDER — FAT EMULSION 20 % IV EMUL
250.0000 mL | INTRAVENOUS | Status: AC
  Administered 2013-05-02: 250 mL via INTRAVENOUS
  Filled 2013-05-02: qty 250

## 2013-05-02 MED ORDER — INSULIN GLARGINE 100 UNIT/ML ~~LOC~~ SOLN
25.0000 [IU] | Freq: Every day | SUBCUTANEOUS | Status: DC
  Administered 2013-05-02 – 2013-05-09 (×8): 25 [IU] via SUBCUTANEOUS
  Filled 2013-05-02 (×9): qty 0.25

## 2013-05-02 NOTE — Progress Notes (Signed)
  IP PROGRESS NOTE  Subjective:   Patient reports a restful night. He has not reported any issues overnight. He has not reported any bleeding or fevers. Has not reported any nausea or vomiting or abdominal pain. He still has some difficulties with swallowing and TNA is going without complications.  Objective:  Vital signs in last 24 hours: Temp:  [97.3 F (36.3 C)-98.4 F (36.9 C)] 97.6 F (36.4 C) (03/15 0441) Pulse Rate:  [78-103] 78 (03/15 0441) Resp:  [18] 18 (03/15 0441) BP: (141-154)/(72-88) 147/72 mmHg (03/15 0441) SpO2:  [99 %-100 %] 100 % (03/15 0441) Weight change:  Last BM Date: 05/01/13  Intake/Output from previous day: 03/14 0701 - 03/15 0700 In: 1297.9 [P.O.:240; I.V.:442.5; IV Piggyback:615.4] Out: 3525 [Urine:3525]  Mouth: mucous membranes moist, pharynx normal without lesions Resp: clear to auscultation bilaterally Cardio: regular rate and rhythm, S1, S2 normal, no murmur, click, rub or gallop GI: soft, non-tender; bowel sounds normal; no masses,  no organomegaly Extremities: extremities normal, atraumatic, no cyanosis or edema  Portacath/PICC-without erythema  Lab Results:  Recent Labs  05/01/13 0500 05/02/13 0530  WBC 0.2* 0.2*  HGB 9.5* 9.7*  HCT 26.4* 27.0*  PLT 9* 35*    BMET  Recent Labs  05/01/13 0500 05/02/13 0530  NA 136* 137  K 2.5* 3.7  CL 102 103  CO2 22 22  GLUCOSE 134* 228*  BUN 12 18  CREATININE 0.46* 0.46*  CALCIUM 7.9* 8.2*     Medications: I have reviewed the patient's current medications.  Assessment/Plan:  68 year old gentleman with the following issues:  1. Diffuse large cell lymphoma diagnosed in July 2014. He had systemic remission but developed CNS relapse rather quickly. He was straight with high-dose methotrexate with the last treatment was on 04/20/2013. He also received intrathecal chemotherapy. He is currently recovering from that last cycle.  2. Pancytopenia: This is related to his left systemic  chemotherapy. He is currently on Neupogen and his white cell count has not dramatically changed in the last 24 hours. His platelet count is up to 35 after was transfused on 05/01/2013. There will be no need for any blood products today he'll continue on Neupogen.  3. Infectious disease issues: He does not have any fevers at this time no need to change any of his antibiotics.  4. Nutrition: He is currently receiving TNA without complications.  5. Electrolyte balance: His potassium is much improved today at 3.7 does not require any supplementation.  He continues to be full code at this time.   LOS: 7 days   QIWLNL,GXQJJ 05/02/2013, 8:22 AM

## 2013-05-02 NOTE — Progress Notes (Signed)
PARENTERAL NUTRITION CONSULT NOTE - Follow-up  Pharmacy Consult for TNA Indication: malnutrition in setting of Non-Hodgkin's Lymphoma with abd pain, nausea, and oral mucositis  Allergies  Allergen Reactions  . Aspartame And Phenylalanine Diarrhea  . Codeine Other (See Comments)    "Made me go wild when I was young"    Patient Measurements: Height: _0  (175.3 cm) Weight: 168 lb 14.4 oz (76.613 kg) IBW/kg (Calculated) : 70.7 . Vital Signs: Temp: 97.6 F (36.4 C) (03/15 0441) Temp src: Axillary (03/15 0441) BP: 147/72 mmHg (03/15 0441) Pulse Rate: 78 (03/15 0441) Intake/Output from previous day: 03/14 0701 - 03/15 0700 In: 1297.9 [P.O.:240; I.V.:442.5; IV Piggyback:615.4] Out: 3525 [Urine:3525] Intake/Output from this shift:    Labs:  Recent Labs  04/30/13 0530 05/01/13 0500 05/02/13 0530  WBC 0.1* 0.2* 0.2*  HGB 6.9* 9.5* 9.7*  HCT 19.2* 26.4* 27.0*  PLT 17* 9* 35*     Recent Labs  04/30/13 0530 05/01/13 0500 05/02/13 0530  NA 138 136* 137  K 2.9* 2.5* 3.7  CL 105 102 103  CO2 _1 GLUCOSE 159* 134* 228*  BUN _2 CREATININE 0.52 0.46* 0.46*  CALCIUM 7.8* 7.9* 8.2*  MG  --  1.8 1.7  PHOS  --  2.4 2.4  PROT 4.5* 4.9* 4.9*  ALBUMIN 1.8* 1.9* 2.0*  AST _3 ALT _4 ALKPHOS 53 67 140*  BILITOT 0.7 0.8 0.7  PREALBUMIN  --  10.7*  --   TRIG  --  169*  --    Estimated Creatinine Clearance: 92.1 ml/min (by C-G formula based on Cr of 0.46).    Recent Labs  05/02/13 0018 05/02/13 0436 05/02/13 0741  GLUCAP 225* 208* 229*    Medical History: Past Medical History  Diagnosis Date  . Back pain   . Hypertension   . Diabetes mellitus without complication   . Cancer     non hodkins lymphoma    Medications:  Scheduled:  . acyclovir  5 mg/kg Intravenous 3 times per day  . artificial tears   Right Eye 3 times per day  . chlorhexidine  10 mL Mouth/Throat QID  . docusate sodium  100 mg Oral BID  . feeding supplement (ENSURE  COMPLETE)  237 mL Oral BID BM  . fentaNYL  12.5 mcg Transdermal Q72H  . filgrastim  780 mcg Subcutaneous Daily  . imipenem-cilastatin  500 mg Intravenous Q6H  . insulin aspart  0-15 Units Subcutaneous 6 times per day  . methylphenidate  5 mg Oral TID WC  . metoprolol tartrate  25 mg Oral BID  . pantoprazole (PROTONIX) IV  40 mg Intravenous Q12H  . saccharomyces boulardii  250 mg Oral BID  . sodium bicarbonate/sodium chloride  1 application Mouth Rinse 6 X Daily  . sodium chloride  10-40 mL Intracatheter Q12H   Infusions:  . 0.9 % NaCl with KCl 40 mEq / L 50 mL/hr at 05/01/13 1709  . Marland KitchenTPN (CLINIMIX-E) Adult 40 mL/hr at 05/01/13 1737   And  . fat emulsion 250 mL (05/01/13 1738)   PRN: acetaminophen, acetaminophen, lidocaine, LORazepam, morphine injection, ondansetron (ZOFRAN) IV, ondansetron, oxyCODONE, polyethylene glycol, prochlorperazine, sodium chloride, sorbitol, tamsulosin  Insulin Requirements in the past 24 hours:  28 units moderate SSI, 10 units/L insulin in TNA.  Solu-meddrol 165m x 1 on 3/14.   Current Nutrition:  Regular diet - not eating Ensure complete between meals: not charted  Maintenance IVF: NS with  KCl 29mq/L at 50 mL/hr  Nutritional Goals: (per RD assessment 3/9):  2100 - 2300 KCal/day, 85 - 100 grams protein/day, Fluid >/= 2100 ml/day  TNA Access: Central via implanted port.  Renal: Lytes today are improved, K and Na both WNL after supplementation.  LFTs: WNL, alk phos increased to 140 Endo: Hx insulin dependent DM.  CBGs uncontrolled last 24 hours (134-365).  Noted lantus order discontinued, no basal insulin received.   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  Malnourished per RD assessment 3/9 and oral intake is  reportedly still poor secondary to abdominal discomfort and possibly oral mucositis.  Noted hx of insulin-dependent DM. Achievement of full recommended caloric intake using TNA alone while still maintaining reasonable glucose control will be challenging. Anticpate CBGs will rise further on TNA and additional insulin will be needed.   66/45 No complications with TNA per RN.  Lytes improved.  Adjust insulin regimen to try to improve CBGs.    Plan:  At 1800 tonight:  -Increase to Clinimix-E 5/15 at 60 mL/hr.  (Using lower-carbohydrate formula than E 5/20 to help keep sugars at a reasonable level).  Titrate as tolerated over several days to goal rate E5/20 Clinimix @ 852mhr  -Increase to regular insulin 15 units/L of TNA  -Multivitamins in TNA daily  -Trace Elements in TNA MWF only (due to national shortage)  -Fat Emulsion 20% at 10 mL/hr  -Resume lantus 25 units daily   -F/u glucose control closely. When controlled, consider changing to Clinimix-E 5/20 at same rate to meet caloric goals as outline by RD.  Adjust insulin in TNA and sliding scale coverage as needed.  -TNA labs tomorrow  -Continue NS with 4051mL at 102m14m  CollRalene BathearmD, BCPS 05/02/2013, 8:42 AM  Pager: [phone]

## 2013-05-03 LAB — GLUCOSE, CAPILLARY
GLUCOSE-CAPILLARY: 188 mg/dL — AB (ref 70–99)
Glucose-Capillary: 159 mg/dL — ABNORMAL HIGH (ref 70–99)
Glucose-Capillary: 165 mg/dL — ABNORMAL HIGH (ref 70–99)
Glucose-Capillary: 173 mg/dL — ABNORMAL HIGH (ref 70–99)
Glucose-Capillary: 175 mg/dL — ABNORMAL HIGH (ref 70–99)
Glucose-Capillary: 175 mg/dL — ABNORMAL HIGH (ref 70–99)
Glucose-Capillary: 204 mg/dL — ABNORMAL HIGH (ref 70–99)

## 2013-05-03 LAB — COMPREHENSIVE METABOLIC PANEL
ALK PHOS: 137 U/L — AB (ref 39–117)
ALT: 42 U/L (ref 0–53)
AST: 21 U/L (ref 0–37)
Albumin: 2 g/dL — ABNORMAL LOW (ref 3.5–5.2)
BUN: 18 mg/dL (ref 6–23)
CHLORIDE: 101 meq/L (ref 96–112)
CO2: 24 meq/L (ref 19–32)
Calcium: 8 mg/dL — ABNORMAL LOW (ref 8.4–10.5)
Creatinine, Ser: 0.45 mg/dL — ABNORMAL LOW (ref 0.50–1.35)
GFR calc Af Amer: >90 mL/min (ref >90)
Glucose, Bld: 155 mg/dL — ABNORMAL HIGH (ref 70–99)
POTASSIUM: 3 meq/L — AB (ref 3.7–5.3)
SODIUM: 137 meq/L (ref 137–147)
Total Bilirubin: 0.8 mg/dL (ref 0.3–1.2)
Total Protein: 4.7 g/dL — ABNORMAL LOW (ref 6.0–8.3)

## 2013-05-03 LAB — CBC WITH DIFFERENTIAL/PLATELET
BASOS PCT: 0 % (ref 0–1)
Basophils Absolute: 0 10*3/uL (ref 0.0–0.1)
EOS PCT: 28 % — AB (ref 0–5)
Eosinophils Absolute: 0.1 10*3/uL (ref 0.0–0.7)
HCT: 26.6 % — ABNORMAL LOW (ref 39.0–52.0)
HEMOGLOBIN: 9.5 g/dL — AB (ref 13.0–17.0)
LYMPHS PCT: 16 % (ref 12–46)
Lymphs Abs: 0.1 10*3/uL — ABNORMAL LOW (ref 0.7–4.0)
MCH: 30.4 pg (ref 26.0–34.0)
MCHC: 35.7 g/dL (ref 30.0–36.0)
MCV: 85 fL (ref 78.0–100.0)
Monocytes Absolute: 0.1 10*3/uL (ref 0.1–1.0)
Monocytes Relative: 19 % — ABNORMAL HIGH (ref 3–12)
NEUTROS PCT: 37 % — AB (ref 43–77)
Neutro Abs: 0.1 10*3/uL — ABNORMAL LOW (ref 1.7–7.7)
PLATELETS: 22 10*3/uL — AB (ref 150–400)
RBC: 3.13 MIL/uL — AB (ref 4.22–5.81)
RDW: 15.2 % (ref 11.5–15.5)
WBC: 0.4 10*3/uL — AB (ref 4.0–10.5)

## 2013-05-03 LAB — MAGNESIUM: Magnesium: 1.5 mg/dL (ref 1.5–2.5)

## 2013-05-03 LAB — TRIGLYCERIDES: Triglycerides: 149 mg/dL (ref <150)

## 2013-05-03 LAB — PHOSPHORUS: Phosphorus: 2.5 mg/dL (ref 2.3–4.6)

## 2013-05-03 LAB — PREALBUMIN: Prealbumin: 15.7 mg/dL — ABNORMAL LOW (ref 17.0–34.0)

## 2013-05-03 MED ORDER — POTASSIUM CHLORIDE 10 MEQ/50ML IV SOLN
10.0000 meq | INTRAVENOUS | Status: AC
  Administered 2013-05-03 (×6): 10 meq via INTRAVENOUS
  Filled 2013-05-03 (×6): qty 50

## 2013-05-03 MED ORDER — TRACE MINERALS CR-CU-F-FE-I-MN-MO-SE-ZN IV SOLN
INTRAVENOUS | Status: AC
  Administered 2013-05-03: 18:00:00 via INTRAVENOUS
  Filled 2013-05-03: qty 2000

## 2013-05-03 MED ORDER — FAT EMULSION 20 % IV EMUL
250.0000 mL | INTRAVENOUS | Status: AC
  Administered 2013-05-03: 250 mL via INTRAVENOUS
  Filled 2013-05-03: qty 250

## 2013-05-03 NOTE — Evaluation (Signed)
Physical Therapy Evaluation Patient Details Name: Keith Duffy MRN: [id number] DOB: Aug 15, 1945 Today's Date: 05/03/2013 Time: 1751-0258 PT Time Calculation (min): 27 min  PT Assessment / Plan / Recommendation History of Present Illness  With a hx of DM and NHL s/p recent admission for chemo 4 days ago who presents to the ED with complaints of worsening generalized weakness and being unable to ambulate on his own. 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  Wife is supportive and in attendance.  Pt may benefit from a prevelon boot to protect right foot from inadvertent bumping and skin breakdown. Wife will ask MD if they want to try it.     PT Assessment  Patient needs continued PT services    Follow Up Recommendations  Home health PT (if pt/family agree)    Does the patient have the potential to tolerate intense rehabilitation      Barriers to Discharge        Equipment Recommendations  3in1 (PT)    Recommendations for Other Services     Frequency Min 3X/week    Precautions / Restrictions     Pertinent Vitals/Pain Pt c/o pain in sacrum with mobility, pain in left foot      Mobility  Bed Mobility Overal bed mobility: Needs Assistance;+2 for physical assistance Bed Mobility: Rolling;Supine to Sit;Sit to Supine Rolling: Mod assist Supine to sit: Mod assist (head of bed raised) Sit to supine: +2 for physical assistance;Mod assist General bed mobility comments: needs assist to lift legs onto bed    Exercises General Exercises - Upper Extremity Shoulder Flexion: AAROM;Both;Supine (3 reps in partial range of motion) General Exercises - Lower Extremity Short Arc Quad: AAROM;Both;5 reps;Supine Hip Flexion/Marching: AAROM;Both;5 reps;Supine Other Exercises Other Exercises: maintained legs in foot flate, hips and knees bent position and patient  was able to stabalize for 5 sec Other Exercises: encouraged deep breathing   PT Diagnosis: Generalized weakness;Difficulty walking;Abnormality of gait  PT Problem List: Decreased strength;Decreased activity tolerance;Decreased mobility;Pain PT Treatment Interventions: Functional mobility training;Therapeutic activities;Therapeutic exercise;Balance training;Patient/family education     PT Goals(Current goals can be found in the care plan section) Acute Rehab PT Goals Patient Stated Goal: to go home PT Goal Formulation: With patient/family Time For Goal Achievement: 05/10/13 Potential to Achieve Goals: Fair  Visit Information  Last PT Received On: 05/03/13 Assistance Needed: +1 History of Present Illness: With a hx of DM and NHL s/p recent admission for chemo 4 days ago who presents to the ED with complaints of worsening generalized weakness and being unable to ambulate on his own. 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 also       Prior [place] expects to be discharged to:: Private residence Living Arrangements: Spouse/significant other Available Help at Discharge: Family;Available 24 hours/day Type of Home: House Home Access: Ramped entrance Home Layout: One level Home Equipment: Walker - 2 wheels;Wheelchair - manual Additional Comments: patient and wife interested in bedside commode Prior Function Level of Independence: Needs assistance Communication Communication: No difficulties    Cognition  Cognition Arousal/Alertness: Awake/alert Behavior During Therapy: WFL for tasks assessed/performed;Flat affect Overall Cognitive Status: Within Functional Limits for tasks assessed    Extremity/Trunk Assessment Lower Extremity Assessment Lower Extremity Assessment: Generalized weakness;LLE deficits/detail;RLE deficits/detail RLE Deficits / Details:  muscle atophy  LLE Deficits / Details: absent  dorsiflexion and extreme sensitivity to touch on toes per wife.  Pt was able to activate muscles for assisted hip flexion and knee extension, but atrophy evident LLE Sensation: decreased light touch;decreased proprioception;history of peripheral neuropathy LLE Coordination: decreased fine motor Cervical / Trunk Assessment Cervical / Trunk Assessment: Other exceptions Cervical / Trunk Exceptions: generalized muscle atophy. Pt with small alleyn patch on sacrum.  Area is painful to patient when he is rolling on it   Balance Balance Overall balance assessment: Needs assistance Sitting-balance support: Bilateral upper extremity supported;Feet supported Sitting balance-Leahy Scale: Fair Sitting balance - Comments: pt became lightheaded after 15 seconds of sitting on edge of bed  End of Session PT - End of Session Activity Tolerance: Patient limited by fatigue Patient left: in bed;with family/visitor present  GP    Helene Kelp K. [place], [place] 05/03/2013, 11:08 AM

## 2013-05-03 NOTE — Progress Notes (Signed)
PARENTERAL NUTRITION CONSULT NOTE - Follow-up  Pharmacy Consult for TNA Indication: malnutrition in setting of Non-Hodgkin's Lymphoma with abd pain, nausea, and oral mucositis  Allergies  Allergen Reactions  . Aspartame And Phenylalanine Diarrhea  . Codeine Other (See Comments)    "Made me go wild when I was young"    Patient Measurements: Height: 5\' 9"  (175.3 cm) Weight: 168 lb 14.4 oz (76.613 kg) IBW/kg (Calculated) : 70.7 . Vital Signs: Temp: 98.1 F (36.7 C) (03/16 0450) Temp src: Oral (03/16 0450) BP: 157/90 mmHg (03/16 0450) Pulse Rate: 104 (03/16 0450) Intake/Output from previous day: 03/15 0701 - 03/16 0700 In: 2359 [I.V.:1266.3; IV Piggyback:723.7; TPN:369] Out: 3000 [Urine:3000] Intake/Output from this shift:    Labs:  Recent Labs  05/01/13 0500 05/02/13 0530 05/03/13 0445  WBC 0.2* 0.2* 0.4*  HGB 9.5* 9.7* 9.5*  HCT 26.4* 27.0* 26.6*  PLT 9* 35* 22*     Recent Labs  05/01/13 0500 05/02/13 0530 05/03/13 0445  NA 136* 137 137  K 2.5* 3.7 3.0*  CL 102 103 101  CO2 22 22 24   GLUCOSE 134* 228* 155*  BUN 12 18 18   CREATININE 0.46* 0.46* 0.45*  CALCIUM 7.9* 8.2* 8.0*  MG 1.8 1.7 1.5  PHOS 2.4 2.4 2.5  PROT 4.9* 4.9* 4.7*  ALBUMIN 1.9* 2.0* 2.0*  AST 12 24 21   ALT 14 31 42  ALKPHOS 67 140* 137*  BILITOT 0.8 0.7 0.8  PREALBUMIN 10.7*  --   --   TRIG 169*  --  149   Estimated Creatinine Clearance: 92.1 ml/min (by C-G formula based on Cr of 0.45).    Recent Labs  05/03/13 0002 05/03/13 0408 05/03/13 0748  GLUCAP 188* 159* 175*    Medical History: Past Medical History  Diagnosis Date  . Back pain   . Hypertension   . Diabetes mellitus without complication   . Cancer     non hodkins lymphoma    Medications:  Scheduled:  . acyclovir  5 mg/kg Intravenous 3 times per day  . artificial tears   Right Eye 3 times per day  . chlorhexidine  10 mL Mouth/Throat QID  . docusate sodium  100 mg Oral BID  . feeding supplement (ENSURE  COMPLETE)  237 mL Oral BID BM  . fentaNYL  12.5 mcg Transdermal Q72H  . filgrastim  780 mcg Subcutaneous Daily  . imipenem-cilastatin  500 mg Intravenous Q6H  . insulin aspart  0-15 Units Subcutaneous 6 times per day  . insulin glargine  25 Units Subcutaneous Daily  . methylphenidate  5 mg Oral TID WC  . metoprolol tartrate  25 mg Oral BID  . pantoprazole (PROTONIX) IV  40 mg Intravenous Q12H  . potassium chloride  10 mEq Intravenous Q1 Hr x 6  . saccharomyces boulardii  250 mg Oral BID  . sodium bicarbonate/sodium chloride  1 application Mouth Rinse 6 X Daily  . sodium chloride  10-40 mL Intracatheter Q12H   Infusions:  . 0.9 % NaCl with KCl 40 mEq / L 50 mL/hr at 05/03/13 0500  . Marland KitchenTPN (CLINIMIX-E) Adult 60 mL/hr at 05/02/13 1733   And  . fat emulsion 250 mL (05/02/13 1733)   PRN: acetaminophen, acetaminophen, lidocaine, LORazepam, morphine injection, ondansetron (ZOFRAN) IV, ondansetron, oxyCODONE, polyethylene glycol, prochlorperazine, sodium chloride, sorbitol, tamsulosin  Insulin Requirements in the past 24 hours:  25 units moderate SSI, 15 units/L insulin in TNA and 25 units Lantus.  Solu-meddrol 125mg  x 1 on 3/14.  Current Nutrition:  Regular diet - eating very little per wife Ensure complete between meals: not charted  Maintenance IVF: NS with KCl 66mEq/L at 50 mL/hr  Nutritional Goals: (per RD assessment 3/9):  2100 - 2300 KCal/day, 85 - 100 grams protein/day, Fluid >/= 2100 ml/day -Calculated goal rate of 80 ml/hr using Clinimix E 5/15 to provide 1920 ml/day, 96 g protein/day and 1843 kcal/day  TNA Access: Central via implanted port.  Renal: K 3.0 - 6 runs of KCL today. Other lytes stable WNL LFTs: WNL, alk phos increased to 140 Endo: Hx insulin dependent DM.  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  Malnourished per RD assessment 3/9 and oral intake is reportedly still poor secondary to abdominal discomfort and possibly oral mucositis.  Noted hx of insulin-dependent DM. Achievement of full recommended caloric intake using TNA alone while still maintaining reasonable glucose control will be challenging. Anticpate CBGs will rise further on TNA and additional insulin will be needed.   6/15: No complications with TNA per RN.  Lytes stable except K of 3 - replacement ordered per Md already.  CBGs appear to be stabilizing with restart of Lantus 25 units daily and adjustment of insulin in TNA. Per patient and wife, patient still with no appetite except ate small frosty yesterday  Plan: At 1800 tonight: 1) Will continue Clinimix E 5/15 at current rate of 60 ml/hr for tonight in order to wait for CBGs to stabilize with restarting of Lantus yesterday and insulin being in TNA.  2) Fat Emulsion 20% at 10 ml/hr 3) Multivitamins in TNA daily 4) Trace Elements in TNA MWF only due to national shortages 5) Continue insulin 15 units/L in TNA, Lantus 25 units daily, and current SSI 6) Once CBGs are controlled will consider changing Clinimix from E 5/15 formula to E 5/20 in order to better meet caloric goals per RD assessment. 7) K 3 - 6 runs of KCL ordered per Md already  Adrian Saran, PharmD, BCPS Pager [phone] 05/03/2013 9:30 AM

## 2013-05-03 NOTE — Care Management Note (Signed)
Cm spoke with patient and family present at the bedside concerning discharge planning. Pt recommendation for HHPT. Pt offered choice list for [facility]. No choice made at this time. Will continue to follow.   Venita Lick Lavere Shinsky,MSN,RN [phone]

## 2013-05-03 NOTE — Progress Notes (Signed)
He is finally getting a little better.WBC is 0.4!!  No bleeding.  On TNA.  Is eating a little better.  Mouth is a little less sore.  I think that we need to get physical therapy involved. He is incredibly weak.  There's been no fever. Cultures are still negative. He is on IV antibiotics. I'll keep the antibiotics going for right now.  There's been no diarrhea. He actually is back on Colace.  His had no cough. He's had no fever. He's had no leg swelling. There's been no rashes.  On his vital signs, temperature 98.1. Pulse 104. Blood pressure 157/90. His oral exam shows some slightly improved oral mucosa. There's still some old dry blood in his mouth. Lungs are clear. Cardiac exam regular rate and rhythm with no murmurs or bruits. Abdomen is soft. Has good bowel sounds. 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  His potassium is 3.0. Albumin him some extra potassium.  His white cell count is holding steady. His platelet count is going down but he is now bleeding so I do not see need for a transfusion.  Again in, his hospital course will be dictated by his white cell count.  I will continue his antibiotics. Even though he is eating a little bit better, we will continue the TNA.  I think that if he continues to improve, we might be able to get him out on Wednesday or Thursday of this week.   Mikey Kirschner 3:19

## 2013-05-03 NOTE — Progress Notes (Signed)
NUTRITION FOLLOW UP  Intervention:    TPN per PharmD  Continue to recommend Chocolate Ensure Complete BID, each supplement provides 350 kcal and 13 grams protein  Recommend chocolate milkshake once daily (to start 3/17)  Will continue to monitor  Nutrition Dx:   Inadequate oral intake related to abd pain/nausea as evidenced by PO intake <75% for 7 days, ongoing  Goal:   Pt to meet >/= 90% of their estimated nutrition needs, currently unmet  Monitor:   Total protein/energy intake, labs, weights, GI profile, TPN initiation, diet advancement  Assessment:   With a hx of DM and NHL s/p recent admission for chemo 4 days ago who presents to the ED with complaints of worsening generalized weakness and being unable to ambulate on his own. The patient also reported nausea with decreased appetite. In the Ed, the patient was noted to have dry mucus membranes. Renal function was normal. The patient was noted again to be unable to stand in the ED.  From previous nutrition note on 3/9: -Pt reported minimal intake for past 7 days d/t abd pain. Unable to tolerate any solids or liquids. Confirmed minimal appetite prior to last week period. PO intake ranged from 25-100% during previous admit in 2/28  -Received chemo treatments 4 days ago  -Was drinking chocolate Ensure BID but has been unable to to tolerate  -On clear liquid diet. Pt with 0% PO intake. Tried to eat broth and jello but this caused more nausea and abd pain -Pt reported usual body weight around 155 lbs. Felt that he has lost weight in past week from minimal PO intake. Current weight may not be accurate d/t edemas -Pt cannot tolerate aspartame. Causes loose stools per wife  3/13: Per Oncology, patient started on TPN today, 3/13.  Patient is an appropriate candidate for TPN as patient with prolonged nausea, vomiting, and diarrhea and altered GI function.    Per conversation with pharmacist, Clinimix-E 5/15 will start tonight at 40 ml/hr  with 20% lipids at 10 ml/hr to provide 1162 kcal (55% of minimum estimated needs) and 48 grams protein (56% of minimum estimated needs). As tolerated, rate will increase to goal of 80 ml/hr to provide 1843 kcal (88% of minimum estimated needs) and 96 grams protein (100% of minimum estimated needs). If glucose control is reasonable and PO intake is still minimal will then transition to Clinimix-E 5/20 at 80 ml/hr with 20% lipids at 10 ml/hr (provides 2170 kcal-103% of minimum estimated needs- and 96 grams protein-113% of minimum estimated needs) to better meet calorie needs. Insulin will be be added to TPN and adjusted as needed. Refeeding labs to be monitored.   Patient was sleeping upon dietetic intern visit. RD spoke with patient's wife at bedside. Patients wife stated that patient can not tolerate any food or liquids other than sips of water due to mouth sores that extend down his esophagus. Wife reported encouraging patient to try different PO supplements and foods (jello, broths). Wife stated that patient is unable to tolerate and will only take 1 bite. RD offered the option to get patient other foods available in the cafeteria and provided aspartame free Boost Glucose Control beverage for patient to try once awake.   Recommend initiating appetite stimulant so that once mouth sores begin healing, patient will want to start eating a PO diet. Pharmacy contacted MD, who reported that appetite stimulant may be consider. Will also modify Ritalin as this may also suppress appetite  Potassium low at 2.9 mEq/L,  receiving potassium chloride No new magnesium and phosphorus labs Calcium low at 7.8 mg/dL CBGs elevated 179 mg/dL, 162 mg/dL, and 166 mg/dL  3/16: -Per pharmacy, plan to continue Clinimix 5/15 at 60 ml/hr to wait for CBG's to stabilize. Lipids at 20% at 10 ml/hr with MVI daily. TE MWF d/t national shortage -CBG's improving with Lantus 25 units daily and insulin adjustment. 159-229 mg/dl in past 24  hours -Goal to transition to Clinimix 5/20 at 80 ml/hr to provide 2170 kcal, and 96 gram protein (100% est kcal and protein needs) -Phosphorus and Magnesium WNL -Potassium 3.0, receiving potassium chloride and KCL -MD noted some improvement in patient's oral mucosa. Receiving sodium bicarbonate mouthwash -Having formed stools, on Colace -Minimal PO intake, not consuming Ensure Complete. Pt has just started being able to tolerate water today per wife.  -Family brought in [place] yesterday, which pt consumed 100%. Goal for pt today is to consume Boost Glucose control supplement today, along with additional Genene Churn that family provided. -Offered milkshakes. Pt in agreement to try one tomorrow as he already has [place] planned for today.  -Pt not able to tolerate any solid foods d/t mouth pains  Pt continues to meet criteria for severe MALNUTRITION in the context of chronic illness as evidenced by PO intake <75% est nutrition needs for > one month, severe muscle wasting and moderate subcutaneous fat loss.  Height: Ht Readings from Last 1 Encounters:  04/25/13 5\' 9"  (1.753 m)    Weight Status:   Wt Readings from Last 1 Encounters:  04/25/13 168 lb 14.4 oz (76.613 kg)  04/14/13            155 lb (70.3 kg) -Wt from 3/8 is falsely elevated due to edema  Re-estimated needs:  Kcal: 2100-2300  Protein: 85-100 gram  Fluid: >/=2100 ml/daily  Skin: +1 RLE edema, +1 LLE edema   Diet Order: General   Intake/Output Summary (Last 24 hours) at 05/03/13 1037 Last data filed at 05/03/13 0528  Gross per 24 hour  Intake 2259.03 ml  Output   3000 ml  Net -740.97 ml    Last BM: 3/14   Labs:   Recent Labs Lab 05/01/13 0500 05/02/13 0530 05/03/13 0445  NA 136* 137 137  K 2.5* 3.7 3.0*  CL 102 103 101  CO2 22 22 24   BUN 12 18 18   CREATININE 0.46* 0.46* 0.45*  CALCIUM 7.9* 8.2* 8.0*  MG 1.8 1.7 1.5  PHOS 2.4 2.4 2.5  GLUCOSE 134* 228* 155*  Prealbumin 10.7 (3/14)  CBG (last 3)    Recent Labs  05/03/13 0002 05/03/13 0408 05/03/13 0748  GLUCAP 188* 159* 175*    Scheduled Meds: . acyclovir  5 mg/kg Intravenous 3 times per day  . artificial tears   Right Eye 3 times per day  . chlorhexidine  10 mL Mouth/Throat QID  . docusate sodium  100 mg Oral BID  . feeding supplement (ENSURE COMPLETE)  237 mL Oral BID BM  . fentaNYL  12.5 mcg Transdermal Q72H  . filgrastim  780 mcg Subcutaneous Daily  . imipenem-cilastatin  500 mg Intravenous Q6H  . insulin aspart  0-15 Units Subcutaneous 6 times per day  . insulin glargine  25 Units Subcutaneous Daily  . methylphenidate  5 mg Oral TID WC  . metoprolol tartrate  25 mg Oral BID  . pantoprazole (PROTONIX) IV  40 mg Intravenous Q12H  . potassium chloride  10 mEq Intravenous Q1 Hr x 6  . saccharomyces boulardii  250 mg Oral BID  . sodium bicarbonate/sodium chloride  1 application Mouth Rinse 6 X Daily  . sodium chloride  10-40 mL Intracatheter Q12H    Continuous Infusions: . 0.9 % NaCl with KCl 40 mEq / L 50 mL/hr at 05/03/13 0500  . Marland KitchenTPN (CLINIMIX-E) Adult 60 mL/hr at 05/02/13 1733   And  . fat emulsion 250 mL (05/02/13 1733)  . Marland KitchenTPN (CLINIMIX-E) Adult     And  . fat emulsion         Atlee Abide MS RD LDN Clinical Dietitian [phone]

## 2013-05-03 NOTE — Progress Notes (Signed)
CRITICAL VALUE ALERT  Critical value received: Platelets 22  Date of notification:  3/16  Time of notification:  0543  Critical value read back: yes  Nurse who received alert:  Otho Bellows, RN  MD notified (1st page):  Oncology  Time of first page:  Message center [phone]  MD notified (2nd page):  Time of second page:  Responding MD:  Dr. Annice Needy  Time MD responded:  [phone]  No new orders at this time.

## 2013-05-04 ENCOUNTER — Ambulatory Visit: Payer: Medicare Other

## 2013-05-04 ENCOUNTER — Other Ambulatory Visit: Payer: Medicare Other | Admitting: Lab

## 2013-05-04 ENCOUNTER — Ambulatory Visit: Payer: Medicare Other | Admitting: Hematology & Oncology

## 2013-05-04 LAB — COMPREHENSIVE METABOLIC PANEL
ALT: 31 U/L (ref 0–53)
AST: 14 U/L (ref 0–37)
Albumin: 2 g/dL — ABNORMAL LOW (ref 3.5–5.2)
Alkaline Phosphatase: 128 U/L — ABNORMAL HIGH (ref 39–117)
BUN: 17 mg/dL (ref 6–23)
CHLORIDE: 102 meq/L (ref 96–112)
CO2: 24 mEq/L (ref 19–32)
Calcium: 8 mg/dL — ABNORMAL LOW (ref 8.4–10.5)
Creatinine, Ser: 0.45 mg/dL — ABNORMAL LOW (ref 0.50–1.35)
GFR calc Af Amer: >90 mL/min (ref >90)
GLUCOSE: 191 mg/dL — AB (ref 70–99)
Potassium: 3.5 mEq/L — ABNORMAL LOW (ref 3.7–5.3)
Sodium: 137 mEq/L (ref 137–147)
Total Bilirubin: 0.6 mg/dL (ref 0.3–1.2)
Total Protein: 4.7 g/dL — ABNORMAL LOW (ref 6.0–8.3)

## 2013-05-04 LAB — CBC WITH DIFFERENTIAL/PLATELET
BASOS PCT: 0 % (ref 0–1)
Basophils Absolute: 0 10*3/uL (ref 0.0–0.1)
EOS PCT: 18 % — AB (ref 0–5)
Eosinophils Absolute: 0.2 10*3/uL (ref 0.0–0.7)
HEMATOCRIT: 26.8 % — AB (ref 39.0–52.0)
Hemoglobin: 9.7 g/dL — ABNORMAL LOW (ref 13.0–17.0)
LYMPHS ABS: 0.1 10*3/uL — AB (ref 0.7–4.0)
Lymphocytes Relative: 9 % — ABNORMAL LOW (ref 12–46)
MCH: 30.7 pg (ref 26.0–34.0)
MCHC: 36.2 g/dL — ABNORMAL HIGH (ref 30.0–36.0)
MCV: 84.8 fL (ref 78.0–100.0)
MONOS PCT: 2 % — AB (ref 3–12)
Monocytes Absolute: 0 10*3/uL — ABNORMAL LOW (ref 0.1–1.0)
NEUTROS ABS: 1 10*3/uL — AB (ref 1.7–7.7)
Neutrophils Relative %: 71 % (ref 43–77)
PLATELETS: 18 10*3/uL — AB (ref 150–400)
RBC: 3.16 MIL/uL — AB (ref 4.22–5.81)
RDW: 14.9 % (ref 11.5–15.5)
WBC: 1.3 10*3/uL — CL (ref 4.0–10.5)

## 2013-05-04 LAB — GLUCOSE, CAPILLARY
GLUCOSE-CAPILLARY: 201 mg/dL — AB (ref 70–99)
Glucose-Capillary: 185 mg/dL — ABNORMAL HIGH (ref 70–99)
Glucose-Capillary: 194 mg/dL — ABNORMAL HIGH (ref 70–99)
Glucose-Capillary: 198 mg/dL — ABNORMAL HIGH (ref 70–99)
Glucose-Capillary: 207 mg/dL — ABNORMAL HIGH (ref 70–99)

## 2013-05-04 LAB — CULTURE, BLOOD (ROUTINE X 2)
CULTURE: NO GROWTH
Culture: NO GROWTH

## 2013-05-04 LAB — PHOSPHORUS: Phosphorus: 3.1 mg/dL (ref 2.3–4.6)

## 2013-05-04 LAB — MAGNESIUM: MAGNESIUM: 1.5 mg/dL (ref 1.5–2.5)

## 2013-05-04 MED ORDER — FAT EMULSION 20 % IV EMUL
250.0000 mL | INTRAVENOUS | Status: AC
Start: 2013-05-04 — End: 2013-05-05
  Administered 2013-05-04: 250 mL via INTRAVENOUS
  Filled 2013-05-04: qty 250

## 2013-05-04 MED ORDER — MEGESTROL ACETATE 40 MG/ML PO SUSP
400.0000 mg | Freq: Every day | ORAL | Status: DC
  Administered 2013-05-04 – 2013-05-09 (×7): 400 mg via ORAL
  Filled 2013-05-04 (×9): qty 10

## 2013-05-04 MED ORDER — POTASSIUM CHLORIDE 10 MEQ/50ML IV SOLN
10.0000 meq | INTRAVENOUS | Status: AC
  Administered 2013-05-04 (×3): 10 meq via INTRAVENOUS
  Filled 2013-05-04 (×3): qty 50

## 2013-05-04 MED ORDER — MAGNESIUM SULFATE IN D5W 10-5 MG/ML-% IV SOLN
1.0000 g | Freq: Once | INTRAVENOUS | Status: AC
  Administered 2013-05-04: 1 g via INTRAVENOUS
  Filled 2013-05-04: qty 100

## 2013-05-04 MED ORDER — M.V.I. ADULT IV INJ
INTRAVENOUS | Status: AC
  Administered 2013-05-04: 18:00:00 via INTRAVENOUS
  Filled 2013-05-04: qty 2000

## 2013-05-04 NOTE — Progress Notes (Signed)
[name] is still pretty weak. His white cell count is now 1.3. He is afebrile. Since all his cultures have been negative, I will stop his Primaxin and the acyclovir.  He continues on TNA. He's still not eating all that much. We will try him on some Megace.  He did have some physical therapy yesterday. He really cannot do all that much.  He still has the Foley catheter in. Currently reacted Cubicin for right now.  He did have some loose stools yesterday.  He's had no bleeding. He's had no shortness of breath. He's had no abdominal pain.  On his physical exam, his temperature is 98.2. Pulse is 110. Blood pressure 132/80. Head and neck exam shows no ocular or oral lesions. He does have some mucositis. There is some dried blood on his lips. His oral mucosa is somewhat dry. Lungs are clear. Cardiac exam tachycardic but regular. Abdomen is soft. Has good bowel sounds. Extremities shows some muscle atrophy in both upper and lower extremities. Skin exam shows some scattered ecchymoses. Neurological exam is nonfocal also the right facial nerve deficit.  Thankfully his white cell count is recovering now. I would keep him on the Neupogen for right now. We probably can stop this after tomorrow. I will stop the antibiotics.  It is now about trying to get him to eat better. It is about trying to get him stronger.  I really want to get him home. I still think that he is ready for this right now.  I will see about taking some fluid out of the [place] reservoir tomorrow.  We will see about getting him to go outside in a wheelchair so that he might be open for some fresh air which could help his overall mental state.   Keith Duffy 3:19

## 2013-05-04 NOTE — Progress Notes (Signed)
PARENTERAL NUTRITION CONSULT NOTE - Follow-up  Pharmacy Consult for TNA Indication: malnutrition in setting of Non-Hodgkin's Lymphoma with abd pain, nausea, and oral mucositis  Allergies  Allergen Reactions  . Aspartame And Phenylalanine Diarrhea  . Codeine Other (See Comments)    "Made me go wild when I was young"    Patient Measurements: Height: 5\' 9"  (175.3 cm) Weight: 168 lb 14.4 oz (76.613 kg) IBW/kg (Calculated) : 70.7 . Vital Signs: Temp: 98.2 F (36.8 C) (03/17 0606) Temp src: Oral (03/17 0606) BP: 132/80 mmHg (03/17 0606) Pulse Rate: 110 (03/17 0606) Intake/Output from previous day: 03/16 0701 - 03/17 0700 In: 935 [I.V.:442; TPN:493] Out: 3325 [Urine:3325] Intake/Output from this shift:    Labs:  Recent Labs  05/02/13 0530 05/03/13 0445 05/04/13 0603  WBC 0.2* 0.4* 1.3*  HGB 9.7* 9.5* 9.7*  HCT 27.0* 26.6* 26.8*  PLT 35* 22* 18*     Recent Labs  05/02/13 0530 05/03/13 0445 05/04/13 0603  NA 137 137 137  K 3.7 3.0* 3.5*  CL 103 101 102  CO2 22 24 24   GLUCOSE 228* 155* 191*  BUN 18 18 17   CREATININE 0.46* 0.45* 0.45*  CALCIUM 8.2* 8.0* 8.0*  MG 1.7 1.5 1.5  PHOS 2.4 2.5 3.1  PROT 4.9* 4.7* 4.7*  ALBUMIN 2.0* 2.0* 2.0*  AST 24 21 14   ALT 31 42 31  ALKPHOS 140* 137* 128*  BILITOT 0.7 0.8 0.6  PREALBUMIN  --  15.7*  --   TRIG  --  149  --    Estimated Creatinine Clearance: 92.1 ml/min (by C-G formula based on Cr of 0.45).    Recent Labs  05/03/13 2009 05/03/13 2346 05/04/13 0426  GLUCAP 175* 173* 185*    Medical History: Past Medical History  Diagnosis Date  . Back pain   . Hypertension   . Diabetes mellitus without complication   . Cancer     non hodkins lymphoma    Medications:  Scheduled:  . acyclovir  5 mg/kg Intravenous 3 times per day  . artificial tears   Right Eye 3 times per day  . chlorhexidine  10 mL Mouth/Throat QID  . docusate sodium  100 mg Oral BID  . feeding supplement (ENSURE COMPLETE)  237 mL Oral BID  BM  . fentaNYL  12.5 mcg Transdermal Q72H  . filgrastim  780 mcg Subcutaneous Daily  . imipenem-cilastatin  500 mg Intravenous Q6H  . insulin aspart  0-15 Units Subcutaneous 6 times per day  . insulin glargine  25 Units Subcutaneous Daily  . methylphenidate  5 mg Oral TID WC  . metoprolol tartrate  25 mg Oral BID  . pantoprazole (PROTONIX) IV  40 mg Intravenous Q12H  . saccharomyces boulardii  250 mg Oral BID  . sodium bicarbonate/sodium chloride  1 application Mouth Rinse 6 X Daily  . sodium chloride  10-40 mL Intracatheter Q12H   Infusions:  . 0.9 % NaCl with KCl 40 mEq / L 50 mL/hr at 05/04/13 0556  . Marland KitchenTPN (CLINIMIX-E) Adult 60 mL/hr at 05/03/13 1735   And  . fat emulsion 250 mL (05/03/13 1735)   PRN: acetaminophen, acetaminophen, lidocaine, LORazepam, morphine injection, ondansetron (ZOFRAN) IV, ondansetron, oxyCODONE, polyethylene glycol, prochlorperazine, sodium chloride, sorbitol, tamsulosin  Insulin Requirements in the past 24 hours:  20 units moderate SSI, 15 units/L insulin in TNA and 25 units Lantus.    Current Nutrition:  Regular diet - eating very little. Yesterday he had half a frosty milkshake. Today wife  reports he has had 6 half tsp of grits, 6 half tsp strawberry yogurt, and half carton whole milk--going to try to work on some Ensure later today.  Maintenance IVF: NS with KCl 19mEq/L at 50 mL/hr  Nutritional Goals: (per RD assessment 3/9):  2100 - 2300 KCal/day, 85 - 100 grams protein/day, Fluid >/= 2100 ml/day -Calculated goal rate of 80 ml/hr using Clinimix E 5/15 to provide 1920 ml/day, 96 g protein/day and 1843 kcal/day  TNA Access: Central via implanted port.  Renal: K 3.5-MD ordered 11mEq KCl x3 to replete. Magnesium 1.5--will replete with 1gm magnesium. Other lytes stable WNL. LFTs: WNL, alk phos increased to 128 Endo: Hx insulin dependent DM.  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  Malnourished per RD assessment 3/9 and oral intake is reportedly still poor secondary to abdominal discomfort and possibly oral mucositis.  Noted hx of insulin-dependent DM.   4/71: No complications with TNA per RN.  K 3.5 today- replacement ordered per MD already. CBGs remaining fairly stable with restart of Lantus 25 units daily and adjustment of insulin in TNA. MD has started Megace 400 mg daily.  Plan: At 1800 tonight: 1) Will continue Clinimix E 5/15 at current rate of 60 ml/hr  2) Fat Emulsion 20% at 10 ml/hr 3) Multivitamins in TNA daily 4) Trace Elements in TNA MWF only due to national shortages 5) Continue insulin 15 units/L in TNA, Lantus 25 units daily, and current SSI 6) Continue to encourage dietary intake by mouth 7) K 3.5--MD replaced with 74mEq KCl x 3 8) Magnesium 1.5--replace with 1gm mag sulfate and follow up cmet/magnesium tomorrow  Dicky Doe, PharmD, BCPS Clinical Pharmacist Pager: [phone] 05/04/2013 7:09 AM

## 2013-05-05 DIAGNOSIS — R630 Anorexia: Secondary | ICD-10-CM

## 2013-05-05 LAB — CBC WITH DIFFERENTIAL/PLATELET
BASOS ABS: 0 10*3/uL (ref 0.0–0.1)
Basophils Relative: 0 % (ref 0–1)
EOS PCT: 10 % — AB (ref 0–5)
Eosinophils Absolute: 0.3 10*3/uL (ref 0.0–0.7)
HCT: 26.9 % — ABNORMAL LOW (ref 39.0–52.0)
Hemoglobin: 9.2 g/dL — ABNORMAL LOW (ref 13.0–17.0)
Lymphocytes Relative: 8 % — ABNORMAL LOW (ref 12–46)
Lymphs Abs: 0.2 10*3/uL — ABNORMAL LOW (ref 0.7–4.0)
MCH: 29.7 pg (ref 26.0–34.0)
MCHC: 34.2 g/dL (ref 30.0–36.0)
MCV: 86.8 fL (ref 78.0–100.0)
MONOS PCT: 5 % (ref 3–12)
Monocytes Absolute: 0.1 10*3/uL (ref 0.1–1.0)
NEUTROS PCT: 77 % (ref 43–77)
Neutro Abs: 2.1 10*3/uL (ref 1.7–7.7)
Platelets: 24 10*3/uL — CL (ref 150–400)
RBC: 3.1 MIL/uL — AB (ref 4.22–5.81)
RDW: 15.2 % (ref 11.5–15.5)
WBC MORPHOLOGY: INCREASED
WBC: 2.7 10*3/uL — ABNORMAL LOW (ref 4.0–10.5)

## 2013-05-05 LAB — GLUCOSE, CAPILLARY
GLUCOSE-CAPILLARY: 158 mg/dL — AB (ref 70–99)
GLUCOSE-CAPILLARY: 158 mg/dL — AB (ref 70–99)
GLUCOSE-CAPILLARY: 163 mg/dL — AB (ref 70–99)
Glucose-Capillary: 196 mg/dL — ABNORMAL HIGH (ref 70–99)
Glucose-Capillary: 204 mg/dL — ABNORMAL HIGH (ref 70–99)
Glucose-Capillary: 229 mg/dL — ABNORMAL HIGH (ref 70–99)

## 2013-05-05 LAB — COMPREHENSIVE METABOLIC PANEL
ALT: 32 U/L (ref 0–53)
AST: 15 U/L (ref 0–37)
Albumin: 2.1 g/dL — ABNORMAL LOW (ref 3.5–5.2)
Alkaline Phosphatase: 128 U/L — ABNORMAL HIGH (ref 39–117)
BILIRUBIN TOTAL: 0.5 mg/dL (ref 0.3–1.2)
BUN: 17 mg/dL (ref 6–23)
CHLORIDE: 105 meq/L (ref 96–112)
CO2: 22 mEq/L (ref 19–32)
CREATININE: 0.45 mg/dL — AB (ref 0.50–1.35)
Calcium: 8.2 mg/dL — ABNORMAL LOW (ref 8.4–10.5)
GFR calc Af Amer: >90 mL/min (ref >90)
GFR calc non Af Amer: >90 mL/min (ref >90)
Glucose, Bld: 168 mg/dL — ABNORMAL HIGH (ref 70–99)
POTASSIUM: 3.9 meq/L (ref 3.7–5.3)
Sodium: 138 mEq/L (ref 137–147)
TOTAL PROTEIN: 4.6 g/dL — AB (ref 6.0–8.3)

## 2013-05-05 LAB — TESTOSTERONE: Testosterone: 32 ng/dL — ABNORMAL LOW (ref 300–890)

## 2013-05-05 LAB — CLOSTRIDIUM DIFFICILE BY PCR: Toxigenic C. Difficile by PCR: NEGATIVE

## 2013-05-05 LAB — MAGNESIUM: Magnesium: 1.7 mg/dL (ref 1.5–2.5)

## 2013-05-05 MED ORDER — TRACE MINERALS CR-CU-F-FE-I-MN-MO-SE-ZN IV SOLN
INTRAVENOUS | Status: DC
  Administered 2013-05-05: 18:00:00 via INTRAVENOUS
  Filled 2013-05-05: qty 2000

## 2013-05-05 MED ORDER — FAT EMULSION 20 % IV EMUL
250.0000 mL | INTRAVENOUS | Status: DC
  Administered 2013-05-05: 250 mL via INTRAVENOUS
  Filled 2013-05-05: qty 250

## 2013-05-05 MED ORDER — INSULIN ASPART 100 UNIT/ML ~~LOC~~ SOLN
0.0000 [IU] | SUBCUTANEOUS | Status: DC
  Administered 2013-05-05: 4 [IU] via SUBCUTANEOUS
  Administered 2013-05-05 (×2): 7 [IU] via SUBCUTANEOUS
  Administered 2013-05-06 (×2): 4 [IU] via SUBCUTANEOUS
  Administered 2013-05-06: 7 [IU] via SUBCUTANEOUS

## 2013-05-05 MED ORDER — POTASSIUM CHLORIDE 10 MEQ/100ML IV SOLN
10.0000 meq | INTRAVENOUS | Status: AC
  Administered 2013-05-05 (×4): 10 meq via INTRAVENOUS
  Filled 2013-05-05 (×4): qty 100

## 2013-05-05 MED ORDER — MAGNESIUM SULFATE IN D5W 10-5 MG/ML-% IV SOLN
1.0000 g | Freq: Once | INTRAVENOUS | Status: AC
  Administered 2013-05-05: 1 g via INTRAVENOUS
  Filled 2013-05-05: qty 100

## 2013-05-05 NOTE — Progress Notes (Signed)
PARENTERAL NUTRITION CONSULT NOTE - Follow-up  Pharmacy Consult for TNA Indication: malnutrition in setting of Non-Hodgkin's Lymphoma with abd pain, nausea, and oral mucositis  Allergies  Allergen Reactions  . Aspartame And Phenylalanine Diarrhea  . Codeine Other (See Comments)    "Made me go wild when I was young"    Patient Measurements: Height: 5\' 9"  (175.3 cm) Weight: 168 lb 14.4 oz (76.613 kg) IBW/kg (Calculated) : 70.7 . Vital Signs: Temp: 98.1 F (36.7 C) (03/18 0545) Temp src: Axillary (03/18 0545) BP: 122/72 mmHg (03/18 0545) Pulse Rate: 94 (03/18 0545) Intake/Output from previous day: 03/17 0701 - 03/18 0700 In: 1410 [P.O.:360; I.V.:400; IV Piggyback:100; TPN:550] Out: 2075 [Urine:2075] Intake/Output from this shift:    Labs:  Recent Labs  05/03/13 0445 05/04/13 0603 05/05/13 0535  WBC 0.4* 1.3* 2.7*  HGB 9.5* 9.7* 9.2*  HCT 26.6* 26.8* 26.9*  PLT 22* 18* 24*     Recent Labs  05/03/13 0445 05/04/13 0603 05/05/13 0535  NA 137 137 138  K 3.0* 3.5* 3.9  CL 101 102 105  CO2 24 24 22   GLUCOSE 155* 191* 168*  BUN 18 17 17   CREATININE 0.45* 0.45* 0.45*  CALCIUM 8.0* 8.0* 8.2*  MG 1.5 1.5 1.7  PHOS 2.5 3.1  --   PROT 4.7* 4.7* 4.6*  ALBUMIN 2.0* 2.0* 2.1*  AST 21 14 15   ALT 42 31 32  ALKPHOS 137* 128* 128*  BILITOT 0.8 0.6 0.5  PREALBUMIN 15.7*  --   --   TRIG 149  --   --    Estimated Creatinine Clearance: 92.1 ml/min (by C-G formula based on Cr of 0.45).    Recent Labs  05/04/13 2026 05/05/13 0050 05/05/13 0404  GLUCAP 207* 158* 163*    Insulin Requirements in the past 24 hours:  11 units moderate SSI, 15 units/L insulin in TNA, 25 units Lantus.    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  Malnourished per RD assessment 3/9 and oral intake is reportedly still poor secondary to abdominal discomfort and possibly oral mucositis.  Noted hx of insulin-dependent DM.   Renal: SCr 0.45- stable, CrCl ~90 ml/min CG, UOP great at 1.1 mL/kg/hr Lytes: K 3.9 (rec'd ~78 mEq KCl on 3/17 between IVF and Kruns). Magnesium 1.7 (rec'd 1g Mag sulfate on 3/17). CCa= 9.72. Other lytes stable WNL. LFTs: most WNL, alk phos remains slightly increased Endo: Hx insulin dependent DM.  CBGs last 24 hours (158-201) TGs: Slightly elevated 169 (3/14) Pre-albumin: Low - 10.7 (3/71)  6/96: No complications with TNA per RN. With poor appetite, but patient remains on methylphenidate 5mg  TID which can decrease appetite. MD started Megace 400 mg daily yesterday. Mucositis improving per MD. Evaristo Bury stable. CBGs remain above goal.  CBC slowly improving  TNA Access: Central via implanted port. TNA day# 5  Plan: At 1800 tonight: - Increase Clinimix E  5/15 to goal rate of 80 ml/hr  - Continue Fat Emulsion 20% at 10 ml/hr - Multivitamins in TNA daily - Trace Elements in TNA MWF only due to national shortages - Continue insulin 15 units/L in TNA, Lantus 25 units daily - increase current SSI to resistant scale - Continue to encourage dietary intake by mouth - Give KCl 10 mEq runs x4 - Give Mag sulfate 1g IV - TNA labs Mondays and Thursdays - Pharmacy will follow-up daily  Thank you for the consult.  Johny Drilling, PharmD, BCPS Pager: [phone] Pharmacy: [phone] 05/05/2013 8:18 AM

## 2013-05-05 NOTE — Progress Notes (Signed)
Keith Duffy is still weak. He still not eating. His white cell count is now 2.7. Platelet count is 24,000. Hemoglobin 9.2. He is showing marrow recovery.  Unfortunately, his overall physical status is lagging this. He's just not eating. I do have him on some Megace. Hopefully, he will begin to eat a little better. He still needs TNA.  His mouth looks better. I don't see much in the way of mucositis.  He's having some loose stools. We may have to check him for C. difficile.  Hopefully, physical therapy can try to increase his strength so we can get him home once he begins to keep better.  His vital signs all stable. His blood pressure is 122/72. Temperature 98.1.  His blood sugars have been a little on the higher side but okay from my point of view. On his physical exam, his lungs are clear. Cardiac exam regular rate and rhythm. Abdomen is soft. Bowel sounds are decreased but active. There is no palpable liver or spleen tip. Extremities shows muscle atrophy in upper or lower extremities. Neurological exam shows the right facial nerve deficit.  I did draw some spinal fluid out of his [place] reservoir. I got 4 cc of clear fluid. I did this sterilely. He tolerated this procedure well. We sent the fluid off for cytology.  He still is not ready for discharge. He just is too weak. His not eating. I think if I let him go home now, he will be right back into the hospital.  We Have to also check his stool for C. Difficile.  [name] 41:10

## 2013-05-05 NOTE — Progress Notes (Signed)
Physical Therapy Treatment Patient Details Name: Masaru Chamberlin MRN: [id number] DOB: 1945/03/07 Today's Date: 05/05/2013 Time: 0254-2706 PT Time Calculation (min): 40 min  PT Assessment / Plan / Recommendation  History of Present Illness With a hx of DM and NHL s/p recent admission for chemo 4 days ago who presents to the ED with complaints of worsening generalized weakness and being unable to ambulate on his own. 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  Assisted OOB to [place] as pt cont to have loose uncontrolled stools.  Pt required repeat cueing to stay on task and demon very slow delayed response. Pt required + 2 assist to stand as well as EVA walker for increased support. Pt briefly stood just long enough for hygiene care and switch out [facility] with recliner behind him before he sat.  Getting OOB to [facility] then to recliner completely exhausted him.  Positioned with pillows for comfort.  Plan is for pt to return home.   At this point would need ambulance transport.   Follow Up Recommendations  Home health PT (if spouse agrees)     Does the patient have the potential to tolerate intense rehabilitation     Barriers to Discharge        Equipment Recommendations  3in1 (PT)    Recommendations for Other Services    Frequency Min 3X/week   Progress towards PT Goals Progress towards PT goals: Progressing toward goals  Plan      Precautions / Restrictions Precautions Precautions: None Restrictions Weight Bearing Restrictions: No   Pertinent Vitals/Pain     Mobility  Bed Mobility Overal bed mobility: Needs Assistance Bed Mobility: Supine to Sit Supine to sit: Mod assist General bed mobility comments: increased time and HOB elevated.  Very weak.   Transfers Overall transfer level: Needs assistance Equipment used:  (EVA walker) Transfers: Sit to/from [employer] Sit to  Stand: +2 physical assistance;+2 safety/equipment;Max assist Squat pivot transfers: Max assist General transfer comment: Assisted fron bed to [facility] partial pivot.  Pt required MAX VC's on proper tech and assist to complete turn.   Ambulation/Gait General Gait Details: using EVA walker for increased support pt was unable to functionally take steps due to MAX weakness throughout and delayed cognition to follow instructions given.  Briefly stood long enough to assist with hygiene after BM and switch out [facility] and recliner behind him.         PT Goals (current goals can now be found in the care plan section)    Visit Information  Last PT Received On: 05/05/13 Assistance Needed: +1 History of Present Illness: With a hx of DM and NHL s/p recent admission for chemo 4 days ago who presents to the ED with complaints of worsening generalized weakness and being unable to ambulate on his own. 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 

## 2013-05-05 NOTE — Progress Notes (Signed)
I have reviewed and agree with nutrition follow up note completed by Kallie Locks, [place] Clinical Dietitian [phone]

## 2013-05-05 NOTE — Progress Notes (Signed)
NUTRITION FOLLOW UP  Intervention:    TPN per PharmD   Continue to recommend Chocolate Ensure Complete BID, each supplement provides 350 kcal and 13 grams protein.  Will continue to monitor  Nutrition Dx:   Inadequate oral intake related to abd pain/nausea as evidenced by PO intake <75% for 7 days, ongoing  Goal:   Pt to meet >/= 90% of their estimated nutrition needs, currently unmet  Monitor:   Total protein/energy intake, labs, weights, GI profile, TPN, diet advancement  Assessment:   With a hx of DM and NHL s/p recent admission for chemo 4 days ago who presents to the ED with complaints of worsening generalized weakness and being unable to ambulate on his own. The patient also reported nausea with decreased appetite. In the Ed, the patient was noted to have dry mucus membranes. Renal function was normal. The patient was noted again to be unable to stand in the ED.  From previous nutrition note on 3/9:  -Pt reported minimal intake for past 7 days d/t abd pain. Unable to tolerate any solids or liquids. Confirmed minimal appetite prior to last week period. PO intake ranged from 25-100% during previous admit in 2/28  -Received chemo treatments 4 days ago  -Was drinking chocolate Ensure BID but has been unable to to tolerate  -On clear liquid diet. Pt with 0% PO intake. Tried to eat broth and jello but this caused more nausea and abd pain  -Pt reported usual body weight around 155 lbs. Felt that he has lost weight in past week from minimal PO intake. Current weight may not be accurate d/t edemas  -Pt cannot tolerate aspartame. Causes loose stools per wife   3/13:  Per Oncology, patient started on TPN today, 3/13.  Patient is an appropriate candidate for TPN as patient with prolonged nausea, vomiting, and diarrhea and altered GI function.  Per conversation with pharmacist, Clinimix-E 5/15 will start tonight at 40 ml/hr with 20% lipids at 10 ml/hr to provide 1162 kcal (55% of minimum  estimated needs) and 48 grams protein (56% of minimum estimated needs). As tolerated, rate will increase to goal of 80 ml/hr to provide 1843 kcal (88% of minimum estimated needs) and 96 grams protein (100% of minimum estimated needs). If glucose control is reasonable and PO intake is still minimal will then transition to Clinimix-E 5/20 at 80 ml/hr with 20% lipids at 10 ml/hr (provides 2170 kcal-103% of minimum estimated needs- and 96 grams protein-113% of minimum estimated needs) to better meet calorie needs. Insulin will be be added to TPN and adjusted as needed. Refeeding labs to be monitored.   Patient was sleeping upon dietetic intern visit. RD spoke with patient's wife at bedside. Patients wife stated that patient can not tolerate any food or liquids other than sips of water due to mouth sores that extend down his esophagus. Wife reported encouraging patient to try different PO supplements and foods (jello, broths). Wife stated that patient is unable to tolerate and will only take 1 bite. RD offered the option to get patient other foods available in the cafeteria and provided aspartame free Boost Glucose Control beverage for patient to try once awake.  Recommend initiating appetite stimulant so that once mouth sores begin healing, patient will want to start eating a PO diet. Pharmacy contacted MD, who reported that appetite stimulant may be consider. Will also modify Ritalin as this may also suppress appetite   Potassium low at 2.9 mEq/L, receiving potassium chloride  No new  magnesium and phosphorus labs  Calcium low at 7.8 mg/dL  CBGs elevated 179 mg/dL, 162 mg/dL, and 166 mg/dL   3/16:  -Per pharmacy, plan to continue Clinimix 5/15 at 60 ml/hr to wait for CBG's to stabilize. Lipids at 20% at 10 ml/hr with MVI daily. TE MWF d/t national shortage  -CBG's improving with Lantus 25 units daily and insulin adjustment. 159-229 mg/dl in past 24 hours  -Goal to transition to Clinimix 5/20 at 80 ml/hr to  provide 2170 kcal, and 96 gram protein (100% est kcal and protein needs)  -Phosphorus and Magnesium WNL  -Potassium 3.0, receiving potassium chloride and KCL  -MD noted some improvement in patient's oral mucosa. Receiving sodium bicarbonate mouthwash  -Having formed stools, on Colace  -Minimal PO intake, not consuming Ensure Complete. Pt has just started being able to tolerate water today per wife.  -Family brought in [place] yesterday, which pt consumed 100%. Goal for pt today is to consume Boost Glucose control supplement today, along with additional Genene Churn that family provided.  -Offered milkshakes. Pt in agreement to try one tomorrow as he already has [place] planned for today.  -Pt not able to tolerate any solid foods d/t mouth pains   3/18- -Pt reports mouth pains have improved. Pt was able to consume ensure and ice cream this AM. Wife reports pt has been improving on his PO intake with ingestion of strawberry yogurt and milk last night. Pt was encouraged to increase PO intake. Pt reports not liking the chocolate milkshakes and the Boost Glucose control supplement.  -Pt started on Megace yesterday (3/17). -Pt denies any stomach pains, nausea, or difficulties with chewing or swallowing. -Pt with ongoing loose stools, MD will check for C. Diff.  -Pt has been tolerating TPN. Current TPN Clinimix-E 5/15 running at 60 ml/hr with fat emulsion 20% at 10 ml/hr to provide 1502 kcals (72% of estimated minimum kcal needs) and 72 grams of protein (85% of estimated minimum protein needs). -Per Pharmacy note, plans to increase at 1800 tonight TPN Clinimix E 5/15 to goal rate of 80 ml/hr with continues fat emulsion 20% at 10 ml/hr to provide 1843 kcals (88% of estimated minimum kcals needs) and 96 grams of protein (100% of estimated minimum protein needs.  Labs: Low creatinine, calcium, total protein, albumin High glucose (168 mg/dL) and alkaline phosphatase  Magnesium, phosphorus, and potassium are  WNL.  Pt continues to meet criteria for severe MALNUTRITION in the context of chronic illness as evidenced by PO intake <75% est nutrition needs for > one month, severe muscle wasting and moderate subcutaneous fat loss.  Height: Ht Readings from Last 1 Encounters:  04/25/13 5\' 9"  (1.753 m)    Weight Status:   Wt Readings from Last 1 Encounters:  04/25/13 168 lb 14.4 oz (76.613 kg)  04/14/13 155 lb (70.3 kg)  -Wt from 3/8 is falsely elevated due to edema  Re-estimated needs:  Kcal: 2100-2300  Protein: 85-100 gram  Fluid: >/=2100 ml/daily  Skin: +1 RLE edema, +1 LLE edema   Diet Order: General   Intake/Output Summary (Last 24 hours) at 05/05/13 1524 Last data filed at 05/05/13 1518  Gross per 24 hour  Intake   3540 ml  Output   1975 ml  Net   1565 ml    Last BM: 3/17- loose   Labs:   Recent Labs Lab 05/02/13 0530 05/03/13 0445 05/04/13 0603 05/05/13 0535  NA 137 137 137 138  K 3.7 3.0* 3.5* 3.9  CL 103  101 102 105  CO2 22 24 24 22   BUN 18 18 17 17   CREATININE 0.46* 0.45* 0.45* 0.45*  CALCIUM 8.2* 8.0* 8.0* 8.2*  MG 1.7 1.5 1.5 1.7  PHOS 2.4 2.5 3.1  --   GLUCOSE 228* 155* 191* 168*    CBG (last 3)   Recent Labs  05/05/13 0404 05/05/13 0804 05/05/13 1159  GLUCAP 163* 158* 196*    Scheduled Meds: . artificial tears   Right Eye 3 times per day  . chlorhexidine  10 mL Mouth/Throat QID  . feeding supplement (ENSURE COMPLETE)  237 mL Oral BID BM  . fentaNYL  12.5 mcg Transdermal Q72H  . filgrastim  780 mcg Subcutaneous Daily  . insulin aspart  0-20 Units Subcutaneous 6 times per day  . insulin glargine  25 Units Subcutaneous Daily  . magnesium sulfate 1 - 4 g bolus IVPB  1 g Intravenous Once  . megestrol  400 mg Oral Daily  . methylphenidate  5 mg Oral TID WC  . metoprolol tartrate  25 mg Oral BID  . pantoprazole (PROTONIX) IV  40 mg Intravenous Q12H  . potassium chloride  10 mEq Intravenous Q1 Hr x 4  . saccharomyces boulardii  250 mg Oral BID   . sodium bicarbonate/sodium chloride  1 application Mouth Rinse 6 X Daily  . sodium chloride  10-40 mL Intracatheter Q12H    Continuous Infusions: . 0.9 % NaCl with KCl 40 mEq / L 50 mL/hr at 05/04/13 0556  . Marland KitchenTPN (CLINIMIX-E) Adult 60 mL/hr at 05/04/13 1734   And  . fat emulsion 250 mL (05/04/13 1734)  . Marland KitchenTPN (CLINIMIX-E) Adult     And  . fat emulsion      Kallie Locks Dietetic Intern Pager: [phone]

## 2013-05-06 LAB — COMPREHENSIVE METABOLIC PANEL
ALT: 32 U/L (ref 0–53)
AST: 14 U/L (ref 0–37)
Albumin: 2.1 g/dL — ABNORMAL LOW (ref 3.5–5.2)
Alkaline Phosphatase: 120 U/L — ABNORMAL HIGH (ref 39–117)
BUN: 16 mg/dL (ref 6–23)
CO2: 21 mEq/L (ref 19–32)
Calcium: 8.1 mg/dL — ABNORMAL LOW (ref 8.4–10.5)
Chloride: 106 mEq/L (ref 96–112)
Creatinine, Ser: 0.49 mg/dL — ABNORMAL LOW (ref 0.50–1.35)
GFR calc Af Amer: >90 mL/min (ref >90)
GFR calc non Af Amer: >90 mL/min (ref >90)
Glucose, Bld: 184 mg/dL — ABNORMAL HIGH (ref 70–99)
Potassium: 4.1 mEq/L (ref 3.7–5.3)
Sodium: 137 mEq/L (ref 137–147)
TOTAL PROTEIN: 4.5 g/dL — AB (ref 6.0–8.3)
Total Bilirubin: 0.5 mg/dL (ref 0.3–1.2)

## 2013-05-06 LAB — CBC WITH DIFFERENTIAL/PLATELET
BASOS ABS: 0 10*3/uL (ref 0.0–0.1)
Basophils Relative: 0 % (ref 0–1)
EOS PCT: 5 % (ref 0–5)
Eosinophils Absolute: 0.2 10*3/uL (ref 0.0–0.7)
HCT: 24.8 % — ABNORMAL LOW (ref 39.0–52.0)
Hemoglobin: 8.6 g/dL — ABNORMAL LOW (ref 13.0–17.0)
LYMPHS ABS: 0.2 10*3/uL — AB (ref 0.7–4.0)
LYMPHS PCT: 6 % — AB (ref 12–46)
MCH: 30.4 pg (ref 26.0–34.0)
MCHC: 34.7 g/dL (ref 30.0–36.0)
MCV: 87.6 fL (ref 78.0–100.0)
Monocytes Absolute: 0.5 10*3/uL (ref 0.1–1.0)
Monocytes Relative: 14 % — ABNORMAL HIGH (ref 3–12)
Neutro Abs: 3 10*3/uL (ref 1.7–7.7)
Neutrophils Relative %: 75 % (ref 43–77)
PLATELETS: 27 10*3/uL — AB (ref 150–400)
RBC: 2.83 MIL/uL — AB (ref 4.22–5.81)
RDW: 15.4 % (ref 11.5–15.5)
WBC MORPHOLOGY: INCREASED
WBC: 3.9 10*3/uL — AB (ref 4.0–10.5)

## 2013-05-06 LAB — GLUCOSE, CAPILLARY
GLUCOSE-CAPILLARY: 162 mg/dL — AB (ref 70–99)
GLUCOSE-CAPILLARY: 178 mg/dL — AB (ref 70–99)
GLUCOSE-CAPILLARY: 121 mg/dL — AB (ref 70–99)
Glucose-Capillary: 203 mg/dL — ABNORMAL HIGH (ref 70–99)
Glucose-Capillary: 189 mg/dL — ABNORMAL HIGH (ref 70–99)

## 2013-05-06 LAB — MAGNESIUM: MAGNESIUM: 1.9 mg/dL (ref 1.5–2.5)

## 2013-05-06 LAB — PHOSPHORUS: PHOSPHORUS: 2.5 mg/dL (ref 2.3–4.6)

## 2013-05-06 MED ORDER — KETOCONAZOLE 200 MG PO TABS
100.0000 mg | ORAL_TABLET | Freq: Every day | ORAL | Status: DC
  Administered 2013-05-06 – 2013-05-09 (×4): 100 mg via ORAL
  Filled 2013-05-06 (×5): qty 1

## 2013-05-06 MED ORDER — INSULIN ASPART 100 UNIT/ML ~~LOC~~ SOLN
0.0000 [IU] | Freq: Three times a day (TID) | SUBCUTANEOUS | Status: DC
  Administered 2013-05-06: 3 [IU] via SUBCUTANEOUS
  Administered 2013-05-07: 11 [IU] via SUBCUTANEOUS
  Administered 2013-05-07: 4 [IU] via SUBCUTANEOUS
  Administered 2013-05-07: 7 [IU] via SUBCUTANEOUS
  Administered 2013-05-08: 4 [IU] via SUBCUTANEOUS
  Administered 2013-05-08: 7 [IU] via SUBCUTANEOUS
  Administered 2013-05-08 (×2): 11 [IU] via SUBCUTANEOUS
  Administered 2013-05-09: 4 [IU] via SUBCUTANEOUS
  Administered 2013-05-09 (×3): 11 [IU] via SUBCUTANEOUS
  Administered 2013-05-10: 4 [IU] via SUBCUTANEOUS

## 2013-05-06 MED ORDER — LOPERAMIDE HCL 2 MG PO CAPS
2.0000 mg | ORAL_CAPSULE | ORAL | Status: DC | PRN
  Administered 2013-05-07: 2 mg via ORAL
  Filled 2013-05-06: qty 1

## 2013-05-06 MED ORDER — PANTOPRAZOLE SODIUM 40 MG PO TBEC
40.0000 mg | DELAYED_RELEASE_TABLET | Freq: Every day | ORAL | Status: DC
  Administered 2013-05-07 – 2013-05-09 (×3): 40 mg via ORAL
  Filled 2013-05-06 (×4): qty 1

## 2013-05-06 NOTE — Care Management Note (Signed)
Cm spoke with patient and spouse at the bedside concerning dc planning. Cm informed patient and spouse of services provided by [facility] agencies. Cm informed patient of PT's recommendation of 3N1. Per pt's spouse will further discuss possibility of using HH as a resource. No choice made at this time. Pt's spouse will be pt's primary care taker.    Venita Lick Molley Houser,MSN,RN [phone]

## 2013-05-06 NOTE — Progress Notes (Signed)
Foley catheter removed

## 2013-05-06 NOTE — Progress Notes (Signed)
Inpatient Diabetes Program Recommendations  AACE/ADA: New Consensus Statement on Inpatient Glycemic Control (2013)  Target Ranges:  Prepandial:   less than 140 mg/dL      Peak postprandial:   less than 180 mg/dL (1-2 hours)      Critically ill patients:  140 - 180 mg/dL   Reason for Visit: Hyperglycema  Diabetes history: DM2 Outpatient Diabetes medications: Lantus 25 units QHS and Novolog S/S Current orders for Inpatient glycemic control: Lantus 25 units QAM and Novolog resistant tidwc  68 y.o. male with a hx of DM and NHL s/p recent admission for chemo 4 days ago admitted with complaints of worsening generalized weakness and being unable to ambulate on his own.  TNA @ 80/hour with 15 units regular insulin/liter. Lantus increased to 25 units QAM and Novolog increased to resistant tidwc.  Pt eating <10% meals.  Blood sugars improving this am. May want to consider changing Novolog to Q4H while intake is poor.  Will continue to follow.  Thank you. Lorenda Peck, RD, LDN, CDE Inpatient Diabetes Coordinator [phone]

## 2013-05-06 NOTE — Progress Notes (Signed)
He is making slow progress. He's eating a little bit better. We'll stop the TNA.  He's not neutropenic. White cell count has come up slowly. It is 3.9 today. I will stop the Neulasta.  He still very weak. We are trying physical therapy. I am not sure how successful this is. His wife is doing a great job with him.  The still some loose stool. His C. difficile is negative. We'll try some Imodium.  He's not hurting. His mouth is healing up. It looks better on exam.  There is no cough. He's not short of breath.  There is no nausea or vomiting.  His cytology on the spinal fluid came back negative.  On his physical exam his vital signs show temperature 97.9. Blood pressure 130/60. Pulse of 93. Oral exam shows improved mucositis. There is still the right cranial nerve palsy. Lungs are clear. Cardiac exam regular in rhythm. Abdomen is is soft. Bowel sounds are active. No tenderness. Extremities shows atrophied in his arms or legs. Skin exam somewhat dry. Neurological exam shows some slight confusion.  Hopefully, we will be able to let him go tomorrow. He really wants to go home. I just don't think marginal oral intake. hopefully we will find that he is eating better tomorrow and we can let him go home.

## 2013-05-07 DIAGNOSIS — R32 Unspecified urinary incontinence: Secondary | ICD-10-CM

## 2013-05-07 LAB — COMPREHENSIVE METABOLIC PANEL
ALT: 35 U/L (ref 0–53)
AST: 18 U/L (ref 0–37)
Albumin: 2.1 g/dL — ABNORMAL LOW (ref 3.5–5.2)
Alkaline Phosphatase: 121 U/L — ABNORMAL HIGH (ref 39–117)
BUN: 14 mg/dL (ref 6–23)
CALCIUM: 8 mg/dL — AB (ref 8.4–10.5)
CO2: 21 mEq/L (ref 19–32)
Chloride: 107 mEq/L (ref 96–112)
Creatinine, Ser: 0.49 mg/dL — ABNORMAL LOW (ref 0.50–1.35)
GFR calc non Af Amer: >90 mL/min (ref >90)
GLUCOSE: 130 mg/dL — AB (ref 70–99)
Potassium: 4 mEq/L (ref 3.7–5.3)
Sodium: 139 mEq/L (ref 137–147)
Total Bilirubin: 0.4 mg/dL (ref 0.3–1.2)
Total Protein: 4.6 g/dL — ABNORMAL LOW (ref 6.0–8.3)

## 2013-05-07 LAB — GLUCOSE, CAPILLARY
GLUCOSE-CAPILLARY: 253 mg/dL — AB (ref 70–99)
Glucose-Capillary: 95 mg/dL (ref 70–99)
Glucose-Capillary: 190 mg/dL — ABNORMAL HIGH (ref 70–99)
Glucose-Capillary: 222 mg/dL — ABNORMAL HIGH (ref 70–99)

## 2013-05-07 LAB — CBC WITH DIFFERENTIAL/PLATELET
BASOS PCT: 0 % (ref 0–1)
Basophils Absolute: 0 10*3/uL (ref 0.0–0.1)
EOS ABS: 0.1 10*3/uL (ref 0.0–0.7)
Eosinophils Relative: 3 % (ref 0–5)
HCT: 23.4 % — ABNORMAL LOW (ref 39.0–52.0)
Hemoglobin: 8.2 g/dL — ABNORMAL LOW (ref 13.0–17.0)
Lymphocytes Relative: 8 % — ABNORMAL LOW (ref 12–46)
Lymphs Abs: 0.3 10*3/uL — ABNORMAL LOW (ref 0.7–4.0)
MCH: 30.7 pg (ref 26.0–34.0)
MCHC: 35 g/dL (ref 30.0–36.0)
MCV: 87.6 fL (ref 78.0–100.0)
MONO ABS: 0.5 10*3/uL (ref 0.1–1.0)
Monocytes Relative: 12 % (ref 3–12)
NEUTROS PCT: 77 % (ref 43–77)
Neutro Abs: 3.4 10*3/uL (ref 1.7–7.7)
Platelets: 40 10*3/uL — ABNORMAL LOW (ref 150–400)
RBC: 2.67 MIL/uL — ABNORMAL LOW (ref 4.22–5.81)
RDW: 15.8 % — ABNORMAL HIGH (ref 11.5–15.5)
WBC: 4.3 10*3/uL (ref 4.0–10.5)

## 2013-05-07 LAB — PREPARE RBC (CROSSMATCH)

## 2013-05-07 MED ORDER — SODIUM CHLORIDE 0.9 % IJ SOLN: 10.0000 mL | INTRAMUSCULAR | Status: DC | PRN

## 2013-05-07 MED ORDER — ENSURE PUDDING PO PUDG
1.0000 | Freq: Three times a day (TID) | ORAL | Status: DC
  Administered 2013-05-07 – 2013-05-09 (×6): 1 via ORAL
  Filled 2013-05-07 (×11): qty 1

## 2013-05-07 MED ORDER — SODIUM CHLORIDE 0.9 % IV SOLN
20.0000 mg | Freq: Two times a day (BID) | INTRAVENOUS | Status: DC
  Administered 2013-05-07 – 2013-05-09 (×6): 20 mg via INTRAVENOUS
  Filled 2013-05-07 (×8): qty 2

## 2013-05-07 MED ORDER — SODIUM CHLORIDE 0.9 % IV SOLN
250.0000 mL | Freq: Once | INTRAVENOUS | Status: AC
  Administered 2013-05-07: 250 mL via INTRAVENOUS

## 2013-05-07 MED ORDER — BENEPROTEIN PO POWD
1.0000 | Freq: Three times a day (TID) | ORAL | Status: DC
  Filled 2013-05-07: qty 227

## 2013-05-07 MED ORDER — MIRTAZAPINE 15 MG PO TABS
15.0000 mg | ORAL_TABLET | Freq: Every morning | ORAL | Status: DC
  Administered 2013-05-07 – 2013-05-09 (×3): 15 mg via ORAL
  Filled 2013-05-07 (×4): qty 1

## 2013-05-07 MED ORDER — RESOURCE INSTANT PROTEIN PO PWD PACKET
6.0000 g | Freq: Three times a day (TID) | ORAL | Status: DC
  Administered 2013-05-07 – 2013-05-08 (×2): 6 g via ORAL
  Filled 2013-05-07 (×5): qty 6

## 2013-05-07 MED ORDER — SODIUM CHLORIDE 0.9 % IJ SOLN: 3.0000 mL | INTRAMUSCULAR | Status: DC | PRN

## 2013-05-07 MED ORDER — FUROSEMIDE 10 MG/ML IJ SOLN
10.0000 mg | Freq: Once | INTRAMUSCULAR | Status: AC
  Administered 2013-05-08: 10 mg via INTRAVENOUS
  Filled 2013-05-07: qty 1

## 2013-05-07 MED ORDER — HEPARIN SOD (PORK) LOCK FLUSH 100 UNIT/ML IV SOLN: 250.0000 [IU] | INTRAVENOUS | Status: DC | PRN

## 2013-05-07 MED ORDER — HEPARIN SOD (PORK) LOCK FLUSH 100 UNIT/ML IV SOLN: 500.0000 [IU] | Freq: Every day | INTRAVENOUS | Status: DC | PRN

## 2013-05-07 MED ORDER — ACETAMINOPHEN 325 MG PO TABS
650.0000 mg | ORAL_TABLET | Freq: Once | ORAL | Status: AC
  Administered 2013-05-07: 650 mg via ORAL
  Filled 2013-05-07: qty 2

## 2013-05-07 MED ORDER — DEXAMETHASONE SODIUM PHOSPHATE 4 MG/ML IJ SOLN
40.0000 mg | INTRAMUSCULAR | Status: DC
  Filled 2013-05-07: qty 10

## 2013-05-07 NOTE — Progress Notes (Signed)
He had a pretty rough day yesterday. He just is not eating. We stopped the TNA. He is for catheter out. He's incontinent. I think this is just because he is so weak.  I will try some Decadron. I will try some Remeron.  we just have to try to get his appetite going. This is always that we can get him home.  It is hard to know if he will improve. I am unsure if there is an element of depression. If so, then maybe the Remeron will help.  He's had no fever. He's had no nausea or vomiting. His mouth is dry. Will try to increase his IV fluid will but.  His vital signs still looked okay. Temperature 97.2. Blood pressure 144/66.. Pulse is 86. Oral cavity is dry. No mucositis. He still has the right facial nerve deficit. Lungs are clear. Cardiac exam regular in rhythm. Abdomen is soft. Bowel sounds are active. There is no fluid wave. Extremities shows most likely an upper and lower extremity. Strength is 3/5 bilaterally. Skin exam shows some scattered ecchymoses. Neurological exam shows the right cranial nerve 7 deficit. As the left foot drop. Otherwise reveals is unremarkable.  His white cell count 4.3. He will let 0.2. Platelet count 40. Creatinine 0.49. Calcium 8.0.  Our goal now is to try to improve his appetite and nutritional state. This is the only way that I can see for him to be able to go home.  His spinal fluid cytology still negative which is very helpful.  He just is not ready for discharge.  I very much appreciate the outstanding care that he is getting from all the staff on 3 E. His wife is very thankful for the compassionate care that he is getting.   [name]  1 Cor 10:13

## 2013-05-07 NOTE — Progress Notes (Signed)
NUTRITION FOLLOW UP  Intervention:    Provide Beneprotein powder supplement.  Provide Ensure chocolate pudding TID between meals, each container provides 170 kcals and 4 grams of protein.  Continue to provide chocolate Ensure Complete BID, each supplement provides 350 kcal and 13 grams protein.   Will continue to monitor  Nutrition Dx:   Inadequate oral intake related to abd pain/nausea as evidenced by PO intake <75% for 7 days, ongoing  Goal:   Pt to meet >/= 90% of their estimated nutrition needs, currently unmet  Monitor:   Total protein/energy intake, labs, weights, GI profile  Assessment:   With a hx of DM and NHL s/p recent admission for chemo 4 days ago who presents to the ED with complaints of worsening generalized weakness and being unable to ambulate on his own. The patient also reported nausea with decreased appetite. In the Ed, the patient was noted to have dry mucus membranes. Renal function was normal. The patient was noted again to be unable to stand in the ED.   From previous nutrition note on 3/9:  -Pt reported minimal intake for past 7 days d/t abd pain. Unable to tolerate any solids or liquids. Confirmed minimal appetite prior to last week period. PO intake ranged from 25-100% during previous admit in 2/28  -Received chemo treatments 4 days ago  -Was drinking chocolate Ensure BID but has been unable to to tolerate  -On clear liquid diet. Pt with 0% PO intake. Tried to eat broth and jello but this caused more nausea and abd pain  -Pt reported usual body weight around 155 lbs. Felt that he has lost weight in past week from minimal PO intake. Current weight may not be accurate d/t edemas  -Pt cannot tolerate aspartame. Causes loose stools per wife   3/13:  Per Oncology, patient started on TPN today, 3/13.  Patient is an appropriate candidate for TPN as patient with prolonged nausea, vomiting, and diarrhea and altered GI function.  Per conversation with pharmacist,  Clinimix-E 5/15 will start tonight at 40 ml/hr with 20% lipids at 10 ml/hr to provide 1162 kcal (55% of minimum estimated needs) and 48 grams protein (56% of minimum estimated needs). As tolerated, rate will increase to goal of 80 ml/hr to provide 1843 kcal (88% of minimum estimated needs) and 96 grams protein (100% of minimum estimated needs). If glucose control is reasonable and PO intake is still minimal will then transition to Clinimix-E 5/20 at 80 ml/hr with 20% lipids at 10 ml/hr (provides 2170 kcal-103% of minimum estimated needs- and 96 grams protein-113% of minimum estimated needs) to better meet calorie needs. Insulin will be be added to TPN and adjusted as needed. Refeeding labs to be monitored.   Patient was sleeping upon dietetic intern visit. RD spoke with patient's wife at bedside. Patients wife stated that patient can not tolerate any food or liquids other than sips of water due to mouth sores that extend down his esophagus. Wife reported encouraging patient to try different PO supplements and foods (jello, broths). Wife stated that patient is unable to tolerate and will only take 1 bite. RD offered the option to get patient other foods available in the cafeteria and provided aspartame free Boost Glucose Control beverage for patient to try once awake.  Recommend initiating appetite stimulant so that once mouth sores begin healing, patient will want to start eating a PO diet. Pharmacy contacted MD, who reported that appetite stimulant may be consider. Will also modify Ritalin as this  may also suppress appetite  Potassium low at 2.9 mEq/L, receiving potassium chloride  No new magnesium and phosphorus labs  Calcium low at 7.8 mg/dL  CBGs elevated 179 mg/dL, 162 mg/dL, and 166 mg/dL   3/16:  -Per pharmacy, plan to continue Clinimix 5/15 at 60 ml/hr to wait for CBG's to stabilize. Lipids at 20% at 10 ml/hr with MVI daily. TE MWF d/t national shortage  -CBG's improving with Lantus 25 units daily  and insulin adjustment. 159-229 mg/dl in past 24 hours  -Goal to transition to Clinimix 5/20 at 80 ml/hr to provide 2170 kcal, and 96 gram protein (100% est kcal and protein needs)  -Phosphorus and Magnesium WNL  -Potassium 3.0, receiving potassium chloride and KCL  -MD noted some improvement in patient's oral mucosa. Receiving sodium bicarbonate mouthwash  -Having formed stools, on Colace  -Minimal PO intake, not consuming Ensure Complete. Pt has just started being able to tolerate water today per wife.  -Family brought in Dazey yesterday, which pt consumed 100%. Goal for pt today is to consume Boost Glucose control supplement today, along with additional Genene Churn that family provided.  -Offered milkshakes. Pt in agreement to try one tomorrow as he already has [place] planned for today.  -Pt not able to tolerate any solid foods d/t mouth pains   3/18-  -Pt reports mouth pains have improved. Pt was able to consume ensure and ice cream this AM. Wife reports pt has been improving on his PO intake with ingestion of strawberry yogurt and milk last night. Pt was encouraged to increase PO intake.  Pt reports not liking the chocolate milkshakes and the Boost Glucose control supplement.  -Pt started on Megace yesterday (3/17).  -Pt denies any stomach pains, nausea, or difficulties with chewing or swallowing.  -Pt with ongoing loose stools, MD will check for C. Diff.   -Pt has been tolerating TPN. Current TPN Clinimix-E 5/15 running at 60 ml/hr with fat emulsion 20% at 10 ml/hr to provide 1502 kcals (72% of estimated minimum kcal needs) and 72 grams of protein (85% of estimated minimum protein needs).  -Per Pharmacy note, plans to increase at 1800 tonight TPN Clinimix E 5/15 to goal rate of 80 ml/hr with continues fat emulsion 20% at 10 ml/hr to provide 1843 kcals (88% of estimated minimum kcals needs) and 96 grams of protein (100% of estimated minimum protein needs.  3/20- -TPN stopped 3/19. - Pt with  ongoing diarrhea--C. Diff neg -Pt asleep in room during visit, spoke with wife. Pt with improved PO intake (ice cream, half pint of milk, bites of food, some Ensure), but continued poor PO intake. Pt with continued decreased appetite. Pt was put on addition medication (Remeron) to stimulate appetite. Wife has been encouraging pt to eat, but pt refuses at times-Wife reports it may be his depression. Wife is willing to try Beneprotein powder in pt's food as pt has been improving a bit in his PO intake--will order. Wife reports pt also liking pudding--will order Ensure pudding.  -Plan was to d/c yesterday, but due to pt's poor PO intake will need him to stay longer until PO improves more.  Labs: Low creatinine, calcium, total protein, albumin High glucose and alkaline phosphatase  Pt continues to meet criteria for severe MALNUTRITION in the context of chronic illness as evidenced by PO intake <75% est nutrition needs for > one month, severe muscle wasting and moderate subcutaneous fat loss.  Height: Ht Readings from Last 1 Encounters:  04/25/13 5\' 9"  (1.753  m)    Weight Status:   Wt Readings from Last 1 Encounters:  05/06/13 140 lb (63.504 kg)    Re-estimated needs:  Kcal: 2100-2300  Protein: 85-100 gram  Fluid: >/=2100 ml/daily  Skin: +1 RLE edema, +1 LLE edema   Diet Order: General   Intake/Output Summary (Last 24 hours) at 05/07/13 1203 Last data filed at 05/07/13 0600  Gross per 24 hour  Intake   1570 ml  Output    900 ml  Net    670 ml    Last BM: 3/19-diarrhea   Labs:   Recent Labs Lab 05/03/13 0445 05/04/13 0603 05/05/13 0535 05/06/13 0518 05/07/13 0344  NA 137 137 138 137 139  K 3.0* 3.5* 3.9 4.1 4.0  CL 101 102 105 106 107  CO2 24 24 22 21 21   BUN 18 17 17 16 14   CREATININE 0.45* 0.45* 0.45* 0.49* 0.49*  CALCIUM 8.0* 8.0* 8.2* 8.1* 8.0*  MG 1.5 1.5 1.7 1.9  --   PHOS 2.5 3.1  --  2.5  --   GLUCOSE 155* 191* 168* 184* 130*    CBG (last 3)   Recent  Labs  05/06/13 0802 05/06/13 1713 05/06/13 2025  GLUCAP 178* 121* 189*    Scheduled Meds: . artificial tears   Right Eye 3 times per day  . chlorhexidine  10 mL Mouth/Throat QID  . dexamethasone (DECADRON) IVPB  20 mg Intravenous Q12H  . feeding supplement (ENSURE COMPLETE)  237 mL Oral BID BM  . insulin aspart  0-20 Units Subcutaneous TID AC & HS  . insulin glargine  25 Units Subcutaneous Daily  . ketoconazole  100 mg Oral Daily  . megestrol  400 mg Oral Daily  . methylphenidate  5 mg Oral TID WC  . metoprolol tartrate  25 mg Oral BID  . mirtazapine  15 mg Oral q morning - 10a  . pantoprazole  40 mg Oral Daily  . saccharomyces boulardii  250 mg Oral BID  . sodium bicarbonate/sodium chloride  1 application Mouth Rinse 6 X Daily  . sodium chloride  10-40 mL Intracatheter Q12H    Continuous Infusions: . 0.9 % NaCl with KCl 40 mEq / L 75 mL/hr at 05/07/13 [place] Dietetic Intern Pager: [phone]

## 2013-05-07 NOTE — Progress Notes (Signed)
PT Cancellation Note  ___Treatment cancelled today due to medical issues with patient which prohibited therapy  ___ Treatment cancelled today due to patient receiving procedure or test   ___ Treatment cancelled today due to patient's refusal to participate   _X_ Treatment cancelled today due to inability to tolerate.  Rica Koyanagi  PTA [facility]  Acute  Rehab Pager      [phone]

## 2013-05-07 NOTE — Progress Notes (Signed)
I have reviewed and agree with follow up note completed by Kallie Locks, [place] [facility] [place] Clinical Dietitian [phone]

## 2013-05-08 DIAGNOSIS — D61810 Antineoplastic chemotherapy induced pancytopenia: Principal | ICD-10-CM

## 2013-05-08 DIAGNOSIS — T451X5A Adverse effect of antineoplastic and immunosuppressive drugs, initial encounter: Principal | ICD-10-CM

## 2013-05-08 DIAGNOSIS — C8581 Other specified types of non-Hodgkin lymphoma, lymph nodes of head, face, and neck: Secondary | ICD-10-CM

## 2013-05-08 LAB — COMPREHENSIVE METABOLIC PANEL
ALK PHOS: 128 U/L — AB (ref 39–117)
ALT: 29 U/L (ref 0–53)
AST: 13 U/L (ref 0–37)
Albumin: 2.5 g/dL — ABNORMAL LOW (ref 3.5–5.2)
BUN: 21 mg/dL (ref 6–23)
CO2: 19 mEq/L (ref 19–32)
Calcium: 8.7 mg/dL (ref 8.4–10.5)
Chloride: 104 mEq/L (ref 96–112)
Creatinine, Ser: 0.62 mg/dL (ref 0.50–1.35)
GFR calc non Af Amer: >90 mL/min (ref >90)
GLUCOSE: 187 mg/dL — AB (ref 70–99)
POTASSIUM: 4.3 meq/L (ref 3.7–5.3)
Sodium: 139 mEq/L (ref 137–147)
TOTAL PROTEIN: 5.4 g/dL — AB (ref 6.0–8.3)
Total Bilirubin: 0.4 mg/dL (ref 0.3–1.2)

## 2013-05-08 LAB — CBC WITH DIFFERENTIAL/PLATELET
BASOS ABS: 0 10*3/uL (ref 0.0–0.1)
Basophils Relative: 0 % (ref 0–1)
Eosinophils Absolute: 0 10*3/uL (ref 0.0–0.7)
Eosinophils Relative: 0 % (ref 0–5)
HEMATOCRIT: 32.2 % — AB (ref 39.0–52.0)
HEMOGLOBIN: 11.3 g/dL — AB (ref 13.0–17.0)
Lymphocytes Relative: 6 % — ABNORMAL LOW (ref 12–46)
Lymphs Abs: 0.3 10*3/uL — ABNORMAL LOW (ref 0.7–4.0)
MCH: 30.5 pg (ref 26.0–34.0)
MCHC: 35.1 g/dL (ref 30.0–36.0)
MCV: 86.8 fL (ref 78.0–100.0)
Monocytes Absolute: 0.2 10*3/uL (ref 0.1–1.0)
Monocytes Relative: 5 % (ref 3–12)
NEUTROS ABS: 4.3 10*3/uL (ref 1.7–7.7)
Neutrophils Relative %: 89 % — ABNORMAL HIGH (ref 43–77)
Platelets: 51 10*3/uL — ABNORMAL LOW (ref 150–400)
RBC: 3.71 MIL/uL — AB (ref 4.22–5.81)
RDW: 15 % (ref 11.5–15.5)
WBC: 4.8 10*3/uL (ref 4.0–10.5)

## 2013-05-08 LAB — GLUCOSE, CAPILLARY
GLUCOSE-CAPILLARY: 284 mg/dL — AB (ref 70–99)
GLUCOSE-CAPILLARY: 273 mg/dL — AB (ref 70–99)
Glucose-Capillary: 164 mg/dL — ABNORMAL HIGH (ref 70–99)
Glucose-Capillary: 225 mg/dL — ABNORMAL HIGH (ref 70–99)

## 2013-05-08 MED ORDER — RESOURCE INSTANT PROTEIN PO PWD PACKET
6.0000 g | Freq: Three times a day (TID) | ORAL | Status: DC
  Administered 2013-05-09 (×2): 6 g via ORAL
  Filled 2013-05-08 (×8): qty 6

## 2013-05-08 NOTE — Progress Notes (Signed)
Keith Duffy   DOB:[birth year]   XK#:[id number]   CSN#:[id number]   Subjective: patient awake and oriented, wife by bedside, he was easily arousable, still having minimal po intake. Condom catheter removed. Using urinal now. Vitals are stable today   Objective:  Filed Vitals:   05/08/13 1457  BP: 120/62  Pulse: 92  Temp: 98.4 F (36.9 C)  Resp: 16    Body mass index is 20.67 kg/(m^2).  Intake/Output Summary (Last 24 hours) at 05/08/13 1500 Last data filed at 05/08/13 1300  Gross per 24 hour  Intake   1340 ml  Output    900 ml  Net    440 ml     Sclerae unicteric  Oropharynx clear  No peripheral adenopathy  Lungs clear -- no rales or rhonchi  Heart regular rate and rhythm  Abdomen benign  MSK no focal spinal tenderness, no peripheral edema  Neuro nonfocal   CBG (last 3)   Recent Labs  05/07/13 2153 05/08/13 0730 05/08/13 1257  GLUCAP 222* 164* 284*     Labs:  Lab Results  Component Value Date   WBC 4.8 05/08/2013   HGB 11.3* 05/08/2013   HCT 32.2* 05/08/2013   MCV 86.8 05/08/2013   PLT 51* 05/08/2013   NEUTROABS 4.3 05/08/2013    Urine Studies No results found for this basename: UACOL, UAPR, USPG, UPH, UTP, UGL, UKET, UBIL, UHGB, UNIT, UROB, ULEU, UEPI, UWBC, URBC, UBAC, CAST, CRYS, UCOM, BILUA,  in the last 72 hours  Basic Metabolic Panel:  Recent Labs Lab 05/02/13 0530 05/03/13 0445 05/04/13 0603 05/05/13 0535 05/06/13 0518 05/07/13 0344 05/08/13 0613  NA 137 137 137 138 137 139 139  K 3.7 3.0* 3.5* 3.9 4.1 4.0 4.3  CL 103 101 102 105 106 107 104  CO2 22 24 24 22 21 21 19   GLUCOSE 228* 155* 191* 168* 184* 130* 187*  BUN 18 18 17 17 16 14 21   CREATININE 0.46* 0.45* 0.45* 0.45* 0.49* 0.49* 0.62  CALCIUM 8.2* 8.0* 8.0* 8.2* 8.1* 8.0* 8.7  MG 1.7 1.5 1.5 1.7 1.9  --   --   PHOS 2.4 2.5 3.1  --  2.5  --   --    GFR Estimated Creatinine Clearance: 82.7 ml/min (by C-G formula based on Cr of 0.62). Liver Function Tests:  Recent Labs Lab  05/04/13 0603 05/05/13 0535 05/06/13 0518 05/07/13 0344 05/08/13 0613  AST 14 15 14 18 13   ALT 31 32 32 35 29  ALKPHOS 128* 128* 120* 121* 128*  BILITOT 0.6 0.5 0.5 0.4 0.4  PROT 4.7* 4.6* 4.5* 4.6* 5.4*  ALBUMIN 2.0* 2.1* 2.1* 2.1* 2.5*   No results found for this basename: LIPASE, AMYLASE,  in the last 168 hours No results found for this basename: AMMONIA,  in the last 168 hours Coagulation profile No results found for this basename: INR, PROTIME,  in the last 168 hours  CBC:  Recent Labs Lab 05/04/13 0603 05/05/13 0535 05/06/13 0518 05/07/13 0344 05/08/13 0613  WBC 1.3* 2.7* 3.9* 4.3 4.8  NEUTROABS 1.0* 2.1 3.0 3.4 4.3  HGB 9.7* 9.2* 8.6* 8.2* 11.3*  HCT 26.8* 26.9* 24.8* 23.4* 32.2*  MCV 84.8 86.8 87.6 87.6 86.8  PLT 18* 24* 27* 40* 51*   Cardiac Enzymes: No results found for this basename: CKTOTAL, CKMB, CKMBINDEX, TROPONINI,  in the last 168 hours BNP: No components found with this basename: POCBNP,  CBG:  Recent Labs Lab 05/07/13 1216 05/07/13 1745 05/07/13 2153 05/08/13 0730 05/08/13  1257  GLUCAP 190* 253* 222* 164* 284*   D-Dimer No results found for this basename: DDIMER,  in the last 72 hours Hgb A1c No results found for this basename: HGBA1C,  in the last 72 hours Lipid Profile No results found for this basename: CHOL, HDL, LDLCALC, TRIG, CHOLHDL, LDLDIRECT,  in the last 72 hours Thyroid function studies No results found for this basename: TSH, T4TOTAL, FREET3, T3FREE, THYROIDAB,  in the last 72 hours Anemia work up No results found for this basename: VITAMINB12, FOLATE, FERRITIN, TIBC, IRON, RETICCTPCT,  in the last 72 hours Microbiology Recent Results (from the past 240 hour(s))  CLOSTRIDIUM DIFFICILE BY PCR     Status: None   Collection Time    05/05/13 12:20 PM      Result Value Ref Range Status   C difficile by pcr NEGATIVE  NEGATIVE Final   Comment: Performed at [facility]      Studies:  No results  found.  Assessment/Plan: 68 y.o.  With   1. 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 Treated with high dose methotrexate with last treatment on 04/21/03.   2. Pancytopenia: secondary to chemotherapy and probably underlying disease, white count improved today, hemoglobin at 11.3 after a transfusion  3. Nutrition: encouraged to eat now , off TNA  4. Deconditioning: being seen by PT, he is trying to work hard to get better so he can go home to be with his family  68. Elctrolytes: significant improvement in his potassium  6. Code status: full   Marcy Panning 05/08/2013

## 2013-05-09 DIAGNOSIS — D696 Thrombocytopenia, unspecified: Secondary | ICD-10-CM

## 2013-05-09 LAB — TYPE AND SCREEN
ABO/RH(D): O POS
Antibody Screen: NEGATIVE
Unit division: 0
Unit division: 0

## 2013-05-09 LAB — COMPREHENSIVE METABOLIC PANEL
ALK PHOS: 125 U/L — AB (ref 39–117)
ALT: 35 U/L (ref 0–53)
AST: 18 U/L (ref 0–37)
Albumin: 2.4 g/dL — ABNORMAL LOW (ref 3.5–5.2)
BILIRUBIN TOTAL: 0.3 mg/dL (ref 0.3–1.2)
BUN: 21 mg/dL (ref 6–23)
CHLORIDE: 105 meq/L (ref 96–112)
CO2: 19 meq/L (ref 19–32)
CREATININE: 0.65 mg/dL (ref 0.50–1.35)
Calcium: 8.6 mg/dL (ref 8.4–10.5)
GFR calc Af Amer: >90 mL/min (ref >90)
Glucose, Bld: 265 mg/dL — ABNORMAL HIGH (ref 70–99)
POTASSIUM: 4.1 meq/L (ref 3.7–5.3)
Sodium: 138 mEq/L (ref 137–147)
Total Protein: 4.9 g/dL — ABNORMAL LOW (ref 6.0–8.3)

## 2013-05-09 LAB — CBC WITH DIFFERENTIAL/PLATELET
Basophils Absolute: 0 10*3/uL (ref 0.0–0.1)
Basophils Relative: 0 % (ref 0–1)
Eosinophils Absolute: 0 10*3/uL (ref 0.0–0.7)
Eosinophils Relative: 0 % (ref 0–5)
HCT: 32.5 % — ABNORMAL LOW (ref 39.0–52.0)
HEMOGLOBIN: 11.6 g/dL — AB (ref 13.0–17.0)
LYMPHS PCT: 7 % — AB (ref 12–46)
Lymphs Abs: 0.3 10*3/uL — ABNORMAL LOW (ref 0.7–4.0)
MCH: 31.1 pg (ref 26.0–34.0)
MCHC: 35.7 g/dL (ref 30.0–36.0)
MCV: 87.1 fL (ref 78.0–100.0)
MONOS PCT: 4 % (ref 3–12)
Monocytes Absolute: 0.2 10*3/uL (ref 0.1–1.0)
NEUTROS ABS: 4.1 10*3/uL (ref 1.7–7.7)
NEUTROS PCT: 89 % — AB (ref 43–77)
Platelets: 72 10*3/uL — ABNORMAL LOW (ref 150–400)
RBC: 3.73 MIL/uL — ABNORMAL LOW (ref 4.22–5.81)
RDW: 15.6 % — ABNORMAL HIGH (ref 11.5–15.5)
WBC: 4.7 10*3/uL (ref 4.0–10.5)

## 2013-05-09 LAB — GLUCOSE, CAPILLARY
GLUCOSE-CAPILLARY: 251 mg/dL — AB (ref 70–99)
GLUCOSE-CAPILLARY: 290 mg/dL — AB (ref 70–99)
Glucose-Capillary: 188 mg/dL — ABNORMAL HIGH (ref 70–99)
Glucose-Capillary: 232 mg/dL — ABNORMAL HIGH (ref 70–99)

## 2013-05-09 NOTE — Progress Notes (Signed)
Keith Duffy   DOB:1945/04/26   TJ#:[id number]   BMZ#:[id number]   Subjective:patient is asleep at the time of my visit. I did not wake him up. Wife reports that he is eating better. He was also out of his room for a little bit.  Objective:  Filed Vitals:   05/09/13 0500  BP: 134/60  Pulse: 78  Temp: 97.5 F (36.4 C)  Resp: 16    Body mass index is 20.67 kg/(m^2).  Intake/Output Summary (Last 24 hours) at 05/09/13 1239 Last data filed at 05/09/13 0500  Gross per 24 hour  Intake      0 ml  Output    775 ml  Net   -775 ml   No exam performed due to patient sleeping  CBG (last 3)   Recent Labs  05/08/13 2134 05/09/13 0757 05/09/13 1157  GLUCAP 273* 188* 251*     Labs:  Lab Results  Component Value Date   WBC 4.7 05/09/2013   HGB 11.6* 05/09/2013   HCT 32.5* 05/09/2013   MCV 87.1 05/09/2013   PLT 72* 05/09/2013   NEUTROABS 4.1 05/09/2013    Urine Studies No results found for this basename: UACOL, UAPR, USPG, UPH, UTP, UGL, UKET, UBIL, UHGB, UNIT, UROB, ULEU, UEPI, UWBC, URBC, UBAC, CAST, CRYS, UCOM, BILUA,  in the last 72 hours  Basic Metabolic Panel:  Recent Labs Lab 05/03/13 0445 05/04/13 0603 05/05/13 0535 05/06/13 0518 05/07/13 0344 05/08/13 0613 05/09/13 0455  NA 137 137 138 137 139 139 138  K 3.0* 3.5* 3.9 4.1 4.0 4.3 4.1  CL 101 102 105 106 107 104 105  CO2 24 24 22 21 21 19 19   GLUCOSE 155* 191* 168* 184* 130* 187* 265*  BUN 18 17 17 16 14 21 21   CREATININE 0.45* 0.45* 0.45* 0.49* 0.49* 0.62 0.65  CALCIUM 8.0* 8.0* 8.2* 8.1* 8.0* 8.7 8.6  MG 1.5 1.5 1.7 1.9  --   --   --   PHOS 2.5 3.1  --  2.5  --   --   --    GFR Estimated Creatinine Clearance: 82.7 ml/min (by C-G formula based on Cr of 0.65). Liver Function Tests:  Recent Labs Lab 05/05/13 0535 05/06/13 0518 05/07/13 0344 05/08/13 0613 05/09/13 0455  AST 15 14 18 13 18   ALT 32 32 35 29 35  ALKPHOS 128* 120* 121* 128* 125*  BILITOT 0.5 0.5 0.4 0.4 0.3  PROT 4.6* 4.5* 4.6* 5.4* 4.9*   ALBUMIN 2.1* 2.1* 2.1* 2.5* 2.4*   No results found for this basename: LIPASE, AMYLASE,  in the last 168 hours No results found for this basename: AMMONIA,  in the last 168 hours Coagulation profile No results found for this basename: INR, PROTIME,  in the last 168 hours  CBC:  Recent Labs Lab 05/05/13 0535 05/06/13 0518 05/07/13 0344 05/08/13 0613 05/09/13 0455  WBC 2.7* 3.9* 4.3 4.8 4.7  NEUTROABS 2.1 3.0 3.4 4.3 4.1  HGB 9.2* 8.6* 8.2* 11.3* 11.6*  HCT 26.9* 24.8* 23.4* 32.2* 32.5*  MCV 86.8 87.6 87.6 86.8 87.1  PLT 24* 27* 40* 51* 72*   Cardiac Enzymes: No results found for this basename: CKTOTAL, CKMB, CKMBINDEX, TROPONINI,  in the last 168 hours BNP: No components found with this basename: POCBNP,  CBG:  Recent Labs Lab 05/08/13 1257 05/08/13 1716 05/08/13 2134 05/09/13 0757 05/09/13 1157  GLUCAP 284* 225* 273* 188* 251*   D-Dimer No results found for this basename: DDIMER,  in the last 72  hours Hgb A1c No results found for this basename: HGBA1C,  in the last 72 hours Lipid Profile No results found for this basename: CHOL, HDL, LDLCALC, TRIG, CHOLHDL, LDLDIRECT,  in the last 72 hours Thyroid function studies No results found for this basename: TSH, T4TOTAL, FREET3, T3FREE, THYROIDAB,  in the last 72 hours Anemia work up No results found for this basename: VITAMINB12, FOLATE, FERRITIN, TIBC, IRON, RETICCTPCT,  in the last 72 hours Microbiology Recent Results (from the past 240 hour(s))  CLOSTRIDIUM DIFFICILE BY PCR     Status: None   Collection Time    05/05/13 12:20 PM      Result Value Ref Range Status   C difficile by pcr NEGATIVE  NEGATIVE Final   Comment: Performed at [facility]      Studies:  No results found.  Assessment/Plan: 68 y.o.  With   1. 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 Treated with high dose methotrexate.  2. Pancytopenia:he remains anemic and  thrombocytopenic  3. Nutrition: continue to take increasing po intake, he is motivated to get home  4. Deconditioning: being seen by PT, he did not have PT on 3/21  5. Elctrolytes: significant improvement in his potassium  6. Code status: full   Marcy Panning 05/09/2013

## 2013-05-10 ENCOUNTER — Other Ambulatory Visit: Payer: Self-pay | Admitting: *Deleted

## 2013-05-10 DIAGNOSIS — IMO0002 Reserved for concepts with insufficient information to code with codable children: Secondary | ICD-10-CM

## 2013-05-10 DIAGNOSIS — E1165 Type 2 diabetes mellitus with hyperglycemia: Secondary | ICD-10-CM

## 2013-05-10 DIAGNOSIS — R531 Weakness: Secondary | ICD-10-CM

## 2013-05-10 DIAGNOSIS — C8590 Non-Hodgkin lymphoma, unspecified, unspecified site: Secondary | ICD-10-CM

## 2013-05-10 LAB — COMPREHENSIVE METABOLIC PANEL
ALT: 37 U/L (ref 0–53)
AST: 18 U/L (ref 0–37)
Albumin: 2.4 g/dL — ABNORMAL LOW (ref 3.5–5.2)
Alkaline Phosphatase: 110 U/L (ref 39–117)
BUN: 18 mg/dL (ref 6–23)
CO2: 22 meq/L (ref 19–32)
CREATININE: 0.62 mg/dL (ref 0.50–1.35)
Calcium: 8.8 mg/dL (ref 8.4–10.5)
Chloride: 104 mEq/L (ref 96–112)
Glucose, Bld: 151 mg/dL — ABNORMAL HIGH (ref 70–99)
Potassium: 4.4 mEq/L (ref 3.7–5.3)
Sodium: 139 mEq/L (ref 137–147)
Total Bilirubin: 0.4 mg/dL (ref 0.3–1.2)
Total Protein: 5 g/dL — ABNORMAL LOW (ref 6.0–8.3)

## 2013-05-10 LAB — CBC WITH DIFFERENTIAL/PLATELET
BASOS ABS: 0 10*3/uL (ref 0.0–0.1)
Basophils Relative: 0 % (ref 0–1)
EOS PCT: 0 % (ref 0–5)
Eosinophils Absolute: 0 10*3/uL (ref 0.0–0.7)
HEMATOCRIT: 32.4 % — AB (ref 39.0–52.0)
Hemoglobin: 10.9 g/dL — ABNORMAL LOW (ref 13.0–17.0)
LYMPHS PCT: 5 % — AB (ref 12–46)
Lymphs Abs: 0.3 10*3/uL — ABNORMAL LOW (ref 0.7–4.0)
MCH: 29.8 pg (ref 26.0–34.0)
MCHC: 33.6 g/dL (ref 30.0–36.0)
MCV: 88.5 fL (ref 78.0–100.0)
MONO ABS: 0.3 10*3/uL (ref 0.1–1.0)
MONOS PCT: 5 % (ref 3–12)
NEUTROS ABS: 4.3 10*3/uL (ref 1.7–7.7)
Neutrophils Relative %: 89 % — ABNORMAL HIGH (ref 43–77)
Platelets: 75 10*3/uL — ABNORMAL LOW (ref 150–400)
RBC: 3.66 MIL/uL — ABNORMAL LOW (ref 4.22–5.81)
RDW: 15.7 % — AB (ref 11.5–15.5)
WBC: 4.8 10*3/uL (ref 4.0–10.5)

## 2013-05-10 LAB — GLUCOSE, CAPILLARY: GLUCOSE-CAPILLARY: 154 mg/dL — AB (ref 70–99)

## 2013-05-10 MED ORDER — MIRTAZAPINE 15 MG PO TABS: 15.0000 mg | ORAL_TABLET | Freq: Every morning | ORAL | Status: DC

## 2013-05-10 MED ORDER — HEPARIN SOD (PORK) LOCK FLUSH 100 UNIT/ML IV SOLN
INTRAVENOUS | Status: AC
  Administered 2013-05-10: 500 [IU]
  Filled 2013-05-10: qty 5

## 2013-05-10 MED ORDER — DEXAMETHASONE 4 MG PO TABS
4.0000 mg | ORAL_TABLET | Freq: Three times a day (TID) | ORAL | Status: DC
  Filled 2013-05-10 (×4): qty 1

## 2013-05-10 MED ORDER — SACCHAROMYCES BOULARDII 250 MG PO CAPS: 250.0000 mg | ORAL_CAPSULE | Freq: Two times a day (BID) | ORAL | Status: AC

## 2013-05-10 MED ORDER — SACCHAROMYCES BOULARDII 250 MG PO CAPS: 250.0000 mg | ORAL_CAPSULE | Freq: Two times a day (BID) | ORAL | Status: DC

## 2013-05-10 MED ORDER — PANTOPRAZOLE SODIUM 40 MG PO TBEC: 40.0000 mg | DELAYED_RELEASE_TABLET | Freq: Every day | ORAL | Status: AC

## 2013-05-10 MED ORDER — PANTOPRAZOLE SODIUM 40 MG PO TBEC: 40.0000 mg | DELAYED_RELEASE_TABLET | Freq: Every day | ORAL | Status: DC

## 2013-05-10 MED ORDER — DEXAMETHASONE 4 MG PO TABS: 4.0000 mg | ORAL_TABLET | Freq: Three times a day (TID) | ORAL | Status: DC

## 2013-05-10 NOTE — Progress Notes (Signed)
[facility] is providing the following services: [facility]  If patient discharges after hours, please call [phone].   Linward Headland 05/10/2013, 10:06 AM

## 2013-05-10 NOTE — Care Management Note (Signed)
Cm spoke with patient and spouse at bedside. Per pt choice [facility] to provide HHPt upon discharge. Cm contacted Arbie Cookey at [facility] at [phone]. Pt demographics, Md orders, and dc summary faxed to agency at [phone]. Pt request [facility] as Physical therapist. Pt's spouse request hospital bed, rollator, and 3n1. [facility] rep [place] notified of dme referral. No other needs identified.     Venita Lick [name],MSN,RN [phone]

## 2013-05-10 NOTE — Discharge Summary (Signed)
NAME:  Keith Duffy, Keith Duffy NO.:  [id number]  MEDICAL RECORD NO.:  [id number]  LOCATION:  41                         FACILITY:  [facility]  PHYSICIAN:  Volanda Napoleon, M.D.  DATE OF BIRTH:  05/12/1945  DATE OF ADMISSION:  04/25/2013 DATE OF DISCHARGE:  05/10/2013                              DISCHARGE SUMMARY   DIAGNOSES ON DISCHARGE: 1. Profound pancytopenia secondary to chemotherapy. 2  Pancolitis due to chemotherapy 2. Relapsed non-Hodgkin's lymphoma-CNS relapse. 3. Cachexia secondary to side effects of chemotherapy. 4. Severe protein-calorie malnutrition, secondary to chemotherapy. 5. Initiation of TNA for support. 6. Bladder incontinence secondary to profound weakness. 7. Hypokalemia. 8. Multiple blood transfusions. 9. Placement of PICC line for IV support.  CONDITION ON DISCHARGE:  Fair.  ACTIVITIES:  The patient will require physical therapy at home.  His follow up, the patient will see Dr. Marin Olp at the [facility] on May 18, 2013.  MEDICATIONS:  Upon discharge are Remeron 15 mg p.o. daily, Protonix 40 mg p.o. daily, Florastor 1 capsule p.o. b.i.d., Decadron 4 mg p.o. t.i.d., insulin as scheduled, ketoconazole 200 mg p.o. daily, Ativan 0.5 mg p.o. q.6 hours p.r.n., metoprolol 25 mg p.o. b.i.d., oxycodone 10-20 mg p.o. q.4 hours p.r.n. pain, Compazine 10 mg p.o. q.6 hours p.r.n. nausea, Flomax 0.4 mg p.o. daily as needed.  HOSPITAL COURSE:  Mr. Rinks was admitted by the hospitalist.  I was grateful for their help.  He came in because of profound pancytopenia. He was admitted on the 5th.  We subsequently helped with his management.  When he came in, his white cell count was 0.1.  His red cell count was 7.1, and platelet count 43,000.  His renal function still looked okay.  He started on broad-spectrum antibiotic coverage.  He had x-rays done.  X-rays were all negative for any obvious infection.  Chest x-ray showed no pneumonia.   He did have what appeared to be a colitis.  This was found on CT scan.  Diffuse wall thickening of the colon.  Blood cultures were all negative.  C. diff was negative.  Again, we started on antibiotics for this.  This seemed to improve slowly,  but surely.  Unfortunately, his white cell count began to drop quickly.  He became quite neutropenic.  He also became quite pancytopenic.  I had to start him on Neupogen to try to get his white cell count up.  He was on 780 mcg of Neupogen to try to get his white cell count up.  His white cell count took longer than I had anticipated to rise, and finally began to rise on May 02, 2013.  He was supported with platelets and red blood cells during his period of pancytopenia.  He was started on broad-spectrum antibiotic coverage. Again, all cultures were negative.  We had to put a Foley catheter in him because of profound weakness.  He has some bladder retention.  The Foley catheter helped.  He was not eating at all.  We had to start him on TNA.  This helped to support him nutritionally until he could eat again.  A PICC line was placed for IV access.  We gave  him potassium quite often.  This was because of diarrhea.  Once the diarrhea finally resolved, his potassium improved.  We did give him some Florastor to help with his intestinal bacteria content.  He required physical therapy while in the hospital.  He was incredibly weak.  He was incredibly deconditioned.  Physical therapy helped out as much as they could.  Finally, when his blood counts improved, he began to get stronger.  He just was not eating.  I did go ahead and do another sampling of his spinal fluid.  This was taken from my reservoir.  This was done on May 05, 2013.  The cytology was negative for lymphoma.  I did get him on some Decadron.  I gave him some Decadron on night of May 07, 2013.  This finally, in my mind, got him improved.  He began to eat better.  We  transfused him 2 units of blood on May 07, 2013. This also helped.  I gave him some Megace elixir.  I also put him on some Remeron.  I do think, however, that it was the Decadron that helped more than anything.  Over the weekend of the 20th, he began to eat more. His nutritional state improved.  His albumin rose.  We finally were able to get him to a point where we could get him home.  On the 23rd, his white cell count was 4.8, hemoglobin 10.9, hematocrit 32.4, platelet count 75,000.  His sodium is 139, potassium 4.4, BUN 18, creatinine 0.62.  Albumin was 2.4.  His blood sugars were on the high side because of the Decadron. However, these are being controlled with his insulin.  DISCHARGE PHYSICAL EXAMINATION:  VITAL SIGNS:  Upon discharge, his vital signs showed temperature of 97.5, pulse 70, respiratory rate 16, blood pressure 134/70. HEENT:  His head and neck exam showed no oral mucositis.  Oral mucosa was moist.  He still has some slight right cranial nerve VII deficit. He had no oral thrush.  Pupils reacted appropriately.  He had no adenopathy in his neck. LUNGS:  Clear. CARDIAC:  Regular rate and rhythm with normal S1, S2.  There were no murmurs, rubs, or bruits. ABDOMEN:  Soft.  Good bowel sounds.  There is no palpable abdominal mass.  There is no fluid wave.  There is no palpable hepatosplenomegaly. EXTREMITIES:  Show no clubbing, cyanosis, or edema.  Muscle atrophy in upper and lower extremities.  He had 3/5 strength in his upper and lower extremities. SKIN:  Shows some scattered ecchymoses and petechia from his thrombocytopenia. NEUROLOGICAL:  Shows the right cranial nerve VII deficit.  He had left footdrop.  His wife, who was an absolute Davonna Belling, during the hospital stay, I felt that she could handle him at home.     Volanda Napoleon, M.D.     PRE/MEDQ  D:  05/10/2013  T:  05/10/2013  Job:  [id number]

## 2013-05-10 NOTE — Discharge Summary (Signed)
#   [id number] is d/c summary.  Keith Duffy  PS  the staff on [address] did an incredible job with him. We need to be recognized for all the hard work to be put in to get him better. I am truly, truly, truly appreciative of the great and compassionate care.  Romans 8:28

## 2013-05-11 ENCOUNTER — Encounter: Payer: Self-pay | Admitting: *Deleted

## 2013-05-11 ENCOUNTER — Encounter (HOSPITAL_COMMUNITY): Payer: Self-pay | Admitting: Emergency Medicine

## 2013-05-11 ENCOUNTER — Other Ambulatory Visit: Payer: Self-pay | Admitting: Hematology & Oncology

## 2013-05-11 ENCOUNTER — Emergency Department (HOSPITAL_COMMUNITY)
Admission: EM | Admit: 2013-05-11 | Discharge: 2013-05-11 | Disposition: A | Discharge status: Home / Self Care | Payer: Medicare Other | Attending: Emergency Medicine | Admitting: Emergency Medicine

## 2013-05-11 DIAGNOSIS — Z87898 Personal history of other specified conditions: Secondary | ICD-10-CM | POA: Insufficient documentation

## 2013-05-11 DIAGNOSIS — R739 Hyperglycemia, unspecified: Secondary | ICD-10-CM

## 2013-05-11 DIAGNOSIS — C8590 Non-Hodgkin lymphoma, unspecified, unspecified site: Secondary | ICD-10-CM

## 2013-05-11 DIAGNOSIS — Z79899 Other long term (current) drug therapy: Secondary | ICD-10-CM | POA: Insufficient documentation

## 2013-05-11 DIAGNOSIS — E119 Type 2 diabetes mellitus without complications: Secondary | ICD-10-CM | POA: Insufficient documentation

## 2013-05-11 DIAGNOSIS — R339 Retention of urine, unspecified: Secondary | ICD-10-CM

## 2013-05-11 DIAGNOSIS — Z794 Long term (current) use of insulin: Secondary | ICD-10-CM | POA: Insufficient documentation

## 2013-05-11 DIAGNOSIS — I10 Essential (primary) hypertension: Secondary | ICD-10-CM | POA: Insufficient documentation

## 2013-05-11 LAB — URINE MICROSCOPIC-ADD ON

## 2013-05-11 LAB — BASIC METABOLIC PANEL
BUN: 20 mg/dL (ref 6–23)
BUN: 19 mg/dL (ref 6–23)
CHLORIDE: 94 meq/L — AB (ref 96–112)
CHLORIDE: 95 meq/L — AB (ref 96–112)
CO2: 18 mEq/L — ABNORMAL LOW (ref 19–32)
CO2: 24 mEq/L (ref 19–32)
CREATININE: 0.59 mg/dL (ref 0.50–1.35)
Calcium: 9.2 mg/dL (ref 8.4–10.5)
Calcium: 9 mg/dL (ref 8.4–10.5)
Creatinine, Ser: 0.57 mg/dL (ref 0.50–1.35)
Glucose, Bld: 222 mg/dL — ABNORMAL HIGH (ref 70–99)
Glucose, Bld: 176 mg/dL — ABNORMAL HIGH (ref 70–99)
POTASSIUM: 4.1 meq/L (ref 3.7–5.3)
Potassium: 3.9 mEq/L (ref 3.7–5.3)
SODIUM: 135 meq/L — AB (ref 137–147)
Sodium: 134 mEq/L — ABNORMAL LOW (ref 137–147)

## 2013-05-11 LAB — BLOOD GAS, VENOUS
Acid-Base Excess: 0.3 mmol/L (ref 0.0–2.0)
BICARBONATE: 23.5 meq/L (ref 20.0–24.0)
DRAWN BY: 295031
FIO2: 0.21 %
O2 SAT: 59.9 %
PATIENT TEMPERATURE: 98.6
PH VEN: 7.44 — AB (ref 7.250–7.300)
TCO2: 20.7 mmol/L (ref 0–100)
pCO2, Ven: 35.2 mmHg — ABNORMAL LOW (ref 45.0–50.0)
pO2, Ven: 32.4 mmHg (ref 30.0–45.0)

## 2013-05-11 LAB — URINALYSIS, ROUTINE W REFLEX MICROSCOPIC
Bilirubin Urine: NEGATIVE
Glucose, UA: 250 mg/dL — AB
Hgb urine dipstick: NEGATIVE
KETONES UR: NEGATIVE mg/dL
LEUKOCYTES UA: NEGATIVE
NITRITE: NEGATIVE
Protein, ur: 30 mg/dL — AB
Specific Gravity, Urine: 1.012 (ref 1.005–1.030)
UROBILINOGEN UA: 0.2 mg/dL (ref 0.0–1.0)
pH: 7 (ref 5.0–8.0)

## 2013-05-11 LAB — CBC WITH DIFFERENTIAL/PLATELET
BASOS PCT: 0 % (ref 0–1)
Basophils Absolute: 0 10*3/uL (ref 0.0–0.1)
EOS ABS: 0 10*3/uL (ref 0.0–0.7)
Eosinophils Relative: 0 % (ref 0–5)
HCT: 40.4 % (ref 39.0–52.0)
Hemoglobin: 14.3 g/dL (ref 13.0–17.0)
Lymphocytes Relative: 6 % — ABNORMAL LOW (ref 12–46)
Lymphs Abs: 0.4 10*3/uL — ABNORMAL LOW (ref 0.7–4.0)
MCH: 30.7 pg (ref 26.0–34.0)
MCHC: 35.4 g/dL (ref 30.0–36.0)
MCV: 86.7 fL (ref 78.0–100.0)
Monocytes Absolute: 0.4 10*3/uL (ref 0.1–1.0)
Monocytes Relative: 5 % (ref 3–12)
NEUTROS ABS: 6.2 10*3/uL (ref 1.7–7.7)
NEUTROS PCT: 89 % — AB (ref 43–77)
Platelets: 81 10*3/uL — ABNORMAL LOW (ref 150–400)
RBC: 4.66 MIL/uL (ref 4.22–5.81)
RDW: 15.8 % — ABNORMAL HIGH (ref 11.5–15.5)
WBC: 7 10*3/uL (ref 4.0–10.5)

## 2013-05-11 LAB — KETONES, QUALITATIVE: ACETONE BLD: NEGATIVE

## 2013-05-11 MED ORDER — SODIUM CHLORIDE 0.9 % IV BOLUS (SEPSIS)
1000.0000 mL | Freq: Once | INTRAVENOUS | Status: AC
  Administered 2013-05-11: 1000 mL via INTRAVENOUS

## 2013-05-11 MED ORDER — HEPARIN SOD (PORK) LOCK FLUSH 100 UNIT/ML IV SOLN
500.0000 [IU] | Freq: Once | INTRAVENOUS | Status: AC
  Administered 2013-05-11: 500 [IU]
  Filled 2013-05-11: qty 5

## 2013-05-11 NOTE — ED Notes (Signed)
Bladder scan showed 647ml urine in bladder

## 2013-05-11 NOTE — ED Provider Notes (Signed)
TIME SEEN: 7:50 AM  CHIEF COMPLAINT: Urinary retention  HPI: Patient is a 68 year old male with a history of hypertension, insulin-dependent diabetes, non-Hodgkin's lymphoma currently on chemotherapy who was recently admitted to the hospital for pancytopenia and pancolitis secondary to his chemotherapy presents to the emergency department with complaints of urinary retention. Patient was able to urinate normally last night but states this morning he has only had small amount of urine. He is having discomfort in his suprapubic area. Bladder scan showed greater than 600 mL. While patient was hospitalized he had urinary retention and had a Foley catheter placement 3/10-3/16. He denies any new back pain, numbness, tingling or focal weakness. No bowel incontinence. Patient reports he was on multiple medications while in the hospital but his only new medication currently is Decadron. Patient believes he has a history of BPH. No dysuria, hematuria, penile discharge, testicular pain or swelling.  ROS: See HPI Constitutional: no fever  Eyes: no drainage  ENT: no runny nose   Cardiovascular:  no chest pain  Resp: no SOB  GI: no vomiting GU: no dysuria Integumentary: no rash  Allergy: no hives  Musculoskeletal: no leg swelling  Neurological: no slurred speech ROS otherwise negative  PAST MEDICAL HISTORY/PAST SURGICAL HISTORY:  Past Medical History  Diagnosis Date  . Back pain   . Hypertension   . Diabetes mellitus without complication   . Cancer     non hodkins lymphoma    MEDICATIONS:  Prior to Admission medications   Medication Sig Start Date End Date Taking? Authorizing Provider  artificial tears (LACRILUBE) OINT ophthalmic ointment Place into the right eye every 8 (eight) hours. 03/20/13   Volanda Napoleon, MD  dexamethasone (DECADRON) 4 MG tablet Take 1 tablet (4 mg total) by mouth 3 (three) times daily. 05/10/13   Volanda Napoleon, MD  insulin aspart (NOVOLOG) 100 UNIT/ML FlexPen Inject  into the skin 3 (three) times daily with meals. CBG < 70: eat or drink something sweet & recheck blood sugar.    HOLD CBG 70 - 120: 0 units  CBG 121 - 150: 1 unit  CBG 151 - 200: 2 units  CBG 201 - 250: 3 units  CBG 251 - 300: 5 units  CBG 301 - 350: 7 units  CBG 351 - 400: 9 units  CBG > 400: call MD 08/28/12   Modena Jansky, MD  Insulin Glargine (LANTUS SOLOSTAR) 100 UNIT/ML Solostar Pen Inject 25 Units into the skin at bedtime. HOLD    Historical Provider, MD  ketoconazole (NIZORAL) 200 MG tablet Take 1 tablet (200 mg total) by mouth daily. 04/20/13   Volanda Napoleon, MD  LORazepam (ATIVAN) 0.5 MG tablet Take 1 tablet (0.5 mg total) by mouth every 6 (six) hours as needed (nausea and vomiting). 03/20/13   Volanda Napoleon, MD  metoprolol tartrate (LOPRESSOR) 25 MG tablet Take 25 mg by mouth 2 (two) times daily. 1 tablet every morning and 1 tablet at lunch    Historical Provider, MD  mirtazapine (REMERON) 15 MG tablet Take 1 tablet (15 mg total) by mouth every morning. 05/10/13   Volanda Napoleon, MD  Oxycodone HCl 10 MG TABS Take 10-20 mg by mouth every 4 (four) hours as needed (breakthrough pain).    Historical Provider, MD  pantoprazole (PROTONIX) 40 MG tablet Take 1 tablet (40 mg total) by mouth daily. 05/10/13   Volanda Napoleon, MD  prochlorperazine (COMPAZINE) 10 MG tablet Take 1 tablet (10 mg total) by mouth  every 6 (six) hours as needed (Nausea or vomiting). 03/13/13   Volanda Napoleon, MD  saccharomyces boulardii (FLORASTOR) 250 MG capsule Take 1 capsule (250 mg total) by mouth 2 (two) times daily. 05/10/13   Volanda Napoleon, MD  tamsulosin (FLOMAX) 0.4 MG CAPS capsule Take 0.4 mg by mouth daily as needed (for urinary flow).  03/20/13   Volanda Napoleon, MD    ALLERGIES:  Allergies  Allergen Reactions  . Aspartame And Phenylalanine Diarrhea  . Codeine Other (See Comments)    "Made me go wild when I was young"    SOCIAL HISTORY:  History  Substance Use Topics  . Smoking status: Never  Smoker   . Smokeless tobacco: Never Used     Comment: never smoked  . Alcohol Use: No    FAMILY HISTORY: Family History  Problem Relation Age of Onset  . Heart attack Father   . Heart attack Maternal Grandfather     EXAM: BP 132/101  Pulse 104  Temp(Src) 97.4 F (36.3 C) (Oral)  Resp 20  SpO2 100% CONSTITUTIONAL: Alert and oriented and responds appropriately to questions. Well-appearing; well-nourished HEAD: Normocephalic EYES: Conjunctivae clear, PERRL ENT: normal nose; no rhinorrhea; moist mucous membranes; pharynx without lesions noted NECK: Supple, no meningismus, no LAD  CARD: RRR; S1 and S2 appreciated; no murmurs, no clicks, no rubs, no gallops RESP: Normal chest excursion without splinting or tachypnea; breath sounds clear and equal bilaterally; no wheezes, no rhonchi, no rales,  ABD/GI: Normal bowel sounds;  soft, tender to palpation in the suprapubic area with mild distention, no rebound or guarding, no peritoneal signs BACK:  The back appears normal and is non-tender to palpation, there is no CVA tenderness EXT: Normal ROM in all joints; non-tender to palpation; no edema; normal capillary refill; no cyanosis    SKIN: Normal color for age and race; warm NEURO: Moves all extremities equally; sensation to light touch intact diffusely, 2+ deep tendon reflexes in bilateral upper and lower extremities PSYCH: The patient's mood and manner are appropriate. Grooming and personal hygiene are appropriate.  MEDICAL DECISION MAKING: Patient here with urinary retention. He was recently hospitalized and had urinary retention and had a catheter placed for 6 days but this is been removed. Urinary retention may be do to BPH versus recent catheter placement versus UTI. Given patient is on chemotherapy and was recently pancytopenia, will obtain basic labs. We'll also send urinalysis and urine culture. We'll place Foley catheter. If workup is unremarkable, patient and wife like to be  discharged home.  ED PROGRESS: Pt's pancytopenia is improving. His urine shows no sign of infection. His labs show anion gap of 22 with mild hyperglycemia. Patient's wife reports his blood sugars have not been greater than 250. He is currently on Decadron. They deny that he has had any vomiting or diarrhea. We'll give IV fluids, obtain VBG.  Will repeat BMP - ? Lab error given patient had normal electrolytes upon discharge yesterday and has had no  causes of AGMA other than mild hyperglycemia.   11:55 AM  Pt's repeat labs show a bicarbonate of 24 which is improved. This may have been a lab error. His anion gap now is 10-16 and he is receiving IV fluids. He reports he is feeling much better and would prefer discharge home. He has PCP and oncology followup. His wife has called Dr. Marin Olp while in the emergency department. Given return precautions. We'll give urology followup information. Patient and wife verbalize understanding and  are comfortable plan.  [place], DO 05/11/13 1155

## 2013-05-11 NOTE — ED Notes (Signed)
Pt discharged from the hospital yesterday. Removed catheter in hospital, and was able to urinate before discharge. Pt states he began to urinary retention 3 hours ago.

## 2013-05-11 NOTE — Discharge Instructions (Signed)
Hyperglycemia °Hyperglycemia occurs when the glucose (sugar) in your blood is too high. Hyperglycemia can happen for many reasons, but it most often happens to people who do not know they have diabetes or are not managing their diabetes properly.  °CAUSES  °Whether you have diabetes or not, there are other causes of hyperglycemia. Hyperglycemia can occur when you have diabetes, but it can also occur in other situations that you might not be as aware of, such as: °Diabetes °· If you have diabetes and are having problems controlling your blood glucose, hyperglycemia could occur because of some of the following reasons: °· Not following your meal plan. °· Not taking your diabetes medications or not taking it properly. °· Exercising less or doing less activity than you normally do. °· Being sick. °Pre-diabetes °· This cannot be ignored. Before people develop Type 2 diabetes, they almost always have "pre-diabetes." This is when your blood glucose levels are higher than normal, but not yet high enough to be diagnosed as diabetes. Research has shown that some long-term damage to the body, especially the heart and circulatory system, may already be occurring during pre-diabetes. If you take action to manage your blood glucose when you have pre-diabetes, you may delay or prevent Type 2 diabetes from developing. °Stress °· If you have diabetes, you may be "diet" controlled or on oral medications or insulin to control your diabetes. However, you may find that your blood glucose is higher than usual in the hospital whether you have diabetes or not. This is often referred to as "stress hyperglycemia." Stress can elevate your blood glucose. This happens because of hormones put out by the body during times of stress. If stress has been the cause of your high blood glucose, it can be followed regularly by your caregiver. That way he/she can make sure your hyperglycemia does not continue to get worse or progress to  diabetes. °Steroids °· Steroids are medications that act on the infection fighting system (immune system) to block inflammation or infection. One side effect can be a rise in blood glucose. Most people can produce enough extra insulin to allow for this rise, but for those who cannot, steroids make blood glucose levels go even higher. It is not unusual for steroid treatments to "uncover" diabetes that is developing. It is not always possible to determine if the hyperglycemia will go away after the steroids are stopped. A special blood test called an A1c is sometimes done to determine if your blood glucose was elevated before the steroids were started. °SYMPTOMS °· Thirsty. °· Frequent urination. °· Dry mouth. °· Blurred vision. °· Tired or fatigue. °· Weakness. °· Sleepy. °· Tingling in feet or leg. °DIAGNOSIS  °Diagnosis is made by monitoring blood glucose in one or all of the following ways: °· A1c test. This is a chemical found in your blood. °· Fingerstick blood glucose monitoring. °· Laboratory results. °TREATMENT  °First, knowing the cause of the hyperglycemia is important before the hyperglycemia can be treated. Treatment may include, but is not be limited to: °· Education. °· Change or adjustment in medications. °· Change or adjustment in meal plan. °· Treatment for an illness, infection, etc. °· More frequent blood glucose monitoring. °· Change in exercise plan. °· Decreasing or stopping steroids. °· Lifestyle changes. °HOME CARE INSTRUCTIONS  °· Test your blood glucose as directed. °· Exercise regularly. Your caregiver will give you instructions about exercise. Pre-diabetes or diabetes which comes on with stress is helped by exercising. °· Eat wholesome,   balanced meals. Eat often and at regular, fixed times. Your caregiver or nutritionist will give you a meal plan to guide your sugar intake.  Being at an ideal weight is important. If needed, losing as little as 10 to 15 pounds may help improve blood  glucose levels. SEEK MEDICAL CARE IF:   You have questions about medicine, activity, or diet.  You continue to have symptoms (problems such as increased thirst, urination, or weight gain). SEEK IMMEDIATE MEDICAL CARE IF:   You are vomiting or have diarrhea.  Your breath smells fruity.  You are breathing faster or slower.  You are very sleepy or incoherent.  You have numbness, tingling, or pain in your feet or hands.  You have chest pain.  Your symptoms get worse even though you have been following your caregiver's orders.  If you have any other questions or concerns. Document Released: 07/31/2000 Document Revised: 04/29/2011 Document Reviewed: 06/03/2011 [facility] Patient Information 2014 [place], [place].  Acute Urinary Retention, Male Acute urinary retention is the temporary inability to urinate. This is a common problem in older men. As men age their prostates become larger and block the flow of urine from the bladder. This is usually a problem that has come on gradually.  HOME CARE INSTRUCTIONS If you are sent home with a Foley catheter and a drainage system, you will need to discuss the best course of action with your health care provider. While the catheter is in, maintain a good intake of fluids. Keep the drainage bag emptied and lower than your catheter. This is so that contaminated urine will not flow back into your bladder, which could lead to a urinary tract infection. There are two main types of drainage bags. One is a large bag that usually is used at night. It has a good capacity that will allow you to sleep through the night without having to empty it. The second type is called a leg bag. It has a smaller capacity, so it needs to be emptied more frequently. However, the main advantage is that it can be attached by a leg strap and can go underneath your clothing, allowing you the freedom to move about or leave your home. Only take over-the-counter or prescription medicines  for pain, discomfort, or fever as directed by your health care provider.  SEEK MEDICAL CARE IF:  You develop a low-grade fever.  You experience spasms or leakage of urine with the spasms. SEEK IMMEDIATE MEDICAL CARE IF:   You develop chills or fever.  Your catheter stops draining urine.  Your catheter falls out.  You start to develop increased bleeding that does not respond to rest and increased fluid intake. MAKE SURE YOU:  Understand these instructions.  Will watch your condition.  Will get help right away if you are not doing well or get worse. Document Released: 05/13/2000 Document Revised: 10/07/2012 Document Reviewed: 07/16/2012 [facility] Patient Information 2014 [place], [place]. Foley Catheter Care, Adult A Foley catheter is a soft, flexible tube that is placed into the bladder to drain urine. A Foley catheter may be inserted if: You leak urine or are not able to control when you urinate (urinary incontinence). You are not able to urinate when you need to (urinary retention). You had prostate surgery or surgery on the genitals. You have certain medical conditions, such as multiple sclerosis, dementia, or a spinal cord injury. If you are going home with a Foley catheter in place, follow the instructions below. TAKING CARE OF THE CATHETER Wash your  hands with soap and water. Using mild soap and warm water on a clean washcloth: Clean the area on your body closest to the catheter insertion site using a circular motion, moving away from the catheter. Never wipe toward the catheter because this could sweep bacteria up into the urethra and cause infection. Remove all traces of soap. Pat the area dry with a clean towel. For males, reposition the foreskin. Attach the catheter to your leg so there is no tension on the catheter. Use adhesive tape or a leg strap. If you are using adhesive tape, remove any sticky residue left behind by the previous tape you used. Keep the drainage bag  below the level of the bladder, but keep it off the floor. Check throughout the day to be sure the catheter is working and urine is draining freely. Make sure the tubing does not become kinked. Do not pull on the catheter or try to remove it. Pulling could damage internal tissues. TAKING CARE OF THE DRAINAGE BAGS You will be given two drainage bags to take home. One is a large overnight drainage bag, and the other is a smaller leg bag that fits underneath clothing. You may wear the overnight bag at any time, but you should never wear the smaller leg bag at night. Follow the instructions below for how to empty, change, and clean your drainage bags. Emptying the Drainage Bag You must empty your drainage bag when it is    full or at least 2 3 times a day. Wash your hands with soap and water. Keep the drainage bag below your hips, below the level of your bladder. This stops urine from going back into the tubing and into your bladder. Hold the dirty bag over the toilet or a clean container. Open the pour spout at the bottom of the bag and empty the urine into the toilet or container. Do not let the pour spout touch the toilet, container, or any other surface. Doing so can place bacteria on the bag, which can cause an infection. Clean the pour spout with a gauze pad or cotton ball that has rubbing alcohol on it. Close the pour spout. Attach the bag to your leg with adhesive tape or a leg strap. Wash your hands well. Changing the Drainage Bag Change your drainage bag once a month or sooner if it starts to smell bad or look dirty. Below are steps to follow when changing the drainage bag. Wash your hands with soap and water. Pinch off the rubber catheter so that urine does not spill out. Disconnect the catheter tube from the drainage tube at the connection valve. Do not let the tubes touch any surface. Clean the end of the catheter tube with an alcohol wipe. Use a different alcohol wipe to clean the end  of the drainage tube. Connect the catheter tube to the drainage tube of the clean drainage bag. Attach the new bag to the leg with adhesive tape or a leg strap. Avoid attaching the new bag too tightly. Wash your hands well. Cleaning the Drainage Bag Wash your hands with soap and water. Wash the bag in warm, soapy water. Rinse the bag thoroughly with warm water. Fill the bag with a solution of white vinegar and water (1 cup vinegar to 1 qt warm water [.2 L vinegar to 1 L warm water]). Close the bag and soak it for 30 minutes in the solution. Rinse the bag with warm water. Hang the bag to dry with the  pour spout open and hanging downward. Store the clean bag (once it is dry) in a clean plastic bag. Wash your hands well. PREVENTING INFECTION Wash your hands before and after handling your catheter. Take showers daily and wash the area where the catheter enters your body. Do not take baths. Replace wet leg straps with dry ones, if this applies. Do not use powders, sprays, or lotions on the genital area. Only use creams, lotions, or ointments as directed by your caregiver. For females, wipe from front to back after each bowel movement. Drink enough fluids to keep your urine clear or pale yellow unless you have a fluid restriction. Do not let the drainage bag or tubing touch or lie on the floor. Wear cotton underwear to absorb moisture and to keep your skin drier. SEEK MEDICAL CARE IF:  Your urine is cloudy or smells unusually bad. Your catheter becomes clogged. You are not draining urine into the bag or your bladder feels full. Your catheter starts to leak. SEEK IMMEDIATE MEDICAL CARE IF:  You have pain, swelling, redness, or pus where the catheter enters the body. You have pain in the abdomen, legs, lower back, or bladder. You have a fever. You see blood fill the catheter, or your urine is pink or red. You have nausea, vomiting, or chills. Your catheter gets pulled out. MAKE SURE YOU:   Understand these instructions. Will watch your condition. Will get help right away if you are not doing well or get worse. Document Released: 02/04/2005 Document Revised: 06/01/2012 Document Reviewed: 01/27/2012 [facility] Patient Information 2014 [place].

## 2013-05-11 NOTE — Progress Notes (Addendum)
   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 Relapsed non-Hodgkin's lymphoma-CNS relapse. TNA use     Subjective/Objective Assessment Detail:   Returned to [facility] ED with Urinary retention after a foley removed prior to d/c CM received a call from unit CM, [place], after a call from his wife about need of additional services  Pt was d/c on 05/10/13 with HHPT from [facility] service orders for [facility] entered per EDP permission Wife updated  Pt received a 3n1 05/10/13 Encouraged wife to try this in the shower also Insurance carrier will not cover shower chair  Wife appreciative of recommendations for cost concerns     Action/Plan:   CM spoke with Unit CM, wife (pt asleep) at bedside, [place] [facility] about requests from wife for additional DME, shower chair   Action/Plan Detail:   CM provided with update from [facility] coordinator with recommendations for cost efficient DME at local [employer]  1230 Left a voice  message for [facility] about new [facility] order Pending response fax confirmation for [email or web address]    Anticipated DC Date:  05/11/2013     Status Recommendation to Physician:   Result of Recommendation:    Other ED Services  Consult Working [place]  Other  Outpatient Services - Pt will follow up    Choice offered to / List presented to:    DME arranged  [place]     DME agency  OTHER - SEE NOTE   HH arranged  HH-1 RN      [facility] agency  [place]    Status of service:  Completed, signed off  ED Comments:   ED Comments Detail:   05/11/13 1403 Cm spoke with Hoyle Sauer at [facility]  home health [phone] to see if pt could be seen today per wife request for someone to show her how to change leg bag to foley. Hoyle Sauer confirmed no available staff for 05/11/13 but pt will be seen on 05/12/13 after their morning meeting CM called wife at home number [phone] to update her and she voiced understanding She also confirmed that ED RN reviewed with her prior to d/c how to change leg bag to foley

## 2013-05-11 NOTE — Progress Notes (Signed)
Pt's wife called to let us know that pt is at [facility] ER.  May be admitted.  Dr. Marin Olp made aware.

## 2013-05-12 LAB — URINE CULTURE
CULTURE: NO GROWTH
Colony Count: NO GROWTH

## 2013-05-13 ENCOUNTER — Telehealth: Payer: Self-pay | Admitting: *Deleted

## 2013-05-13 ENCOUNTER — Other Ambulatory Visit: Payer: Self-pay | Admitting: *Deleted

## 2013-05-13 MED ORDER — METOPROLOL TARTRATE 25 MG PO TABS: 50.0000 mg | ORAL_TABLET | Freq: Two times a day (BID) | ORAL | Status: DC

## 2013-05-13 NOTE — Telephone Encounter (Signed)
Referral made to [facility] - Dr Burney Gauze for urinary retention. Appt made for 06-12-13 at 2:00pm. Records faxed to [phone].   Pt's wife notified by phone of appt. dph

## 2013-05-13 NOTE — Telephone Encounter (Signed)
Patient wife and home health nurse called with a few issues.  One, patient is experiencing weakness in Right eye lid.  Left is opening and closing fine.  Patient had restless night with pain. Wife put Fentanyl patch 12.5 mcg back on patient .  Takes Oxycodone 10-20 mg every 4 hours prn pain.  Patient currently sleeping and comfortable.  Told Dr. Marin Olp this information.  Dr. Marin Olp is aware and no orders received for above. Patient BP running 178/100 and 180/98. Patient currently on Lopressor 25 mg bid .  Dr. Marin Olp increased Lopressor to 50 mg bid.  Orders given to home health nurse.  Debbie.  [phone]

## 2013-05-13 NOTE — Progress Notes (Signed)
Received call from Physical Therapy Dept at [facility] requesting verbal order from Dr Marin Olp to continue PT bid x 4 weeks for progressive exercise and weakness. OK  To continue per Dr PE.  Verbal order given and read back. dph

## 2013-05-14 ENCOUNTER — Encounter: Payer: Self-pay | Admitting: Nurse Practitioner

## 2013-05-14 ENCOUNTER — Other Ambulatory Visit: Payer: Self-pay | Admitting: Nurse Practitioner

## 2013-05-14 MED ORDER — TOBRAMYCIN-DEXAMETHASONE 0.3-0.1 % OP OINT: 1.0000 "application " | TOPICAL_OINTMENT | Freq: Three times a day (TID) | OPHTHALMIC | Status: AC

## 2013-05-14 NOTE — Progress Notes (Signed)
Received a call from [place] with [facility] stating pt's wife was concerned about increase in bp. At this time bp is 170/98. Per Dr. Marin Olp pt instructed to continue taking his bp as normal. Also pt is to continue with his Fentanyl patches but he can stop the [facility] medication. The nurse also stated she assessed and it appears he has some kind of R eye conjunctivitis. A rx has been called in for Tobradex to their family pharmacy and the wife verbalized understanding .

## 2013-05-17 ENCOUNTER — Other Ambulatory Visit: Payer: Self-pay | Admitting: Nurse Practitioner

## 2013-05-17 ENCOUNTER — Ambulatory Visit (HOSPITAL_BASED_OUTPATIENT_CLINIC_OR_DEPARTMENT_OTHER): Payer: Medicare Other

## 2013-05-17 ENCOUNTER — Encounter (HOSPITAL_COMMUNITY): Payer: Self-pay | Admitting: *Deleted

## 2013-05-17 ENCOUNTER — Other Ambulatory Visit (HOSPITAL_BASED_OUTPATIENT_CLINIC_OR_DEPARTMENT_OTHER): Payer: Medicare Other | Admitting: Lab

## 2013-05-17 ENCOUNTER — Ambulatory Visit (HOSPITAL_BASED_OUTPATIENT_CLINIC_OR_DEPARTMENT_OTHER): Payer: Medicare Other | Admitting: Hematology & Oncology

## 2013-05-17 ENCOUNTER — Inpatient Hospital Stay (HOSPITAL_COMMUNITY)
Admission: AD | Admit: 2013-05-17 | Discharge: 2013-06-18 | DRG: 840 | Disposition: E | Payer: Medicare Other | Source: Ambulatory Visit | Attending: Hematology & Oncology | Admitting: Hematology & Oncology

## 2013-05-17 VITALS — BP 162/80 | HR 82 | Temp 97.9°F | Resp 16

## 2013-05-17 DIAGNOSIS — N390 Urinary tract infection, site not specified: Secondary | ICD-10-CM | POA: Diagnosis present

## 2013-05-17 DIAGNOSIS — M6250 Muscle wasting and atrophy, not elsewhere classified, unspecified site: Secondary | ICD-10-CM | POA: Diagnosis present

## 2013-05-17 DIAGNOSIS — T17408A Unspecified foreign body in trachea causing other injury, initial encounter: Secondary | ICD-10-CM | POA: Diagnosis not present

## 2013-05-17 DIAGNOSIS — C8589 Other specified types of non-Hodgkin lymphoma, extranodal and solid organ sites: Secondary | ICD-10-CM

## 2013-05-17 DIAGNOSIS — T451X5A Adverse effect of antineoplastic and immunosuppressive drugs, initial encounter: Secondary | ICD-10-CM | POA: Diagnosis present

## 2013-05-17 DIAGNOSIS — R51 Headache: Secondary | ICD-10-CM

## 2013-05-17 DIAGNOSIS — E119 Type 2 diabetes mellitus without complications: Secondary | ICD-10-CM | POA: Diagnosis present

## 2013-05-17 DIAGNOSIS — C8590 Non-Hodgkin lymphoma, unspecified, unspecified site: Secondary | ICD-10-CM

## 2013-05-17 DIAGNOSIS — E860 Dehydration: Secondary | ICD-10-CM

## 2013-05-17 DIAGNOSIS — D61810 Antineoplastic chemotherapy induced pancytopenia: Secondary | ICD-10-CM | POA: Diagnosis present

## 2013-05-17 DIAGNOSIS — E876 Hypokalemia: Secondary | ICD-10-CM | POA: Diagnosis present

## 2013-05-17 DIAGNOSIS — T17808A Unspecified foreign body in other parts of respiratory tract causing other injury, initial encounter: Secondary | ICD-10-CM

## 2013-05-17 DIAGNOSIS — R52 Pain, unspecified: Secondary | ICD-10-CM

## 2013-05-17 DIAGNOSIS — D638 Anemia in other chronic diseases classified elsewhere: Secondary | ICD-10-CM

## 2013-05-17 DIAGNOSIS — H44009 Unspecified purulent endophthalmitis, unspecified eye: Secondary | ICD-10-CM

## 2013-05-17 DIAGNOSIS — M25559 Pain in unspecified hip: Secondary | ICD-10-CM

## 2013-05-17 DIAGNOSIS — F411 Generalized anxiety disorder: Secondary | ICD-10-CM | POA: Diagnosis present

## 2013-05-17 DIAGNOSIS — Z794 Long term (current) use of insulin: Secondary | ICD-10-CM

## 2013-05-17 DIAGNOSIS — Z8249 Family history of ischemic heart disease and other diseases of the circulatory system: Secondary | ICD-10-CM

## 2013-05-17 DIAGNOSIS — Z9221 Personal history of antineoplastic chemotherapy: Secondary | ICD-10-CM

## 2013-05-17 DIAGNOSIS — R233 Spontaneous ecchymoses: Secondary | ICD-10-CM | POA: Diagnosis present

## 2013-05-17 DIAGNOSIS — R64 Cachexia: Secondary | ICD-10-CM

## 2013-05-17 DIAGNOSIS — D6959 Other secondary thrombocytopenia: Secondary | ICD-10-CM | POA: Diagnosis present

## 2013-05-17 DIAGNOSIS — IMO0002 Reserved for concepts with insufficient information to code with codable children: Secondary | ICD-10-CM | POA: Diagnosis not present

## 2013-05-17 DIAGNOSIS — I10 Essential (primary) hypertension: Secondary | ICD-10-CM

## 2013-05-17 DIAGNOSIS — R5381 Other malaise: Secondary | ICD-10-CM | POA: Diagnosis present

## 2013-05-17 DIAGNOSIS — Z79899 Other long term (current) drug therapy: Secondary | ICD-10-CM

## 2013-05-17 DIAGNOSIS — M549 Dorsalgia, unspecified: Secondary | ICD-10-CM | POA: Diagnosis present

## 2013-05-17 DIAGNOSIS — G510 Bell's palsy: Secondary | ICD-10-CM | POA: Diagnosis present

## 2013-05-17 DIAGNOSIS — C8339 Primary central nervous system lymphoma: Secondary | ICD-10-CM

## 2013-05-17 DIAGNOSIS — R1310 Dysphagia, unspecified: Secondary | ICD-10-CM | POA: Diagnosis present

## 2013-05-17 DIAGNOSIS — Z66 Do not resuscitate: Secondary | ICD-10-CM

## 2013-05-17 DIAGNOSIS — R339 Retention of urine, unspecified: Secondary | ICD-10-CM | POA: Diagnosis present

## 2013-05-17 DIAGNOSIS — R627 Adult failure to thrive: Secondary | ICD-10-CM

## 2013-05-17 DIAGNOSIS — E43 Unspecified severe protein-calorie malnutrition: Secondary | ICD-10-CM | POA: Diagnosis present

## 2013-05-17 DIAGNOSIS — M62838 Other muscle spasm: Secondary | ICD-10-CM | POA: Diagnosis present

## 2013-05-17 DIAGNOSIS — R7309 Other abnormal glucose: Secondary | ICD-10-CM

## 2013-05-17 DIAGNOSIS — R5383 Other fatigue: Secondary | ICD-10-CM

## 2013-05-17 DIAGNOSIS — C8290 Follicular lymphoma, unspecified, unspecified site: Secondary | ICD-10-CM

## 2013-05-17 DIAGNOSIS — B958 Unspecified staphylococcus as the cause of diseases classified elsewhere: Secondary | ICD-10-CM | POA: Diagnosis present

## 2013-05-17 DIAGNOSIS — C799 Secondary malignant neoplasm of unspecified site: Secondary | ICD-10-CM

## 2013-05-17 DIAGNOSIS — T17508A Unspecified foreign body in bronchus causing other injury, initial encounter: Secondary | ICD-10-CM

## 2013-05-17 DIAGNOSIS — J690 Pneumonitis due to inhalation of food and vomit: Secondary | ICD-10-CM | POA: Diagnosis not present

## 2013-05-17 DIAGNOSIS — Z7401 Bed confinement status: Secondary | ICD-10-CM

## 2013-05-17 DIAGNOSIS — R634 Abnormal weight loss: Secondary | ICD-10-CM | POA: Diagnosis present

## 2013-05-17 DIAGNOSIS — H5789 Other specified disorders of eye and adnexa: Secondary | ICD-10-CM | POA: Diagnosis present

## 2013-05-17 DIAGNOSIS — Z515 Encounter for palliative care: Secondary | ICD-10-CM

## 2013-05-17 LAB — CBC WITH DIFFERENTIAL (CANCER CENTER ONLY)
BASO#: 0 10*3/uL (ref 0.0–0.2)
BASO%: 0.2 % (ref 0.0–2.0)
EOS%: 0.2 % (ref 0.0–7.0)
Eosinophils Absolute: 0 10*3/uL (ref 0.0–0.5)
HCT: 36 % — ABNORMAL LOW (ref 38.7–49.9)
HEMOGLOBIN: 12.5 g/dL — AB (ref 13.0–17.1)
LYMPH#: 0.1 10*3/uL — AB (ref 0.9–3.3)
LYMPH%: 1.5 % — ABNORMAL LOW (ref 14.0–48.0)
MCH: 31.1 pg (ref 28.0–33.4)
MCHC: 34.7 g/dL (ref 32.0–35.9)
MCV: 90 fL (ref 82–98)
MONO#: 0.1 10*3/uL (ref 0.1–0.9)
MONO%: 2 % (ref 0.0–13.0)
NEUT%: 96.1 % — ABNORMAL HIGH (ref 40.0–80.0)
NEUTROS ABS: 6.2 10*3/uL (ref 1.5–6.5)
PLATELETS: 62 10*3/uL — AB (ref 145–400)
RBC: 4.02 10*6/uL — AB (ref 4.20–5.70)
RDW: 16.3 % — ABNORMAL HIGH (ref 11.1–15.7)
WBC: 6.5 10*3/uL (ref 4.0–10.0)

## 2013-05-17 LAB — URINALYSIS, MICROSCOPIC (CHCC SATELLITE)
Bilirubin (Urine): NEGATIVE
Glucose: NEGATIVE mg/dL
KETONES: NEGATIVE mg/dL
Leukocyte Esterase: NEGATIVE
Nitrite: NEGATIVE
PROTEIN: 30 mg/dL
Specific Gravity, Urine: 1.02 (ref 1.003–1.035)
Urobilinogen, UR: 0.2 mg/dL (ref 0.2–1)
pH: 6 (ref 4.60–8.00)

## 2013-05-17 LAB — CMP (CANCER CENTER ONLY)
ALT: 17 U/L (ref 10–47)
AST: 12 U/L (ref 11–38)
Albumin: 2.7 g/dL — ABNORMAL LOW (ref 3.3–5.5)
Alkaline Phosphatase: 84 U/L (ref 26–84)
BILIRUBIN TOTAL: 1 mg/dL (ref 0.20–1.60)
BUN, Bld: 26 mg/dL — ABNORMAL HIGH (ref 7–22)
CO2: 27 mEq/L (ref 18–33)
CREATININE: 0.6 mg/dL (ref 0.6–1.2)
Calcium: 8.7 mg/dL (ref 8.0–10.3)
Chloride: 103 mEq/L (ref 98–108)
Glucose, Bld: 227 mg/dL — ABNORMAL HIGH (ref 73–118)
Potassium: 4 mEq/L (ref 3.3–4.7)
SODIUM: 137 meq/L (ref 128–145)
TOTAL PROTEIN: 5.2 g/dL — AB (ref 6.4–8.1)

## 2013-05-17 LAB — LACTATE DEHYDROGENASE: LDH: 183 U/L (ref 94–250)

## 2013-05-17 LAB — PREALBUMIN: PREALBUMIN: 29.8 mg/dL (ref 17.0–34.0)

## 2013-05-17 MED ORDER — FENTANYL 12 MCG/HR TD PT72: 12.5000 ug | MEDICATED_PATCH | TRANSDERMAL | Status: DC

## 2013-05-17 MED ORDER — INSULIN ASPART 100 UNIT/ML FLEXPEN: 5.0000 [IU] | PEN_INJECTOR | Freq: Three times a day (TID) | SUBCUTANEOUS | Status: DC

## 2013-05-17 MED ORDER — FLUCONAZOLE IN SODIUM CHLORIDE 400-0.9 MG/200ML-% IV SOLN: 200.0000 mg | Freq: Once | INTRAVENOUS | Status: DC

## 2013-05-17 MED ORDER — INSULIN REGULAR HUMAN 100 UNIT/ML IJ SOLN
10.0000 [IU] | Freq: Once | INTRAMUSCULAR | Status: AC
Start: 2013-05-17 — End: ?

## 2013-05-17 MED ORDER — HYDROMORPHONE HCL PF 2 MG/ML IJ SOLN
INTRAMUSCULAR | Status: AC
  Filled 2013-05-17: qty 1

## 2013-05-17 MED ORDER — HYDROMORPHONE HCL PF 1 MG/ML IJ SOLN
1.0000 mg | INTRAMUSCULAR | Status: DC | PRN
  Administered 2013-05-18 (×2): 1 mg via INTRAVENOUS
  Filled 2013-05-17 (×2): qty 1

## 2013-05-17 MED ORDER — DEXAMETHASONE SODIUM PHOSPHATE 20 MG/5ML IJ SOLN
40.0000 mg | Freq: Once | INTRAMUSCULAR | Status: AC
  Administered 2013-05-17: 40 mg via INTRAVENOUS

## 2013-05-17 MED ORDER — SODIUM CHLORIDE 0.9 % IV SOLN
INTRAVENOUS | Status: AC
Start: 2013-05-17 — End: ?
  Administered 2013-05-17 (×3): via INTRAVENOUS

## 2013-05-17 MED ORDER — DEXAMETHASONE SODIUM PHOSPHATE 4 MG/ML IJ SOLN: 40.0000 mg | INTRAMUSCULAR | Status: DC

## 2013-05-17 MED ORDER — TAMSULOSIN HCL 0.4 MG PO CAPS
0.8000 mg | ORAL_CAPSULE | Freq: Every day | ORAL | Status: DC | PRN
  Filled 2013-05-17: qty 2

## 2013-05-17 MED ORDER — TOBRAMYCIN-DEXAMETHASONE 0.3-0.1 % OP OINT
1.0000 "application " | TOPICAL_OINTMENT | Freq: Three times a day (TID) | OPHTHALMIC | Status: DC
  Administered 2013-05-17 – 2013-05-23 (×17): 1 via OPHTHALMIC
  Filled 2013-05-17 (×2): qty 3.5

## 2013-05-17 MED ORDER — SODIUM CHLORIDE 0.9 % IV SOLN
40.0000 mg | Freq: Every day | INTRAVENOUS | Status: AC
  Administered 2013-05-18 – 2013-05-20 (×3): 40 mg via INTRAVENOUS
  Filled 2013-05-17 (×3): qty 4

## 2013-05-17 MED ORDER — METOPROLOL TARTRATE 50 MG PO TABS
50.0000 mg | ORAL_TABLET | Freq: Two times a day (BID) | ORAL | Status: DC
  Filled 2013-05-17: qty 1

## 2013-05-17 MED ORDER — SODIUM CHLORIDE 0.9 % IV SOLN
INTRAVENOUS | Status: DC
  Administered 2013-05-17 – 2013-05-22 (×8): via INTRAVENOUS
  Administered 2013-05-23: 1000 mL via INTRAVENOUS
  Administered 2013-05-24 (×2): via INTRAVENOUS

## 2013-05-17 MED ORDER — DEXAMETHASONE SODIUM PHOSPHATE 20 MG/5ML IJ SOLN
INTRAMUSCULAR | Status: AC
  Filled 2013-05-17: qty 5

## 2013-05-17 MED ORDER — FLUCONAZOLE IN SODIUM CHLORIDE 400-0.9 MG/200ML-% IV SOLN
200.0000 mg | Freq: Once | INTRAVENOUS | Status: AC
  Administered 2013-05-17: 200 mg via INTRAVENOUS
  Filled 2013-05-17: qty 200

## 2013-05-17 MED ORDER — HYDROMORPHONE HCL PF 1 MG/ML IJ SOLN
1.0000 mg | Freq: Once | INTRAMUSCULAR | Status: AC
  Filled 2013-05-17: qty 1

## 2013-05-17 MED ORDER — INSULIN ASPART 100 UNIT/ML ~~LOC~~ SOLN: 5.0000 [IU] | Freq: Three times a day (TID) | SUBCUTANEOUS | Status: DC

## 2013-05-17 MED ORDER — LORAZEPAM 0.5 MG PO TABS: 0.5000 mg | ORAL_TABLET | Freq: Four times a day (QID) | ORAL | Status: DC | PRN

## 2013-05-17 MED ORDER — SODIUM CHLORIDE 0.9 % IV SOLN
1.5000 g | Freq: Three times a day (TID) | INTRAVENOUS | Status: DC
  Administered 2013-05-17 – 2013-05-22 (×14): 1.5 g via INTRAVENOUS
  Filled 2013-05-17 (×17): qty 1.5

## 2013-05-17 NOTE — Progress Notes (Unsigned)
Pt is being escorted from office to [facility] hospital room 1302. Wife will be escorted via [facility] and signed waiver. Pt's [place] has remained access for transport and IVF continuous. Foley emptied with total of 734ml of cloudy yellow urine. Dr. Marin Olp aware of transport.

## 2013-05-17 NOTE — Progress Notes (Signed)
Keith Duffy is brought in by his wife. He was just discharged from the hospital a week ago. He has declined quickly at home. She called me a weekend. It is clear that he does is not doing well at home. He is not eating. He is dehydrated. He is in pain. He is not eating. He's not having nausea vomiting. She's not having diarrhea.  He went to the local emergency room yesterday. He was dehydrated. He got fluids. I saw lab work. Nothing really looked out of line.  He has an infection in the right eye. We did culture this. He is on ointment for this with TobraDex.  When his wife called me on Saturday, I told her to get him back on Decadron. I thought this would help. This has not helped.  He has cachexia. His muscles are atrophied. Now has more pain in the left hip.  Had none of this would is discharged. His heart throughout what happened at home. His wife is a great job with him. She's to call that she can.  A get home health. They pretty much were useless. They really did not help. This was a waste. I think we're going to have to get hospice.  I'm going have to admit him. I know that he does not want to be admitted. However, I don't think that he really understands what is going on.  We had made him a DO NOT RESUSCITATE. He stopped his wife about this. She says he does not want to live like this. I totally agree with this.  We will get an he was to send him over to the hospital. We will get IV fluids in him. I will start some IV medicines. We will see about speech pathology about his swallowing.  I just will have to take some more fluid from his Ommaya reservoir. We last checked this just a week or so ago and the fluid was negative for lymphoma.  We will see him over at the hospital in the morning.  He was in our office for about 6 hours today. The nurses to a great job with him.

## 2013-05-18 ENCOUNTER — Other Ambulatory Visit: Payer: Medicare Other | Admitting: Lab

## 2013-05-18 ENCOUNTER — Ambulatory Visit: Payer: Medicare Other | Admitting: Hematology & Oncology

## 2013-05-18 ENCOUNTER — Ambulatory Visit: Payer: Medicare Other

## 2013-05-18 DIAGNOSIS — M25559 Pain in unspecified hip: Secondary | ICD-10-CM

## 2013-05-18 DIAGNOSIS — E46 Unspecified protein-calorie malnutrition: Secondary | ICD-10-CM

## 2013-05-18 LAB — COMPREHENSIVE METABOLIC PANEL
ALT: 12 U/L (ref 0–53)
AST: 10 U/L (ref 0–37)
Albumin: 2.1 g/dL — ABNORMAL LOW (ref 3.5–5.2)
Alkaline Phosphatase: 81 U/L (ref 39–117)
BUN: 18 mg/dL (ref 6–23)
CALCIUM: 7.8 mg/dL — AB (ref 8.4–10.5)
CO2: 22 mEq/L (ref 19–32)
Chloride: 106 mEq/L (ref 96–112)
Creatinine, Ser: 0.47 mg/dL — ABNORMAL LOW (ref 0.50–1.35)
GFR calc Af Amer: >90 mL/min (ref >90)
Glucose, Bld: 172 mg/dL — ABNORMAL HIGH (ref 70–99)
Potassium: 3.1 mEq/L — ABNORMAL LOW (ref 3.7–5.3)
SODIUM: 138 meq/L (ref 137–147)
Total Bilirubin: 0.5 mg/dL (ref 0.3–1.2)
Total Protein: 4.4 g/dL — ABNORMAL LOW (ref 6.0–8.3)

## 2013-05-18 LAB — TSH: TSH: 0.5 u[IU]/mL (ref 0.350–4.500)

## 2013-05-18 LAB — CBC
HCT: 30.2 % — ABNORMAL LOW (ref 39.0–52.0)
Hemoglobin: 10.7 g/dL — ABNORMAL LOW (ref 13.0–17.0)
MCH: 31.4 pg (ref 26.0–34.0)
MCHC: 35.4 g/dL (ref 30.0–36.0)
MCV: 88.6 fL (ref 78.0–100.0)
Platelets: 75 10*3/uL — ABNORMAL LOW (ref 150–400)
RBC: 3.41 MIL/uL — ABNORMAL LOW (ref 4.22–5.81)
RDW: 16 % — ABNORMAL HIGH (ref 11.5–15.5)
WBC: 6 10*3/uL (ref 4.0–10.5)

## 2013-05-18 LAB — GLUCOSE, CAPILLARY
GLUCOSE-CAPILLARY: 156 mg/dL — AB (ref 70–99)
GLUCOSE-CAPILLARY: 163 mg/dL — AB (ref 70–99)
Glucose-Capillary: 153 mg/dL — ABNORMAL HIGH (ref 70–99)
Glucose-Capillary: 142 mg/dL — ABNORMAL HIGH (ref 70–99)

## 2013-05-18 LAB — TESTOSTERONE: Testosterone: 19 ng/dL — ABNORMAL LOW (ref 300–890)

## 2013-05-18 LAB — PHOSPHORUS: Phosphorus: 2.6 mg/dL (ref 2.3–4.6)

## 2013-05-18 LAB — MAGNESIUM: Magnesium: 1.6 mg/dL (ref 1.5–2.5)

## 2013-05-18 MED ORDER — POTASSIUM CHLORIDE 10 MEQ/50ML IV SOLN
10.0000 meq | INTRAVENOUS | Status: AC
  Administered 2013-05-18 (×5): 10 meq via INTRAVENOUS
  Filled 2013-05-18 (×5): qty 50

## 2013-05-18 MED ORDER — HYDRALAZINE HCL 20 MG/ML IJ SOLN
10.0000 mg | Freq: Four times a day (QID) | INTRAMUSCULAR | Status: DC | PRN
  Administered 2013-05-18 – 2013-05-19 (×2): 10 mg via INTRAVENOUS
  Filled 2013-05-18 (×2): qty 0.5

## 2013-05-18 MED ORDER — FLUCONAZOLE IN SODIUM CHLORIDE 200-0.9 MG/100ML-% IV SOLN
200.0000 mg | INTRAVENOUS | Status: DC
  Administered 2013-05-18 – 2013-05-19 (×2): 200 mg via INTRAVENOUS
  Filled 2013-05-18 (×3): qty 100

## 2013-05-18 MED ORDER — INSULIN ASPART 100 UNIT/ML ~~LOC~~ SOLN: 5.0000 [IU] | Freq: Three times a day (TID) | SUBCUTANEOUS | Status: DC

## 2013-05-18 MED ORDER — FENTANYL 12 MCG/HR TD PT72
12.5000 ug | MEDICATED_PATCH | TRANSDERMAL | Status: DC
  Administered 2013-05-18: 12.5 ug via TRANSDERMAL
  Filled 2013-05-18: qty 1

## 2013-05-18 MED ORDER — INSULIN ASPART 100 UNIT/ML ~~LOC~~ SOLN
0.0000 [IU] | SUBCUTANEOUS | Status: DC
  Administered 2013-05-18: 2 [IU] via SUBCUTANEOUS
  Administered 2013-05-18: 4 [IU] via SUBCUTANEOUS
  Administered 2013-05-18 – 2013-05-19 (×2): 2 [IU] via SUBCUTANEOUS
  Administered 2013-05-19 (×2): 4 [IU] via SUBCUTANEOUS
  Administered 2013-05-19: 2 [IU] via SUBCUTANEOUS
  Administered 2013-05-20: 4 [IU] via SUBCUTANEOUS
  Administered 2013-05-20 (×3): 2 [IU] via SUBCUTANEOUS
  Administered 2013-05-20: 4 [IU] via SUBCUTANEOUS
  Administered 2013-05-21 (×2): 12 [IU] via SUBCUTANEOUS
  Administered 2013-05-21 (×2): 8 [IU] via SUBCUTANEOUS
  Administered 2013-05-21: 4 [IU] via SUBCUTANEOUS
  Administered 2013-05-21: 8 [IU] via SUBCUTANEOUS
  Administered 2013-05-22: 2 [IU] via SUBCUTANEOUS
  Administered 2013-05-22: 4 [IU] via SUBCUTANEOUS
  Administered 2013-05-22 (×2): 8 [IU] via SUBCUTANEOUS
  Administered 2013-05-22 – 2013-05-23 (×3): 12 [IU] via SUBCUTANEOUS
  Administered 2013-05-23: 8 [IU] via SUBCUTANEOUS
  Administered 2013-05-23: 2 [IU] via SUBCUTANEOUS
  Administered 2013-05-23: 8 [IU] via SUBCUTANEOUS
  Administered 2013-05-23: 12 [IU] via SUBCUTANEOUS
  Administered 2013-05-23: 8 [IU] via SUBCUTANEOUS
  Administered 2013-05-24: 2 [IU] via SUBCUTANEOUS
  Administered 2013-05-24: 12 [IU] via SUBCUTANEOUS
  Administered 2013-05-24 (×2): 2 [IU] via SUBCUTANEOUS
  Administered 2013-05-24: 4 [IU] via SUBCUTANEOUS

## 2013-05-18 MED ORDER — BUTALBITAL-APAP-CAFFEINE 50-325-40 MG PO TABS: 1.0000 | ORAL_TABLET | Freq: Four times a day (QID) | ORAL | Status: DC | PRN

## 2013-05-18 MED ORDER — METOPROLOL TARTRATE 1 MG/ML IV SOLN
5.0000 mg | Freq: Four times a day (QID) | INTRAVENOUS | Status: DC
  Administered 2013-05-18 – 2013-05-20 (×10): 5 mg via INTRAVENOUS
  Filled 2013-05-18 (×15): qty 5

## 2013-05-18 MED ORDER — HYDROMORPHONE HCL PF 1 MG/ML IJ SOLN
1.0000 mg | INTRAMUSCULAR | Status: DC | PRN
  Administered 2013-05-18 – 2013-05-19 (×5): 1 mg via INTRAVENOUS
  Administered 2013-05-21: 2 mg via INTRAVENOUS
  Administered 2013-05-21 – 2013-05-22 (×3): 1 mg via INTRAVENOUS
  Administered 2013-05-23 – 2013-05-24 (×7): 2 mg via INTRAVENOUS
  Filled 2013-05-18: qty 2
  Filled 2013-05-18: qty 1
  Filled 2013-05-18: qty 2
  Filled 2013-05-18 (×2): qty 1
  Filled 2013-05-18: qty 2
  Filled 2013-05-18: qty 1
  Filled 2013-05-18: qty 2
  Filled 2013-05-18: qty 1
  Filled 2013-05-18 (×6): qty 2
  Filled 2013-05-18: qty 1

## 2013-05-18 NOTE — Progress Notes (Signed)
Mr. Schue might be a little bit better. He still is somewhat lethargic. Still is having a hard time opening his right eye.  I did take some spinal fluid out of the Ommaya reservoir. We did this sterilely. We obtained 4 cc of fluid. This was clear fluid.  He has significant protein calorie malnutrition. I am surprised that his preop pre-albumin is as high as it is.  He still is having some tough times with the left hip. It still is not clear as to what is going on with a hip. He is not going to tolerate any type of scan.  His potassium was 3.1. His renal function looks okay. Albumin is 2.1. His platelet count was 75.  He is on Unasyn for the right eye.  He still has the Foley catheter in. I will try to get this removed while he is inpatient.  We still are awaiting the input from pathology about his swallowing.  His vital signs were okay. Blood pressure 136/60. Temperature 90.9 degrees. His oral cavity still somewhat dry. He has dried secretions. He has a little bit of a gag reflex. His lungs are clear. Cardiac exam regular rate and rhythm. Abdomen soft. Extremities showed muscle atrophy which is a significant. He can move his extremities. He has problems with his left hip. Skin exam shows some ecchymoses and petechia.  For now, we will continue the IV fluids. I am going to check a testosterone level. I will check a TSH.  Am not sure how much better we can get him. I do want to try to get him somewhat better so he can go home.

## 2013-05-18 NOTE — Care Management Note (Signed)
Cm spoke with patient and spouse at the bedside concerning Cm consult to offer home hospice choice and medication assistance. Per pt's spouse dissatified with care provided by current home health agency. Pt provided with list of home hospice agencies in [place]. Cm informed patient's spouse Home Hospice focus on QOL and comfort measures. Patient's spouse informed Oncologist can continue to follow patient at home with hospice. Cm offered information concerning private duty care agencies for supplemental care providers. Will continue to follow.    Venita Lick Joseline Mccampbell,MSN,RN [phone]

## 2013-05-18 NOTE — Progress Notes (Signed)
Rx Brief Medication dosing request note:  IV Lopressor  Assessement:  Pt with difficulty swallowing medications.    Pt on Lopressor 50mg  bid @ home.  2118 BP=158/87, 2118 HR=81  Plan:  Will order IV Lopressor 5mg  IV q6h for now  MD may consider adding  hold parameters for order.    May need to adjust dose for BPs.  Tx! Dorrene German 05/18/2013 1:25 AM

## 2013-05-18 NOTE — H&P (Signed)
Keith Duffy is an 68 y.o. male.    Chief Complaint: weakness, not eating   HPI: see office note   ROS: see office note  Past Medical History  Diagnosis Date  . Back pain   . Hypertension   . Diabetes mellitus without complication   . Cancer     non hodkins lymphoma    Past Surgical History  Procedure Laterality Date  . Chemo reservior insertion N/A 03/15/2013    Procedure: CHEMO RESERVOIR INSERTION;  Surgeon: Ophelia Charter, MD;  Location: [place] [facility];  Service: Neurosurgery;  Laterality: N/A;  right    Family History  Problem Relation Age of Onset  . Heart attack Father   . Heart attack Maternal Grandfather     Social History:  reports that he has never smoked. He has never used smokeless tobacco. He reports that he does not drink alcohol or use illicit drugs.  Allergies:  Allergies  Allergen Reactions  . Aspartame And Phenylalanine Diarrhea  . Codeine Other (See Comments)    "Made me go wild when I was young"    Medications Prior to Admission  Medication Sig Dispense Refill  . fentaNYL (DURAGESIC - DOSED MCG/HR) 12 MCG/HR Place 12.5 mcg onto the skin every 3 (three) days.      . insulin aspart (NOVOLOG) 100 UNIT/ML FlexPen Inject into the skin 3 (three) times daily with meals. CBG < 70: eat or drink something sweet & recheck blood sugar.    HOLD CBG 70 - 120: 0 units  CBG 121 - 150: 1 unit  CBG 151 - 200: 2 units  CBG 201 - 250: 3 units  CBG 251 - 300: 5 units  CBG 301 - 350: 7 units  CBG 351 - 400: 9 units  CBG > 400: call MD      . Insulin Glargine (LANTUS SOLOSTAR) 100 UNIT/ML Solostar Pen Inject 25 Units into the skin at bedtime.       Marland Kitchen LORazepam (ATIVAN) 0.5 MG tablet Take 1 tablet (0.5 mg total) by mouth every 6 (six) hours as needed (nausea and vomiting).  60 tablet  0  . Oxycodone HCl 10 MG TABS Take 10-20 mg by mouth every 4 (four) hours as needed (breakthrough pain).      . pantoprazole (PROTONIX) 40 MG tablet Take 1 tablet (40 mg total) by  mouth daily.  30 tablet  3  . prochlorperazine (COMPAZINE) 10 MG tablet Take 1 tablet (10 mg total) by mouth every 6 (six) hours as needed (Nausea or vomiting).  30 tablet  1  . saccharomyces boulardii (FLORASTOR) 250 MG capsule Take 1 capsule (250 mg total) by mouth 2 (two) times daily.  30 capsule  4  . tamsulosin (FLOMAX) 0.4 MG CAPS capsule Take 0.4 mg by mouth daily as needed (for urinary flow).       . tobramycin-dexamethasone (TOBRADEX) ophthalmic ointment Place 1 application into the right eye 3 (three) times daily.  3.5 g  1  . [DISCONTINUED] dexamethasone (DECADRON) 4 MG tablet Take 1 tablet (4 mg total) by mouth 3 (three) times daily.  60 tablet  2  . [DISCONTINUED] metoprolol tartrate (LOPRESSOR) 25 MG tablet Take 25 mg by mouth 2 (two) times daily.      . [DISCONTINUED] metoprolol tartrate (LOPRESSOR) 25 MG tablet Take 2 tablets (50 mg total) by mouth 2 (two) times daily. 2 tablets every morning and 2 tablets at lunch  120 tablet  3    Results for  orders placed during the hospital encounter of 04/19/2013 (from the past 48 hour(s))  CBC     Status: Abnormal   Collection Time    05/18/13  6:20 AM      Result Value Ref Range   WBC 6.0  4.0 - 10.5 K/uL   RBC 3.41 (*) 4.22 - 5.81 MIL/uL   Hemoglobin 10.7 (*) 13.0 - 17.0 g/dL   HCT 30.2 (*) 39.0 - 52.0 %   MCV 88.6  78.0 - 100.0 fL   MCH 31.4  26.0 - 34.0 pg   MCHC 35.4  30.0 - 36.0 g/dL   RDW 16.0 (*) 11.5 - 15.5 %   Platelets 75 (*) 150 - 400 K/uL   Comment: SPECIMEN CHECKED FOR CLOTS     REPEATED TO VERIFY     PLATELET COUNT CONFIRMED BY SMEAR  COMPREHENSIVE METABOLIC PANEL     Status: Abnormal   Collection Time    05/18/13  6:20 AM      Result Value Ref Range   Sodium 138  137 - 147 mEq/L   Potassium 3.1 (*) 3.7 - 5.3 mEq/L   Chloride 106  96 - 112 mEq/L   CO2 22  19 - 32 mEq/L   Glucose, Bld 172 (*) 70 - 99 mg/dL   BUN 18  6 - 23 mg/dL   Creatinine, Ser 0.47 (*) 0.50 - 1.35 mg/dL   Calcium 7.8 (*) 8.4 - 10.5 mg/dL    Total Protein 4.4 (*) 6.0 - 8.3 g/dL   Albumin 2.1 (*) 3.5 - 5.2 g/dL   AST 10  0 - 37 U/L   ALT 12  0 - 53 U/L   Alkaline Phosphatase 81  39 - 117 U/L   Total Bilirubin 0.5  0.3 - 1.2 mg/dL   GFR calc non Af Amer >90  >90 mL/min   GFR calc Af Amer >90  >90 mL/min   Comment: (NOTE)     The eGFR has been calculated using the CKD EPI equation.     This calculation has not been validated in all clinical situations.     eGFR's persistently <90 mL/min signify possible Chronic Kidney     Disease.  MAGNESIUM     Status: None   Collection Time    05/18/13  6:20 AM      Result Value Ref Range   Magnesium 1.6  1.5 - 2.5 mg/dL  PHOSPHORUS     Status: None   Collection Time    05/18/13  6:20 AM      Result Value Ref Range   Phosphorus 2.6  2.3 - 4.6 mg/dL  GLUCOSE, CAPILLARY     Status: Abnormal   Collection Time    05/18/13  7:38 AM      Result Value Ref Range   Glucose-Capillary 156 (*) 70 - 99 mg/dL   Comment 1 Documented in Chart     Comment 2 Notify RN    TESTOSTERONE     Status: Abnormal   Collection Time    05/18/13  8:55 AM      Result Value Ref Range   Testosterone 19 (*) 300 - 890 ng/dL   Comment: (NOTE)              Tanner Stage       Male              Male  I              < 30 ng/dL        < 10 ng/dL                  II             < 150 ng/dL       < 30 ng/dL                  III            100-320 ng/dL     < 35 ng/dL                  IV             200-970 ng/dL     15-40 ng/dL                  V/Adult        300-890 ng/dL     10-70 ng/dL     Performed at [facility]  TSH     Status: None   Collection Time    05/18/13  8:55 AM      Result Value Ref Range   TSH 0.500  0.350 - 4.500 uIU/mL   Comment: Please note change in reference range.     Performed at [place], CAPILLARY     Status: Abnormal   Collection Time    05/18/13 12:05 PM      Result Value Ref Range   Glucose-Capillary 153 (*) 70 - 99 mg/dL   Comment 1  Documented in Chart     Comment 2 Notify RN    GLUCOSE, CAPILLARY     Status: Abnormal   Collection Time    05/18/13  4:17 PM      Result Value Ref Range   Glucose-Capillary 163 (*) 70 - 99 mg/dL   Comment 1 Documented in Chart     Comment 2 Notify RN      No results found.   BP 184/105  Pulse 83  Temp(Src) 97.7 F (36.5 C) (Axillary)  Resp 12  Ht $R'5\' 9"'YN$  (1.753 m)  Wt 167 lb 9.6 oz (76.023 kg)  BMI 24.74 kg/m2  SpO2 98%  Physical Examination:illl appearing.  Lethargic. Pale. Dry mouth. Lungs clear. Abdomen is soft.  Marked muscle atrophy. c annot open right eye.  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  Now with FTT.  Dehydrated.  Will admit.  IV fluids.  Swallow evaluation.  NO CODE Ronna Polio, MD 05/18/2013, 8:59 PM

## 2013-05-18 NOTE — Evaluation (Signed)
Clinical/Bedside Swallow Evaluation Patient Details  Name: Keith Duffy MRN: [id number] Date of Birth: 1946-01-09  Today's Date: 05/18/2013 Time: 1125-1159 SLP Time Calculation (min): 34 min  Past Medical History:  Past Medical History  Diagnosis Date  . Back pain   . Hypertension   . Diabetes mellitus without complication   . Cancer     non hodkins lymphoma   Past Surgical History:  Past Surgical History  Procedure Laterality Date  . Chemo reservior insertion N/A 03/15/2013    Procedure: CHEMO RESERVOIR INSERTION;  Surgeon: Ophelia Charter, MD;  Location: [place] [facility];  Service: Neurosurgery;  Laterality: N/A;  right   HPI:  68 yo male adm to [facility] with weakness and decreased ambulatory ability.  Pt with nausea and decreased appetite.  He has h/o DM, HTN, mouth sores - extending into esophagus s/p Diflucan tx.  Pt CT abdomen 3/8 suspicious for GER as contrast was seen in distal esophagus.  BSE ordered to assess for pharyngeal dysphagia.     Assessment / Plan / Recommendation Clinical Impression  Pt with gross weakness resulting in oral dysphagia and suspected pharyngeal deficits.  White coating on tongue - ? oral candida - s/p Diflucan tx - ? if also present in pharynx exacerbating pt's dysphagia.  Pt will accept HOB elevation to only 20* due to amount of pain.   Oral care provided by SLP using toothette and oral suction (gentle) with pt assistance.  Overt cough that was nonproductive noted with just oral care- suspect due to aspiration.    Pt does not desire nor is he consuming po per his statement.  SLP educated pt/spouse to evaluation findings, oropharyngeal dysphagia from ? candida, neuro changes, gross deconditioning,  lethargy and poor positioning.     Educated spouse to use of oral suctioning, oral swabbing and oral care may be most comforting for this pt.  Spoke to RN who reports possible plan for hospice.   SLP to sign off as all education is completed, please reconsult if  desire.      Aspiration Risk  Severe    Diet Recommendation NPO   Medication Administration: Via alternative means    Other  Recommendations Oral Care Recommendations: Oral care Q4 per protocol   Follow Up Recommendations    n/a   Frequency and Duration   n/a     Pertinent Vitals/Pain Afebrile, decreased      Swallow Study Prior Functional Status   recent h/o dysphagia s/p diflucan tx    General Date of Onset: 05/18/13 HPI: 68 yo male adm to [facility] with weakness and decreased ambulatory ability.  Pt with nausea and decreased appetite.  He has h/o DM, HTN, mouth sores - extending into esophagus s/p Diflucan tx.  Pt CT abdomen 3/8 suspicious for GER as contrast was seen in distal esophagus.  BSE ordered to assess for pharyngeal dysphagia.   Type of Study: Bedside swallow evaluation Diet Prior to this Study: Dysphagia 1 (puree);Thin liquids Temperature Spikes Noted: No Respiratory Status: Room air History of Recent Intubation: No Behavior/Cognition: Lethargic;Decreased sustained attention;Cooperative Oral Cavity - Dentition: Adequate natural dentition Self-Feeding Abilities: Total assist Patient Positioning:  (pt HOB limited to 20* due to pt pain in hips) Baseline Vocal Quality: Hoarse;Low vocal intensity Volitional Cough: Weak Volitional Swallow: Unable to elicit    Oral/Motor/Sensory Function Overall Oral Motor/Sensory Function:  (gross motor weakness noted, pt with decreased ability to follow directions for specific OME, spouse reports pt with bell's palsy a few months ago  impacting left side, now without right eye opening)   Ice Chips Ice chips: Not tested   Thin Liquid Thin Liquid: Impaired Presentation: Spoon Oral Phase Impairments: Reduced lingual movement/coordination;Reduced labial seal;Impaired anterior to posterior transit Oral Phase Functional Implications: Prolonged oral transit Pharyngeal  Phase Impairments: Unable to trigger swallow;Cough - Immediate Other  Comments: oral moisture via toothette and tsp of water for pt to swish and expectorate resulted in suspected prematue spillage ino pharynx, absent swallow and weak nonprotective cough - concerning for airway protection    Nectar Thick Nectar Thick Liquid: Not tested   Honey Thick Honey Thick Liquid: Not tested   Puree Puree: Not tested   Solid   GO    Solid: Not tested       Claudie Fisherman, [facility] SLP [phone]

## 2013-05-18 NOTE — Progress Notes (Signed)
Pt BP 178/100, c/o headache. Dr. Alvy Bimler on call for Dr. Marin Olp was called, orders received. This RN to continue to monitor. Noreene Larsson RN

## 2013-05-18 NOTE — Progress Notes (Signed)
INITIAL NUTRITION ASSESSMENT  DOCUMENTATION CODES Per approved criteria  -Severe malnutrition in the context of chronic illness  Pt meets criteria for severe MALNUTRITION in the context of chronic illness as evidenced by 38% weight loss in one year, PO intake <75% for > one month, severe muscle wasting in extremities.   INTERVENTION: -Recommend SLP evaluation -Recommend Ensure Complete po TID, each supplement provides 350 kcal and 13 grams of protein , as tolerated -Initiation of TF as warranted -Will continue to monitor  NUTRITION DIAGNOSIS: Inadequate oral intake related to difficulty swallowing/decreased appetite as evidenced by PO intake <75%, continued unintentional wt loss.   Goal: Pt to meet >/= 90% of their estimated nutrition needs    Monitor:  Total protein/energy intake, swallow profile, diet order, labs, weights  Reason for Assessment: MST  68 y.o. male  Admitting Dx: <principal problem not specified>  ASSESSMENT: -Pt was d/c'd from [facility] on 3/24 -Since d/c, pt has had minimal intake d/t inability to swallow both liquids and solids -Pt's wife prepares milkshake with Ensure and Frosty, which pt ate 50%. Wife noted that this was largest amount pt ate for past 7 days -Has had 0% PO intake since Sunday (3/29) -Has been to ER/hospitalized several times d/t dehydration -Pt with occasionally hallucinations and large amount of hip pain-requires frequent turning and repositioning that also contribute to pt's poor PO -Is bedridden at home. MD noted pt with significant muscle atrophy on extremities -Dicussed nutrition support with wife. Wife concerned with pt's nutritional deficits, but also worried that pt would pull out any NGT/PEG placement -Pt not candidate for TPN as he is with functioning gut., inadequate energy intake not related to prolonged nausea, and is not having excessive loose stools -Low K, Phos/Mag WNL   Height: Ht Readings from Last 1 Encounters:  05/31/2013  5\' 9"  (1.753 m)    Weight: Wt Readings from Last 1 Encounters:  May 31, 2013 167 lb 9.6 oz (76.023 kg)    Ideal Body Weight: 160 lbs  % Ideal Body Weight: 88%- used 3/19 weight as current weight likely not accurate d/t minimal PO intake  Wt Readings from Last 10 Encounters:  05/31/13 167 lb 9.6 oz (76.023 kg)  05/06/13 140 lb (63.504 kg)  04/14/13 155 lb (70.308 kg)  04/07/13 155 lb (70.308 kg)  03/30/13 154 lb (69.854 kg)  03/18/13 161 lb 13.1 oz (73.4 kg)  03/18/13 161 lb 13.1 oz (73.4 kg)  03/09/13 161 lb (73.029 kg)  03/05/13 168 lb (76.204 kg)  02/08/13 168 lb (76.204 kg)    Usual Body Weight: 210 lbs per previous RD notes  % Usual Body Weight: 67%  BMI:  Body mass index is 24.74 kg/(m^2).  Estimated Nutritional Needs: Kcal: 2000-2200 Protein: 95-105 gram Fluid: >/=2200 ml/daily  Skin: WDL  Diet Order: Dysphagia 1  EDUCATION NEEDS: -No education needs identified at this time   Intake/Output Summary (Last 24 hours) at 05/18/13 1133 Last data filed at 05/18/13 0900  Gross per 24 hour  Intake 1156.67 ml  Output   2000 ml  Net -843.33 ml    Last BM: pta   Labs:   Recent Labs Lab 2013-05-31 1101 05/18/13 0620  NA 137 138  K 4.0 3.1*  CL 103 106  CO2 27 22  BUN 26* 18  CREATININE 0.6 0.47*  CALCIUM 8.7 7.8*  MG  --  1.6  PHOS  --  2.6  GLUCOSE 227* 172*    CBG (last 3)   Recent Labs  05/18/13  0738  GLUCAP 156*    Scheduled Meds: . ampicillin-sulbactam (UNASYN) IV  1.5 g Intravenous Q8H  . dexamethasone (DECADRON) IVPB  40 mg Intravenous Q1200  . [START ON 05/20/2013] fentaNYL  12.5 mcg Transdermal Q72H  . insulin aspart  0-24 Units Subcutaneous 6 times per day  . insulin aspart  5 Units Subcutaneous TID WC  . metoprolol  5 mg Intravenous 4 times per day  . potassium chloride  10 mEq Intravenous Q1 Hr x 5  . tobramycin-dexamethasone  1 application Right Eye TID    Continuous Infusions: . sodium chloride 100 mL/hr at 04/19/2013 1850     Past Medical History  Diagnosis Date  . Back pain   . Hypertension   . Diabetes mellitus without complication   . Cancer     non hodkins lymphoma    Past Surgical History  Procedure Laterality Date  . Chemo reservior insertion N/A 03/15/2013    Procedure: CHEMO RESERVOIR INSERTION;  Surgeon: Ophelia Charter, MD;  Location: [place] [facility];  Service: Neurosurgery;  Laterality: N/A;  right    Trego [place] Clinical Dietitian [phone]

## 2013-05-19 DIAGNOSIS — E291 Testicular hypofunction: Secondary | ICD-10-CM

## 2013-05-19 DIAGNOSIS — J690 Pneumonitis due to inhalation of food and vomit: Secondary | ICD-10-CM

## 2013-05-19 LAB — CBC
HCT: 38.3 % — ABNORMAL LOW (ref 39.0–52.0)
Hemoglobin: 13.6 g/dL (ref 13.0–17.0)
MCH: 31 pg (ref 26.0–34.0)
MCHC: 35.5 g/dL (ref 30.0–36.0)
MCV: 87.2 fL (ref 78.0–100.0)
PLATELETS: 114 10*3/uL — AB (ref 150–400)
RBC: 4.39 MIL/uL (ref 4.22–5.81)
RDW: 16.3 % — AB (ref 11.5–15.5)
WBC: 14 10*3/uL — AB (ref 4.0–10.5)

## 2013-05-19 LAB — GLUCOSE, CAPILLARY
GLUCOSE-CAPILLARY: 168 mg/dL — AB (ref 70–99)
Glucose-Capillary: 135 mg/dL — ABNORMAL HIGH (ref 70–99)
Glucose-Capillary: 146 mg/dL — ABNORMAL HIGH (ref 70–99)
Glucose-Capillary: 151 mg/dL — ABNORMAL HIGH (ref 70–99)
Glucose-Capillary: 157 mg/dL — ABNORMAL HIGH (ref 70–99)
Glucose-Capillary: 158 mg/dL — ABNORMAL HIGH (ref 70–99)
Glucose-Capillary: 167 mg/dL — ABNORMAL HIGH (ref 70–99)

## 2013-05-19 LAB — COMPREHENSIVE METABOLIC PANEL
ALT: 14 U/L (ref 0–53)
AST: 13 U/L (ref 0–37)
Albumin: 2.7 g/dL — ABNORMAL LOW (ref 3.5–5.2)
Alkaline Phosphatase: 103 U/L (ref 39–117)
BILIRUBIN TOTAL: 0.9 mg/dL (ref 0.3–1.2)
BUN: 17 mg/dL (ref 6–23)
CHLORIDE: 96 meq/L (ref 96–112)
CO2: 20 mEq/L (ref 19–32)
CREATININE: 0.42 mg/dL — AB (ref 0.50–1.35)
Calcium: 8.4 mg/dL (ref 8.4–10.5)
GFR calc Af Amer: >90 mL/min (ref >90)
GFR calc non Af Amer: >90 mL/min (ref >90)
Glucose, Bld: 164 mg/dL — ABNORMAL HIGH (ref 70–99)
Potassium: 3.5 mEq/L — ABNORMAL LOW (ref 3.7–5.3)
SODIUM: 132 meq/L — AB (ref 137–147)
Total Protein: 5.4 g/dL — ABNORMAL LOW (ref 6.0–8.3)

## 2013-05-19 LAB — EYE CULTURE

## 2013-05-19 MED ORDER — TESTOSTERONE CYPIONATE 200 MG/ML IM SOLN
400.0000 mg | Freq: Once | INTRAMUSCULAR | Status: AC
  Administered 2013-05-19: 400 mg via INTRAMUSCULAR
  Filled 2013-05-19: qty 2

## 2013-05-19 MED ORDER — FENTANYL 25 MCG/HR TD PT72: 25.0000 ug | MEDICATED_PATCH | TRANSDERMAL | Status: DC

## 2013-05-19 MED ORDER — FENTANYL 25 MCG/HR TD PT72
25.0000 ug | MEDICATED_PATCH | TRANSDERMAL | Status: DC
  Administered 2013-05-19 – 2013-05-22 (×2): 25 ug via TRANSDERMAL
  Filled 2013-05-19 (×2): qty 1

## 2013-05-19 MED ORDER — CLONIDINE HCL 0.3 MG/24HR TD PTWK
0.3000 mg | MEDICATED_PATCH | TRANSDERMAL | Status: DC
  Administered 2013-05-19: 0.3 mg via TRANSDERMAL
  Filled 2013-05-19 (×2): qty 1

## 2013-05-19 MED ORDER — SCOPOLAMINE 1 MG/3DAYS TD PT72
1.0000 | MEDICATED_PATCH | TRANSDERMAL | Status: DC
  Administered 2013-05-19 – 2013-05-22 (×2): 1.5 mg via TRANSDERMAL
  Filled 2013-05-19 (×5): qty 1

## 2013-05-19 NOTE — Progress Notes (Signed)
Keith Duffy is just not doing well. He is aspirating. I am not sure as to why. One really has to think that he has lymphoma him back into his brain. Unfortunately, I don't think that he is able to tolerate any type of scan.  His testosterone level is incredibly low. It is only 19. I will go ahead and give him a dose of testosterone.  He just is not then oriented. He still cannot open his right eye.  His blood pressure has been quite high. I will go ahead and put him on a clonidine patch.  I will increase his fentanyl patch.  I think the whole she is going to be the spinal fluid evaluation. If he has obvious lymphoma in the spinal fluid, I do not see that keeping him going with a G-tube is going to help. I spoke to his wife about this. She agrees.  There is no fever. He's had no bleeding. He still has the Foley catheter in.  His blood pressure this morning is 142/71. His temperature is 96.7. Oral exam shows improved oral mucosa. Lungs are clear. Cardiac exam regular in rhythm. Abdomen soft. Extremities shows marked muscle atrophy in upper and lower extremities. Neurological exam is nonfocal.  His platelet count is 114. White cell count 14. Calcium is 8.4. Liver function tests are okay.  He does want to go home. He just is not ready for this yet. Again, the spinal fluid evaluation is going to be the key.  We are trying to get him as comfortable as possible.  I spoke to his wife today. I spent a good 30 minutes with them.  Colin Broach 9:9

## 2013-05-19 DEATH — deceased

## 2013-05-20 ENCOUNTER — Inpatient Hospital Stay (HOSPITAL_COMMUNITY): Payer: Medicare Other

## 2013-05-20 DIAGNOSIS — D696 Thrombocytopenia, unspecified: Secondary | ICD-10-CM

## 2013-05-20 DIAGNOSIS — R1310 Dysphagia, unspecified: Secondary | ICD-10-CM

## 2013-05-20 LAB — GLUCOSE, CAPILLARY
Glucose-Capillary: 128 mg/dL — ABNORMAL HIGH (ref 70–99)
Glucose-Capillary: 115 mg/dL — ABNORMAL HIGH (ref 70–99)
Glucose-Capillary: 164 mg/dL — ABNORMAL HIGH (ref 70–99)
Glucose-Capillary: 129 mg/dL — ABNORMAL HIGH (ref 70–99)
Glucose-Capillary: 193 mg/dL — ABNORMAL HIGH (ref 70–99)

## 2013-05-20 LAB — COMPREHENSIVE METABOLIC PANEL
ALT: 11 U/L (ref 0–53)
AST: 9 U/L (ref 0–37)
Albumin: 2.2 g/dL — ABNORMAL LOW (ref 3.5–5.2)
Alkaline Phosphatase: 84 U/L (ref 39–117)
BUN: 15 mg/dL (ref 6–23)
CALCIUM: 8.1 mg/dL — AB (ref 8.4–10.5)
CO2: 22 meq/L (ref 19–32)
CREATININE: 0.48 mg/dL — AB (ref 0.50–1.35)
Chloride: 102 mEq/L (ref 96–112)
GLUCOSE: 132 mg/dL — AB (ref 70–99)
Potassium: 3.4 mEq/L — ABNORMAL LOW (ref 3.7–5.3)
Sodium: 136 mEq/L — ABNORMAL LOW (ref 137–147)
Total Bilirubin: 0.7 mg/dL (ref 0.3–1.2)
Total Protein: 4.7 g/dL — ABNORMAL LOW (ref 6.0–8.3)

## 2013-05-20 LAB — CBC
HCT: 32.9 % — ABNORMAL LOW (ref 39.0–52.0)
Hemoglobin: 11.5 g/dL — ABNORMAL LOW (ref 13.0–17.0)
MCH: 30.6 pg (ref 26.0–34.0)
MCHC: 35 g/dL (ref 30.0–36.0)
MCV: 87.5 fL (ref 78.0–100.0)
PLATELETS: 77 10*3/uL — AB (ref 150–400)
RBC: 3.76 MIL/uL — AB (ref 4.22–5.81)
RDW: 16.5 % — ABNORMAL HIGH (ref 11.5–15.5)
WBC: 8.3 10*3/uL (ref 4.0–10.5)

## 2013-05-20 LAB — PROTIME-INR
INR: 1.27 (ref 0.00–1.49)
PROTHROMBIN TIME: 15.6 s — AB (ref 11.6–15.2)

## 2013-05-20 LAB — APTT: APTT: 26 s (ref 24–37)

## 2013-05-20 MED ORDER — IOHEXOL 300 MG/ML  SOLN
25.0000 mL | Freq: Once | INTRAMUSCULAR | Status: AC | PRN
  Administered 2013-05-20: 1 mL

## 2013-05-20 MED ORDER — POTASSIUM CHLORIDE 10 MEQ/100ML IV SOLN
10.0000 meq | INTRAVENOUS | Status: AC
  Administered 2013-05-20 (×4): 10 meq via INTRAVENOUS
  Filled 2013-05-20 (×4): qty 100

## 2013-05-20 MED ORDER — FAT EMULSION 20 % IV EMUL
250.0000 mL | INTRAVENOUS | Status: DC
  Administered 2013-05-20: 250 mL via INTRAVENOUS
  Filled 2013-05-20: qty 250

## 2013-05-20 MED ORDER — FLUCONAZOLE 100MG IVPB
100.0000 mg | INTRAVENOUS | Status: DC
  Administered 2013-05-20 – 2013-05-24 (×5): 100 mg via INTRAVENOUS
  Filled 2013-05-20 (×5): qty 50

## 2013-05-20 MED ORDER — SODIUM CHLORIDE 0.9 % IJ SOLN
10.0000 mL | Freq: Two times a day (BID) | INTRAMUSCULAR | Status: DC
  Administered 2013-05-20 – 2013-05-22 (×4): 10 mL
  Administered 2013-05-22: 20 mL
  Administered 2013-05-23 (×2): 10 mL
  Administered 2013-05-24: 20 mL
  Administered 2013-05-24: 10 mL

## 2013-05-20 MED ORDER — MIDAZOLAM HCL 2 MG/2ML IJ SOLN
INTRAMUSCULAR | Status: AC | PRN
  Administered 2013-05-20 (×2): 1 mg via INTRAVENOUS

## 2013-05-20 MED ORDER — METOPROLOL TARTRATE 1 MG/ML IV SOLN
5.0000 mg | Freq: Two times a day (BID) | INTRAVENOUS | Status: DC
  Administered 2013-05-20 – 2013-05-22 (×4): 5 mg via INTRAVENOUS
  Filled 2013-05-20 (×5): qty 5

## 2013-05-20 MED ORDER — MAGNESIUM SULFATE IN D5W 10-5 MG/ML-% IV SOLN
1.0000 g | Freq: Once | INTRAVENOUS | Status: AC
  Administered 2013-05-20: 1 g via INTRAVENOUS
  Filled 2013-05-20: qty 100

## 2013-05-20 MED ORDER — M.V.I. ADULT IV INJ
INTRAVENOUS | Status: DC
  Filled 2013-05-20: qty 2000

## 2013-05-20 MED ORDER — GLUCAGON HCL (RDNA) 1 MG IJ SOLR
INTRAMUSCULAR | Status: AC
  Filled 2013-05-20: qty 1

## 2013-05-20 MED ORDER — SODIUM CHLORIDE 0.9 % IJ SOLN: 10.0000 mL | INTRAMUSCULAR | Status: DC | PRN

## 2013-05-20 MED ORDER — MIDAZOLAM HCL 2 MG/2ML IJ SOLN
INTRAMUSCULAR | Status: AC
  Filled 2013-05-20: qty 4

## 2013-05-20 MED ORDER — FENTANYL CITRATE 0.05 MG/ML IJ SOLN
INTRAMUSCULAR | Status: AC | PRN
  Administered 2013-05-20 (×2): 50 ug via INTRAVENOUS

## 2013-05-20 MED ORDER — M.V.I. ADULT IV INJ
INTRAVENOUS | Status: DC
  Administered 2013-05-20: 18:00:00 via INTRAVENOUS
  Filled 2013-05-20: qty 1000

## 2013-05-20 MED ORDER — FENTANYL CITRATE 0.05 MG/ML IJ SOLN
INTRAMUSCULAR | Status: AC
  Filled 2013-05-20: qty 4

## 2013-05-20 MED ORDER — FAT EMULSION 20 % IV EMUL
250.0000 mL | INTRAVENOUS | Status: DC
  Filled 2013-05-20: qty 250

## 2013-05-20 NOTE — Progress Notes (Signed)
Peripherally Inserted Central Catheter/Midline Placement  The IV Nurse has discussed with the patient and/or persons authorized to consent for the patient, the purpose of this procedure and the potential benefits and risks involved with this procedure.  The benefits include less needle sticks, lab draws from the catheter and patient may be discharged home with the catheter.  Risks include, but not limited to, infection, bleeding, blood clot (thrombus formation), and puncture of an artery; nerve damage and irregular heat beat.  Alternatives to this procedure were also discussed.  PICC/Midline Placement Documentation  PICC / Midline Double Lumen 82/08/13 PICC Left Basilic 41 cm 0 cm (Active)  Indication for Insertion or Continuance of Line Administration of hyperosmolar/irritating solutions (i.e. TPN, Vancomycin, etc.) 05/20/2013  9:00 AM  Exposed Catheter (cm) 0 cm 05/20/2013  9:00 AM  Dressing Change Due 05/27/13 05/20/2013  9:00 AM       Keith Duffy 05/20/2013, 9:54 AM

## 2013-05-20 NOTE — Progress Notes (Signed)
Patient ID: Keith Duffy, male   DOB: [birth year], 68 y.o.   MRN: [id number] Request received for placement of percutaneous gastrostomy tube in pt with history of CNS relapse of NHL, FTT, dysphagia. Additional hx as below. Imaging studies have been reviewed by Dr. Anselm Pancoast. Exam: pt awake but lethargic;  has rt ptosis and rt facial droop; chest- CTA bilat, clean, intact rt chest wall PAC.; heart- RRR; abd- soft,+BS,NT; ext- sig atrophy, no edema.   Filed Vitals:   05/20/13 0825 05/20/13 0847 05/20/13 0947 05/20/13 1025  BP: 140/60 124/63 160/82 148/65  Pulse: 80 77 80 78  Temp: 97.5 F (36.4 C) 98.3 F (36.8 C) 97.6 F (36.4 C) 97.8 F (36.6 C)  TempSrc: Oral Axillary Axillary Axillary  Resp: 18 20 20 18   Height:      Weight:      SpO2: 99% 99% 99% 99%   Past Medical History  Diagnosis Date  . Back pain   . Hypertension   . Diabetes mellitus without complication   . Cancer     non hodkins lymphoma   Past Surgical History  Procedure Laterality Date  . Chemo reservior insertion N/A 03/15/2013    Procedure: CHEMO RESERVOIR INSERTION;  Surgeon: Ophelia Charter, MD;  Location: [facility];  Service: Neurosurgery;  Laterality: N/A;  right   Ct Abdomen Pelvis W Contrast  04/25/2013   CLINICAL DATA:  Abdominal pain. Nausea and vomiting. Recent chemotherapy for non-Hodgkin's lymphoma. Reduced bowel sounds.  EXAM: CT ABDOMEN AND PELVIS WITH CONTRAST  TECHNIQUE: Multidetector CT imaging of the abdomen and pelvis was performed using the standard protocol following bolus administration of intravenous contrast.  CONTRAST:  40mL OMNIPAQUE IOHEXOL 300 MG/ML SOLN, 113mL OMNIPAQUE IOHEXOL 300 MG/ML SOLN  COMPARISON:  DG ABD PORTABLE 1V dated 04/25/2013; NM PET IMAGE RESTAG (PS) SKULL BASE TO THIGH dated 04/02/2013; US ABDOMEN COMPLETE dated 08/19/2012; MR L SPINE WO/W CM dated 02/24/2013  FINDINGS: Trace bilateral pleural effusions. Contrast medium in the distal esophagus may reflect reflux. Coronary artery  atherosclerotic calcification.  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 Borderline splenomegaly at 410 cc. Pancreas and adrenal glands normal.  Right kidney upper pole cyst. No dilated small bowel. Small amount of pelvic ascites. Appendix normal.  Diffuse large bowel wall thickening favoring pancolitis.  Urinary bladder unremarkable. Abnormal right posterior eccentricity of the prostate gland.  Diffuse marrow heterogeneity is again observed and may reflect infiltrative marrow process. Facet arthropathy causes moderate left foraminal stenosis at L5-S1.  IMPRESSION: 1. Diffuse wall thickening in the colon compatible with pancolitis, probably infectious. 2. Trace bilateral pleural effusions and trace ascites. 3. Borderline splenomegaly. 4. Continued marrow heterogeneity compatible with marrow infiltrative process. 5. Hypodense hepatic lesions favor a hemangiomas. Right right renal cyst. 6. Contrast medium in the distal esophagus, suspicious for a gastroesophageal reflux. 7. Left foraminal stenosis at L5-S1 due to facet arthropathy.   Electronically Signed   By: Sherryl Barters M.D.   On: 04/25/2013 15:59   Dg Chest Port 1 View  04/28/2013   CLINICAL DATA Fever.  Neutropenia.  EXAM PORTABLE CHEST - 1 VIEW  COMPARISON DG CHEST 1V PORT dated 04/25/2013  FINDINGS Heart report in stable position. Mediastinum and hilar structures normal. No focal infiltrates. Mild basilar atelectasis. Heart size normal. No pleural effusion or pneumothorax. No acute osseous abnormality.  IMPRESSION 1. Power port in stable position. 2. Mild bibasilar atelectasis, otherwise negative chest.  SIGNATURE  Electronically Signed   By: Rentchler   On:  04/28/2013 08:16   Dg Chest Port 1 View  04/25/2013   CLINICAL DATA:  Weakness.  EXAM: PORTABLE CHEST - 1 VIEW  COMPARISON:  Chest x-ray 11/27/2012.  FINDINGS: Right internal jugular single-lumen power porta cath with tip terminating in the mid superior vena cava. Lung volumes are low. No  consolidative airspace disease. No pleural effusions. No pneumothorax. No pulmonary nodule or mass noted. Pulmonary vasculature and the cardiomediastinal silhouette are within normal limits.  IMPRESSION: 1. Low lung volumes without radiographic evidence of acute cardiopulmonary disease.   Electronically Signed   By: Vinnie Langton M.D.   On: 04/25/2013 07:22   Dg Abd Portable 1v  04/25/2013   CLINICAL DATA:  Nausea and vomiting. History of non-Hodgkin's lymphoma  EXAM: PORTABLE ABDOMEN - 1 VIEW  COMPARISON:  NM PET IMAGE RESTAG (PS) SKULL BASE TO THIGH dated 04/02/2013  FINDINGS: Patchy sclerotic densities in the pelvis and femoral heads. Findings are similar to the prior examination. There is a small amount of gas and stool in the right colon. Nonobstructive bowel gas pattern.  IMPRESSION: Nonspecific bowel gas pattern.  Patchy sclerotic densities in the femoral heads. Findings are similar to the prior examination and consistent with history of lymphoma.   Electronically Signed   By: Markus Daft M.D.   On: 04/25/2013 08:51  Results for orders placed during the hospital encounter of 05/07/2013  CBC      Result Value Ref Range   WBC 6.0  4.0 - 10.5 K/uL   RBC 3.41 (*) 4.22 - 5.81 MIL/uL   Hemoglobin 10.7 (*) 13.0 - 17.0 g/dL   HCT 30.2 (*) 39.0 - 52.0 %   MCV 88.6  78.0 - 100.0 fL   MCH 31.4  26.0 - 34.0 pg   MCHC 35.4  30.0 - 36.0 g/dL   RDW 16.0 (*) 11.5 - 15.5 %   Platelets 75 (*) 150 - 400 K/uL  COMPREHENSIVE METABOLIC PANEL      Result Value Ref Range   Sodium 138  137 - 147 mEq/L   Potassium 3.1 (*) 3.7 - 5.3 mEq/L   Chloride 106  96 - 112 mEq/L   CO2 22  19 - 32 mEq/L   Glucose, Bld 172 (*) 70 - 99 mg/dL   BUN 18  6 - 23 mg/dL   Creatinine, Ser 0.47 (*) 0.50 - 1.35 mg/dL   Calcium 7.8 (*) 8.4 - 10.5 mg/dL   Total Protein 4.4 (*) 6.0 - 8.3 g/dL   Albumin 2.1 (*) 3.5 - 5.2 g/dL   AST 10  0 - 37 U/L   ALT 12  0 - 53 U/L   Alkaline Phosphatase 81  39 - 117 U/L   Total Bilirubin 0.5  0.3  - 1.2 mg/dL   GFR calc non Af Amer >90  >90 mL/min   GFR calc Af Amer >90  >90 mL/min  MAGNESIUM      Result Value Ref Range   Magnesium 1.6  1.5 - 2.5 mg/dL  PHOSPHORUS      Result Value Ref Range   Phosphorus 2.6  2.3 - 4.6 mg/dL  GLUCOSE, CAPILLARY      Result Value Ref Range   Glucose-Capillary 156 (*) 70 - 99 mg/dL   Comment 1 Documented in Chart     Comment 2 Notify RN    TESTOSTERONE      Result Value Ref Range   Testosterone 19 (*) 300 - 890 ng/dL  TSH  Result Value Ref Range   TSH 0.500  0.350 - 4.500 uIU/mL  GLUCOSE, CAPILLARY      Result Value Ref Range   Glucose-Capillary 153 (*) 70 - 99 mg/dL   Comment 1 Documented in Chart     Comment 2 Notify RN    GLUCOSE, CAPILLARY      Result Value Ref Range   Glucose-Capillary 163 (*) 70 - 99 mg/dL   Comment 1 Documented in Chart     Comment 2 Notify RN    CBC      Result Value Ref Range   WBC 14.0 (*) 4.0 - 10.5 K/uL   RBC 4.39  4.22 - 5.81 MIL/uL   Hemoglobin 13.6  13.0 - 17.0 g/dL   HCT 38.3 (*) 39.0 - 52.0 %   MCV 87.2  78.0 - 100.0 fL   MCH 31.0  26.0 - 34.0 pg   MCHC 35.5  30.0 - 36.0 g/dL   RDW 16.3 (*) 11.5 - 15.5 %   Platelets 114 (*) 150 - 400 K/uL  COMPREHENSIVE METABOLIC PANEL      Result Value Ref Range   Sodium 132 (*) 137 - 147 mEq/L   Potassium 3.5 (*) 3.7 - 5.3 mEq/L   Chloride 96  96 - 112 mEq/L   CO2 20  19 - 32 mEq/L   Glucose, Bld 164 (*) 70 - 99 mg/dL   BUN 17  6 - 23 mg/dL   Creatinine, Ser 0.42 (*) 0.50 - 1.35 mg/dL   Calcium 8.4  8.4 - 10.5 mg/dL   Total Protein 5.4 (*) 6.0 - 8.3 g/dL   Albumin 2.7 (*) 3.5 - 5.2 g/dL   AST 13  0 - 37 U/L   ALT 14  0 - 53 U/L   Alkaline Phosphatase 103  39 - 117 U/L   Total Bilirubin 0.9  0.3 - 1.2 mg/dL   GFR calc non Af Amer >90  >90 mL/min   GFR calc Af Amer >90  >90 mL/min  GLUCOSE, CAPILLARY      Result Value Ref Range   Glucose-Capillary 142 (*) 70 - 99 mg/dL   Comment 1 Notify RN    GLUCOSE, CAPILLARY      Result Value Ref Range    Glucose-Capillary 158 (*) 70 - 99 mg/dL   Comment 1 Notify RN    GLUCOSE, CAPILLARY      Result Value Ref Range   Glucose-Capillary 167 (*) 70 - 99 mg/dL   Comment 1 Notify RN    GLUCOSE, CAPILLARY      Result Value Ref Range   Glucose-Capillary 151 (*) 70 - 99 mg/dL   Comment 1 Documented in Chart     Comment 2 Notify RN    GLUCOSE, CAPILLARY      Result Value Ref Range   Glucose-Capillary 157 (*) 70 - 99 mg/dL   Comment 1 Documented in Chart     Comment 2 Notify RN    GLUCOSE, CAPILLARY      Result Value Ref Range   Glucose-Capillary 168 (*) 70 - 99 mg/dL   Comment 1 Documented in Chart     Comment 2 Notify RN    CBC      Result Value Ref Range   WBC 8.3  4.0 - 10.5 K/uL   RBC 3.76 (*) 4.22 - 5.81 MIL/uL   Hemoglobin 11.5 (*) 13.0 - 17.0 g/dL   HCT 32.9 (*) 39.0 - 52.0 %   MCV 87.5  78.0 - 100.0 fL   MCH 30.6  26.0 - 34.0 pg   MCHC 35.0  30.0 - 36.0 g/dL   RDW 16.5 (*) 11.5 - 15.5 %   Platelets 77 (*) 150 - 400 K/uL  COMPREHENSIVE METABOLIC PANEL      Result Value Ref Range   Sodium 136 (*) 137 - 147 mEq/L   Potassium 3.4 (*) 3.7 - 5.3 mEq/L   Chloride 102  96 - 112 mEq/L   CO2 22  19 - 32 mEq/L   Glucose, Bld 132 (*) 70 - 99 mg/dL   BUN 15  6 - 23 mg/dL   Creatinine, Ser 0.48 (*) 0.50 - 1.35 mg/dL   Calcium 8.1 (*) 8.4 - 10.5 mg/dL   Total Protein 4.7 (*) 6.0 - 8.3 g/dL   Albumin 2.2 (*) 3.5 - 5.2 g/dL   AST 9  0 - 37 U/L   ALT 11  0 - 53 U/L   Alkaline Phosphatase 84  39 - 117 U/L   Total Bilirubin 0.7  0.3 - 1.2 mg/dL   GFR calc non Af Amer >90  >90 mL/min   GFR calc Af Amer >90  >90 mL/min  GLUCOSE, CAPILLARY      Result Value Ref Range   Glucose-Capillary 146 (*) 70 - 99 mg/dL   Comment 1 Notify RN    GLUCOSE, CAPILLARY      Result Value Ref Range   Glucose-Capillary 135 (*) 70 - 99 mg/dL   Comment 1 Notify RN    GLUCOSE, CAPILLARY      Result Value Ref Range   Glucose-Capillary 128 (*) 70 - 99 mg/dL   Comment 1 Notify RN    GLUCOSE, CAPILLARY       Result Value Ref Range   Glucose-Capillary 115 (*) 70 - 99 mg/dL   Comment 1 Documented in Chart     Comment 2 Notify RN    GLUCOSE, CAPILLARY      Result Value Ref Range   Glucose-Capillary 164 (*) 70 - 99 mg/dL   Comment 1 Documented in Chart     Comment 2 Notify RN    PREPARE PLATELET PHERESIS      Result Value Ref Range   Unit Number [id number]     Blood Component Type PLTPHER LR2     Unit division 00     Status of Unit ISSUED     Transfusion Status OK TO TRANSFUSE     A/P: Pt with hx of CNS relapse of NHL, FTT, dysphagia. Tent plan is for placement of a percutaneous gastrostomy tube today. Details/risks of procedure d/w pt's wife with her understanding and consent. Pt has received 1 unit of platelets this am.

## 2013-05-20 NOTE — Progress Notes (Addendum)
PARENTERAL NUTRITION CONSULT NOTE - INITIAL  Pharmacy Consult for TNA Indication: malnutrition in setting of Non-Hodgkin's Lymphoma with abd pain and difficulty swallowing  Allergies  Allergen Reactions  . Aspartame And Phenylalanine Diarrhea  . Codeine Other (See Comments)    "Made me go wild when I was young"    Patient Measurements: Height: 5\' 9"  (175.3 cm) Weight: 167 lb 9.6 oz (76.023 kg) IBW/kg (Calculated) : 70.7 . Vital Signs: Temp: 97.3 F (36.3 C) (04/02 0405) Temp src: Oral (04/02 0405) BP: 149/63 mmHg (04/02 0405) Pulse Rate: 73 (04/02 0405) Intake/Output from previous day: 04/01 0701 - 04/02 0700 In: 2400 [I.V.:2400] Out: 1600 [Urine:1600] Intake/Output from this shift:    Labs:  Recent Labs  05/18/13 0620 05/19/13 0441 05/20/13 0424  WBC 6.0 14.0* 8.3  HGB 10.7* 13.6 11.5*  HCT 30.2* 38.3* 32.9*  PLT 75* 114* 77*     Recent Labs  05/14/2013 1101 05/18/13 0620 05/19/13 0441 05/20/13 0424  NA 137 138 132* 136*  K 4.0 3.1* 3.5* 3.4*  CL 103 106 96 102  CO2 27 22 20 22   GLUCOSE 227* 172* 164* 132*  BUN 26* 18 17 15   CREATININE 0.6 0.47* 0.42* 0.48*  CALCIUM 8.7 7.8* 8.4 8.1*  MG  --  1.6  --   --   PHOS  --  2.6  --   --   PROT 5.2* 4.4* 5.4* 4.7*  ALBUMIN  --  2.1* 2.7* 2.2*  AST 12 10 13 9   ALT 17 12 14 11   ALKPHOS 84 81 103 84  BILITOT 1.00 0.5 0.9 0.7  PREALBUMIN 29.8  --   --   --    Estimated Creatinine Clearance: 92.1 ml/min (by C-G formula based on Cr of 0.48).    Recent Labs  05/19/13 2351 05/20/13 0402 05/20/13 0730  GLUCAP 135* 128* 115*    Medical History: Past Medical History  Diagnosis Date  . Back pain   . Hypertension   . Diabetes mellitus without complication   . Cancer     non hodkins lymphoma    Medications:  Scheduled:  . ampicillin-sulbactam (UNASYN) IV  1.5 g Intravenous Q8H  . cloNIDine  0.3 mg Transdermal Weekly  . dexamethasone (DECADRON) IVPB  40 mg Intravenous Q1200  . fentaNYL  25 mcg  Transdermal Q72H  . fluconazole (DIFLUCAN) IV  100 mg Intravenous Q24H  . insulin aspart  0-24 Units Subcutaneous 6 times per day  . insulin aspart  5 Units Subcutaneous TID WC  . metoprolol  5 mg Intravenous Q12H  . scopolamine  1 patch Transdermal Q72H  . tobramycin-dexamethasone  1 application Right Eye TID   Infusions:  . sodium chloride 100 mL/hr at 05/20/13 0543   PRN: hydrALAZINE, HYDROmorphone (DILAUDID) injection  Insulin Requirements in the past 24 hours:   Patient has h/o insulin-dependent DM.  Novolog sliding scale coverage q4hrs plus 5 unit meal coverage (not eating). Received 10 units last 24 hrs  Also has Lantus 25 units SQ ordered daily at bedtime as PTA but patient has been refusing this medication since admission and it has not been ordered.  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  Malnourished per RD assessment 3/30 and oral intake is reportedly still poor. Pt was briefly on TNA on last admission earlier this month  History of insulin-dependent DM noted.  Achievement of full recommended caloric intake using TNA alone while still maintaining reasonable glucose control will be challenging.     K low:   Replenishment with 40 meq IV total given as 4 runs over 1 hr each    Mg borderline on 3/31 at 1.6 will replenish.   CBGs 132-164 in past 24 hours on sliding scale Novolog.   Anticpate CBGs  will rise further on TNA and additional insulin will be needed.  Plan:  1. Magnesium Sulfate 1 grams IV x 1 today. 2. K+ 40 mEq IV given as 4x1hr infusions 3. Continue CBGs at q4h with Novolog sliding scale coverage. 4. Encourage patient to allow administration of Lantus doses starting tonight after TNA begins. Spoke to wife. See prefers to use novolog sliding scale only for now without adding lantus. This is reasonable for now but will monitor CBGs closely and anticipate increased insulin requirements 5. At 1800 tonight:  -Begin Clinimix-E 5/15 at 40 mL/hr.  (Using lower-carbohydrate formula than E 5/20 to help keep sugars at a reasonable level).  -Add regular insulin 10 units/L of TNA  -Multivitamins in TNA daily  -Trace Elements in TNA MWF only (due to national shortage)  -Begin Fat Emulsion 20% at 10 mL/hr  -Reduce maintenance IVF to 60 mL/hr 6. As tolerated over the next few days, increase Clinimix rate to goal of 80 mL/hr.  Along with fat emulsion, this will deliver 96 grams protein/day, 1843 KCal/day.  -If glucose control is reasonable and PO intake is minimal, can then consider change to Clinimix-E 5/20 at same rate to meet caloric goals as outline by RD. 7. Follow CBGs and adjust insulin in TNA and sliding scale coverage as needed. 8. Full TNA labs tomorrow, then Mondays and Fridays  -Watch K, Mg, Phos.  Anticipate need for repletion as TNA begins and rate advances. 9. Follow up PEG tube placement and ability to use PEG tube.  Await input and guidance from RD. 20. Pharmacy will follow TNA daily.  Thank you for this Consult!  Montel Clock, PharmD Phone: [phone]  05/20/2013  8:26 AM

## 2013-05-20 NOTE — Progress Notes (Signed)
Marland Kitchen NUTRITION FOLLOW UP  Intervention:   -TPN per pharmacy-monitor K, Phos, Mg d/t pt's high risk of refeeding -Strongly recommend initiation of trickle feeds of Osmolite 1.2 s/p PEG placement -Wean TPN pending pt's TF tolerance -Will continue to monitor  Nutrition Dx:   Inadequate oral intake related to difficulty swallowing/decreased appetite as evidenced by PO intake <75%, continued unintentional wt loss.    Goal:   Pt to meet >/= 90% of their estimated nutrition needs    Monitor:   TPN tolerance, TF initiation, GI profile, swallow profile, total protein/energy intake, labs, weights  Assessment:   -Pt was d/c'd from [facility] on 3/24  -Since d/c, pt has had minimal intake d/t inability to swallow both liquids and solids  -Pt's wife prepares milkshake with Ensure and Frosty, which pt ate 50%. Wife noted that this was largest amount pt ate for past 7 days  -Has had 0% PO intake since Sunday (3/29)  -Has been to ER/hospitalized several times d/t dehydration  -Pt with occasionally hallucinations and large amount of hip pain-requires frequent turning and repositioning that also contribute to pt's poor PO  -Is bedridden at home. MD noted pt with significant muscle atrophy on extremities  -Dicussed nutrition support with wife. Wife concerned with pt's nutritional deficits, but also worried that pt would pull out any NGT/PEG placement  -Pt not candidate for TPN as he is with functioning gut., inadequate energy intake not related to prolonged nausea, and is not having excessive loose stools  -Low K, Phos/Mag WNL   4/02: -Received verbal consult from pharmacy regarding initiation of TPN -Pt to receive TPN pending placement of PEG -MD recommended TPN vs NGT placement d/t concern for pt's discomfort, however pt still not ideal candidate for TPN as he has functioning gut. Strongly recommend use of trickle feeds with TF s/p PEG placement, and to wean off TPN as TF tolerated -SLP recommended NPO, pt  is refusing to maintain 20 degree seated position d/t pain. Will monitor for aspiration upon initiation of TF. SLP also noted white coating on pt's tongue -PICC placement today (4/02) -Discussed pt at length with pharmacy. Plan to initiate Clinimix 5/15 at 40 ml/hr and advance as tolerated 80 ml/hr to provide 1843 kcal  (92% est kcal needs) and 96 gram protein (100% est protein needs) -Transition to Clinimix 5/20 as tolerated to more sufficiently meet kcal needs.  -Monitor CBG's d/t hx of DM, will likely increase upon initiation of TPN -Monitor refeeding labs d/t prolonged period of suboptimal nutrition, wt loss   Height: Ht Readings from Last 1 Encounters:  05/16/2013 5\' 9"  (1.753 m)    Weight Status:   Wt Readings from Last 1 Encounters:  05/13/2013 167 lb 9.6 oz (76.023 kg)    Re-estimated needs:  Kcal: 2000-2200  Protein: 95-105 gram  Fluid: >/=2200 ml/daily  Skin: WDL  Diet Order: NPO   Intake/Output Summary (Last 24 hours) at 05/20/13 1159 Last data filed at 05/20/13 0832  Gross per 24 hour  Intake   2676 ml  Output   1400 ml  Net   1276 ml    Last BM: 3/22   Labs:   Recent Labs Lab 05/18/13 0620 05/19/13 0441 05/20/13 0424  NA 138 132* 136*  K 3.1* 3.5* 3.4*  CL 106 96 102  CO2 22 20 22   BUN 18 17 15   CREATININE 0.47* 0.42* 0.48*  CALCIUM 7.8* 8.4 8.1*  MG 1.6  --   --   PHOS 2.6  --   --  GLUCOSE 172* 164* 132*    CBG (last 3)   Recent Labs  05/19/13 2351 05/20/13 0402 05/20/13 0730  GLUCAP 135* 128* 115*    Scheduled Meds: . ampicillin-sulbactam (UNASYN) IV  1.5 g Intravenous Q8H  . cloNIDine  0.3 mg Transdermal Weekly  . dexamethasone (DECADRON) IVPB  40 mg Intravenous Q1200  . fentaNYL  25 mcg Transdermal Q72H  . fluconazole (DIFLUCAN) IV  100 mg Intravenous Q24H  . insulin aspart  0-24 Units Subcutaneous 6 times per day  . insulin aspart  5 Units Subcutaneous TID WC  . metoprolol  5 mg Intravenous Q12H  . potassium chloride  10  mEq Intravenous Q1 Hr x 4  . scopolamine  1 patch Transdermal Q72H  . sodium chloride  10-40 mL Intracatheter Q12H  . tobramycin-dexamethasone  1 application Right Eye TID    Continuous Infusions: . sodium chloride 100 mL/hr at 05/20/13 0543  . Marland KitchenTPN (CLINIMIX-E) Adult     And  . fat emulsion      Atlee Abide MS RD LDN Clinical Dietitian [phone]

## 2013-05-20 NOTE — Progress Notes (Signed)
He he actually looks a little bit better this morning. He does not have any lymphoma in the spinal fluid wave checked last week. As such, we will go ahead and see about putting a feeding tube in the him. I am still not sure as to why he cannot swallow. I worry that there might be some going on even though we do not see it in the spinal fluid. Unfortunately, he just is not able to do any type of scan because he cannot lie flat for a scan.  Hopefully interventional radiology to place a PEG tube. I will give him platelets today to get his platelet count a pool but more.  He got testosterone yesterday. I am unsure has worked or not yet.  He's not hurting. He slept all night.  His labs look okay this morning. Potassium 3.4.  She will need a PICC line in 1 daily TNA.  I expect that we will need TNA briefly until we can use the PEG tube.   I prefer not to put a tube.his nose.  His vital signs look okay. Blood pressure is doing much better at 149/63. He is afebrile. His head and neck exam shows is brought I still cannot open. Left eyes okay. Oral exam still somewhat dry. No obvious mucositis. Lungs are clear. Cardiac exam regular rate rhythm. Abdomen is soft. Bowel sounds are decreased. Extremities shows marked muscle atrophy.  We will continue to try to help him get him stronger a possible. Hopefully, we will be able to do a scan of his brain. I have to believe there is something going on up there and is causing these problems.  As always, his wife is incredibly helpful and she is doing her best.   Laurey Arrow e.  Phillipians 4:13

## 2013-05-20 NOTE — Procedures (Signed)
Placement of percutaneous gastrostomy tube placement.  No immediate complication.

## 2013-05-21 DIAGNOSIS — M6281 Muscle weakness (generalized): Secondary | ICD-10-CM

## 2013-05-21 LAB — CBC
HEMATOCRIT: 29.8 % — AB (ref 39.0–52.0)
HEMOGLOBIN: 10.5 g/dL — AB (ref 13.0–17.0)
MCH: 30.6 pg (ref 26.0–34.0)
MCHC: 35.2 g/dL (ref 30.0–36.0)
MCV: 86.9 fL (ref 78.0–100.0)
Platelets: 106 10*3/uL — ABNORMAL LOW (ref 150–400)
RBC: 3.43 MIL/uL — AB (ref 4.22–5.81)
RDW: 16.1 % — ABNORMAL HIGH (ref 11.5–15.5)
WBC: 9 10*3/uL (ref 4.0–10.5)

## 2013-05-21 LAB — GLUCOSE, CAPILLARY
GLUCOSE-CAPILLARY: 139 mg/dL — AB (ref 70–99)
GLUCOSE-CAPILLARY: 221 mg/dL — AB (ref 70–99)
GLUCOSE-CAPILLARY: 228 mg/dL — AB (ref 70–99)
GLUCOSE-CAPILLARY: 230 mg/dL — AB (ref 70–99)
GLUCOSE-CAPILLARY: 271 mg/dL — AB (ref 70–99)
GLUCOSE-CAPILLARY: 292 mg/dL — AB (ref 70–99)
Glucose-Capillary: 199 mg/dL — ABNORMAL HIGH (ref 70–99)

## 2013-05-21 LAB — COMPREHENSIVE METABOLIC PANEL
ALK PHOS: 80 U/L (ref 39–117)
ALT: 11 U/L (ref 0–53)
AST: 9 U/L (ref 0–37)
Albumin: 2.2 g/dL — ABNORMAL LOW (ref 3.5–5.2)
BUN: 16 mg/dL (ref 6–23)
CHLORIDE: 98 meq/L (ref 96–112)
CO2: 22 meq/L (ref 19–32)
CREATININE: 0.41 mg/dL — AB (ref 0.50–1.35)
Calcium: 8.4 mg/dL (ref 8.4–10.5)
GFR calc Af Amer: >90 mL/min (ref >90)
Glucose, Bld: 233 mg/dL — ABNORMAL HIGH (ref 70–99)
Potassium: 3.4 mEq/L — ABNORMAL LOW (ref 3.7–5.3)
Sodium: 133 mEq/L — ABNORMAL LOW (ref 137–147)
Total Bilirubin: 0.5 mg/dL (ref 0.3–1.2)
Total Protein: 4.9 g/dL — ABNORMAL LOW (ref 6.0–8.3)

## 2013-05-21 LAB — PREPARE PLATELET PHERESIS: Unit division: 0

## 2013-05-21 LAB — DIFFERENTIAL
BASOS PCT: 0 % (ref 0–1)
Basophils Absolute: 0 10*3/uL (ref 0.0–0.1)
Eosinophils Absolute: 0 10*3/uL (ref 0.0–0.7)
Eosinophils Relative: 0 % (ref 0–5)
LYMPHS ABS: 0.4 10*3/uL — AB (ref 0.7–4.0)
Lymphocytes Relative: 4 % — ABNORMAL LOW (ref 12–46)
Monocytes Absolute: 0.4 10*3/uL (ref 0.1–1.0)
Monocytes Relative: 4 % (ref 3–12)
NEUTROS ABS: 8.3 10*3/uL — AB (ref 1.7–7.7)
NEUTROS PCT: 92 % — AB (ref 43–77)

## 2013-05-21 LAB — TRIGLYCERIDES: Triglycerides: 293 mg/dL — ABNORMAL HIGH (ref <150)

## 2013-05-21 LAB — PREALBUMIN: PREALBUMIN: 23.1 mg/dL (ref 17.0–34.0)

## 2013-05-21 LAB — PHOSPHORUS: Phosphorus: 2.1 mg/dL — ABNORMAL LOW (ref 2.3–4.6)

## 2013-05-21 LAB — MAGNESIUM: MAGNESIUM: 1.8 mg/dL (ref 1.5–2.5)

## 2013-05-21 LAB — URINE CULTURE

## 2013-05-21 MED ORDER — DEXAMETHASONE SODIUM PHOSPHATE 4 MG/ML IJ SOLN
12.0000 mg | Freq: Two times a day (BID) | INTRAMUSCULAR | Status: DC
  Administered 2013-05-21 – 2013-05-24 (×8): 12 mg via INTRAVENOUS
  Filled 2013-05-21 (×10): qty 3

## 2013-05-21 MED ORDER — DEXTROSE 5 % IV SOLN
10.0000 mmol | Freq: Once | INTRAVENOUS | Status: AC
  Administered 2013-05-21: 10 mmol via INTRAVENOUS
  Filled 2013-05-21: qty 3.33

## 2013-05-21 MED ORDER — M.V.I. ADULT IV INJ
INTRAVENOUS | Status: DC
  Filled 2013-05-21: qty 1000

## 2013-05-21 MED ORDER — FAT EMULSION 20 % IV EMUL
250.0000 mL | INTRAVENOUS | Status: DC
  Filled 2013-05-21: qty 250

## 2013-05-21 MED ORDER — INSULIN GLARGINE 100 UNIT/ML ~~LOC~~ SOLN
25.0000 [IU] | Freq: Every day | SUBCUTANEOUS | Status: DC
  Administered 2013-05-21 – 2013-05-23 (×3): 25 [IU] via SUBCUTANEOUS
  Filled 2013-05-21 (×4): qty 0.25

## 2013-05-21 MED ORDER — PRO-STAT SUGAR FREE PO LIQD
30.0000 mL | Freq: Two times a day (BID) | ORAL | Status: DC
  Administered 2013-05-21 – 2013-05-24 (×6): 30 mL
  Filled 2013-05-21 (×8): qty 30

## 2013-05-21 MED ORDER — OSMOLITE 1.5 CAL PO LIQD
1000.0000 mL | ORAL | Status: DC
  Administered 2013-05-21 – 2013-05-23 (×3): 1000 mL
  Filled 2013-05-21 (×6): qty 1000

## 2013-05-21 MED ORDER — POTASSIUM CHLORIDE 10 MEQ/100ML IV SOLN
10.0000 meq | INTRAVENOUS | Status: AC
  Administered 2013-05-21 (×4): 10 meq via INTRAVENOUS
  Filled 2013-05-21 (×4): qty 100

## 2013-05-21 NOTE — Progress Notes (Addendum)
NUTRITION FOLLOW UP/CONSULT FOR TF MANAGEMENT  Intervention:   -Initiate Osmolite 1.5 via PEG start at 52m/hr increase by 127mevery 12 hours to goal of 6071mr with Prostat 39m97mD. Goal rate will provide 2360 calories, 110g protein, and 1181ml28me water and met 107% estimated calorie needs and 105% estimated protein needs. If IVF d/c, recommend 200ml 63mr flushes 6 times/day. Advancing TF slowly r/t pt's high refeeding risk.  -Initiate adult enteral protocol -TPN per pharmacy-monitor K, Phos, Mg d/t pt's high risk of refeeding. Recommend replacement of pt's low phosphorus -Will continue to monitor  Nutrition Dx:   Inadequate oral intake related to difficulty swallowing/decreased appetite as evidenced by PO intake <75%, continued unintentional wt loss - ongoing    Goal:   Pt to meet >/= 90% of their estimated nutrition needs - not met    Monitor:   TPN tolerance, TF initiation, GI profile, swallow profile, total protein/energy intake, labs, weights  Assessment:   -Pt was d/c'd from [facility] on 3/24  -Since d/c, pt has had minimal intake d/t inability to swallow both liquids and solids  -Pt's wife prepares milkshake with Ensure and Frosty, which pt ate 50%. Wife noted that this was largest amount pt ate for past 7 days  -Has had 0% PO intake since Sunday (3/29)  -Has been to ER/hospitalized several times d/t dehydration  -Pt with occasionally hallucinations and large amount of hip pain-requires frequent turning and repositioning that also contribute to pt's poor PO  -Is bedridden at home. MD noted pt with significant muscle atrophy on extremities  -Dicussed nutrition support with wife. Wife concerned with pt's nutritional deficits, but also worried that pt would pull out any NGT/PEG placement  -Pt not candidate for TPN as he is with functioning gut., inadequate energy intake not related to prolonged nausea, and is not having excessive loose stools  -Low K, Phos/Mag WNL   4/02: -Received  verbal consult from pharmacy regarding initiation of TPN -Pt to receive TPN pending placement of PEG -MD recommended TPN vs NGT placement d/t concern for pt's discomfort, however pt still not ideal candidate for TPN as he has functioning gut. Strongly recommend use of trickle feeds with TF s/p PEG placement, and to wean off TPN as TF tolerated -SLP recommended NPO, pt is refusing to maintain 20 degree seated position d/t pain. Will monitor for aspiration upon initiation of TF. SLP also noted white coating on pt's tongue -PICC placement today (4/02) -Discussed pt at length with pharmacy. Plan to initiate Clinimix 5/15 at 40 ml/hr and advance as tolerated 80 ml/hr to provide 1843 kcal  (92% est kcal needs) and 96 gram protein (100% est protein needs) -Transition to Clinimix 5/20 as tolerated to more sufficiently meet kcal needs.  -Monitor CBG's d/t hx of DM, will likely increase upon initiation of TPN -Monitor refeeding labs d/t prolonged period of suboptimal nutrition, wt loss  4/3 -PEG tube placed yesterday. Received consult for TF management.  -Suspect pt refeeding as phosphorus level went down as TPN started. Potassium also low.  -TF plan and goals discussed with pharmacist and pt's wife.   Patient is receiving TPN with Clinimix E 5/15 @ 40 ml/hr and 20% fat emulsion  @ 10 ml/hr. Provides 1162 kcal, and 48 grams protein per day. Meets 58% minimum estimated energy needs and 51% minimum estimated protein needs.   Potassium low, getting 4 runs of IV KCl  Phosphorus low Magnesium WNL CBGs elevated  Triglycerides elevated   Height: Ht Readings  from Last 1 Encounters:  04/30/2013 _0  (1.753 m)    Weight Status:   Wt Readings from Last 1 Encounters:  05/03/2013 167 lb 9.6 oz (76.023 kg)    Re-estimated needs:  Kcal: 2000-2200  Protein: 95-105 gram  Fluid: >/=2200 ml/daily  Skin: WDL  Diet Order: NPO   Intake/Output Summary (Last 24 hours) at 05/21/13 0923 Last data filed at  05/21/13 0600  Gross per 24 hour  Intake 2637.17 ml  Output   3000 ml  Net -362.83 ml    Last BM: 3/22   Labs:   Recent Labs Lab 05/18/13 0620 05/19/13 0441 05/20/13 0424 05/21/13 0418  NA 138 132* 136* 133*  K 3.1* 3.5* 3.4* 3.4*  CL 106 96 102 98  CO2 _1 BUN _2 CREATININE 0.47* 0.42* 0.48* 0.41*  CALCIUM 7.8* 8.4 8.1* 8.4  MG 1.6  --   --  1.8  PHOS 2.6  --   --  2.1*  GLUCOSE 172* 164* 132* 233*    CBG (last 3)   Recent Labs  05/21/13 0008 05/21/13 0403 05/21/13 0751  GLUCAP 221* 230* 228*    Scheduled Meds: . ampicillin-sulbactam (UNASYN) IV  1.5 g Intravenous Q8H  . cloNIDine  0.3 mg Transdermal Weekly  . dexamethasone  12 mg Intravenous Q12H  . fentaNYL  25 mcg Transdermal Q72H  . fluconazole (DIFLUCAN) IV  100 mg Intravenous Q24H  . insulin aspart  0-24 Units Subcutaneous 6 times per day  . insulin aspart  5 Units Subcutaneous TID WC  . metoprolol  5 mg Intravenous Q12H  . scopolamine  1 patch Transdermal Q72H  . sodium chloride  10-40 mL Intracatheter Q12H  . tobramycin-dexamethasone  1 application Right Eye TID    Continuous Infusions: . sodium chloride 60 mL/hr at 05/21/13 0740  . Marland KitchenTPN (CLINIMIX-E) Adult 40 mL/hr at 05/20/13 1805   And  . fat emulsion 250 mL (05/20/13 1805)    Mikey College MS, RD, LDN [phone] Pager [phone] After Hours Pager

## 2013-05-21 NOTE — Progress Notes (Signed)
Mr. Webb now has a PEG tube in. I appreciate radiology doing this for [place]. He was evaluated today and this not to be used. We will go ahead and start him on tube feeds.  I do appreciate the dietitian in helping [place] out with respect to 2 feet management. I would deftly started at a low rate and to help minimize issues with reflux.  He still is lethargic. He still has problems with his hip. We cannot get any scans because of issues with pain. Am not sure if some of the pain to be is from immobility and an possible contractures.  He's had no bleeding. His blood sugars have been okay. There's been no shortness of breath. He's had no nausea vomiting.  Again, his spinal fluid was negative for lymphoma.  I really want to get a scan of his brain but he just is not going to be able to handle Procedure.  His wife is in a great job.  His labs looked okay.  His vital signs show temperature 97.4. Pulse 74. Blood pressure 130/78. Head and neck exam shows continued dry oral mucosa. He still has difficulty moving his tongue. He still cannot open his right eye. Lungs are clear. Cardiac exam regular in rhythm with no murmurs rubs or bruits. Abdomen is soft. Has good bowel sounds. There is no fluid wave. There is no palpable liver or spleen tip.Marland Kitchen Extremities shows marked most likely an upper and lower extremities. Skin exam shows some scattered petechia. Neurological exam is nonfocal. He, again, cannot open his right eye.  We will see if the tube feeds can get him any better. If not, then I just am not sure what else we can do to try to help his situation.  His wife is doing all that she can. She understands the difficult nature of his condition. Again I have to suspect that there is lymphoma in the brain that we just are not picking up in the spinal fluid. If we can just do a CT scan of the brain this would help immensely but he is just is in no shape for iconic procedure.  I spent a good half hour with him  today.

## 2013-05-21 NOTE — Progress Notes (Signed)
PARENTERAL NUTRITION CONSULT NOTE - INITIAL  Pharmacy Consult for TNA Indication: malnutrition in setting of Non-Hodgkin's Lymphoma with abd pain and difficulty swallowing  Allergies  Allergen Reactions  . Aspartame And Phenylalanine Diarrhea  . Codeine Other (See Comments)    "Made me go wild when I was young"    Patient Measurements: Height: 5\' 9"  (175.3 cm) Weight: 136 lb 12.8 oz (62.052 kg) IBW/kg (Calculated) : 70.7 . Vital Signs: Temp: 97.1 F (36.2 C) (04/03 0412) Temp src: Axillary (04/03 0412) BP: 147/85 mmHg (04/03 0412) Pulse Rate: 71 (04/03 0412) Intake/Output from previous day: 04/02 0701 - 04/03 0700 In: 2913.2 [I.V.:2021.3; Blood:276; TPN:595.8] Out: 3000 [Urine:3000] Intake/Output from this shift: Total I/O In: -  Out: 100 [Urine:100]  Labs:  Recent Labs  05/19/13 0441 05/20/13 0424 05/20/13 1300 05/21/13 0418  WBC 14.0* 8.3  --  9.0  HGB 13.6 11.5*  --  10.5*  HCT 38.3* 32.9*  --  29.8*  PLT 114* 77*  --  106*  APTT  --   --  26  --   INR  --   --  1.27  --      Recent Labs  05/19/13 0441 05/20/13 0424 05/21/13 0418  NA 132* 136* 133*  K 3.5* 3.4* 3.4*  CL 96 102 98  CO2 20 22 22   GLUCOSE 164* 132* 233*  BUN 17 15 16   CREATININE 0.42* 0.48* 0.41*  CALCIUM 8.4 8.1* 8.4  MG  --   --  1.8  PHOS  --   --  2.1*  PROT 5.4* 4.7* 4.9*  ALBUMIN 2.7* 2.2* 2.2*  AST 13 9 9   ALT 14 11 11   ALKPHOS 103 84 80  BILITOT 0.9 0.7 0.5  PREALBUMIN  --   --  23.1  TRIG  --   --  293*   Estimated Creatinine Clearance: 80.9 ml/min (by C-G formula based on Cr of 0.41).    Recent Labs  05/21/13 0403 05/21/13 0751 05/21/13 1154  GLUCAP 230* 228* 199*    Medical History: Past Medical History  Diagnosis Date  . Back pain   . Hypertension   . Diabetes mellitus without complication   . Cancer     non hodkins lymphoma    Medications:  Scheduled:  . ampicillin-sulbactam (UNASYN) IV  1.5 g Intravenous Q8H  . cloNIDine  0.3 mg Transdermal  Weekly  . dexamethasone  12 mg Intravenous Q12H  . feeding supplement (PRO-STAT SUGAR FREE 64)  30 mL Per Tube BID  . fentaNYL  25 mcg Transdermal Q72H  . fluconazole (DIFLUCAN) IV  100 mg Intravenous Q24H  . insulin aspart  0-24 Units Subcutaneous 6 times per day  . insulin aspart  5 Units Subcutaneous TID WC  . metoprolol  5 mg Intravenous Q12H  . potassium chloride  10 mEq Intravenous Q1 Hr x 4  . scopolamine  1 patch Transdermal Q72H  . sodium chloride  10-40 mL Intracatheter Q12H  . sodium phosphate  Dextrose 5% IVPB  10 mmol Intravenous Once  . tobramycin-dexamethasone  1 application Right Eye TID   Infusions:  . sodium chloride 60 mL/hr at 05/21/13 0740  . Marland KitchenTPN (CLINIMIX-E) Adult 40 mL/hr at 05/20/13 1805   And  . fat emulsion 250 mL (05/20/13 1805)  . feeding supplement (OSMOLITE 1.5 CAL) 1,000 mL (05/21/13 1045)   PRN: hydrALAZINE, HYDROmorphone (DILAUDID) injection, sodium chloride  Insulin Requirements in the past 24 hours:   Patient has h/o insulin-dependent DM. (Home  regimen includes: Lantus 25 mg QHS, and Novolog SSI 0-24 units)  40 units total inuslin since start of TNA (30 units SSI in past 24 hours Custom scale 0-24 units + 10 units Insuline in TNA bag)   Current Nutrition:  Diet: Tube feedings   Maintenance IVF: NS at 60 mL/hr  Nutritional Goals:  (per RD assessment 3/31) 2000 - 2200 KCal/day 95 - 105 grams protein/day Fluid >/= 2200 ml/day  Clinimix rate to goal of 80 mL/hr.  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  Malnourished per RD assessment 3/30 and oral intake is reportedly still poor. Pt was briefly on TNA on last admission earlier this month.  History of insulin-dependent DM noted,  will need close management of insulin.         Plan:   Continue Clinimix E 5/15 at 40 ml/hr, Continue regular insulin 10 units in TNA bag.  Osmolite 1.5 Cal TF started today via PEG tube (Start at 61ml/hr increase by 13ml every 12 hours to goal of 69ml/hr).  Concern for re-feeding syndrome, will replace electrolytes (K and phos) today and repeat labs in AM.  Do not advance TNA today, Start TF with slow advancement per RD.  If patient is able to tolerate tube feedings, consider discontinuing TNA.   Potassium 40 mEq IV given as 4x1hr infusions  Phosphorus 10 mmol IV x 1, infuse over 6 hours  Discontinue novolog 5 units TID with meals as patient is not eating and on TNA (0 units charted since ordered on 3/31).  Start Lantus 25 units QHS as this was patients home regimen and CBGs ate elevated since the start of TNA (193-230).  Continue Custom SSI 0-24 units.  Continue to monitor CBG's and adjust insulin as needed.   Trace elements in TNA on MWF only secondary to national shortage  MVI Daily   Fat Emulsion 20% at 10 ml/hr - Will need to repeat Triglycerides this weekend to ensure not continuing to rise on lipids.   Continue NS at 60 ml/hr   BMET, Mg, Phos in AM 4/4 - Monitor for Re-feeding syndrome   Routine TNA labs on Mondays and Thursdays, Will need to repeat TG   Eean Buss, Gaye Alken PharmD Pager #: [phone] 1:32 PM 05/21/2013

## 2013-05-21 NOTE — Progress Notes (Signed)
Chaplain offered ministry of presence, emotional and spiritual support. Patient asleep. Family member at bedside. Chaplain will follow up as needed or requested.   05/21/13 1400  Clinical Encounter Type  Visited With Patient  Visit Type Initial

## 2013-05-21 NOTE — Progress Notes (Signed)
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  Malnourished per RD assessment 3/30 and oral intake is reportedly still poor. Pt was briefly on TNA on last admission earlier this month.  History of insulin-dependent DM noted, will need close management of insulin.         Noted MD discontinued TNA this afternoon and the patient is started on tube feeds   Plan:   Discontinue TNA consult and labs  Pharmacy will sign-off, please re-consult if further TNA is needed    Thank you for the consult.  Johny Drilling, PharmD, BCPS Pager: [phone] Pharmacy: [phone] 05/21/2013 6:07 PM

## 2013-05-21 NOTE — Progress Notes (Signed)
Subjective: Pt without acute changes; still lethargic; wife in room  Objective: Vital signs in last 24 hours: Temp:  [97.1 F (36.2 C)-97.8 F (36.6 C)] 97.1 F (36.2 C) (04/03 0412) Pulse Rate:  [71-116] 71 (04/03 0412) Resp:  [12-18] 16 (04/03 0412) BP: (146-181)/(59-113) 147/85 mmHg (04/03 0412) SpO2:  [90 %-100 %] 98 % (04/03 0412) Weight:  [136 lb 12.8 oz (62.052 kg)] 136 lb 12.8 oz (62.052 kg) (04/03 1041) Last BM Date: 05/09/13  Intake/Output from previous day: 04/02 0701 - 04/03 0700 In: 2913.2 [I.V.:2021.3; Blood:276; TPN:595.8] Out: 3000 [Urine:3000] Intake/Output this shift: Total I/O In: -  Out: 100 [Urine:100]  G tube intact, insertion site ok, mildly tender, abd soft  Lab Results:   Recent Labs  05/20/13 0424 05/21/13 0418  WBC 8.3 9.0  HGB 11.5* 10.5*  HCT 32.9* 29.8*  PLT 77* 106*   BMET  Recent Labs  05/20/13 0424 05/21/13 0418  NA 136* 133*  K 3.4* 3.4*  CL 102 98  CO2 22 22  GLUCOSE 132* 233*  BUN 15 16  CREATININE 0.48* 0.41*  CALCIUM 8.1* 8.4   PT/INR  Recent Labs  05/20/13 1300  LABPROT 15.6*  INR 1.27   ABG No results found for this basename: PHART, PCO2, PO2, HCO3,  in the last 72 hours  Studies/Results: Ir Gastrostomy Tube Mod Sed  05/20/2013   CLINICAL DATA:  CNS lymphoma and dysphagia. Gastrostomy tube needed for nutrition.  EXAM: PERCUTANEOUS GASTROSTOMY TUBE WITH FLUOROSCOPIC GUIDANCE  Physician: Stephan Minister. Henn, MD  FLUOROSCOPY TIME:  3 min and 30 seconds  MEDICATIONS AND MEDICAL HISTORY: Versed 2.0mg , Fentanyl 100 mcg. A radiology nurse monitored the patient for moderate sedation. The patient is on antibiotics and additional antibiotics were not given.  ANESTHESIA/SEDATION: Moderate sedation time: 20 minutes  PROCEDURE: The procedure was explained to the patient and wife. The risks and benefits of the procedure were discussed and questions were addressed. Informed consent was obtained from the patient's wife. The patient  was placed on the interventional table. A rectal tube was placed and barium was used to opacify the transverse colon. The rectal balloon was deflated. An orogastric tube was placed with fluoroscopic guidance. The anterior abdomen was prepped and draped in sterile fashion. Maximal barrier sterile technique was utilized including caps, mask, sterile gowns, sterile gloves, sterile drape, hand hygiene and skin antiseptic. Stomach was inflated with air through the orogastric tube. The skin and subcutaneous tissues were anesthetized with 1% lidocaine. A 17 gauge needle was directed into the distended stomach with fluoroscopic guidance. A wire was advanced into the stomach. A 9-French vascular sheath was placed and the orogastric tube was snared using a [employer] snare device. The orogastric tube and snare were pulled out of the patient's mouth. The snare device was connected to a 20-French gastrostomy tube. The snare device and gastrostomy tube were pulled through the patient's mouth and out the anterior abdominal wall. The gastrostomy tube was cut to an appropriate length. Contrast injection through gastrostomy tube confirmed placement within the stomach. Rectal tube was removed. Fluoroscopic images were obtained for documentation. The gastrostomy tube was flushed with normal saline.  FINDINGS: Gastrostomy tube within the stomach.  COMPLICATIONS: None  IMPRESSION: Successful fluoroscopic guided percutaneous gastrostomy tube placement.   Electronically Signed   By: Markus Daft M.D.   On: 05/20/2013 16:31    Anti-infectives: Anti-infectives   Start     Dose/Rate Route Frequency Ordered Stop   05/20/13 1400  fluconazole (DIFLUCAN) IVPB  100 mg     100 mg 50 mL/hr over 60 Minutes Intravenous Every 24 hours 05/20/13 0623     05/18/13 1400  fluconazole (DIFLUCAN) IVPB 200 mg  Status:  Discontinued     200 mg 100 mL/hr over 60 Minutes Intravenous Every 24 hours 05/18/13 1313 05/20/13 0623   05/06/2013 2200   ampicillin-sulbactam (UNASYN) 1.5 g in sodium chloride 0.9 % 50 mL IVPB     1.5 g 100 mL/hr over 30 Minutes Intravenous Every 8 hours 05/16/2013 2144        Assessment/Plan: s/p perc G tube 4/2; ok to use tube for feeds; other plans as per primary/onc  LOS: 4 days    Makennah Omura,D [facility] 05/21/2013

## 2013-05-22 DIAGNOSIS — N390 Urinary tract infection, site not specified: Secondary | ICD-10-CM

## 2013-05-22 DIAGNOSIS — B958 Unspecified staphylococcus as the cause of diseases classified elsewhere: Secondary | ICD-10-CM

## 2013-05-22 LAB — GLUCOSE, CAPILLARY
GLUCOSE-CAPILLARY: 154 mg/dL — AB (ref 70–99)
GLUCOSE-CAPILLARY: 282 mg/dL — AB (ref 70–99)
GLUCOSE-CAPILLARY: 289 mg/dL — AB (ref 70–99)
Glucose-Capillary: 173 mg/dL — ABNORMAL HIGH (ref 70–99)
Glucose-Capillary: 226 mg/dL — ABNORMAL HIGH (ref 70–99)
Glucose-Capillary: 226 mg/dL — ABNORMAL HIGH (ref 70–99)

## 2013-05-22 LAB — MAGNESIUM: Magnesium: 1.7 mg/dL (ref 1.5–2.5)

## 2013-05-22 LAB — CBC
HCT: 31.9 % — ABNORMAL LOW (ref 39.0–52.0)
Hemoglobin: 11.4 g/dL — ABNORMAL LOW (ref 13.0–17.0)
MCH: 31.1 pg (ref 26.0–34.0)
MCHC: 35.7 g/dL (ref 30.0–36.0)
MCV: 86.9 fL (ref 78.0–100.0)
PLATELETS: 98 10*3/uL — AB (ref 150–400)
RBC: 3.67 MIL/uL — AB (ref 4.22–5.81)
RDW: 16.4 % — ABNORMAL HIGH (ref 11.5–15.5)
WBC: 8.3 10*3/uL (ref 4.0–10.5)

## 2013-05-22 LAB — COMPREHENSIVE METABOLIC PANEL
ALT: 12 U/L (ref 0–53)
AST: 10 U/L (ref 0–37)
Albumin: 2.3 g/dL — ABNORMAL LOW (ref 3.5–5.2)
Alkaline Phosphatase: 88 U/L (ref 39–117)
BUN: 18 mg/dL (ref 6–23)
CALCIUM: 8.5 mg/dL (ref 8.4–10.5)
CHLORIDE: 98 meq/L (ref 96–112)
CO2: 22 mEq/L (ref 19–32)
Creatinine, Ser: 0.38 mg/dL — ABNORMAL LOW (ref 0.50–1.35)
GFR calc Af Amer: >90 mL/min (ref >90)
Glucose, Bld: 191 mg/dL — ABNORMAL HIGH (ref 70–99)
Potassium: 4 mEq/L (ref 3.7–5.3)
SODIUM: 134 meq/L — AB (ref 137–147)
Total Bilirubin: 0.5 mg/dL (ref 0.3–1.2)
Total Protein: 5.2 g/dL — ABNORMAL LOW (ref 6.0–8.3)

## 2013-05-22 LAB — PHOSPHORUS: Phosphorus: 2.5 mg/dL (ref 2.3–4.6)

## 2013-05-22 MED ORDER — VANCOMYCIN HCL IN DEXTROSE 1-5 GM/200ML-% IV SOLN
1000.0000 mg | Freq: Once | INTRAVENOUS | Status: AC
  Administered 2013-05-22: 1000 mg via INTRAVENOUS
  Filled 2013-05-22: qty 200

## 2013-05-22 MED ORDER — VANCOMYCIN HCL IN DEXTROSE 750-5 MG/150ML-% IV SOLN
750.0000 mg | Freq: Three times a day (TID) | INTRAVENOUS | Status: DC
  Administered 2013-05-22 – 2013-05-24 (×6): 750 mg via INTRAVENOUS
  Filled 2013-05-22 (×7): qty 150

## 2013-05-22 MED ORDER — METOPROLOL TARTRATE 25 MG/10 ML ORAL SUSPENSION
50.0000 mg | Freq: Two times a day (BID) | ORAL | Status: DC
  Administered 2013-05-22 – 2013-05-24 (×4): 50 mg
  Filled 2013-05-22 (×7): qty 20

## 2013-05-22 NOTE — Progress Notes (Signed)
Pharmacy was asked to dose IV metoprolol on 3/31. Metoprolol equivalency between IV and PO doses is difficult to predict. The BP has been relatively controlled, high at times with SBP 132-149 and DBP 60-85. HR has ranged from 70-90. Patient is now tolerating meds via tube.  Plan:  Will switch metoprolol back to enteral route using liquid via tube at same dose as PTA, 50mg  BID.  MD, consider adding hold parameters.   Pharmacy will continue to follow for another 24hrs only.  Romeo Rabon, PharmD, pager [phone]. 05/22/2013,12:17 PM.

## 2013-05-22 NOTE — Progress Notes (Signed)
ANTIBIOTIC CONSULT NOTE - INITIAL  Pharmacy Consult for Vancomycin Indication: UTI  Allergies  Allergen Reactions  . Aspartame And Phenylalanine Diarrhea  . Codeine Other (See Comments)    "Made me go wild when I was young"   Patient Measurements: Height: 5\' 9"  (175.3 cm) Weight: 136 lb 8 oz (61.916 kg) IBW/kg (Calculated) : 70.7  Vital Signs: Temp: 97.7 F (36.5 C) (04/04 0420) Temp src: Axillary (04/04 0420) BP: 132/63 mmHg (04/04 0420) Pulse Rate: 80 (04/04 0420) Intake/Output from previous day: 04/03 0701 - 04/04 0700 In: 1995 [I.V.:1380; NG/GT:105; IV Piggyback:510] Out: 2100 [Urine:2100] Intake/Output from this shift: Total I/O In: -  Out: 600 [Urine:600]  Labs:  Recent Labs  05/20/13 0424 05/21/13 0418 05/22/13 0615  WBC 8.3 9.0 8.3  HGB 11.5* 10.5* 11.4*  PLT 77* 106* 98*  CREATININE 0.48* 0.41* 0.38*   Estimated Creatinine Clearance: 80.6 ml/min (by C-G formula based on Cr of 0.38). No results found for this basename: VANCOTROUGH, VANCOPEAK, VANCORANDOM, GENTTROUGH, GENTPEAK, GENTRANDOM, TOBRATROUGH, TOBRAPEAK, TOBRARND, AMIKACINPEAK, AMIKACINTROU, AMIKACIN,  in the last 72 hours   Microbiology: Recent Results (from the past 720 hour(s))  CULTURE, BLOOD (ROUTINE X 2)     Status: None   Collection Time    04/28/13  8:30 AM      Result Value Ref Range Status   Specimen Description BLOOD RIGHT CHEST PORT   Final   Special Requests     Final   Value: BOTTLES DRAWN AEROBIC AND ANAEROBIC 6CC ANA [place] [facility]   Culture  Setup Time     Final   Value: 04/28/2013 13:21     Performed at [facility]   Culture     Final   Value: NO GROWTH 5 DAYS     Performed at [facility]   Report Status 05/04/2013 FINAL   Final  CULTURE, BLOOD (ROUTINE X 2)     Status: None   Collection Time    04/28/13  8:40 AM      Result Value Ref Range Status   Specimen Description BLOOD LEFT ARM   Final   Special Requests BOTTLES DRAWN AEROBIC AND ANAEROBIC [facility]    Final   Culture  Setup Time     Final   Value: 04/28/2013 10:57     Performed at [facility]   Culture     Final   Value: NO GROWTH 5 DAYS     Performed at [facility]   Report Status 05/04/2013 FINAL   Final  URINE CULTURE     Status: None   Collection Time    04/28/13 11:10 AM      Result Value Ref Range Status   Specimen Description URINE, CATHETERIZED   Final   Special Requests Immunocompromised   Final   Culture  Setup Time     Final   Value: 04/28/2013 14:40     Performed at [place]     Final   Value: NO GROWTH     Performed at [facility]   Culture     Final   Value: NO GROWTH     Performed at [facility]   Report Status 04/29/2013 FINAL   Final  CLOSTRIDIUM DIFFICILE BY PCR     Status: None   Collection Time    05/05/13 12:20 PM      Result Value Ref Range Status   C difficile by pcr NEGATIVE  NEGATIVE Final  Comment: Performed at [place]     Status: None   Collection Time    05/11/13  7:54 AM      Result Value Ref Range Status   Specimen Description URINE, CATHETERIZED   Final   Special Requests NONE   Final   Culture  Setup Time     Final   Value: 05/11/2013 12:21     Performed at [place]     Final   Value: NO GROWTH     Performed at [facility]   Culture     Final   Value: NO GROWTH     Performed at [facility]   Report Status 05/12/2013 FINAL   Final  EYE CULTURE     Status: None   Collection Time    04/27/2013  1:20 PM      Result Value Ref Range Status   Culture, Eye Culture, Eye   Final   Comment: ------------------------------------------------------------------------RIGHT EYEFinal - ===== FINAL REPORT =====Rare STAPHYLOCOCCUS SPECIES (COAGULASE NEGATIVE)  URINE CULTURE     Status: None   Collection Time    05/05/2013  1:24 PM      Result Value Ref Range Status   Urine Culture, Routine Culture, Urine   Final    Comment: Final - ===== COLONY COUNT: =====     75,000 COLONIES/ML     STAPHYLOCOCCUS SPECIES (COAGULASE NEGATIVE)          ------------------------------------------------------------------------     Rifampin and Gentamicin should not be used as     single drugs for treatment of Staph infections.          ------------------------------------------------------------------------      STAPHYLOCOCCUS SPECIES (COAGULASE NEGATIVE)             PENICILLIN                       MIC      Resistant      >=0.5 ug/ml        OXACILLIN                        MIC      Resistant        >=4 ug/ml        CEFAZOLIN                        MIC      Resistant            ug/ml        GENTAMICIN                       MIC      Sensitive          1 ug/ml        CIPROFLOXACIN                    MIC      Resistant        >=8 ug/ml        LEVOFLOXACIN                     MIC      Resistant        >=8 ug/ml        NITROFURANTOIN  MIC      Sensitive         32 ug/ml        TRIMETH/SULFA                    MIC      Resistant        160 ug/ml        VANCOMYCIN                       MIC      Sensitive          2 ug/ml        RIFAMPIN                         MIC      Sensitive      <=0.5 ug/ml        TETRACYCLINE                     MIC      Sensitive          2 ug/ml     END OF REPORT    Medical History: Past Medical History  Diagnosis Date  . Back pain   . Hypertension   . Diabetes mellitus without complication   . Cancer     non hodkins lymphoma    Medications:  Anti-infectives   Start     Dose/Rate Route Frequency Ordered Stop   05/22/13 1800  vancomycin (VANCOCIN) IVPB 750 mg/150 ml premix     750 mg 150 mL/hr over 60 Minutes Intravenous Every 8 hours 05/22/13 0852     05/22/13 1000  vancomycin (VANCOCIN) IVPB 1000 mg/200 mL premix     1,000 mg 200 mL/hr over 60 Minutes Intravenous  Once 05/22/13 0852     05/20/13 1400  fluconazole (DIFLUCAN) IVPB 100 mg     100 mg 50 mL/hr over 60  Minutes Intravenous Every 24 hours 05/20/13 0623     05/18/13 1400  fluconazole (DIFLUCAN) IVPB 200 mg  Status:  Discontinued     200 mg 100 mL/hr over 60 Minutes Intravenous Every 24 hours 05/18/13 1313 05/20/13 0623   05/09/2013 2200  ampicillin-sulbactam (UNASYN) 1.5 g in sodium chloride 0.9 % 50 mL IVPB  Status:  Discontinued     1.5 g 100 mL/hr over 30 Minutes Intravenous Every 8 hours 04/21/2013 2144 05/22/13 4132     Assessment: 68yo M known to pharmacy. MD switching Unasyn to Vanc per pharmacy for resist CoNS in urine. Cannot tolerate PO abx.  3/30 >> Unasyn(MD) >> 4/4 4/2 >> Fluconazole(MD) >> 4/4 >> Vanc >>  Tmax: AF WBCs: wnl Renal: 0.38, 81CG, 94N(using SCr 0.8)  3/30 urine: CoNS sensitive only to gent, nitro, vanc, rifamp, tetra   Goal of Therapy:  Vancomycin trough level 15-20 mcg/ml Appropriate antibiotic dosing for renal function; eradication of infection  Plan:   Vancomycin 1g x 1 then 750mg  IV q8h.  Measure Vanc trough at steady state.  Follow up renal fxn and culture results.  Romeo Rabon, PharmD, pager [phone]. 05/22/2013,8:56 AM.

## 2013-05-22 NOTE — Progress Notes (Signed)
Keith Duffy is really about the same. He has the feeding tube and. His tube feeds are now at 30 cc an hour. We will gradually try to increase this.  He's had no fever. He's had no bleeding. He's had no shortness of breath. There's been no diarrhea. He still has not had a bowel movement.  Still hard for him to move around because of some hip pain.  He's had no change with respect to the right eye. He's had occasional headaches.  On his physical exam, his temperature is 97.7. Pulse is 80. Blood pressure 132/63. His oral exam shows no obvious mucositis. Left pupil reacted properly. Lungs are clear. Cardiac exam regular in rhythm with no murmurs rubs or bruits. Abdomen is soft. There is no palpable liver spleen. Extremities shows marked muscle atrophy in his lower extremities. Skin exam shows some scattered ecchymoses.  On his labs, white cell count 8.3. Hemoglobin 11.4. Platelet count 98,000. Sodium 134. Potassium 4. BUN 18 creatinine 0.38. Albumin is 2.3.  For now, we will try to gradually increase his tube feeds. I think the goal rate is 80 cc an hour.  I still think that there is some ago along with the brain. We just are not going to be able to get him to a scan right now. He just has not been able to lie flat for a scan.  His wife continues to do it until job with him.  I see that his urine culture came back positive for staph. Am not sure an if this is truly significant but given his situation, I don't think that it would hurt [place] to try to treat this. It has multiple resistances. Will see about vancomycin. I will have pharmacy dose this.  Frederich Cha 5:16

## 2013-05-23 DIAGNOSIS — C8581 Other specified types of non-Hodgkin lymphoma, lymph nodes of head, face, and neck: Secondary | ICD-10-CM

## 2013-05-23 LAB — CBC
HCT: 31.8 % — ABNORMAL LOW (ref 39.0–52.0)
Hemoglobin: 11.4 g/dL — ABNORMAL LOW (ref 13.0–17.0)
MCH: 30.9 pg (ref 26.0–34.0)
MCHC: 35.8 g/dL (ref 30.0–36.0)
MCV: 86.2 fL (ref 78.0–100.0)
PLATELETS: 99 10*3/uL — AB (ref 150–400)
RBC: 3.69 MIL/uL — ABNORMAL LOW (ref 4.22–5.81)
RDW: 16.2 % — ABNORMAL HIGH (ref 11.5–15.5)
WBC: 12.4 10*3/uL — ABNORMAL HIGH (ref 4.0–10.5)

## 2013-05-23 LAB — COMPREHENSIVE METABOLIC PANEL
ALT: 14 U/L (ref 0–53)
AST: 11 U/L (ref 0–37)
Albumin: 2.2 g/dL — ABNORMAL LOW (ref 3.5–5.2)
Alkaline Phosphatase: 95 U/L (ref 39–117)
BUN: 18 mg/dL (ref 6–23)
CALCIUM: 8.1 mg/dL — AB (ref 8.4–10.5)
CO2: 22 mEq/L (ref 19–32)
Chloride: 96 mEq/L (ref 96–112)
Creatinine, Ser: 0.34 mg/dL — ABNORMAL LOW (ref 0.50–1.35)
GFR calc Af Amer: >90 mL/min (ref >90)
GFR calc non Af Amer: >90 mL/min (ref >90)
Glucose, Bld: 292 mg/dL — ABNORMAL HIGH (ref 70–99)
Potassium: 3.5 mEq/L — ABNORMAL LOW (ref 3.7–5.3)
SODIUM: 132 meq/L — AB (ref 137–147)
TOTAL PROTEIN: 4.7 g/dL — AB (ref 6.0–8.3)
Total Bilirubin: 0.4 mg/dL (ref 0.3–1.2)

## 2013-05-23 LAB — GLUCOSE, CAPILLARY
GLUCOSE-CAPILLARY: 272 mg/dL — AB (ref 70–99)
Glucose-Capillary: 242 mg/dL — ABNORMAL HIGH (ref 70–99)
Glucose-Capillary: 222 mg/dL — ABNORMAL HIGH (ref 70–99)
Glucose-Capillary: 215 mg/dL — ABNORMAL HIGH (ref 70–99)
Glucose-Capillary: 269 mg/dL — ABNORMAL HIGH (ref 70–99)

## 2013-05-23 LAB — CULTURE, GROUP A STREP

## 2013-05-23 MED ORDER — MORPHINE SULFATE 2 MG/ML IJ SOLN: 2.0000 mg | Freq: Every evening | INTRAMUSCULAR | Status: DC | PRN

## 2013-05-23 MED ORDER — POTASSIUM CHLORIDE 10 MEQ/100ML IV SOLN
10.0000 meq | INTRAVENOUS | Status: AC
Start: 2013-05-23 — End: 2013-05-23
  Administered 2013-05-23 (×2): 10 meq via INTRAVENOUS
  Filled 2013-05-23 (×2): qty 100

## 2013-05-23 NOTE — Progress Notes (Signed)
Keith Duffy   DOB:11/03/1945   LO#:[id number]   JOA#:[id number]  Subjective: patient tells me he is "fine." Wife tells me last night was "terrible"-- he kept choking in sputum and she was up at all hours suctioning him. "No BM in two weeks." Difficult to ascertain from pain how much and where he is hurting.   Objective: middle aged White male examined in bed Filed Vitals:   05/23/13 0657  BP: 138/69  Pulse: 73  Temp: 97.8 F (36.6 C)  Resp: 16    Body mass index is 20.97 kg/(m^2).  Intake/Output Summary (Last 24 hours) at 05/23/13 0827 Last data filed at 05/23/13 6010  Gross per 24 hour  Intake   2584 ml  Output   2175 ml  Net    409 ml     Right eye closed  Oropharynx dry, no obvious thrush  Lungs no rales or rhonchi  Heart regular rate and rhythm  Abdomen soft, +BS,   Neuro follows one step commands, minimally comprehensible attempts at speech   CBG (last 3)   Recent Labs  05/22/13 2347 05/23/13 0400 05/23/13 0723  GLUCAP 154* 242* 272*     Labs:  Lab Results  Component Value Date   WBC 12.4* 05/23/2013   HGB 11.4* 05/23/2013   HCT 31.8* 05/23/2013   MCV 86.2 05/23/2013   PLT 99* 05/23/2013   NEUTROABS 8.3* 05/21/2013    @LASTCHEMISTRY @  Urine Studies No results found for this basename: UACOL, UAPR, USPG, UPH, UTP, UGL, UKET, UBIL, UHGB, UNIT, UROB, ULEU, UEPI, UWBC, URBC, UBAC, CAST, CRYS, UCOM, BILUA,  in the last 72 hours  Basic Metabolic Panel:  Recent Labs Lab 05/18/13 0620 05/19/13 0441 05/20/13 0424 05/21/13 0418 05/22/13 0615 05/23/13 0520  NA 138 132* 136* 133* 134* 132*  K 3.1* 3.5* 3.4* 3.4* 4.0 3.5*  CL 106 96 102 98 98 96  CO2 22 20 22 22 22 22   GLUCOSE 172* 164* 132* 233* 191* 292*  BUN 18 17 15 16 18 18   CREATININE 0.47* 0.42* 0.48* 0.41* 0.38* 0.34*  CALCIUM 7.8* 8.4 8.1* 8.4 8.5 8.1*  MG 1.6  --   --  1.8 1.7  --   PHOS 2.6  --   --  2.1* 2.5  --    GFR Estimated Creatinine Clearance: 84 ml/min (by C-G formula based on Cr of  0.34). Liver Function Tests:  Recent Labs Lab 05/19/13 0441 05/20/13 0424 05/21/13 0418 05/22/13 0615 05/23/13 0520  AST 13 9 9 10 11   ALT 14 11 11 12 14   ALKPHOS 103 84 80 88 95  BILITOT 0.9 0.7 0.5 0.5 0.4  PROT 5.4* 4.7* 4.9* 5.2* 4.7*  ALBUMIN 2.7* 2.2* 2.2* 2.3* 2.2*   No results found for this basename: LIPASE, AMYLASE,  in the last 168 hours No results found for this basename: AMMONIA,  in the last 168 hours Coagulation profile  Recent Labs Lab 05/20/13 1300  INR 1.27    CBC:  Recent Labs Lab 04/19/2013 1101  05/19/13 0441 05/20/13 0424 05/21/13 0418 05/22/13 0615 05/23/13 0520  WBC 6.5  < > 14.0* 8.3 9.0 8.3 12.4*  NEUTROABS 6.2  --   --   --  8.3*  --   --   HGB 12.5*  < > 13.6 11.5* 10.5* 11.4* 11.4*  HCT 36.0*  < > 38.3* 32.9* 29.8* 31.9* 31.8*  MCV 90  < > 87.2 87.5 86.9 86.9 86.2  PLT 62*  < > 114* 77* 106*  98* 99*  < > = values in this interval not displayed. Cardiac Enzymes: No results found for this basename: CKTOTAL, CKMB, CKMBINDEX, TROPONINI,  in the last 168 hours BNP: No components found with this basename: POCBNP,  CBG:  Recent Labs Lab 05/22/13 1604 05/22/13 1949 05/22/13 2347 05/23/13 0400 05/23/13 0723  GLUCAP 289* 226* 154* 242* 272*   D-Dimer No results found for this basename: DDIMER,  in the last 72 hours Hgb A1c No results found for this basename: HGBA1C,  in the last 72 hours Lipid Profile  Recent Labs  05/21/13 0418  TRIG 293*   Thyroid function studies No results found for this basename: TSH, T4TOTAL, FREET3, T3FREE, THYROIDAB,  in the last 72 hours Anemia work up No results found for this basename: VITAMINB12, FOLATE, FERRITIN, TIBC, IRON, RETICCTPCT,  in the last 72 hours Microbiology Recent Results (from the past 240 hour(s))  EYE CULTURE     Status: None   Collection Time    05/02/2013  1:20 PM      Result Value Ref Range Status   Culture, Eye Culture, Eye   Final   Comment:  ------------------------------------------------------------------------RIGHT EYEFinal - ===== FINAL REPORT =====Rare STAPHYLOCOCCUS SPECIES (COAGULASE NEGATIVE)  URINE CULTURE     Status: None   Collection Time    05/05/2013  1:24 PM      Result Value Ref Range Status   Urine Culture, Routine Culture, Urine   Final   Comment: Final - ===== COLONY COUNT: =====     75,000 COLONIES/ML     STAPHYLOCOCCUS SPECIES (COAGULASE NEGATIVE)          ------------------------------------------------------------------------     Rifampin and Gentamicin should not be used as     single drugs for treatment of Staph infections.          ------------------------------------------------------------------------      STAPHYLOCOCCUS SPECIES (COAGULASE NEGATIVE)             PENICILLIN                       MIC      Resistant      >=0.5 ug/ml        OXACILLIN                        MIC      Resistant        >=4 ug/ml        CEFAZOLIN                        MIC      Resistant            ug/ml        GENTAMICIN                       MIC      Sensitive          1 ug/ml        CIPROFLOXACIN                    MIC      Resistant        >=8 ug/ml        LEVOFLOXACIN                     MIC      Resistant        >=  8 ug/ml        NITROFURANTOIN                   MIC      Sensitive         32 ug/ml        TRIMETH/SULFA                    MIC      Resistant        160 ug/ml        VANCOMYCIN                       MIC      Sensitive          2 ug/ml        RIFAMPIN                         MIC      Sensitive      <=0.5 ug/ml        TETRACYCLINE                     MIC      Sensitive          2 ug/ml     END OF REPORT      Studies:  No results found.  Assessment: 68 y.o. Keith Duffy man with recurrent CNS NHL, last treatment with high-dose MTX 04/15/2013, planning for d/c to home with Hospice support, DNR in place  Plan: very unfortunate situation. Wife tells me the patient "really wants to go home." I would recommend  drastically simplifying this patient's care so he can accomplish that goal. He is currently on multiple parenteral medications and a complex feeding plan which is throwing off his CBGs. Perhaps all we are really doing is prolonging his dying process and preventing him from accomplishing what he really wants to do. A palliative care consult can help the patient and family clarify their goals and develop a treatment plan that can be followed by the family at home. Dr Marin Olp to discuss further with family  Will add Qhs narcotics and replace K+  MAGRINAT,GUSTAV C, MD 05/23/2013  8:27 AM

## 2013-05-24 ENCOUNTER — Inpatient Hospital Stay (HOSPITAL_COMMUNITY): Payer: Medicare Other

## 2013-05-24 DIAGNOSIS — F411 Generalized anxiety disorder: Secondary | ICD-10-CM

## 2013-05-24 LAB — GLUCOSE, CAPILLARY
GLUCOSE-CAPILLARY: 137 mg/dL — AB (ref 70–99)
GLUCOSE-CAPILLARY: 192 mg/dL — AB (ref 70–99)
Glucose-Capillary: 135 mg/dL — ABNORMAL HIGH (ref 70–99)
Glucose-Capillary: 275 mg/dL — ABNORMAL HIGH (ref 70–99)
Glucose-Capillary: 141 mg/dL — ABNORMAL HIGH (ref 70–99)
Glucose-Capillary: 79 mg/dL (ref 70–99)

## 2013-05-24 LAB — COMPREHENSIVE METABOLIC PANEL
ALK PHOS: 106 U/L (ref 39–117)
ALT: 19 U/L (ref 0–53)
AST: 13 U/L (ref 0–37)
Albumin: 1.9 g/dL — ABNORMAL LOW (ref 3.5–5.2)
BILIRUBIN TOTAL: 0.5 mg/dL (ref 0.3–1.2)
BUN: 14 mg/dL (ref 6–23)
CO2: 25 meq/L (ref 19–32)
CREATININE: 0.32 mg/dL — AB (ref 0.50–1.35)
Calcium: 8 mg/dL — ABNORMAL LOW (ref 8.4–10.5)
Chloride: 99 mEq/L (ref 96–112)
GFR calc Af Amer: >90 mL/min (ref >90)
GFR calc non Af Amer: >90 mL/min (ref >90)
Glucose, Bld: 137 mg/dL — ABNORMAL HIGH (ref 70–99)
POTASSIUM: 3.8 meq/L (ref 3.7–5.3)
Sodium: 134 mEq/L — ABNORMAL LOW (ref 137–147)
Total Protein: 4.6 g/dL — ABNORMAL LOW (ref 6.0–8.3)

## 2013-05-24 LAB — CBC
HCT: 30.5 % — ABNORMAL LOW (ref 39.0–52.0)
Hemoglobin: 10.5 g/dL — ABNORMAL LOW (ref 13.0–17.0)
MCH: 30.4 pg (ref 26.0–34.0)
MCHC: 34.4 g/dL (ref 30.0–36.0)
MCV: 88.4 fL (ref 78.0–100.0)
PLATELETS: 64 10*3/uL — AB (ref 150–400)
RBC: 3.45 MIL/uL — AB (ref 4.22–5.81)
RDW: 16.8 % — ABNORMAL HIGH (ref 11.5–15.5)
WBC: 8.7 10*3/uL (ref 4.0–10.5)

## 2013-05-24 MED ORDER — SODIUM CHLORIDE 0.9 % IV SOLN
1.0000 mg/h | INTRAVENOUS | Status: DC
  Administered 2013-05-24: 1 mg/h via INTRAVENOUS
  Filled 2013-05-24: qty 10

## 2013-05-24 MED ORDER — METOCLOPRAMIDE HCL 5 MG/ML IJ SOLN
10.0000 mg | Freq: Four times a day (QID) | INTRAMUSCULAR | Status: DC
  Administered 2013-05-24 (×3): 10 mg via INTRAVENOUS
  Filled 2013-05-24 (×3): qty 2

## 2013-05-24 MED ORDER — LORAZEPAM 2 MG/ML IJ SOLN
1.0000 mg | INTRAMUSCULAR | Status: DC | PRN
  Administered 2013-05-24 (×2): 1 mg via INTRAVENOUS
  Filled 2013-05-24 (×2): qty 1

## 2013-05-24 MED ORDER — VANCOMYCIN HCL IN DEXTROSE 1-5 GM/200ML-% IV SOLN
1000.0000 mg | Freq: Two times a day (BID) | INTRAVENOUS | Status: DC
  Filled 2013-05-24 (×2): qty 200

## 2013-05-25 NOTE — Progress Notes (Signed)
Wasted 75mL morphine with sarah padden RN.

## 2013-05-25 NOTE — Care Management Note (Signed)
    Page 1 of 2   05/25/2013     11:54:37 AM   CARE MANAGEMENT NOTE 05/25/2013  Patient:  [facility]   Account Number:  [id number]  Date Initiated:  05/18/2013  Documentation initiated by:  DAVIS,TYMEEKA  Subjective/Objective Assessment:   68 yo male admitted with Non Hodgkins Lymphoma.PCP: Mateo Flow, MD. Oncologist: Dr. Marin Olp.     Action/Plan:   Home when stable   Anticipated DC Date:  06/17/13   Anticipated DC Plan:  EXPIRED      DC Planning Services  CM consult      Choice offered to / List presented to:  C-3 Spouse   DME arranged  NA      DME agency  NA     [place] arranged  NA      [place] agency  NA   Status of service:  Completed, signed off Medicare Important Message given?  NA - LOS <3 / Initial given by admissions (If response is "NO", the following Medicare IM given date fields will be blank) Date Medicare IM given:   Date Additional Medicare IM given:    Discharge Disposition:  EXPIRED  Per UR Regulation:  Reviewed for med. necessity/level of care/duration of stay  If discussed at [place] of Stay Meetings, dates discussed:    Comments:  Mechele Claude, RN Registered Nurse Signed CASE MANAGEMENT Care Management Note Service date: 05/18/2013 11:00 AM Cm spoke with patient and spouse at the bedside concerning Cm consult to offer home hospice choice and medication assistance. Per pt's spouse dissatified with care provided by current home health agency. Pt provided with list of home hospice agencies in [place]. Cm informed patient's spouse Home Hospice focus on QOL and comfort measures. Patient's spouse informed Oncologist can continue to follow patient at home with hospice. Cm offered information concerning private duty care agencies for supplemental care providers. Will continue to follow.

## 2013-05-25 NOTE — Progress Notes (Addendum)
Informed by family that patient has passed.Time of death 06/13/51 -2013/06/15, Death confirmed by myself and Demetrius Revel RN. On-call oncologist Alvy Bimler) notified, Organ services notified. Documentation completed, awaiting funeral home for body transport.

## 2013-06-06 NOTE — Discharge Summary (Signed)
#   Death Summary  [phone]  [name].

## 2013-06-07 NOTE — Discharge Summary (Signed)
NAME:  Keith Duffy, SPLITT NO.:  [id number]  MEDICAL RECORD NO.:  [id number]  LOCATION:  4259                         FACILITY:  [facility]  PHYSICIAN:  Keith Duffy, M.D.  DATE OF BIRTH:  1945-05-10  DATE OF ADMISSION:  05/10/2013 DATE OF DISCHARGE:  06-13-2013                              DISCHARGE SUMMARY   DIAGNOSES UPON DEATH: 1. 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 2. Right cranial nerve VII palsy. 3. 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 4. Pancytopenia secondary to chemotherapy. 5. Insertion of feeding tube. 6. Staph species infection of the right eye. 7. Staph species urinary tract infection. 8. Thrombocytopenia secondary to chemotherapy. 9. Insulin-dependent diabetes.  HOSPITAL COURSE:  Keith Duffy is a very nice 68 year old gentleman.  He presented with large cell lymphoma back in July 2014.  He was treated with systemic chemotherapy.  He got into remission.  He then began to develop neurological issues.  He was subsequently found to have CNS involvement by lymphoma.  His spinal fluid was positive for lymphoma. He had an Ommaya reservoir placed couple of months ago.  He received some high-dose chemotherapy with methotrexate.  He had 2 cycles.  He tolerated these very very poorly.  Even with dose reduction, he really had a hard time.  He had intrathecal chemotherapy through the Ommaya reservoir with deficit.  He had been admitted recently.  He had marked pancytopenia.  He improved very nicely despite a relatively prolonged hospital stay.  He was discharged on the 23rd.  He was looking good.  He is out of bed.  He was eating and swallowing.  It seemed like 2 days later, his decline began.  He had urinary retention at home.  He had to have a Foley catheter placed.  He was not able to swallow.  He is weakening.  His wife as hard as she was trying, just could not take care of him.  We went ahead and  brought him to the office.  He obviously was in no condition to go home.  I felt he had to be admitted.  As such, he was admitted.  He was a do not resuscitate on admission.  We really cannot do any scans because he just cannot tolerate lying down flat.  He is having severe left hip pain.  He was on a fentanyl patch. He is getting breakthrough pain medications as indicated.  We had a hard time trying to get him to eat.  He is having swallowing difficulties. He was seen by Speech Pathology.  They felt that he was aspirating. They did not feel that he would be able to eat regular food without aspirating.  As such, we started some TNA on him.  I thought that in order to give him the best chance, we would have to consider a PEG tube. Radiology pulling in force on April 2.  This went well without difficulties.  We started him on tube feeds.  Unfortunately, it was apparent that he was not going to tolerate these.  His stomach was not emptying the tube feeds.  I had him on Reglan.  It appeared that he may have  had some aspiration of the tube feeds.  We had to stop the tube feeds.  I still was worried that his lymphoma was progressed in the brain.  We had done a spinal fluid analysis on March 30.  The cytology came back negative.  However, I just felt that he had a brain disease. We just could not do any scans on him because of his overall poor condition and inability to lie down flat for any kind of scan.  We did do a chest x-ray on him on April 6.  He began to develop some consolidation in the left mid lower lung.  This, I thought, was probably from aspiration.  He was trying his best.  He has significant muscle atrophy.  This really prevented him from even getting out of bed.  He was having some muscle contractures from disk use.  We were giving him pain medication on a regular basis to try to maintain comfort for him.  He was found to have the staph in his eye, staph in his urine.   We did have him on TobraDex eye drops.  This helped.  We did give him some vancomycin for the staph in his urine.  Thankfully, he did not have any bleeding while in the hospital.  This was a blessing in some ways.  I think we did transfuse him while in the hospital.  He was becoming more thrombocytopenic.  On April 3, his platelet count was 106,000.  On 6, platelet count was 164,000.  His hemoglobin was 10.  White cell count was 8.7.  His kidney function was maintained.  His albumin had been dropping.  We monitored his blood sugars.  We did find he was markedly hypotestosteronemic.  His testosterone on the 31st was only 19.  I did give him a testosterone booster with Depo- Testosterone with a dose of 400 mg.  I felt this was helpful now. Unfortunately, it really did not help him out.  He continued to decline despite our interventions.  His wife, was incredibly helpful, really was doing her best.  She tried everything that she could think up to help him.  Unfortunately, his decline just continued gradually but unrelentingly.  We stopped the tube feeds.  I think these were stopped on the 6th. Again, I had him on some Reglan to try to help his stomach empty.  He just continued to decline.  I typically saw him twice each day.  When I saw him in the evening of the 7th, I felt that he probably had less than a week.  I just did not think he would pass away that evening.  He had a morphine drip going.  I though this would be the best way to try to help with pain issues.  He is on a very low dose.  This did seem to be helpful for his pain.  Unfortunately, he passed away peacefully at 11:53 p.m.  His wife and family was with him.  Keith Duffy certainly tried as hard as he could try.  Even though we could not find out any evidence of lymphoma in the spinal fluid, I really felt that his decline was from the CNS involvement of lymphoma. Again, we could not do any scans on him because he  just was not able to lie down flat and I did not want to cause him any further aggravation.  I spoke to his wife the next day.  She was grateful that he passed peacefully  and was no longer suffering.  She is very appreciative of all the great care that he received on the floor on [address].     Keith Duffy, M.D.     PRE/MEDQ  D:  06/06/2013  T:  06/06/2013  Job:  [id number]

## 2013-06-18 NOTE — Progress Notes (Signed)
He had a pretty that weekend., Sure exactly what was going on" with him having "choking". I do not think that he be aspirating. I will try Reglan to try to minimize any gastric retention.  So far, we are making very little headway with him. He still is incredibly weak.  Going to have to try the to do a CT of his brain. I still have to believe that there is something going on up there that is causing all this.  His not bleeding. It's hard to tell if he is hurting.  I will also give him some Ativan to try to help with any kind of anxiety.  His wife is trying to do her best with him.  She said that she got a call about blood cultures being positive when he was in the emergency room data [facility] last week. She thinks that they told her there was "staph". He is on vancomycin right now for staph in his urine.  His blood pressure continues to stabilize 1.0 or 71. He is afebrile.  His labs don't look too bad. Platelet count still low at 64. His kidney function looks okay. Albumin is 1.9.  We will give him another couple days with the tube feeds. If we do not see any type of improvement, then I just don't see that tube feeds are going to really help him.  I been talking to his wife regarding this. She understands blood we may have to do but just wants to make sure that he is being given the opportunity to try to get better.  I really really appreciate the help and the care from the staff on 3 E.   Keith E.  1 Thessalonians 5:16-18

## 2013-06-18 NOTE — Progress Notes (Signed)
Pt wife requested Q4 CBG not done, order canceled due to verbal authorization of on call physician-Dr. Alvy Bimler

## 2013-06-18 NOTE — Progress Notes (Signed)
Pt. Wife called RN into room wife stated that pt. Was having trouble catching his breath and he was feeling nauseated and in pain. Gastric residuals measured at 275cc's   O2 saturations were 95%.  Pt. Was suctioned and a small amount of yellow secretions were removed.  Per wife's request MD was notified about stopping tube feedings.  MD stated that tube feedings should be stopped.  Tube feedings were turned off.  Will continue to monitor.

## 2013-06-18 NOTE — Progress Notes (Signed)
ANTIBIOTIC CONSULT NOTE - Follow up  Pharmacy Consult for Vancomycin Indication: UTI  Allergies  Allergen Reactions  . Aspartame And Phenylalanine Diarrhea  . Codeine Other (See Comments)    "Made me go wild when I was young"   Patient Measurements: Height: 5\' 9"  (175.3 cm) Weight: 141 lb (63.957 kg) IBW/kg (Calculated) : 70.7  Vital Signs: Temp: 97 F (36.1 C) (04/06 0631) Temp src: Axillary (04/06 0631) BP: 145/71 mmHg (04/06 0631) Pulse Rate: 96 (04/06 0841)  Assessment: 68yo M known to pharmacy admitted 3/31 with weakness and decreased PO intake. Patient originally on Unasyn for Staph epidermidis UTI, switched to vancomycin due to sensitivities. Pharmacy is dosing vancomycin.  Antiinfectives 3/30 >> Unasyn(MD) >> 4/4 4/2 >> Fluconazole(MD) >> 4/4 >> Vanc >>  Labs/ vitals Tmax: remains afebrile WBCs: improved to WNL Renal: remains low at 0.32, CrCl 84 ml/min CG, 94 ml/min normlaized (using SCr 0.8)  Microbiology 3/30 urine: 75k CoNS (S=gent, nitro, vanc, rifamp, tetra) 3/31 urine (from [facility]): >100k Staph epidermidis (S=Macrobib, vanc, linezolid)    Goal of Therapy:  Vancomycin trough level 10-15 mcg/ml Appropriate antibiotic dosing for renal function; eradication of infection  Plan:  - change vancomycin to 1000mg  q12h, next dose due at 2200 - vancomycin trough at steady state if indicated - follow-up clinical course, renal function - follow-up antibiotic de-escalation and length of therapy - follow-up plans for d/c with home hospice  Thank you for the consult.  Johny Drilling, PharmD, BCPS Pager: [phone] Pharmacy: [phone] June 05, 2013 1:50 PM

## 2013-06-18 NOTE — Progress Notes (Signed)
NUTRITION FOLLOW UP Intervention:  -Restart TF per MD discretion/as warranted per family request -Will continue to monitor  Nutrition Dx:   Inadequate oral intake related to difficulty swallowing/decreased appetite as evidenced by PO intake <75%, continued unintentional wt loss - ongoing    Goal:   Pt to meet >/= 90% of their estimated nutrition needs - not met    Monitor:   TF initiation, GI profile, swallow profile, total protein/energy intake, labs, weights  Assessment:   -Pt was d/c'd from [facility] on 3/24  -Since d/c, pt has had minimal intake d/t inability to swallow both liquids and solids  -Pt's wife prepares milkshake with Ensure and Frosty, which pt ate 50%. Wife noted that this was largest amount pt ate for past 7 days  -Has had 0% PO intake since Sunday (3/29)  -Has been to ER/hospitalized several times d/t dehydration  -Pt with occasionally hallucinations and large amount of hip pain-requires frequent turning and repositioning that also contribute to pt's poor PO  -Is bedridden at home. MD noted pt with significant muscle atrophy on extremities  -Dicussed nutrition support with wife. Wife concerned with pt's nutritional deficits, but also worried that pt would pull out any NGT/PEG placement  -Pt not candidate for TPN as he is with functioning gut., inadequate energy intake not related to prolonged nausea, and is not having excessive loose stools  -Low K, Phos/Mag WNL   4/02: -Received verbal consult from pharmacy regarding initiation of TPN -Pt to receive TPN pending placement of PEG -MD recommended TPN vs NGT placement d/t concern for pt's discomfort, however pt still not ideal candidate for TPN as he has functioning gut. Strongly recommend use of trickle feeds with TF s/p PEG placement, and to wean off TPN as TF tolerated -SLP recommended NPO, pt is refusing to maintain 20 degree seated position d/t pain. Will monitor for aspiration upon initiation of TF. SLP also noted white  coating on pt's tongue -PICC placement today (4/02) -Discussed pt at length with pharmacy. Plan to initiate Clinimix 5/15 at 40 ml/hr and advance as tolerated 80 ml/hr to provide 1843 kcal  (92% est kcal needs) and 96 gram protein (100% est protein needs) -Transition to Clinimix 5/20 as tolerated to more sufficiently meet kcal needs.  -Monitor CBG's d/t hx of DM, will likely increase upon initiation of TPN -Monitor refeeding labs d/t prolonged period of suboptimal nutrition, wt loss  4/3 -PEG tube placed yesterday. Received consult for TF management.  -Suspect pt refeeding as phosphorus level went down as TPN started. Potassium also low.  -TF plan and goals discussed with pharmacist and pt's wife.   Patient is receiving TPN with Clinimix E 5/15 @ 40 ml/hr and 20% fat emulsion  @ 10 ml/hr. Provides 1162 kcal, and 48 grams protein per day. Meets 58% minimum estimated energy needs and 51% minimum estimated protein needs.   Potassium low, getting 4 runs of IV KCl  Phosphorus low Magnesium WNL CBGs elevated  Triglycerides elevated   4/06: -TPN was d/c'd on 4/03 -Osmolite 1.5 was initiated on 4/03 at 20 ml/hr - TF was advanced to 60 ml/hr on 4/06 and was receiving Pro-Stat BID. Per RN, pt was experiencing nausea and had been choking on sputum and requiring large amount of suctioning. Residuals were 275cc. MD advised to stop TF -Discussed plan for restarting TF with RN. Family is requesting pt no longer receive nutrition support via PEG. Plan to d/c with hospice  -Receiving Reglan -Elevated CBGS -Phos/Mag WNL (4/04-no  new labs) -K Low  Height: Ht Readings from Last 1 Encounters:  05/01/2013 $RemoveB'5\' 9"'AolAZUmY$  (1.753 m)    Weight Status:   Wt Readings from Last 1 Encounters:  06-21-13 141 lb (63.957 kg)    Re-estimated needs:  Kcal: 2000-2200  Protein: 95-105 gram  Fluid: >/=2200 ml/daily  Skin: WDL  Diet Order: NPO   Intake/Output Summary (Last 24 hours) at Jun 21, 2013 1108 Last data filed  at Jun 21, 2013 0631  Gross per 24 hour  Intake   1160 ml  Output   1650 ml  Net   -490 ml    Last BM: 3/22   Labs:   Recent Labs Lab 05/18/13 0620  05/21/13 0418 05/22/13 0615 05/23/13 0520 06/21/2013 0500  NA 138  < > 133* 134* 132* 134*  K 3.1*  < > 3.4* 4.0 3.5* 3.8  CL 106  < > 98 98 96 99  CO2 22  < > $R'22 22 22 25  'kG$ BUN 18  < > $R'16 18 18 14  'ia$ CREATININE 0.47*  < > 0.41* 0.38* 0.34* 0.32*  CALCIUM 7.8*  < > 8.4 8.5 8.1* 8.0*  MG 1.6  --  1.8 1.7  --   --   PHOS 2.6  --  2.1* 2.5  --   --   GLUCOSE 172*  < > 233* 191* 292* 137*  < > = values in this interval not displayed.  CBG (last 3)   Recent Labs  05/23/13 2354 21-Jun-2013 0417 2013-06-21 0736  GLUCAP 137* 135* 192*    Scheduled Meds: . cloNIDine  0.3 mg Transdermal Weekly  . dexamethasone  12 mg Intravenous Q12H  . feeding supplement (PRO-STAT SUGAR FREE 64)  30 mL Per Tube BID  . fentaNYL  25 mcg Transdermal Q72H  . fluconazole (DIFLUCAN) IV  100 mg Intravenous Q24H  . insulin aspart  0-24 Units Subcutaneous 6 times per day  . insulin glargine  25 Units Subcutaneous QHS  . metoCLOPramide (REGLAN) injection  10 mg Intravenous 4 times per day  . metoprolol tartrate  50 mg Per Tube BID  . scopolamine  1 patch Transdermal Q72H  . sodium chloride  10-40 mL Intracatheter Q12H  . vancomycin  750 mg Intravenous Q8H    Continuous Infusions: . sodium chloride 60 mL/hr at 06/21/2013 1043  . feeding supplement (OSMOLITE 1.5 CAL) Stopped (06/21/13 2820)    Atlee Abide MS RD LDN Clinical Dietitian [phone]

## 2013-06-18 DEATH — deceased

## 2013-10-13 ENCOUNTER — Other Ambulatory Visit: Payer: Self-pay | Admitting: Pharmacist
